# Patient Record
Sex: Male | Born: 1935 | Race: White | Hispanic: No | Marital: Married | Smoking: Former smoker
Health system: Southern US, Community
[De-identification: ages and names within clinical notes are randomized; demographics above are authoritative.]

## PROBLEM LIST (undated history)

## (undated) DIAGNOSIS — I472 Ventricular tachycardia: Secondary | ICD-10-CM

## (undated) DIAGNOSIS — F419 Anxiety disorder, unspecified: Secondary | ICD-10-CM

## (undated) DIAGNOSIS — I739 Peripheral vascular disease, unspecified: Secondary | ICD-10-CM

## (undated) DIAGNOSIS — R0602 Shortness of breath: Secondary | ICD-10-CM

## (undated) DIAGNOSIS — I4729 Other ventricular tachycardia: Secondary | ICD-10-CM

## (undated) DIAGNOSIS — Z9981 Dependence on supplemental oxygen: Secondary | ICD-10-CM

## (undated) DIAGNOSIS — F32A Depression, unspecified: Secondary | ICD-10-CM

## (undated) DIAGNOSIS — N183 Chronic kidney disease, stage 3 unspecified: Secondary | ICD-10-CM

## (undated) DIAGNOSIS — I10 Essential (primary) hypertension: Secondary | ICD-10-CM

## (undated) DIAGNOSIS — I2510 Atherosclerotic heart disease of native coronary artery without angina pectoris: Secondary | ICD-10-CM

## (undated) DIAGNOSIS — K219 Gastro-esophageal reflux disease without esophagitis: Secondary | ICD-10-CM

## (undated) DIAGNOSIS — E785 Hyperlipidemia, unspecified: Secondary | ICD-10-CM

## (undated) DIAGNOSIS — I639 Cerebral infarction, unspecified: Secondary | ICD-10-CM

## (undated) DIAGNOSIS — J449 Chronic obstructive pulmonary disease, unspecified: Secondary | ICD-10-CM

## (undated) DIAGNOSIS — Z9289 Personal history of other medical treatment: Secondary | ICD-10-CM

## (undated) DIAGNOSIS — J189 Pneumonia, unspecified organism: Secondary | ICD-10-CM

## (undated) DIAGNOSIS — K279 Peptic ulcer, site unspecified, unspecified as acute or chronic, without hemorrhage or perforation: Secondary | ICD-10-CM

## (undated) DIAGNOSIS — R55 Syncope and collapse: Secondary | ICD-10-CM

## (undated) DIAGNOSIS — K259 Gastric ulcer, unspecified as acute or chronic, without hemorrhage or perforation: Secondary | ICD-10-CM

## (undated) DIAGNOSIS — J9610 Chronic respiratory failure, unspecified whether with hypoxia or hypercapnia: Secondary | ICD-10-CM

## (undated) DIAGNOSIS — F329 Major depressive disorder, single episode, unspecified: Secondary | ICD-10-CM

## (undated) DIAGNOSIS — M1990 Unspecified osteoarthritis, unspecified site: Secondary | ICD-10-CM

## (undated) HISTORY — PX: EXCISIONAL HEMORRHOIDECTOMY: SHX1541

## (undated) HISTORY — DX: Cerebral infarction, unspecified: I63.9

## (undated) HISTORY — PX: CORONARY ANGIOPLASTY WITH STENT PLACEMENT: SHX49

## (undated) HISTORY — PX: TRANSURETHRAL RESECTION OF PROSTATE: SHX73

## (undated) HISTORY — PX: TONSILLECTOMY: SUR1361

## (undated) HISTORY — PX: CATARACT EXTRACTION W/ INTRAOCULAR LENS  IMPLANT, BILATERAL: SHX1307

## (undated) HISTORY — PX: APPENDECTOMY: SHX54

## (undated) HISTORY — PX: GASTRECTOMY: SHX58

---

## 1998-03-15 ENCOUNTER — Ambulatory Visit (HOSPITAL_COMMUNITY): Admission: RE | Admit: 1998-03-15 | Discharge: 1998-03-15 | Payer: Self-pay | Admitting: Internal Medicine

## 2001-02-04 ENCOUNTER — Ambulatory Visit (HOSPITAL_COMMUNITY): Admission: RE | Admit: 2001-02-04 | Discharge: 2001-02-04 | Payer: Self-pay | Admitting: *Deleted

## 2001-04-27 ENCOUNTER — Ambulatory Visit: Admission: RE | Admit: 2001-04-27 | Discharge: 2001-04-27 | Payer: Self-pay | Admitting: Pulmonary Disease

## 2001-04-27 LAB — PULMONARY FUNCTION TEST

## 2001-05-14 ENCOUNTER — Encounter: Payer: Self-pay | Admitting: Pulmonary Disease

## 2001-05-14 ENCOUNTER — Ambulatory Visit (HOSPITAL_COMMUNITY): Admission: RE | Admit: 2001-05-14 | Discharge: 2001-05-14 | Payer: Self-pay | Admitting: Pulmonary Disease

## 2002-05-13 ENCOUNTER — Emergency Department (HOSPITAL_COMMUNITY): Admission: EM | Admit: 2002-05-13 | Discharge: 2002-05-13 | Payer: Self-pay | Admitting: Emergency Medicine

## 2003-02-23 ENCOUNTER — Encounter: Payer: Self-pay | Admitting: Emergency Medicine

## 2003-02-23 ENCOUNTER — Emergency Department (HOSPITAL_COMMUNITY): Admission: EM | Admit: 2003-02-23 | Discharge: 2003-02-23 | Payer: Self-pay | Admitting: Emergency Medicine

## 2003-03-21 ENCOUNTER — Encounter: Admission: RE | Admit: 2003-03-21 | Discharge: 2003-03-21 | Payer: Self-pay | Admitting: Internal Medicine

## 2003-03-21 ENCOUNTER — Encounter: Payer: Self-pay | Admitting: Internal Medicine

## 2003-04-14 ENCOUNTER — Encounter: Payer: Self-pay | Admitting: Internal Medicine

## 2003-04-14 ENCOUNTER — Encounter: Admission: RE | Admit: 2003-04-14 | Discharge: 2003-04-14 | Payer: Self-pay | Admitting: Internal Medicine

## 2003-11-13 ENCOUNTER — Ambulatory Visit (HOSPITAL_COMMUNITY): Admission: RE | Admit: 2003-11-13 | Discharge: 2003-11-13 | Payer: Self-pay | Admitting: Gastroenterology

## 2004-02-23 ENCOUNTER — Encounter: Admission: RE | Admit: 2004-02-23 | Discharge: 2004-02-23 | Payer: Self-pay | Admitting: Neurosurgery

## 2004-03-08 ENCOUNTER — Emergency Department (HOSPITAL_COMMUNITY): Admission: EM | Admit: 2004-03-08 | Discharge: 2004-03-08 | Payer: Self-pay | Admitting: Emergency Medicine

## 2004-04-04 ENCOUNTER — Ambulatory Visit (HOSPITAL_COMMUNITY): Admission: RE | Admit: 2004-04-04 | Discharge: 2004-04-04 | Payer: Self-pay | Admitting: General Surgery

## 2004-04-06 ENCOUNTER — Emergency Department (HOSPITAL_COMMUNITY): Admission: EM | Admit: 2004-04-06 | Discharge: 2004-04-06 | Payer: Self-pay | Admitting: *Deleted

## 2004-04-19 ENCOUNTER — Encounter: Admission: RE | Admit: 2004-04-19 | Discharge: 2004-04-19 | Payer: Self-pay | Admitting: Neurosurgery

## 2004-04-24 ENCOUNTER — Inpatient Hospital Stay (HOSPITAL_COMMUNITY): Admission: RE | Admit: 2004-04-24 | Discharge: 2004-04-28 | Payer: Self-pay | Admitting: Urology

## 2005-12-12 ENCOUNTER — Encounter: Admission: RE | Admit: 2005-12-12 | Discharge: 2005-12-12 | Payer: Self-pay | Admitting: Internal Medicine

## 2005-12-27 ENCOUNTER — Encounter: Admission: RE | Admit: 2005-12-27 | Discharge: 2005-12-27 | Payer: Self-pay | Admitting: Internal Medicine

## 2006-05-12 ENCOUNTER — Emergency Department (HOSPITAL_COMMUNITY): Admission: EM | Admit: 2006-05-12 | Discharge: 2006-05-12 | Payer: Self-pay | Admitting: Emergency Medicine

## 2006-09-21 ENCOUNTER — Encounter: Admission: RE | Admit: 2006-09-21 | Discharge: 2006-09-21 | Payer: Self-pay | Admitting: Internal Medicine

## 2010-09-08 ENCOUNTER — Encounter: Payer: Self-pay | Admitting: Neurosurgery

## 2011-07-25 ENCOUNTER — Other Ambulatory Visit: Payer: Self-pay

## 2011-07-25 ENCOUNTER — Emergency Department (HOSPITAL_COMMUNITY): Payer: Medicare Other

## 2011-07-25 ENCOUNTER — Encounter: Payer: Self-pay | Admitting: Emergency Medicine

## 2011-07-25 ENCOUNTER — Inpatient Hospital Stay (HOSPITAL_COMMUNITY)
Admission: EM | Admit: 2011-07-25 | Discharge: 2011-08-06 | DRG: 871 | Disposition: A | Discharge status: Skilled Nursing Facility | Payer: Medicare Other | Attending: Internal Medicine | Admitting: Internal Medicine

## 2011-07-25 DIAGNOSIS — R6889 Other general symptoms and signs: Secondary | ICD-10-CM | POA: Diagnosis present

## 2011-07-25 DIAGNOSIS — I2510 Atherosclerotic heart disease of native coronary artery without angina pectoris: Secondary | ICD-10-CM | POA: Diagnosis present

## 2011-07-25 DIAGNOSIS — R5381 Other malaise: Secondary | ICD-10-CM | POA: Diagnosis present

## 2011-07-25 DIAGNOSIS — R55 Syncope and collapse: Secondary | ICD-10-CM | POA: Diagnosis present

## 2011-07-25 DIAGNOSIS — A3211 Listerial meningitis: Secondary | ICD-10-CM | POA: Diagnosis not present

## 2011-07-25 DIAGNOSIS — G9340 Encephalopathy, unspecified: Secondary | ICD-10-CM | POA: Diagnosis present

## 2011-07-25 DIAGNOSIS — I10 Essential (primary) hypertension: Secondary | ICD-10-CM | POA: Diagnosis present

## 2011-07-25 DIAGNOSIS — Z781 Physical restraint status: Secondary | ICD-10-CM | POA: Diagnosis present

## 2011-07-25 DIAGNOSIS — A419 Sepsis, unspecified organism: Principal | ICD-10-CM | POA: Diagnosis present

## 2011-07-25 DIAGNOSIS — R748 Abnormal levels of other serum enzymes: Secondary | ICD-10-CM

## 2011-07-25 DIAGNOSIS — R509 Fever, unspecified: Secondary | ICD-10-CM

## 2011-07-25 DIAGNOSIS — J449 Chronic obstructive pulmonary disease, unspecified: Secondary | ICD-10-CM | POA: Diagnosis present

## 2011-07-25 DIAGNOSIS — R4182 Altered mental status, unspecified: Secondary | ICD-10-CM

## 2011-07-25 DIAGNOSIS — Z79899 Other long term (current) drug therapy: Secondary | ICD-10-CM

## 2011-07-25 DIAGNOSIS — Z681 Body mass index (BMI) 19 or less, adult: Secondary | ICD-10-CM

## 2011-07-25 DIAGNOSIS — Z8711 Personal history of peptic ulcer disease: Secondary | ICD-10-CM

## 2011-07-25 DIAGNOSIS — Z66 Do not resuscitate: Secondary | ICD-10-CM | POA: Diagnosis present

## 2011-07-25 DIAGNOSIS — E876 Hypokalemia: Secondary | ICD-10-CM

## 2011-07-25 DIAGNOSIS — I214 Non-ST elevation (NSTEMI) myocardial infarction: Secondary | ICD-10-CM | POA: Diagnosis present

## 2011-07-25 DIAGNOSIS — G009 Bacterial meningitis, unspecified: Secondary | ICD-10-CM | POA: Diagnosis present

## 2011-07-25 DIAGNOSIS — R64 Cachexia: Secondary | ICD-10-CM | POA: Diagnosis present

## 2011-07-25 DIAGNOSIS — R6510 Systemic inflammatory response syndrome (SIRS) of non-infectious origin without acute organ dysfunction: Secondary | ICD-10-CM | POA: Diagnosis present

## 2011-07-25 DIAGNOSIS — IMO0002 Reserved for concepts with insufficient information to code with codable children: Secondary | ICD-10-CM | POA: Diagnosis present

## 2011-07-25 DIAGNOSIS — Z7982 Long term (current) use of aspirin: Secondary | ICD-10-CM

## 2011-07-25 DIAGNOSIS — J4489 Other specified chronic obstructive pulmonary disease: Secondary | ICD-10-CM | POA: Diagnosis present

## 2011-07-25 HISTORY — DX: Peptic ulcer, site unspecified, unspecified as acute or chronic, without hemorrhage or perforation: K27.9

## 2011-07-25 HISTORY — DX: Syncope and collapse: R55

## 2011-07-25 HISTORY — DX: Essential (primary) hypertension: I10

## 2011-07-25 HISTORY — DX: Atherosclerotic heart disease of native coronary artery without angina pectoris: I25.10

## 2011-07-25 HISTORY — DX: Chronic obstructive pulmonary disease, unspecified: J44.9

## 2011-07-25 LAB — URINALYSIS, ROUTINE W REFLEX MICROSCOPIC
Bilirubin Urine: NEGATIVE
Glucose, UA: NEGATIVE mg/dL
Specific Gravity, Urine: 1.022 (ref 1.005–1.030)
Urobilinogen, UA: 0.2 mg/dL (ref 0.0–1.0)

## 2011-07-25 LAB — LIPASE, BLOOD: Lipase: 15 U/L (ref 11–59)

## 2011-07-25 LAB — CBC
Hemoglobin: 11.5 g/dL — ABNORMAL LOW (ref 13.0–17.0)
MCH: 31.1 pg (ref 26.0–34.0)
MCV: 93.8 fL (ref 78.0–100.0)
WBC: 16.9 10*3/uL — ABNORMAL HIGH (ref 4.0–10.5)

## 2011-07-25 LAB — COMPREHENSIVE METABOLIC PANEL
Alkaline Phosphatase: 54 U/L (ref 39–117)
BUN: 22 mg/dL (ref 6–23)
Chloride: 97 mEq/L (ref 96–112)
Creatinine, Ser: 1.23 mg/dL (ref 0.50–1.35)
GFR calc Af Amer: 62 mL/min — ABNORMAL LOW (ref >90)
GFR calc non Af Amer: 53 mL/min — ABNORMAL LOW (ref >90)
Sodium: 132 mEq/L — ABNORMAL LOW (ref 135–145)
Total Protein: 7.1 g/dL (ref 6.0–8.3)

## 2011-07-25 LAB — URINE MICROSCOPIC-ADD ON

## 2011-07-25 MED ORDER — PIPERACILLIN-TAZOBACTAM 3.375 G IVPB 30 MIN
3.3750 g | Freq: Once | INTRAVENOUS | Status: AC
  Administered 2011-07-25: 3.375 g via INTRAVENOUS
  Filled 2011-07-25: qty 50

## 2011-07-25 MED ORDER — VANCOMYCIN HCL IN DEXTROSE 1-5 GM/200ML-% IV SOLN
1000.0000 mg | Freq: Once | INTRAVENOUS | Status: AC
  Administered 2011-07-25: 1000 mg via INTRAVENOUS
  Filled 2011-07-25: qty 200

## 2011-07-25 MED ORDER — LIDOCAINE HCL 1 % IJ SOLN
INTRAMUSCULAR | Status: AC
  Filled 2011-07-25: qty 20

## 2011-07-25 MED ORDER — POTASSIUM CHLORIDE 10 MEQ/100ML IV SOLN
10.0000 meq | INTRAVENOUS | Status: AC
  Administered 2011-07-25 (×3): 10 meq via INTRAVENOUS
  Filled 2011-07-25 (×3): qty 100

## 2011-07-25 MED ORDER — LIDOCAINE HCL (PF) 1 % IJ SOLN
30.0000 mL | Freq: Once | INTRAMUSCULAR | Status: AC
  Administered 2011-07-26: 30 mL via INTRADERMAL
  Filled 2011-07-25: qty 30

## 2011-07-25 MED ORDER — PIPERACILLIN-TAZOBACTAM 3.375 G IVPB: 3.3750 g | Freq: Once | INTRAVENOUS | Status: DC

## 2011-07-25 MED ORDER — ACETAMINOPHEN 650 MG RE SUPP
650.0000 mg | Freq: Once | RECTAL | Status: AC
  Administered 2011-07-25: 650 mg via RECTAL
  Filled 2011-07-25: qty 1

## 2011-07-25 MED ORDER — SODIUM CHLORIDE 0.9 % IV SOLN: Freq: Once | INTRAVENOUS | Status: DC

## 2011-07-25 MED ORDER — ASPIRIN 300 MG RE SUPP
300.0000 mg | Freq: Once | RECTAL | Status: AC
  Administered 2011-07-25: 300 mg via RECTAL
  Filled 2011-07-25: qty 1

## 2011-07-25 MED ORDER — SODIUM CHLORIDE 0.9 % IV BOLUS (SEPSIS)
1000.0000 mL | Freq: Once | INTRAVENOUS | Status: AC
  Administered 2011-07-25: 1000 mL via INTRAVENOUS

## 2011-07-25 NOTE — ED Provider Notes (Addendum)
History     CSN: [id number] Arrival date & time: 07/25/2011  5:01 PM   First MD Initiated Contact with Patient 07/25/11 1733      Chief Complaint  Patient presents with  . Altered Mental Status    (Consider location/radiation/quality/duration/timing/severity/associated sxs/prior treatment) The history is provided by a relative. The history is limited by the condition of the patient.   patient was brought by his daughter for increasing confusion over the last 3-4 days.  Normally the patient is alert and oriented and able to hold a normal conversation and he lives at home with his wife.  As reported several falls today and increasing confusion as noted by the daughter.  They did report a fever and chills for several days as well.  He's not been out of bed for approximately 3-4 days.  The patient does not abuse alcohol per family.  The patient has a cardiac history with stents in his heart per family.  The patient is unable to provide any history given his degree of confusion and combativeness  Past Medical History  Diagnosis Date  . COPD (chronic obstructive pulmonary disease)   . History of heart surgery     Past Surgical History  Procedure Date  . Cataract extraction   . Abdominal surgery     No family history on file.  History  Substance Use Topics  . Smoking status: Former Games developer  . Smokeless tobacco: Not on file  . Alcohol Use: No      Review of Systems  Unable to perform ROS Psychiatric/Behavioral: Positive for altered mental status.    Allergies  Review of patient's allergies indicates no known allergies.  Home Medications  No current outpatient prescriptions on file.  BP 165/80  Pulse 92  Temp(Src) 102.5 F (39.2 C) (Rectal)  Resp 20  SpO2 91%  Physical Exam  Nursing note and vitals reviewed. Constitutional: He appears well-developed.       Febrile to 104.2 rectally  HENT:  Head: Normocephalic and atraumatic.  Eyes: EOM are normal.  Neck: Normal  range of motion.  Cardiovascular: Normal rate, regular rhythm, normal heart sounds and intact distal pulses.   Pulmonary/Chest: Effort normal and breath sounds normal. No respiratory distress.  Abdominal: Soft. He exhibits no distension.  Genitourinary: Penis normal.  Musculoskeletal: Normal range of motion.  Neurological:       Alert.  Combative.   Does follow simple commands. Normal strength bilaterally in both upper and lower extremities grossly. Unable to perform additional neuro testing given AMS  Skin: Skin is warm and dry.  Psychiatric: He has a normal mood and affect. Judgment normal.    ED Course  LUMBAR PUNCTURE Date/Time: 07/25/2011 11:58 PM Performed by: Lyanne Co Authorized by: Lyanne Co Consent: Verbal consent obtained. Risks and benefits: risks, benefits and alternatives were discussed Consent given by: power of attorney Required items: required blood products, implants, devices, and special equipment available Patient identity confirmed: arm band and hospital-assigned identification number Time out: Immediately prior to procedure a "time out" was called to verify the correct patient, procedure, equipment, support staff and site/side marked as required. Indications: evaluation for infection and evaluation for altered mental status Anesthesia: local infiltration Local anesthetic: lidocaine 1% without epinephrine Anesthetic total: 5 ml Patient sedated: no Lumbar space: L3-L4 interspace Patient's position: left lateral decubitus Needle gauge: 20 Number of attempts: 2 Fluid appearance: clear Tubes of fluid: 4 Total volume: 4 ml Post-procedure: site cleaned Patient tolerance: Patient tolerated the procedure  well with no immediate complications.   (including critical care time)  CRITICAL CARE Performed by: Lyanne Co Total critical care time: 30 Critical care time was exclusive of separately billable procedures and treating other patients. Critical  care was necessary to treat or prevent imminent or life-threatening deterioration. Critical care was time spent personally by me on the following activities: development of treatment plan with patient and/or surrogate as well as nursing, discussions with consultants, evaluation of patient's response to treatment, examination of patient, obtaining history from patient or surrogate, ordering and performing treatments and interventions, ordering and review of laboratory studies, ordering and review of radiographic studies, pulse oximetry and re-evaluation of patient's condition.    Date: 07/25/2011  Rate: 86  Rhythm: normal sinus rhythm  QRS Axis: normal  Intervals: normal  ST/T Wave abnormalities: normal  Conduction Disutrbances:poor baseline, PVC noted  Narrative Interpretation: artifact present with poor baseline  Old EKG Reviewed: nonspecific changes from prior     Labs Reviewed  CBC - Abnormal; Notable for the following:    WBC 16.9 (*)    RBC 3.70 (*)    Hemoglobin 11.5 (*)    HCT 34.7 (*)    Platelets 143 (*)    All other components within normal limits  COMPREHENSIVE METABOLIC PANEL - Abnormal; Notable for the following:    Sodium 132 (*)    Potassium 2.5 (*)    Glucose, Bld 122 (*)    Albumin 3.3 (*)    AST 80 (*)    GFR calc non Af Amer 53 (*)    GFR calc Af Amer 62 (*)    All other components within normal limits  LACTIC ACID, PLASMA - Abnormal; Notable for the following:    Lactic Acid, Venous 2.3 (*)    All other components within normal limits  URINALYSIS, ROUTINE W REFLEX MICROSCOPIC - Abnormal; Notable for the following:    APPearance CLOUDY (*)    Hgb urine dipstick LARGE (*)    Ketones, ur TRACE (*)    Protein, ur 100 (*)    All other components within normal limits  TROPONIN I - Abnormal; Notable for the following:    Troponin I 1.05 (*)    All other components within normal limits  URINE MICROSCOPIC-ADD ON - Abnormal; Notable for the following:    Casts  GRANULAR CAST (*)    All other components within normal limits  LIPASE, BLOOD  PROCALCITONIN  CULTURE, BLOOD (ROUTINE X 2)  CULTURE, BLOOD (ROUTINE X 2)  URINE CULTURE  INFLUENZA PANEL BY PCR   Ct Head Wo Contrast  07/25/2011  *RADIOLOGY REPORT*  Clinical Data: Altered mental status, multiple falls  CT HEAD WITHOUT CONTRAST  Technique:  Contiguous axial images were obtained from the base of the skull through the vertex without contrast.  Comparison: [facility] MRI brain dated 12/27/2005  Findings: Motion degraded images.  No evidence of parenchymal hemorrhage or extra-axial fluid collection. No mass lesion, mass effect, or midline shift.  No CT evidence of acute infarction.  Subcortical white matter and periventricular small vessel ischemic changes.  Age related atrophy. No ventriculomegaly.  The visualized paranasal sinuses are essentially clear. The mastoid air cells are unopacified.  No evidence of calvarial fracture.  IMPRESSION: Motion degraded images.  No evidence of acute intracranial abnormality.  Atrophy with small vessel ischemic changes.  Original Report Authenticated By: Charline Bills, M.D.   Dg Chest Portable 1 View  07/25/2011  *RADIOLOGY REPORT*  Clinical Data: Fever.  Altered  mental status.  PORTABLE CHEST - 1 VIEW  Comparison: 09/21/2006  Findings: Changes of COPD are again noted.  No evidence of acute infiltrate or pleural effusion.  Heart size is normal.  IMPRESSION: COPD.  No acute findings.  Original Report Authenticated By: Danae Orleans, M.D.   I personally reviewed the x-rays  1. Fever   2. Altered mental status   3. Hypokalemia   4. Cardiac enzymes elevated       MDM  Patient presents with a fever off and 4.2.  At this time but not able to find a source.  His vital signs are normal.  His chest x-ray and urine are clear.  The suspicion for this being meningitis is very low as his mental status is improving with antipyretics.  His range of motion of his  neck and no obvious meningeal signs at this time.  CT head is grossly normal.  We'll admit for further evaluation.  Thank and Zosyn given early in the patient's course.  Blood and urine cultures obtained.  The patient does have severe hypokalemia being repleted via IV at this time.  He also has a troponin 1 which is likely a type II myocardial infarction secondary to his infection.  Rectal aspirin has been given.  His EKG is without acute ischemic findings   8:56 PM Spoke with Dr Haroldine Laws who will evaluate the patient in the ER  LP requested by hospitalist after evaluation in the ER.   Lyanne Co, MD 07/25/11 2028  Lyanne Co, MD 07/25/11 1610  Lyanne Co, MD 07/25/11 [phone]

## 2011-07-25 NOTE — H&P (Addendum)
PCP:   Dr. Arna Snipe  Chief Complaint:  Loss of conscious  HPI: This 77 year old gentleman, who has been in bed for the past 3 days with nonspecific illness, today he got up went to the bathroom and passed out. His wife was present, no report of seizures. He laid down for for a while, finally got up to go to the bedroom and they to pass out again. Both times he had glasses in his hands, they broke he sustained bodily abrasions. The patient at that time did not want 911 known to be called. By the time his daughter came home at 4PM, she found her father  confused and talking gibberish. He complained of freezing chills and was very agitated and confused. En route to the hospital the patient tried climbing out of the car window. In the ER the patient's temperature was as high as 104.2 with no clear source. His temperature has improved, he is more oriented but remains weak and spent.   The daughter who provides most of the history reports that patient has had a 40 pound weight loss over the past year  and anorexia. His last colonoscopy was in 2005. He is also been getting progressively weaker. Last week he did have a cough, which seemed to resolve.   The patient does states he had chest pains and states it is not reproducible. Most history obtained from daughter Rosey Bath, patient able to fit in some gaps.   Review of Systems:  positives bolded  The patient denies anorexia, fever, weight loss,, vision loss, decreased hearing, hoarseness, chest pain, syncope, dyspnea on exertion, peripheral edema, balance deficits, hemoptysis, abdominal pain, melena, hematochezia, severe indigestion/heartburn, hematuria, incontinence, genital sores, muscle weakness, suspicious skin lesions, transient blindness, difficulty walking, depression, unusual weight change, abnormal bleeding, enlarged lymph nodes, angioedema, and breast masses.  Past Medical History: Past Medical History  Diagnosis Date  . COPD (chronic  obstructive pulmonary disease)   . CAD (coronary artery disease)   . HTN (hypertension)   . PUD (peptic ulcer disease)    Past Surgical History  Procedure Date  . Cataract extraction   . Gastrectomy   . Coronary angioplasty with stent placement   . Transurethral resection of prostate     Medications: Prior to Admission medications   Medication Sig Start Date End Date Taking? Authorizing Provider  albuterol-ipratropium (COMBIVENT) 18-103 MCG/ACT inhaler Inhale 2 puffs into the lungs every 6 (six) hours as needed.     Yes Historical Provider, MD  aspirin 81 MG tablet Take 81 mg by mouth daily.     Yes Historical Provider, MD  telmisartan (MICARDIS) 80 MG tablet Take 80 mg by mouth daily.     Yes Historical Provider, MD  temazepam (RESTORIL) 30 MG capsule Take 30 mg by mouth at bedtime as needed.     Yes Historical Provider, MD  tiotropium (SPIRIVA) 18 MCG inhalation capsule Place 18 mcg into inhaler and inhale daily.     Yes Historical Provider, MD    Allergies:  No Known Allergies  Social History:  reports that he has quit smoking. He does not have any smokeless tobacco history on file. He reports that he does not drink alcohol or use illicit drugs. lives at home with wife  Family History: Family History  Problem Relation Age of Onset  . Hypertension    . Coronary artery disease      Physical Exam: Filed Vitals:   07/25/11 1711 07/25/11 1752 07/25/11 1955  BP: 144/101  165/80  Pulse: 86 98 92  Temp: 99.7 F (37.6 C) 104.2 F (40.1 C) 102.5 F (39.2 C)  TempSrc: Oral Rectal Rectal  Resp: 20 34 20  SpO2: 94%  91%    General:  Alert and oriented times three, well developed and nourished, no acute distress weak Eyes: PERRLA, pink conjunctiva, no scleral icterus ENT:dry oral mucosa, neck supple, no thyromegaly Lungs: clear to ascultation, no wheeze, no crackles, no use of accessory muscles Cardiovascular: regular rate and rhythm, no regurgitation, no gallops, no  murmurs. No carotid bruits, no JVD Abdomen: soft, positive BS, non-tender, non-distended, no organomegaly, not an acute abdomen GU: not examined Neuro: CN II - XII grossly intact, sensation intact Musculoskeletal: strength 5/5 all extremities, no clubbing, cyanosis or edema Skin: no rash, no subcutaneous crepitation, no decubitus, abrasions and chest wall elbow and torso Psych: appropriate patient   Labs on Admission:   [facility] 07/25/11 1833  NA 132*  K 2.5*  CL 97  CO2 21  GLUCOSE 122*  BUN 22  CREATININE 1.23  CALCIUM 9.2  MG --  PHOS --    Basename 07/25/11 1833  AST 80*  ALT 22  ALKPHOS 54  BILITOT 0.4  PROT 7.1  ALBUMIN 3.3*    Basename 07/25/11 1833  LIPASE 15  AMYLASE --    Basename 07/25/11 1833  WBC 16.9*  NEUTROABS --  HGB 11.5*  HCT 34.7*  MCV 93.8  PLT 143*    Basename 07/25/11 1832  CKTOTAL --  CKMB --  CKMBINDEX --  TROPONINI 1.05*   No results found for this basename: TSH,T4TOTAL,FREET3,T3FREE,THYROIDAB in the last 72 hours No results found for this basename: VITAMINB12:2,FOLATE:2,FERRITIN:2,TIBC:2,IRON:2,RETICCTPCT:2 in the last 72 hours  Radiological Exams on Admission: Ct Head Wo Contrast  07/25/2011  *RADIOLOGY REPORT*  Clinical Data: Altered mental status, multiple falls  CT HEAD WITHOUT CONTRAST  Technique:  Contiguous axial images were obtained from the base of the skull through the vertex without contrast.  Comparison: [facility] MRI brain dated 12/27/2005  Findings: Motion degraded images.  No evidence of parenchymal hemorrhage or extra-axial fluid collection. No mass lesion, mass effect, or midline shift.  No CT evidence of acute infarction.  Subcortical white matter and periventricular small vessel ischemic changes.  Age related atrophy. No ventriculomegaly.  The visualized paranasal sinuses are essentially clear. The mastoid air cells are unopacified.  No evidence of calvarial fracture.  IMPRESSION: Motion degraded images.   No evidence of acute intracranial abnormality.  Atrophy with small vessel ischemic changes.  Original Report Authenticated By: Charline Bills, M.D.   Dg Chest Portable 1 View  07/25/2011  *RADIOLOGY REPORT*  Clinical Data: Fever.  Altered mental status.  PORTABLE CHEST - 1 VIEW  Comparison: 09/21/2006  Findings: Changes of COPD are again noted.  No evidence of acute infiltrate or pleural effusion.  Heart size is normal.  IMPRESSION: COPD.  No acute findings.  Original Report Authenticated By: Danae Orleans, M.D.   EKG; almost sinus rhythm, nonspecific ST segment depression  Assessment/Plan Present on Admission:  .SIRS (systemic inflammatory response syndrome) Encephalopathy  Patient will be admitted, no clear cause for infection identified. Rapid flu ordered, an LP will be done  Blood cultures urine cultures and chest x-ray ordered Antibiotics ordered IV fluid hydration  Patient encephalopathy appears to be improving, may have been due to the elevated temperature  .Elevated troponin Admit to telemetry  Patient with nonspecific ST segment depression, he does complain of some left-sided chest pains and  history of cardiac disease  We'll cycle cardiac enzymes  Aspirin, Lovenox DVT prophylaxis and lipid panel in a.m. ordered  Prophylactic dose DVT as patient will receive an LP in the ER. Marland KitchenSyncope and collapse Likely due to patient's illness, monitor Hypokalemia Check magnesium level Replete with IV fluids Weight loss Tumor markers ordered. Patient has a history of elevated PSA and TURP Check prealbumin   DO NOT RESUSCITATE DVT/GI prophylaxis Team 5/Dr. Brien Few    time in; 9:25 p.m.  Time out ; 10:02 PM   Kynlie Jane 07/25/2011, 9:46 PM

## 2011-07-25 NOTE — ED Notes (Signed)
Pt presenting to ed with c/o altered mental status per daughter at bedside. Per family at bedside pt is normally alert and oriented. Per family pt with multiple falls today and lacerations. Pt is disoriented.

## 2011-07-26 ENCOUNTER — Encounter (HOSPITAL_COMMUNITY): Payer: Self-pay | Admitting: Internal Medicine

## 2011-07-26 DIAGNOSIS — A3211 Listerial meningitis: Secondary | ICD-10-CM | POA: Diagnosis not present

## 2011-07-26 DIAGNOSIS — G009 Bacterial meningitis, unspecified: Secondary | ICD-10-CM

## 2011-07-26 DIAGNOSIS — I214 Non-ST elevation (NSTEMI) myocardial infarction: Secondary | ICD-10-CM

## 2011-07-26 LAB — CSF CELL COUNT WITH DIFFERENTIAL
Eosinophils, CSF: 0 % (ref 0–1)
Segmented Neutrophils-CSF: 94 % — ABNORMAL HIGH (ref 0–6)
WBC, CSF: 641 /mm3 (ref 0–5)

## 2011-07-26 LAB — INFLUENZA PANEL BY PCR (TYPE A & B): H1N1 flu by pcr: NOT DETECTED

## 2011-07-26 LAB — BASIC METABOLIC PANEL
BUN: 21 mg/dL (ref 6–23)
CO2: 18 mEq/L — ABNORMAL LOW (ref 19–32)
Chloride: 101 mEq/L (ref 96–112)
Glucose, Bld: 108 mg/dL — ABNORMAL HIGH (ref 70–99)
Potassium: 2.8 mEq/L — ABNORMAL LOW (ref 3.5–5.1)

## 2011-07-26 MED ORDER — ONDANSETRON HCL 4 MG/2ML IJ SOLN: 4.0000 mg | Freq: Four times a day (QID) | INTRAMUSCULAR | Status: DC | PRN

## 2011-07-26 MED ORDER — POTASSIUM CHLORIDE CRYS ER 20 MEQ PO TBCR: 40.0000 meq | EXTENDED_RELEASE_TABLET | Freq: Once | ORAL | Status: DC

## 2011-07-26 MED ORDER — LORAZEPAM 2 MG/ML IJ SOLN
INTRAMUSCULAR | Status: AC
  Administered 2011-07-26: 1 mg via INTRAVENOUS
  Filled 2011-07-26: qty 1

## 2011-07-26 MED ORDER — ONDANSETRON HCL 4 MG PO TABS: 4.0000 mg | ORAL_TABLET | Freq: Four times a day (QID) | ORAL | Status: DC | PRN

## 2011-07-26 MED ORDER — LORAZEPAM 2 MG/ML IJ SOLN
1.0000 mg | INTRAMUSCULAR | Status: DC | PRN
  Administered 2011-07-26 – 2011-08-03 (×6): 1 mg via INTRAVENOUS
  Filled 2011-07-26 (×5): qty 1

## 2011-07-26 MED ORDER — POTASSIUM CHLORIDE IN NACL 20-0.9 MEQ/L-% IV SOLN
INTRAVENOUS | Status: DC
  Administered 2011-07-26 – 2011-08-06 (×16): via INTRAVENOUS
  Filled 2011-07-26 (×23): qty 1000

## 2011-07-26 MED ORDER — DEXTROSE 5 % IV SOLN
2.0000 g | Freq: Two times a day (BID) | INTRAVENOUS | Status: DC
  Administered 2011-07-26 – 2011-07-28 (×5): 2 g via INTRAVENOUS
  Filled 2011-07-26 (×7): qty 2

## 2011-07-26 MED ORDER — DEXAMETHASONE SODIUM PHOSPHATE 10 MG/ML IJ SOLN
10.0000 mg | Freq: Four times a day (QID) | INTRAMUSCULAR | Status: DC
  Administered 2011-07-26 – 2011-07-28 (×8): 10 mg via INTRAVENOUS
  Filled 2011-07-26 (×15): qty 1

## 2011-07-26 MED ORDER — MORPHINE SULFATE 2 MG/ML IJ SOLN: 1.0000 mg | INTRAMUSCULAR | Status: DC | PRN

## 2011-07-26 MED ORDER — ASPIRIN EC 81 MG PO TBEC
81.0000 mg | DELAYED_RELEASE_TABLET | Freq: Every day | ORAL | Status: DC
  Filled 2011-07-26: qty 1

## 2011-07-26 MED ORDER — ACETAMINOPHEN 650 MG RE SUPP
650.0000 mg | Freq: Four times a day (QID) | RECTAL | Status: DC | PRN
  Administered 2011-07-26 – 2011-07-27 (×3): 650 mg via RECTAL
  Filled 2011-07-26 (×3): qty 1

## 2011-07-26 MED ORDER — ALUM & MAG HYDROXIDE-SIMETH 200-200-20 MG/5ML PO SUSP: 30.0000 mL | Freq: Four times a day (QID) | ORAL | Status: DC | PRN

## 2011-07-26 MED ORDER — POTASSIUM CHLORIDE 10 MEQ/100ML IV SOLN
10.0000 meq | INTRAVENOUS | Status: AC
  Administered 2011-07-26 (×3): 10 meq via INTRAVENOUS
  Filled 2011-07-26 (×2): qty 100

## 2011-07-26 MED ORDER — SODIUM CHLORIDE 0.9 % IV SOLN
2.0000 g | INTRAVENOUS | Status: DC
  Administered 2011-07-26 – 2011-08-06 (×65): 2 g via INTRAVENOUS
  Filled 2011-07-26 (×76): qty 2000

## 2011-07-26 MED ORDER — ACETAMINOPHEN 325 MG PO TABS: 650.0000 mg | ORAL_TABLET | Freq: Four times a day (QID) | ORAL | Status: DC | PRN

## 2011-07-26 MED ORDER — VANCOMYCIN HCL IN DEXTROSE 1-5 GM/200ML-% IV SOLN
1000.0000 mg | INTRAVENOUS | Status: DC
  Administered 2011-07-27 (×2): 1000 mg via INTRAVENOUS
  Filled 2011-07-26 (×3): qty 200

## 2011-07-26 MED ORDER — HYDROCODONE-ACETAMINOPHEN 5-325 MG PO TABS: 1.0000 | ORAL_TABLET | ORAL | Status: DC | PRN

## 2011-07-26 NOTE — ED Notes (Addendum)
Family wants to update contact information:  Gadge Hermiz Wife, Cell: [phone]  Towanda Octave, Daughter Cell: [phone] Work: [phone] ext [phone]  If patient is discharge, daughter Tawny Hopping needs to be contacted first because wife does not have transportation.

## 2011-07-26 NOTE — Consult Note (Signed)
Infectious Diseases Initial Consultation                Day 1 vancomycin        Day 1 piperacillin-tazobactam         Date of Admission:  07/25/2011  Date of Consult:  07/26/2011  Reason for Consult: Meningitis Referring Physician: Dr. Mauro Kaufmann   Problem List:  Principal Problem:  *Syncope and collapse Active Problems:  Meningitis  SIRS (systemic inflammatory response syndrome)  COPD (chronic obstructive pulmonary disease)  CAD (coronary artery disease)  Elevated troponin   Recommendations: 1. Continue vancomycin and change piperacillin-tazobactam to ampicillin and ceftriaxone 2. Add dexamethasone pending CSF and blood cultures   Assessment: Mr. Ybarbo has meningitis and the CSF analyses suggest that it is bacterial meningitis. I've reviewed the Gram stain shows white blood cells but no organisms. His head CT scan does not show any sinusitis or other focal abnormalities. 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 I will continue vancomycin and change piperacillin-tazobactam to ampicillin and ceftriaxone. I will also add adjunctive dexamethasone pending the results of blood and CSF cultures.   HPI: Alec Walker is a 77 y.o. male who is being admitted today because of recurrent falls, fever, chills and confusion. He is in [facility] emergency department. No family or present at the current time. He is confused. The emergency department notes indicate that his wife had brought to the hospital late last night after he fell at home and became confused. Is also history of 40 pound weight loss over the past year but I do not have other details.   Review of Systems: Review of systems not obtained due to patient factors.     Marland Kitchen acetaminophen  650 mg Rectal Once  . aspirin  300 mg Rectal Once  . lidocaine  30 mL Intradermal Once  . piperacillin-tazobactam  3.375 g Intravenous  Once  . potassium chloride  10 mEq Intravenous Q1 Hr x 3  . sodium chloride  1,000 mL Intravenous Once  . vancomycin  1,000 mg Intravenous Once  . DISCONTD: sodium chloride   Intravenous Once  . DISCONTD: piperacillin-tazobactam (ZOSYN)  IV  3.375 g Intravenous Once    Past Medical History  Diagnosis Date  . COPD (chronic obstructive pulmonary disease)   . CAD (coronary artery disease)   . HTN (hypertension)   . PUD (peptic ulcer disease)   . Syncope 07/25/11    x 2 today    History  Substance Use Topics  . Smoking status: Former Games developer  . Smokeless tobacco: Not on file  . Alcohol Use: No     hx etoh abuse in past, daughter states quit over 20 years ago    Family History  Problem Relation Age of Onset  . Hypertension    . Coronary artery disease    . Stroke Mother    No Known Allergies  OBJECTIVE: Blood pressure 124/66, pulse 62, temperature 101.5 F (38.6 C), temperature source Rectal, resp. rate 26, SpO2 90.00%. General: He is cachectic. He is calm upon my entering the room but when spoken to he starts to mumble unintelligibly. Skin: He has multiple abrasions on his forehead, over the bridge of his nose and on his legs. He has multiple large bruises on his arms Lungs: He has a loose cough but his lungs are clear Cor: Has distant heart sounds with a regular S1 and S2.  I do not here any murmurs Abdomen: He has an old midline surgical scar. I cannot feel a liver spleen or any other masses He appears confused but will follow some commands. He will grip weakly with both hands. He is moving all extremities.   Results for orders placed during the hospital encounter of 07/25/11 (from the past 48 hour(s))  LACTIC ACID, PLASMA     Status: Abnormal   Collection Time   07/25/11  6:28 PM      Component Value Range Comment   Lactic Acid, Venous 2.3 (*) 0.5 - 2.2 (mmol/L)   URINALYSIS, ROUTINE W REFLEX MICROSCOPIC     Status: Abnormal   Collection Time   07/25/11  6:31 PM       Component Value Range Comment   Color, Urine YELLOW  YELLOW     APPearance CLOUDY (*) CLEAR     Specific Gravity, Urine 1.022  1.005 - 1.030     pH 5.0  5.0 - 8.0     Glucose, UA NEGATIVE  NEGATIVE (mg/dL)    Hgb urine dipstick LARGE (*) NEGATIVE     Bilirubin Urine NEGATIVE  NEGATIVE     Ketones, ur TRACE (*) NEGATIVE (mg/dL)    Protein, ur 161 (*) NEGATIVE (mg/dL)    Urobilinogen, UA 0.2  0.0 - 1.0 (mg/dL)    Nitrite NEGATIVE  NEGATIVE     Leukocytes, UA NEGATIVE  NEGATIVE    URINE MICROSCOPIC-ADD ON     Status: Abnormal   Collection Time   07/25/11  6:31 PM      Component Value Range Comment   Squamous Epithelial / LPF RARE  RARE     WBC, UA 0-2  <3 (WBC/hpf)    RBC / HPF 3-6  <3 (RBC/hpf)    Bacteria, UA RARE  RARE     Casts GRANULAR CAST (*) NEGATIVE     Urine-Other AMORPHOUS URATES/PHOSPHATES     TROPONIN I     Status: Abnormal   Collection Time   07/25/11  6:32 PM      Component Value Range Comment   Troponin I 1.05 (*) <0.30 (ng/mL)   CBC     Status: Abnormal   Collection Time   07/25/11  6:33 PM      Component Value Range Comment   WBC 16.9 (*) 4.0 - 10.5 (K/uL)    RBC 3.70 (*) 4.22 - 5.81 (MIL/uL)    Hemoglobin 11.5 (*) 13.0 - 17.0 (g/dL)    HCT 09.6 (*) 04.5 - 52.0 (%)    MCV 93.8  78.0 - 100.0 (fL)    MCH 31.1  26.0 - 34.0 (pg)    MCHC 33.1  30.0 - 36.0 (g/dL)    RDW 40.9  81.1 - 91.4 (%)    Platelets 143 (*) 150 - 400 (K/uL)   COMPREHENSIVE METABOLIC PANEL     Status: Abnormal   Collection Time   07/25/11  6:33 PM      Component Value Range Comment   Sodium 132 (*) 135 - 145 (mEq/L)    Potassium 2.5 (*) 3.5 - 5.1 (mEq/L)    Chloride 97  96 - 112 (mEq/L)    CO2 21  19 - 32 (mEq/L)    Glucose, Bld 122 (*) 70 - 99 (mg/dL)    BUN 22  6 - 23 (mg/dL)    Creatinine, Ser 7.82  0.50 - 1.35 (mg/dL)    Calcium 9.2  8.4 - 10.5 (mg/dL)    Total  Protein 7.1  6.0 - 8.3 (g/dL)    Albumin 3.3 (*) 3.5 - 5.2 (g/dL)    AST 80 (*) 0 - 37 (U/L)    ALT 22  0 - 53 (U/L)     Alkaline Phosphatase 54  39 - 117 (U/L)    Total Bilirubin 0.4  0.3 - 1.2 (mg/dL)    GFR calc non Af Amer 53 (*) >90 (mL/min)    GFR calc Af Amer 62 (*) >90 (mL/min)   LIPASE, BLOOD     Status: Normal   Collection Time   07/25/11  6:33 PM      Component Value Range Comment   Lipase 15  11 - 59 (U/L)   PROCALCITONIN     Status: Normal   Collection Time   07/25/11  6:33 PM      Component Value Range Comment   Procalcitonin 4.91     CSF CELL COUNT WITH DIFFERENTIAL     Status: Abnormal   Collection Time   07/25/11 11:59 PM      Component Value Range Comment   Tube # 1      Color, CSF COLORLESS  COLORLESS     Appearance, CSF HAZY (*) CLEAR     Supernatant NOT INDICATED      RBC Count, CSF 27 (*) 0 (/cu mm)    WBC, CSF 641 (*) 0 - 5 (/cu mm)    Segmented Neutrophils-CSF 81 (*) 0 - 6 (%)    Lymphs, CSF 13 (*) 40 - 80 (%)    Monocyte-Macrophage-Spinal Fluid 6 (*) 15 - 45 (%)    Eosinophils, CSF 0  0 - 1 (%)   CSF CELL COUNT WITH DIFFERENTIAL     Status: Abnormal   Collection Time   07/25/11 11:59 PM      Component Value Range Comment   Tube # 4      Color, CSF COLORLESS  COLORLESS     Appearance, CSF HAZY (*) CLEAR     Supernatant NOT INDICATED      RBC Count, CSF 43 (*) 0 (/cu mm)    WBC, CSF 836 (*) 0 - 5 (/cu mm)    Segmented Neutrophils-CSF 94 (*) 0 - 6 (%)    Lymphs, CSF 4 (*) 40 - 80 (%)    Monocyte-Macrophage-Spinal Fluid 2 (*) 15 - 45 (%)    Eosinophils, CSF 0  0 - 1 (%)   GLUCOSE, CSF     Status: Abnormal   Collection Time   07/25/11 11:59 PM      Component Value Range Comment   Glucose, CSF 17 (*) 43 - 76 (mg/dL)   PROTEIN, CSF     Status: Abnormal   Collection Time   07/25/11 11:59 PM      Component Value Range Comment   Total  Protein, CSF 264 (*) 15 - 45 (mg/dL) REPEATED TO VERIFY   No results found for this basename: sdes, specrequest, cult, reptstatus   Ct Head Wo Contrast  07/25/2011  *RADIOLOGY REPORT*  Clinical Data: Altered mental status, multiple falls   CT HEAD WITHOUT CONTRAST  Technique:  Contiguous axial images were obtained from the base of the skull through the vertex without contrast.  Comparison: [facility] MRI brain dated 12/27/2005  Findings: Motion degraded images.  No evidence of parenchymal hemorrhage or extra-axial fluid collection. No mass lesion, mass effect, or midline shift.  No CT evidence of acute infarction.  Subcortical white matter and periventricular small vessel  ischemic changes.  Age related atrophy. No ventriculomegaly.  The visualized paranasal sinuses are essentially clear. The mastoid air cells are unopacified.  No evidence of calvarial fracture.  IMPRESSION: Motion degraded images.  No evidence of acute intracranial abnormality.  Atrophy with small vessel ischemic changes.  Original Report Authenticated By: Charline Bills, M.D.   Dg Chest Portable 1 View  07/25/2011  *RADIOLOGY REPORT*  Clinical Data: Fever.  Altered mental status.  PORTABLE CHEST - 1 VIEW  Comparison: 09/21/2006  Findings: Changes of COPD are again noted.  No evidence of acute infiltrate or pleural effusion.  Heart size is normal.  IMPRESSION: COPD.  No acute findings.  Original Report Authenticated By: Danae Orleans, M.D.    Svetlana Bagby [facility] for Infectious Diseases [phone] 07/26/2011, 10:17 AM

## 2011-07-26 NOTE — Progress Notes (Signed)
Called with result of elevated troponin 3.5. Case discussed with cardiologist on call, in the setting of elevated CK and recent falls with no new EKG changes, its not thought to be due cardiac ischemia. Cardiology will f/u in AM.

## 2011-07-26 NOTE — Progress Notes (Signed)
ANTIBIOTIC CONSULT NOTE - INITIAL  Pharmacy Consult for Vancomycin Indication: Meningitis  No Known Allergies  Patient Measurements:   Per RN estimate: Height 69 inches, 130 pounds  Vital Signs: Temp: 101.5 F (38.6 C) (12/08 0852) Temp src: Rectal (12/08 0852) BP: 124/66 mmHg (12/08 0738) Pulse Rate: 62  (12/08 0738) Intake/Output from previous day: 12/07 0701 - 12/08 0700 In: -  Out: 1 [Stool:1] Intake/Output from this shift:    Labs:  Basename 07/26/11 0945 07/25/11 1833  WBC -- 16.9*  HGB -- 11.5*  PLT -- 143*  LABCREA -- --  CREATININE 1.20 1.23   CrCl is unknown because there is no height on file for the current visit. CrCl estimate, based on ht/wt estimate is about 39 ml/min  Microbiology: Recent Results (from the past 720 hour(s))  CULTURE, BLOOD (ROUTINE X 2)     Status: Normal (Preliminary result)   Collection Time   07/25/11  6:27 PM      Component Value Range Status Comment   Specimen Description BLOOD LEFT ARM   Final    Special Requests BOTTLES DRAWN AEROBIC AND ANAEROBIC  UNKNOWN   Final    Setup Time [id number]   Final    Culture     Final    Value:        BLOOD CULTURE RECEIVED NO GROWTH TO DATE CULTURE WILL BE HELD FOR 5 DAYS BEFORE ISSUING A FINAL NEGATIVE REPORT   Report Status PENDING   Incomplete   CULTURE, BLOOD (ROUTINE X 2)     Status: Normal (Preliminary result)   Collection Time   07/25/11  6:30 PM      Component Value Range Status Comment   Specimen Description BLOOD RIGHT HAND   Final    Special Requests BOTTLES DRAWN AEROBIC ONLY    Final    Setup Time [id number]   Final    Culture     Final    Value:        BLOOD CULTURE RECEIVED NO GROWTH TO DATE CULTURE WILL BE HELD FOR 5 DAYS BEFORE ISSUING A FINAL NEGATIVE REPORT   Report Status PENDING   Incomplete   CSF CULTURE     Status: Normal (Preliminary result)   Collection Time   07/25/11 11:59 PM      Component Value Range Status Comment   Specimen Description CSF   Final     Special Requests NONE   Final    Gram Stain     Final    Value: CYTOSPIN WBC PRESENT,BOTH PMN AND MONONUCLEAR     NO ORGANISMS SEEN     Gram Stain Report Called to,Read Back By and Verified With: Gram Stain Report Called to,Read Back By and Verified With: DR CAMPBELL @1016  07/26/11 BY KRAWS   Culture PENDING   Incomplete    Report Status PENDING   Incomplete     Medical History: Past Medical History  Diagnosis Date  . COPD (chronic obstructive pulmonary disease)   . CAD (coronary artery disease)   . HTN (hypertension)   . PUD (peptic ulcer disease)   . Syncope 07/25/11    x 2 today    Medications:  Scheduled:    . acetaminophen  650 mg Rectal Once  . ampicillin (OMNIPEN) IV  2 g Intravenous Q4H  . aspirin  300 mg Rectal Once  . cefTRIAXone (ROCEPHIN)  IV  2 g Intravenous Q12H  . dexamethasone  10 mg Intravenous Q6H  . lidocaine  30  mL Intradermal Once  . piperacillin-tazobactam  3.375 g Intravenous Once  . potassium chloride  10 mEq Intravenous Q1 Hr x 3  . potassium chloride  10 mEq Intravenous Q1 Hr x 3  . sodium chloride  1,000 mL Intravenous Once  . vancomycin  1,000 mg Intravenous Once  . vancomycin  1,000 mg Intravenous Q24H  . DISCONTD: sodium chloride   Intravenous Once  . DISCONTD: piperacillin-tazobactam (ZOSYN)  IV  3.375 g Intravenous Once   Anti-infectives     Start     Dose/Rate Route Frequency Ordered Stop   07/26/11 2000   vancomycin (VANCOCIN) IVPB 1000 mg/200 mL premix        1,000 mg 200 mL/hr over 60 Minutes Intravenous Every 24 hours 07/26/11 1103     07/26/11 1200   ampicillin (OMNIPEN) 2 g in sodium chloride 0.9 % 50 mL IVPB        2 g 150 mL/hr over 20 Minutes Intravenous 6 times per day 07/26/11 1038     07/26/11 1100   cefTRIAXone (ROCEPHIN) 2 g in dextrose 5 % 50 mL IVPB        2 g 100 mL/hr over 30 Minutes Intravenous Every 12 hours 07/26/11 1038     07/25/11 1800   vancomycin (VANCOCIN) IVPB 1000 mg/200 mL premix        1,000 mg 200  mL/hr over 60 Minutes Intravenous  Once 07/25/11 1744 07/25/11 2044   07/25/11 1800  piperacillin-tazobactam (ZOSYN) IVPB 3.375 g       3.375 g 100 mL/hr over 30 Minutes Intravenous  Once 07/25/11 1745 07/25/11 1925   07/25/11 1745   piperacillin-tazobactam (ZOSYN) IVPB 3.375 g  Status:  Discontinued        3.375 g 12.5 mL/hr over 240 Minutes Intravenous  Once 07/25/11 1744 07/25/11 1744         Assessment: 77 yo M admit with probable bacterial meningitis.  CSF with high WBC, no organisms. First doses of vanc and zosyn given in ED on 12/7, since changed to ampicillin and ceftriaxone per the physician.  Goal of Therapy:  Vancomycin trough level 15-20 mcg/ml  Plan:  Vancomycin 1g IV Q24 hours Adjust dose based on renal function and height and weight information when updated. Measure antibiotic drug levels at steady state Follow up culture results  Lynann Beaver PharmD  Pager [phone] 07/26/2011 11:07 AM

## 2011-07-26 NOTE — ED Notes (Signed)
Patient is resting comfortably. Restrains were removed and EDP was notified

## 2011-07-26 NOTE — Consult Note (Signed)
Referring Physician: [facility] Primary Cardiologist: None Reason for Consultation: NSTEMI  HPI: Alec Walker is a 77 y.o. male with h/o COPD,HTN, PUD, CAD with reported h/o previous stent placement (no record of this in E-chart or EPIC) and recent 40 pound weigh loss. Presented to [facility] ER yesterday with recurrent falls, fever, chills and confusion. Currently no family at bedside and patient confused so no reliable h/o available.    The emergency department notes indicate that his wife had brought to the hospital late last night after he fell at home and became confused. He was subsequently found to have bacterial meningitis with fevers up to 104.5 and delirium.   As part of the work-up cardiac markers were drawn with CK 7231 MB 13.5 and Trop 3.65. ECG with SR and no signiifcant ST-T changes.   Review of Systems: Review of systems not obtained due to patient factors.     Marland Kitchen ampicillin (OMNIPEN) IV  2 g Intravenous Q4H  . cefTRIAXone (ROCEPHIN)  IV  2 g Intravenous Q12H  . dexamethasone  10 mg Intravenous Q6H  . lidocaine  30 mL Intradermal Once  . potassium chloride  10 mEq Intravenous Q1 Hr x 3  . potassium chloride  10 mEq Intravenous Q1 Hr x 3  . potassium chloride  40 mEq Oral Once  . vancomycin  1,000 mg Intravenous Q24H  . DISCONTD: sodium chloride   Intravenous Once    Past Medical History  Diagnosis Date  . COPD (chronic obstructive pulmonary disease)   . CAD (coronary artery disease)   . HTN (hypertension)   . PUD (peptic ulcer disease)   . Syncope 07/25/11    x 2 today    History  Substance Use Topics  . Smoking status: Former Games developer  . Smokeless tobacco: Not on file  . Alcohol Use: No     hx etoh abuse in past, daughter states quit over 20 years ago    Family History  Problem Relation Age of Onset  . Hypertension    . Coronary artery disease    . Stroke Mother    No Known Allergies  OBJECTIVE: Blood pressure 149/84, pulse 90, temperature 100.5  F (38.1 C), temperature source Rectal, resp. rate 28, SpO2 98.00%. General: He is cachectic. He is calm upon my entering the room but when spoken to he starts to mumble. Hard to understand. Can follow some command but is confused HEENT: normal except for poor dentition, dry MMs, and abrasions Skin: He has multiple abrasions on his forehead, over the bridge of his nose and on his legs. He has multiple large bruises on his arms Cor: Distant. Regular. No obvious murmurs or gallops Lungs: rhonchorous Abdomen: He has an old midline surgical scar. I cannot feel a liver spleen or any other masses. No bruits He appears confused but will follow some commands. He will grip weakly with both hands. He is moving all extremities.   Results for orders placed during the hospital encounter of 07/25/11 (from the past 48 hour(s))  CULTURE, BLOOD (ROUTINE X 2)     Status: Normal (Preliminary result)   Collection Time   07/25/11  6:27 PM      Component Value Range Comment   Specimen Description BLOOD LEFT ARM      Special Requests BOTTLES DRAWN AEROBIC AND ANAEROBIC  UNKNOWN      Setup Time [id number]      Culture        Value:  BLOOD CULTURE RECEIVED NO GROWTH TO DATE CULTURE WILL BE HELD FOR 5 DAYS BEFORE ISSUING A FINAL NEGATIVE REPORT   Report Status PENDING     LACTIC ACID, PLASMA     Status: Abnormal   Collection Time   07/25/11  6:28 PM      Component Value Range Comment   Lactic Acid, Venous 2.3 (*) 0.5 - 2.2 (mmol/L)   CULTURE, BLOOD (ROUTINE X 2)     Status: Normal (Preliminary result)   Collection Time   07/25/11  6:30 PM      Component Value Range Comment   Specimen Description BLOOD RIGHT HAND      Special Requests BOTTLES DRAWN AEROBIC ONLY       Setup Time [id number]      Culture        Value:        BLOOD CULTURE RECEIVED NO GROWTH TO DATE CULTURE WILL BE HELD FOR 5 DAYS BEFORE ISSUING A FINAL NEGATIVE REPORT   Report Status PENDING     URINALYSIS, ROUTINE W REFLEX  MICROSCOPIC     Status: Abnormal   Collection Time   07/25/11  6:31 PM      Component Value Range Comment   Color, Urine YELLOW  YELLOW     APPearance CLOUDY (*) CLEAR     Specific Gravity, Urine 1.022  1.005 - 1.030     pH 5.0  5.0 - 8.0     Glucose, UA NEGATIVE  NEGATIVE (mg/dL)    Hgb urine dipstick LARGE (*) NEGATIVE     Bilirubin Urine NEGATIVE  NEGATIVE     Ketones, ur TRACE (*) NEGATIVE (mg/dL)    Protein, ur 811 (*) NEGATIVE (mg/dL)    Urobilinogen, UA 0.2  0.0 - 1.0 (mg/dL)    Nitrite NEGATIVE  NEGATIVE     Leukocytes, UA NEGATIVE  NEGATIVE    URINE MICROSCOPIC-ADD ON     Status: Abnormal   Collection Time   07/25/11  6:31 PM      Component Value Range Comment   Squamous Epithelial / LPF RARE  RARE     WBC, UA 0-2  <3 (WBC/hpf)    RBC / HPF 3-6  <3 (RBC/hpf)    Bacteria, UA RARE  RARE     Casts GRANULAR CAST (*) NEGATIVE     Urine-Other AMORPHOUS URATES/PHOSPHATES     TROPONIN I     Status: Abnormal   Collection Time   07/25/11  6:32 PM      Component Value Range Comment   Troponin I 1.05 (*) <0.30 (ng/mL)   CBC     Status: Abnormal   Collection Time   07/25/11  6:33 PM      Component Value Range Comment   WBC 16.9 (*) 4.0 - 10.5 (K/uL)    RBC 3.70 (*) 4.22 - 5.81 (MIL/uL)    Hemoglobin 11.5 (*) 13.0 - 17.0 (g/dL)    HCT 91.4 (*) 78.2 - 52.0 (%)    MCV 93.8  78.0 - 100.0 (fL)    MCH 31.1  26.0 - 34.0 (pg)    MCHC 33.1  30.0 - 36.0 (g/dL)    RDW 95.6  21.3 - 08.6 (%)    Platelets 143 (*) 150 - 400 (K/uL)   COMPREHENSIVE METABOLIC PANEL     Status: Abnormal   Collection Time   07/25/11  6:33 PM      Component Value Range Comment   Sodium 132 (*) 135 - 145 (  mEq/L)    Potassium 2.5 (*) 3.5 - 5.1 (mEq/L)    Chloride 97  96 - 112 (mEq/L)    CO2 21  19 - 32 (mEq/L)    Glucose, Bld 122 (*) 70 - 99 (mg/dL)    BUN 22  6 - 23 (mg/dL)    Creatinine, Ser 1.61  0.50 - 1.35 (mg/dL)    Calcium 9.2  8.4 - 10.5 (mg/dL)    Total Protein 7.1  6.0 - 8.3 (g/dL)    Albumin 3.3  (*) 3.5 - 5.2 (g/dL)    AST 80 (*) 0 - 37 (U/L)    ALT 22  0 - 53 (U/L)    Alkaline Phosphatase 54  39 - 117 (U/L)    Total Bilirubin 0.4  0.3 - 1.2 (mg/dL)    GFR calc non Af Amer 53 (*) >90 (mL/min)    GFR calc Af Amer 62 (*) >90 (mL/min)   LIPASE, BLOOD     Status: Normal   Collection Time   07/25/11  6:33 PM      Component Value Range Comment   Lipase 15  11 - 59 (U/L)   PROCALCITONIN     Status: Normal   Collection Time   07/25/11  6:33 PM      Component Value Range Comment   Procalcitonin 4.91     INFLUENZA PANEL BY PCR     Status: Normal   Collection Time   07/25/11  9:22 PM      Component Value Range Comment   Influenza A By PCR NEGATIVE  NEGATIVE     Influenza B By PCR NEGATIVE  NEGATIVE     H1N1 flu by pcr NOT DETECTED  NOT DETECTED    CSF CELL COUNT WITH DIFFERENTIAL     Status: Abnormal   Collection Time   07/25/11 11:59 PM      Component Value Range Comment   Tube # 1      Color, CSF COLORLESS  COLORLESS     Appearance, CSF HAZY (*) CLEAR     Supernatant NOT INDICATED      RBC Count, CSF 27 (*) 0 (/cu mm)    WBC, CSF 641 (*) 0 - 5 (/cu mm)    Segmented Neutrophils-CSF 81 (*) 0 - 6 (%)    Lymphs, CSF 13 (*) 40 - 80 (%)    Monocyte-Macrophage-Spinal Fluid 6 (*) 15 - 45 (%)    Eosinophils, CSF 0  0 - 1 (%)   CSF CELL COUNT WITH DIFFERENTIAL     Status: Abnormal   Collection Time   07/25/11 11:59 PM      Component Value Range Comment   Tube # 4      Color, CSF COLORLESS  COLORLESS     Appearance, CSF HAZY (*) CLEAR     Supernatant NOT INDICATED      RBC Count, CSF 43 (*) 0 (/cu mm)    WBC, CSF 836 (*) 0 - 5 (/cu mm)    Segmented Neutrophils-CSF 94 (*) 0 - 6 (%)    Lymphs, CSF 4 (*) 40 - 80 (%)    Monocyte-Macrophage-Spinal Fluid 2 (*) 15 - 45 (%)    Eosinophils, CSF 0  0 - 1 (%)   CSF CULTURE     Status: Normal (Preliminary result)   Collection Time   07/25/11 11:59 PM      Component Value Range Comment   Specimen Description CSF      Special  Requests NONE       Gram Stain        Value: CYTOSPIN WBC PRESENT,BOTH PMN AND MONONUCLEAR     NO ORGANISMS SEEN     Gram Stain Report Called to,Read Back By and Verified With: Gram Stain Report Called to,Read Back By and Verified With: DR CAMPBELL @1016  07/26/11 BY KRAWS   Culture NO GROWTH      Report Status PENDING     GLUCOSE, CSF     Status: Abnormal   Collection Time   07/25/11 11:59 PM      Component Value Range Comment   Glucose, CSF 17 (*) 43 - 76 (mg/dL)   PROTEIN, CSF     Status: Abnormal   Collection Time   07/25/11 11:59 PM      Component Value Range Comment   Total  Protein, CSF 264 (*) 15 - 45 (mg/dL) REPEATED TO VERIFY  BASIC METABOLIC PANEL     Status: Abnormal   Collection Time   07/26/11  9:45 AM      Component Value Range Comment   Sodium 134 (*) 135 - 145 (mEq/L)    Potassium 2.8 (*) 3.5 - 5.1 (mEq/L)    Chloride 101  96 - 112 (mEq/L)    CO2 18 (*) 19 - 32 (mEq/L)    Glucose, Bld 108 (*) 70 - 99 (mg/dL)    BUN 21  6 - 23 (mg/dL)    Creatinine, Ser 1.61  0.50 - 1.35 (mg/dL)    Calcium 8.8  8.4 - 10.5 (mg/dL)    GFR calc non Af Amer 55 (*) >90 (mL/min)    GFR calc Af Amer 64 (*) >90 (mL/min)   CARDIAC PANEL(CRET KIN+CKTOT+MB+TROPI)     Status: Abnormal   Collection Time   07/26/11  5:30 PM      Component Value Range Comment   Total CK 7231 (*) 7 - 232 (U/L)    CK, MB 13.5 (*) 0.3 - 4.0 (ng/mL)    Troponin I 3.65 (*) <0.30 (ng/mL)    Relative Index 0.2  0.0 - 2.5    MAGNESIUM     Status: Normal   Collection Time   07/26/11  7:40 PM      Component Value Range Comment   Magnesium 1.9  1.5 - 2.5 (mg/dL)       Component Value Date/Time   SDES CSF 07/25/2011 2359   Ct Head Wo Contrast  07/25/2011  *RADIOLOGY REPORT*  Clinical Data: Altered mental status, multiple falls  CT HEAD WITHOUT CONTRAST  Technique:  Contiguous axial images were obtained from the base of the skull through the vertex without contrast.  Comparison: [facility] MRI brain dated 12/27/2005  Findings:  Motion degraded images.  No evidence of parenchymal hemorrhage or extra-axial fluid collection. No mass lesion, mass effect, or midline shift.  No CT evidence of acute infarction.  Subcortical white matter and periventricular small vessel ischemic changes.  Age related atrophy. No ventriculomegaly.  The visualized paranasal sinuses are essentially clear. The mastoid air cells are unopacified.  No evidence of calvarial fracture.  IMPRESSION: Motion degraded images.  No evidence of acute intracranial abnormality.  Atrophy with small vessel ischemic changes.  Original Report Authenticated By: Charline Bills, M.D.   Dg Chest Portable 1 View  07/25/2011  *RADIOLOGY REPORT*  Clinical Data: Fever.  Altered mental status.  PORTABLE CHEST - 1 VIEW  Comparison: 09/21/2006  Findings: Changes of COPD are again noted.  No evidence of acute  infiltrate or pleural effusion.  Heart size is normal.  IMPRESSION: COPD.  No acute findings.  Original Report Authenticated By: Danae Orleans, M.D.   ECG: SR 90 with RAE. Minimal nonspecific ST abnormalities. No significant change since 2005   ASSESSMENT:  1. Elevated troponin suggestive of NSTEMI, Type II (demand) 2. CK elevation c/w rhabdomyolysis 3. Acute bacterial meningitis 4. Reported h/o CAD with stent placement - no records available 5. COPD 6. Cachexia 7. Acute delirium 8. Hypokalemia  PLAN/DISCUSSION:  Suspect he has had a small NSTEMI due to profound metabolic stress. Currently appears very stable from cardiac perspective and ECG is fine.  Given comorbidities sand acute infection, no role for any invasive work-up. At this point would just provide conservative management for NSTEMI with ASA 81 and treatment of underlying disease. Given h/o COPD and severe systemic illness would hold off on b-blocker for now - can consider once he is more stable. Consider 2-D echo. Will sign-off. Please call with questions.

## 2011-07-26 NOTE — Progress Notes (Signed)
Subjective: Patient seen , admitted with fever, syncope. Patient had LP done which shows elevated WBC, Patient is confused at this time, wife says the confusion started after he developed fever.  Objective: Vital signs in last 24 hours: Temp:  [99.7 F (37.6 C)-104.2 F (40.1 C)] 102.3 F (39.1 C) (12/08 0359) Pulse Rate:  [62-98] 62  (12/08 0738) Resp:  [20-34] 26  (12/08 0738) BP: (124-172)/(48-101) 124/66 mmHg (12/08 0738) SpO2:  [90 %-99 %] 90 % (12/08 0738) Weight change:     Intake/Output from previous day: 12/07 0701 - 12/08 0700 In: -  Out: 1 [Stool:1]     Physical Exam: General: Alert, not oriented x 3 Heart: Regular rate and rhythm, without murmurs, rubs, gallops. Lungs: Clear to auscultation bilaterally. Abdomen: Soft, nontender, nondistended, positive bowel sounds. Extremities: No clubbing cyanosis or edema with positive pedal pulses. Neuro: Confused.    Lab Results: Basic Metabolic Panel:  [facility] 07/25/11 1833  NA 132*  K 2.5*  CL 97  CO2 21  GLUCOSE 122*  BUN 22  CREATININE 1.23  CALCIUM 9.2  MG --  PHOS --   Liver Function Tests:  [facility] 07/25/11 1833  AST 80*  ALT 22  ALKPHOS 54  BILITOT 0.4  PROT 7.1  ALBUMIN 3.3*    Basename 07/25/11 1833  LIPASE 15  AMYLASE --   No results found for this basename: AMMONIA:2 in the last 72 hours CBC:  Basename 07/25/11 1833  WBC 16.9*  NEUTROABS --  HGB 11.5*  HCT 34.7*  MCV 93.8  PLT 143*   Cardiac Enzymes:  Basename 07/25/11 1832  CKTOTAL --  CKMB --  CKMBINDEX --  TROPONINI 1.05*    No results found for this or any previous visit (from the past 240 hour(s)).  Studies/Results: Ct Head Wo Contrast  07/25/2011  *RADIOLOGY REPORT*  Clinical Data: Altered mental status, multiple falls  CT HEAD WITHOUT CONTRAST  Technique:  Contiguous axial images were obtained from the base of the skull through the vertex without contrast.  Comparison: [facility] MRI brain dated  12/27/2005  Findings: Motion degraded images.  No evidence of parenchymal hemorrhage or extra-axial fluid collection. No mass lesion, mass effect, or midline shift.  No CT evidence of acute infarction.  Subcortical white matter and periventricular small vessel ischemic changes.  Age related atrophy. No ventriculomegaly.  The visualized paranasal sinuses are essentially clear. The mastoid air cells are unopacified.  No evidence of calvarial fracture.  IMPRESSION: Motion degraded images.  No evidence of acute intracranial abnormality.  Atrophy with small vessel ischemic changes.  Original Report Authenticated By: Charline Bills, M.D.   Dg Chest Portable 1 View  07/25/2011  *RADIOLOGY REPORT*  Clinical Data: Fever.  Altered mental status.  PORTABLE CHEST - 1 VIEW  Comparison: 09/21/2006  Findings: Changes of COPD are again noted.  No evidence of acute infiltrate or pleural effusion.  Heart size is normal.  IMPRESSION: COPD.  No acute findings.  Original Report Authenticated By: Danae Orleans, M.D.    Medications: Scheduled Meds:   . acetaminophen  650 mg Rectal Once  . aspirin  300 mg Rectal Once  . lidocaine  30 mL Intradermal Once  . piperacillin-tazobactam  3.375 g Intravenous Once  . potassium chloride  10 mEq Intravenous Q1 Hr x 3  . sodium chloride  1,000 mL Intravenous Once  . vancomycin  1,000 mg Intravenous Once  . DISCONTD: sodium chloride   Intravenous Once  . DISCONTD: piperacillin-tazobactam (ZOSYN)  IV  3.375 g Intravenous Once   Continuous Infusions:   . 0.9 % NaCl with KCl 20 mEq / L 75 mL/hr at 07/26/11 0601   PRN Meds:.acetaminophen, acetaminophen, alum & mag hydroxide-simeth, HYDROcodone-acetaminophen, LORazepam, morphine, ondansetron (ZOFRAN) IV, ondansetron  Assessment/Plan:   Meningitis CSF wbc in 836, Glucose 17, findings d/w Dr Orvan Falconer,  He will see the patient and make adjustment in antibiotics.  Encephalopathy Infectious, sec to meningitis Culture csf is  pending Cont Iv antibiotics.   Active Problems:  SIRS (systemic inflammatory response syndrome) Sec to meningitis Continue IV Abx. Prolactin is 4.91   COPD (chronic obstructive pulmonary disease) Stable  CAD (coronary artery disease) Patient has elevated troponin Most likely sec to SIRS. Will follow the cardiac enzymes. EKG shows sinus tachycardia.  Hypokalemia Will follow BMP Potassium was replaced yesterday.      LOS: 1 day   [facility] Pager: [phone] 07/26/2011, 8:48 AM

## 2011-07-26 NOTE — ED Notes (Addendum)
Patient was ordered tray. Tray not given due to LOC and potential aspiration precaution. Sitter at bedside.

## 2011-07-26 NOTE — ED Notes (Signed)
Waiting for tele bed

## 2011-07-26 NOTE — ED Notes (Signed)
Restless slightly agitated mumbles not oriented x4 Infectious MD at bedside

## 2011-07-26 NOTE — Progress Notes (Signed)
LP results suggestive of bacterial meningitis. Patient on antibiotics, await culture results

## 2011-07-27 DIAGNOSIS — I214 Non-ST elevation (NSTEMI) myocardial infarction: Secondary | ICD-10-CM

## 2011-07-27 LAB — BASIC METABOLIC PANEL
BUN: 30 mg/dL — ABNORMAL HIGH (ref 6–23)
Calcium: 9.3 mg/dL (ref 8.4–10.5)
GFR calc Af Amer: 59 mL/min — ABNORMAL LOW (ref >90)
GFR calc non Af Amer: 51 mL/min — ABNORMAL LOW (ref >90)
Glucose, Bld: 135 mg/dL — ABNORMAL HIGH (ref 70–99)
Sodium: 141 mEq/L (ref 135–145)

## 2011-07-27 LAB — LIPID PANEL
HDL: 46 mg/dL (ref >39)
LDL Cholesterol: 96 mg/dL (ref 0–99)
Total CHOL/HDL Ratio: 3.6 RATIO

## 2011-07-27 LAB — AFP TUMOR MARKER: AFP-Tumor Marker: <1.3 ng/mL (ref 0.0–8.0)

## 2011-07-27 LAB — URINE CULTURE: Colony Count: NO GROWTH

## 2011-07-27 LAB — TROPONIN I: Troponin I: 2.37 ng/mL (ref <0.30)

## 2011-07-27 LAB — CBC
Hemoglobin: 12.3 g/dL — ABNORMAL LOW (ref 13.0–17.0)
MCH: 31.9 pg (ref 26.0–34.0)
MCHC: 33.8 g/dL (ref 30.0–36.0)
RDW: 14 % (ref 11.5–15.5)

## 2011-07-27 MED ORDER — ENSURE CLINICAL ST REVIGOR PO LIQD
237.0000 mL | Freq: Two times a day (BID) | ORAL | Status: DC
  Administered 2011-07-27: 237 mL via ORAL
  Administered 2011-07-28 (×2): via ORAL
  Administered 2011-07-29 – 2011-08-05 (×14): 237 mL via ORAL
  Administered 2011-08-06: 1 via ORAL

## 2011-07-27 MED ORDER — PANTOPRAZOLE SODIUM 40 MG PO TBEC
40.0000 mg | DELAYED_RELEASE_TABLET | Freq: Every day | ORAL | Status: DC
  Administered 2011-07-27 – 2011-08-06 (×11): 40 mg via ORAL
  Filled 2011-07-27 (×12): qty 1

## 2011-07-27 MED ORDER — ASPIRIN EC 81 MG PO TBEC
81.0000 mg | DELAYED_RELEASE_TABLET | Freq: Every day | ORAL | Status: DC
  Administered 2011-07-27 – 2011-08-06 (×11): 81 mg via ORAL
  Filled 2011-07-27 (×12): qty 1

## 2011-07-27 MED ORDER — ASPIRIN 325 MG PO TABS
325.0000 mg | ORAL_TABLET | Freq: Every day | ORAL | Status: DC
  Filled 2011-07-27: qty 1

## 2011-07-27 MED ORDER — ENOXAPARIN SODIUM 40 MG/0.4ML ~~LOC~~ SOLN
40.0000 mg | SUBCUTANEOUS | Status: DC
  Administered 2011-07-27 – 2011-08-06 (×11): 40 mg via SUBCUTANEOUS
  Filled 2011-07-27 (×12): qty 0.4

## 2011-07-27 MED ORDER — OLMESARTAN MEDOXOMIL 40 MG PO TABS
40.0000 mg | ORAL_TABLET | Freq: Every day | ORAL | Status: DC
  Administered 2011-07-27 – 2011-08-06 (×11): 40 mg via ORAL
  Filled 2011-07-27 (×11): qty 1

## 2011-07-27 MED ORDER — POTASSIUM CHLORIDE CRYS ER 20 MEQ PO TBCR
20.0000 meq | EXTENDED_RELEASE_TABLET | Freq: Two times a day (BID) | ORAL | Status: AC
  Administered 2011-07-27 – 2011-07-28 (×4): 20 meq via ORAL
  Filled 2011-07-27 (×4): qty 1

## 2011-07-27 NOTE — Progress Notes (Signed)
Subjective: Patient seen , admitted with fever, syncope. Diagnosed with bacterial meningitis, seen by ID. Also had elevated troponin, and cardiology consulted. Objective: Vital signs in last 24 hours: Temp:  [98.1 F (36.7 C)-102.3 F (39.1 C)] 98.1 F (36.7 C) (12/09 0607) Pulse Rate:  [76-98] 76  (12/09 0607) Resp:  [22-32] 22  (12/09 0607) BP: (118-159)/(66-93) 154/79 mmHg (12/09 0607) SpO2:  [96 %-100 %] 97 % (12/09 0607) Weight:  [54 kg (119 lb 0.8 oz)] 119 lb 0.8 oz (54 kg) (12/09 2130) Weight change:  Last BM Date: 07/25/11  Intake/Output from previous day: 12/08 0701 - 12/09 0700 In: 4000 [I.V.:4000] Out: 1300 [Urine:1300]     Physical Exam: General: Alert, not oriented x 3 Heart: Regular rate and rhythm, without murmurs, rubs, gallops. Lungs: Clear to auscultation bilaterally. Abdomen: Soft, nontender, nondistended, positive bowel sounds. Extremities: No clubbing cyanosis or edema with positive pedal pulses. Neuro: Confused.    Lab Results: Basic Metabolic Panel:  Basename 07/27/11 0835 07/26/11 1940 07/26/11 0945  NA 141 -- 134*  K 3.3* -- 2.8*  CL 109 -- 101  CO2 17* -- 18*  GLUCOSE 135* -- 108*  BUN 30* -- 21  CREATININE 1.28 -- 1.20  CALCIUM 9.3 -- 8.8  MG 2.0 1.9 --  PHOS -- -- --   Liver Function Tests:  [facility] 07/25/11 1833  AST 80*  ALT 22  ALKPHOS 54  BILITOT 0.4  PROT 7.1  ALBUMIN 3.3*    Basename 07/25/11 1833  LIPASE 15  AMYLASE --   No results found for this basename: AMMONIA:2 in the last 72 hours CBC:  Basename 07/27/11 0835 07/25/11 1833  WBC 11.9* 16.9*  NEUTROABS -- --  HGB 12.3* 11.5*  HCT 36.4* 34.7*  MCV 94.3 93.8  PLT 176 143*   Cardiac Enzymes:  Basename 07/27/11 0835 07/26/11 1730 07/25/11 1832  CKTOTAL -- 7231* --  CKMB -- 13.5* --  CKMBINDEX -- -- --  TROPONINI 2.37* 3.65* 1.05*    Recent Results (from the past 240 hour(s))  CULTURE, BLOOD (ROUTINE X 2)     Status: Normal (Preliminary result)   Collection Time   07/25/11  6:27 PM      Component Value Range Status Comment   Specimen Description BLOOD LEFT ARM   Final    Special Requests BOTTLES DRAWN AEROBIC AND ANAEROBIC  UNKNOWN   Final    Setup Time [id number]   Final    Culture     Final    Value:        BLOOD CULTURE RECEIVED NO GROWTH TO DATE CULTURE WILL BE HELD FOR 5 DAYS BEFORE ISSUING A FINAL NEGATIVE REPORT   Report Status PENDING   Incomplete   CULTURE, BLOOD (ROUTINE X 2)     Status: Normal (Preliminary result)   Collection Time   07/25/11  6:30 PM      Component Value Range Status Comment   Specimen Description BLOOD RIGHT HAND   Final    Special Requests BOTTLES DRAWN AEROBIC ONLY    Final    Setup Time [id number]   Final    Culture     Final    Value:        BLOOD CULTURE RECEIVED NO GROWTH TO DATE CULTURE WILL BE HELD FOR 5 DAYS BEFORE ISSUING A FINAL NEGATIVE REPORT   Report Status PENDING   Incomplete   URINE CULTURE     Status: Normal   Collection Time   07/25/11  6:31 PM      Component Value Range Status Comment   Specimen Description URINE, CATHETERIZED   Final    Special Requests NONE   Final    Setup Time [id number]   Final    Colony Count NO GROWTH   Final    Culture NO GROWTH   Final    Report Status 07/27/2011 FINAL   Final   CSF CULTURE     Status: Normal (Preliminary result)   Collection Time   07/25/11 11:59 PM      Component Value Range Status Comment   Specimen Description CSF   Final    Special Requests NONE   Final    Gram Stain     Final    Value: CYTOSPIN WBC PRESENT,BOTH PMN AND MONONUCLEAR     NO ORGANISMS SEEN     Gram Stain Report Called to,Read Back By and Verified With: Gram Stain Report Called to,Read Back By and Verified With: DR CAMPBELL @1016  07/26/11 BY KRAWS   Culture NO GROWTH   Final    Report Status PENDING   Incomplete     Studies/Results: Ct Head Wo Contrast  07/25/2011  *RADIOLOGY REPORT*  Clinical Data: Altered mental status, multiple falls  CT HEAD  WITHOUT CONTRAST  Technique:  Contiguous axial images were obtained from the base of the skull through the vertex without contrast.  Comparison: [facility] MRI brain dated 12/27/2005  Findings: Motion degraded images.  No evidence of parenchymal hemorrhage or extra-axial fluid collection. No mass lesion, mass effect, or midline shift.  No CT evidence of acute infarction.  Subcortical white matter and periventricular small vessel ischemic changes.  Age related atrophy. No ventriculomegaly.  The visualized paranasal sinuses are essentially clear. The mastoid air cells are unopacified.  No evidence of calvarial fracture.  IMPRESSION: Motion degraded images.  No evidence of acute intracranial abnormality.  Atrophy with small vessel ischemic changes.  Original Report Authenticated By: Charline Bills, M.D.   Dg Chest Portable 1 View  07/25/2011  *RADIOLOGY REPORT*  Clinical Data: Fever.  Altered mental status.  PORTABLE CHEST - 1 VIEW  Comparison: 09/21/2006  Findings: Changes of COPD are again noted.  No evidence of acute infiltrate or pleural effusion.  Heart size is normal.  IMPRESSION: COPD.  No acute findings.  Original Report Authenticated By: Danae Orleans, M.D.    Medications: Scheduled Meds:    . ampicillin (OMNIPEN) IV  2 g Intravenous Q4H  . aspirin EC  81 mg Oral Daily  . aspirin  325 mg Oral Daily  . cefTRIAXone (ROCEPHIN)  IV  2 g Intravenous Q12H  . dexamethasone  10 mg Intravenous Q6H  . enoxaparin  40 mg Subcutaneous Q24H  . olmesartan  40 mg Oral Daily  . pantoprazole  40 mg Oral Q breakfast  . potassium chloride  10 mEq Intravenous Q1 Hr x 3  . potassium chloride  40 mEq Oral Once  . vancomycin  1,000 mg Intravenous Q24H   Continuous Infusions:    . 0.9 % NaCl with KCl 20 mEq / L 75 mL/hr at 07/27/11 0341   PRN Meds:.acetaminophen, acetaminophen, alum & mag hydroxide-simeth, HYDROcodone-acetaminophen, LORazepam, morphine, ondansetron (ZOFRAN) IV,  ondansetron  Assessment/Plan:   Bacterial Meningitis CSF wbc in 836, Glucose 17,  Cont vancomycin, amicillin, rocephin, decadron. Still confused WBC  Is improving.   NSTEMI Troponin elevated,  Cardiology only recommends medical management No intervention at this time. Cont aspirin 81 mg daily.   Encephalopathy  Infectious, sec to meningitis Cont Iv antibiotics.   SIRS (systemic inflammatory response syndrome) Sec to meningitis Continue IV Abx. Prolactin is 4.91   COPD (chronic obstructive pulmonary disease) Stable    Hypokalemia Will follow BMP Potassium will be replaced today.  Code status DNR   LOS: 2 days   [facility] Pager: [phone] 07/27/2011, 9:35 AM

## 2011-07-27 NOTE — Progress Notes (Signed)
Patient ID: Alec Walker, male   DOB: [birth year], 77 y.o.   MRN: [id number] INFECTIOUS DISEASE PROGRESS NOTE   Date of Admission:  07/25/2011   Day 2 total antibiotics  Day 2 vancomycin Day 2 ceftriaxone Day 2 ampicillin      . ampicillin (OMNIPEN) IV  2 g Intravenous Q4H  . aspirin EC  81 mg Oral Daily  . cefTRIAXone (ROCEPHIN)  IV  2 g Intravenous Q12H  . dexamethasone  10 mg Intravenous Q6H  . enoxaparin  40 mg Subcutaneous Q24H  . olmesartan  40 mg Oral Daily  . pantoprazole  40 mg Oral Q breakfast  . potassium chloride  10 mEq Intravenous Q1 Hr x 3  . potassium chloride  20 mEq Oral BID  . potassium chloride  40 mEq Oral Once  . vancomycin  1,000 mg Intravenous Q24H  . DISCONTD: aspirin EC  81 mg Oral Daily  . DISCONTD: aspirin  325 mg Oral Daily    Subjective: He remains confused  Objective: Temp (24hrs), Avg:100.8 F (38.2 C), Min:98.1 F (36.7 C), Max:102.3 F (39.1 C)    General:  disheveled and confused.  Skin: He has multiple abrasions and bruises. Lungs:  clear Cor:  hard to hear heart sounds is he is mumbling. Abdomen:  soft and not obviously tender   Lab Results Lab Results  Component Value Date   WBC 11.9* 07/27/2011   HGB 12.3* 07/27/2011   HCT 36.4* 07/27/2011   MCV 94.3 07/27/2011   PLT 176 07/27/2011    Lab Results  Component Value Date   CREATININE 1.28 07/27/2011   BUN 30* 07/27/2011   NA 141 07/27/2011   K 3.3* 07/27/2011   CL 109 07/27/2011   CO2 17* 07/27/2011    Lab Results  Component Value Date   ALT 22 07/25/2011   AST 80* 07/25/2011   ALKPHOS 54 07/25/2011   BILITOT 0.4 07/25/2011     Microbiology: Recent Results (from the past 240 hour(s))  CULTURE, BLOOD (ROUTINE X 2)     Status: Normal (Preliminary result)   Collection Time   07/25/11  6:27 PM      Component Value Range Status Comment   Specimen Description BLOOD LEFT ARM   Final    Special Requests BOTTLES DRAWN AEROBIC AND ANAEROBIC  UNKNOWN   Final    Setup Time  [id number]   Final    Culture     Final    Value:        BLOOD CULTURE RECEIVED NO GROWTH TO DATE CULTURE WILL BE HELD FOR 5 DAYS BEFORE ISSUING A FINAL NEGATIVE REPORT   Report Status PENDING   Incomplete   CULTURE, BLOOD (ROUTINE X 2)     Status: Normal (Preliminary result)   Collection Time   07/25/11  6:30 PM      Component Value Range Status Comment   Specimen Description BLOOD RIGHT HAND   Final    Special Requests BOTTLES DRAWN AEROBIC ONLY    Final    Setup Time [id number]   Final    Culture     Final    Value:        BLOOD CULTURE RECEIVED NO GROWTH TO DATE CULTURE WILL BE HELD FOR 5 DAYS BEFORE ISSUING A FINAL NEGATIVE REPORT   Report Status PENDING   Incomplete   URINE CULTURE     Status: Normal   Collection Time   07/25/11  6:31 PM  Component Value Range Status Comment   Specimen Description URINE, CATHETERIZED   Final    Special Requests NONE   Final    Setup Time [id number]   Final    Colony Count NO GROWTH   Final    Culture NO GROWTH   Final    Report Status 07/27/2011 FINAL   Final   CSF CULTURE     Status: Normal (Preliminary result)   Collection Time   07/25/11 11:59 PM      Component Value Range Status Comment   Specimen Description CSF   Final    Special Requests NONE   Final    Gram Stain     Final    Value: CYTOSPIN WBC PRESENT,BOTH PMN AND MONONUCLEAR     NO ORGANISMS SEEN     Gram Stain Report Called to,Read Back By and Verified With: Gram Stain Report Called to,Read Back By and Verified With: DR Oda Lansdowne @1016  07/26/11 BY KRAWS   Culture NO GROWTH   Final    Report Status PENDING   Incomplete     Studies/Results: Ct Head Wo Contrast  07/25/2011  *RADIOLOGY REPORT*  Clinical Data: Altered mental status, multiple falls  CT HEAD WITHOUT CONTRAST  Technique:  Contiguous axial images were obtained from the base of the skull through the vertex without contrast.  Comparison: [facility] MRI brain dated 12/27/2005  Findings: Motion degraded  images.  No evidence of parenchymal hemorrhage or extra-axial fluid collection. No mass lesion, mass effect, or midline shift.  No CT evidence of acute infarction.  Subcortical white matter and periventricular small vessel ischemic changes.  Age related atrophy. No ventriculomegaly.  The visualized paranasal sinuses are essentially clear. The mastoid air cells are unopacified.  No evidence of calvarial fracture.  IMPRESSION: Motion degraded images.  No evidence of acute intracranial abnormality.  Atrophy with small vessel ischemic changes.  Original Report Authenticated By: Charline Bills, M.D.   Dg Chest Portable 1 View  07/25/2011  *RADIOLOGY REPORT*  Clinical Data: Fever.  Altered mental status.  PORTABLE CHEST - 1 VIEW  Comparison: 09/21/2006  Findings: Changes of COPD are again noted.  No evidence of acute infiltrate or pleural effusion.  Heart size is normal.  IMPRESSION: COPD.  No acute findings.  Original Report Authenticated By: Danae Orleans, M.D.     Assessment:  he has bacterial meningitis and may be a little more calm today than he was yesterday in the emergency department. He is still confused and disoriented. Unfortunately the all cultures were obtained after he was given a dose of vancomycin and piperacillin-tazobactam so they may remain falsely negative forcing [place] to continue broad empiric antibiotic therapy.  Plan: 1. Continue current antibiotics pending final culture results. 2. We can stop a droplet precautions now.   [name], MD [facility] [phone] pager   [phone] cell 07/27/2011, 11:06 AM

## 2011-07-27 NOTE — Progress Notes (Signed)
Patient ID: Alec Walker, male   DOB: [birth year], 77 y.o.   MRN: [id number] @ Subjective:  Not coherant mumbling   Objective:  Filed Vitals:   07/27/11 0242 07/27/11 0244 07/27/11 0445 07/27/11 0607  BP: 156/90 156/90 143/87 154/79  Pulse:   90 76  Temp:  102.3 F (39.1 C) 100.1 F (37.8 C) 98.1 F (36.7 C)  TempSrc:  Rectal Rectal Oral  Resp: 25   22  Height:    6' (1.829 m)  Weight:    54 kg (119 lb 0.8 oz)  SpO2:  97% 97% 97%    Intake/Output from previous day:  Intake/Output Summary (Last 24 hours) at 07/27/11 0907 Last data filed at 07/26/11 1724  Gross per 24 hour  Intake   4000 ml  Output   1300 ml  Net   2700 ml    Physical Exam: General appearance: cachectic and no distress Neck: no adenopathy, no carotid bruit, no JVD, supple, symmetrical, trachea midline and thyroid not enlarged, symmetric, no tenderness/mass/nodules Lungs: Rhonchi and poor inspitory effort Heart: regular rate and rhythm, S1, S2 normal, no murmur, click, rub or gallop Abdomen: soft, non-tender; bowel sounds normal; no masses,  no organomegaly Extremities: extremities normal, atraumatic, no cyanosis or edema Pulses: 2+ and symmetric Skin: Skin color, texture, turgor normal. No rashes or lesions or Blisters on arms  Neurologic: Grossly normal  Lab Results: Basic Metabolic Panel:  Basename 07/26/11 1940 07/26/11 0945 07/25/11 1833  NA -- 134* 132*  K -- 2.8* 2.5*  CL -- 101 97  CO2 -- 18* 21  GLUCOSE -- 108* 122*  BUN -- 21 22  CREATININE -- 1.20 1.23  CALCIUM -- 8.8 9.2  MG 1.9 -- --  PHOS -- -- --   Liver Function Tests:  [facility] 07/25/11 1833  AST 80*  ALT 22  ALKPHOS 54  BILITOT 0.4  PROT 7.1  ALBUMIN 3.3*    Basename 07/25/11 1833  LIPASE 15  AMYLASE --   CBC:  Basename 07/27/11 0835 07/25/11 1833  WBC 11.9* 16.9*  NEUTROABS -- --  HGB 12.3* 11.5*  HCT 36.4* 34.7*  MCV 94.3 93.8  PLT 176 143*   Cardiac Enzymes:  Basename 07/26/11 1730 07/25/11 1832    CKTOTAL 7231* --  CKMB 13.5* --  CKMBINDEX -- --  TROPONINI 3.65* 1.05*   BNP: No results found for this basename: POCBNP:3 in the last 72 hours D-Dimer: No results found for this basename: DDIMER:2 in the last 72 hours Hemoglobin A1C: No results found for this basename: HGBA1C in the last 72 hours Fasting Lipid Panel: No results found for this basename: CHOL,HDL,LDLCALC,TRIG,CHOLHDL,LDLDIRECT in the last 72 hours Thyroid Function Tests: No results found for this basename: TSH,T4TOTAL,FREET3,T3FREE,THYROIDAB in the last 72 hours Anemia Panel: No results found for this basename: VITAMINB12,FOLATE,FERRITIN,TIBC,IRON,RETICCTPCT in the last 72 hours  Imaging: Ct Head Wo Contrast  07/25/2011  *RADIOLOGY REPORT*  Clinical Data: Altered mental status, multiple falls  CT HEAD WITHOUT CONTRAST  Technique:  Contiguous axial images were obtained from the base of the skull through the vertex without contrast.  Comparison: [facility] MRI brain dated 12/27/2005  Findings: Motion degraded images.  No evidence of parenchymal hemorrhage or extra-axial fluid collection. No mass lesion, mass effect, or midline shift.  No CT evidence of acute infarction.  Subcortical white matter and periventricular small vessel ischemic changes.  Age related atrophy. No ventriculomegaly.  The visualized paranasal sinuses are essentially clear. The mastoid air cells are unopacified.  No evidence of calvarial fracture.  IMPRESSION: Motion degraded images.  No evidence of acute intracranial abnormality.  Atrophy with small vessel ischemic changes.  Original Report Authenticated By: Charline Bills, M.D.   Dg Chest Portable 1 View  07/25/2011  *RADIOLOGY REPORT*  Clinical Data: Fever.  Altered mental status.  PORTABLE CHEST - 1 VIEW  Comparison: 09/21/2006  Findings: Changes of COPD are again noted.  No evidence of acute infiltrate or pleural effusion.  Heart size is normal.  IMPRESSION: COPD.  No acute findings.   Original Report Authenticated By: Danae Orleans, M.D.    Medications:     . ampicillin (OMNIPEN) IV  2 g Intravenous Q4H  . aspirin EC  81 mg Oral Daily  . aspirin  325 mg Oral Daily  . cefTRIAXone (ROCEPHIN)  IV  2 g Intravenous Q12H  . dexamethasone  10 mg Intravenous Q6H  . enoxaparin  40 mg Subcutaneous Q24H  . olmesartan  40 mg Oral Daily  . pantoprazole  40 mg Oral Q breakfast  . potassium chloride  10 mEq Intravenous Q1 Hr x 3  . potassium chloride  40 mEq Oral Once  . vancomycin  1,000 mg Intravenous Q24H       . 0.9 % NaCl with KCl 20 mEq / L 75 mL/hr at 07/27/11 0341    Assessment/Plan:  SEMI:  Possible.  See note From Dr Teressa Lower  Cardiac status stable.  Primary clinical issues are CNS/Pulm.  Continue antibiotic coverage Agree with DNR.  No further cardiac w/u   Charlton Haws 07/27/2011, 9:07 AM

## 2011-07-27 NOTE — ED Notes (Signed)
Remained confused with some restlessness, report given to [employer], endorsed

## 2011-07-27 NOTE — Progress Notes (Signed)
INITIAL ADULT NUTRITION ASSESSMENT Date: 07/27/2011   Time: 12:31 PM Reason for Assessment: Consult  ASSESSMENT: Male 77 y.o.  Dx: Syncope and collapse, SIRS, bacterial meningitis, hypokalemia  Past Medical History  Diagnosis Date  . COPD (chronic obstructive pulmonary disease)   . CAD (coronary artery disease)   . HTN (hypertension)   . PUD (peptic ulcer disease)   . Syncope 07/25/11    x 2 today    Scheduled Meds:    . ampicillin (OMNIPEN) IV  2 g Intravenous Q4H  . aspirin EC  81 mg Oral Daily  . cefTRIAXone (ROCEPHIN)  IV  2 g Intravenous Q12H  . dexamethasone  10 mg Intravenous Q6H  . enoxaparin  40 mg Subcutaneous Q24H  . olmesartan  40 mg Oral Daily  . pantoprazole  40 mg Oral Q breakfast  . potassium chloride  10 mEq Intravenous Q1 Hr x 3  . potassium chloride  20 mEq Oral BID  . potassium chloride  40 mEq Oral Once  . vancomycin  1,000 mg Intravenous Q24H  . DISCONTD: aspirin EC  81 mg Oral Daily  . DISCONTD: aspirin  325 mg Oral Daily   Continuous Infusions:    . 0.9 % NaCl with KCl 20 mEq / L 75 mL/hr at 07/27/11 0341   PRN Meds:.acetaminophen, acetaminophen, alum & mag hydroxide-simeth, HYDROcodone-acetaminophen, LORazepam, morphine, ondansetron (ZOFRAN) IV, ondansetron   Ht: 6' (182.9 cm)  Wt: 119 lb 0.8 oz (54 kg)  Ideal Wt: 80.9 kg % Ideal Wt: 67%   Body mass index is 16.15 kg/(m^2).  Food/Nutrition Related Hx: Daughter reports 40 lb weight loss over past year (25% wt loss in past year, classified as severe) and anorexia. Pt remains confused per progress note.  Labs:  CMP     Component Value Date/Time   NA 141 07/27/2011 0835   K 3.3* 07/27/2011 0835   CL 109 07/27/2011 0835   CO2 17* 07/27/2011 0835   GLUCOSE 135* 07/27/2011 0835   BUN 30* 07/27/2011 0835   CREATININE 1.28 07/27/2011 0835   CALCIUM 9.3 07/27/2011 0835   PROT 7.1 07/25/2011 1833   ALBUMIN 3.3* 07/25/2011 1833   AST 80* 07/25/2011 1833   ALT 22 07/25/2011 1833   ALKPHOS 54  07/25/2011 1833   BILITOT 0.4 07/25/2011 1833   GFRNONAA 51* 07/27/2011 0835   GFRAA 59* 07/27/2011 0835   Magnesium: 2.0  I/O last 3 completed shifts: In: 4000 [I.V.:4000] Out: 1301 [Urine:1300; Stool:1] Total I/O In: 60 [P.O.:60] Out: -   Diet Order: Cardiac (5% PO intake documented)  Supplements/Tube Feeding: none  IVF:     0.9 % NaCl with KCl 20 mEq / L Last Rate: 75 mL/hr at 07/27/11 0341    Estimated Nutritional Needs:   Kcal: 1600-1800 Protein: 85-95 gm Fluid: 1.6-1.8 L  NUTRITION DIAGNOSIS: -Inadequate oral intake (NI-2.1).  Status: Ongoing  RELATED TO: anorexia  AS EVIDENCE BY: 5% PO intake and 25% wt loss in past year  MONITORING/EVALUATION(Goals): Goal: Pt to consume >50% meals and supplements in order to prevent wt loss. Monitor: Refeeding syndrome, labs, weight changes, PO intake   EDUCATION NEEDS: -Education not appropriate at this time  INTERVENTION: Encourage PO intake at meals. Will order Ensure Clinical Strength BID.    DOCUMENTATION CODES Per approved criteria  -Severe malnutrition in the context of chronic illness -Underweight    Leonette Most 07/27/2011, 12:31 PM

## 2011-07-28 LAB — CBC
HCT: 36.4 % — ABNORMAL LOW (ref 39.0–52.0)
Hemoglobin: 12.4 g/dL — ABNORMAL LOW (ref 13.0–17.0)
MCH: 31.8 pg (ref 26.0–34.0)
MCHC: 34.1 g/dL (ref 30.0–36.0)

## 2011-07-28 LAB — BASIC METABOLIC PANEL
BUN: 34 mg/dL — ABNORMAL HIGH (ref 6–23)
Calcium: 9.3 mg/dL (ref 8.4–10.5)
Creatinine, Ser: 1.34 mg/dL (ref 0.50–1.35)
GFR calc non Af Amer: 48 mL/min — ABNORMAL LOW (ref >90)
Glucose, Bld: 163 mg/dL — ABNORMAL HIGH (ref 70–99)

## 2011-07-28 LAB — PATHOLOGIST SMEAR REVIEW

## 2011-07-28 LAB — HERPES SIMPLEX VIRUS(HSV) DNA BY PCR

## 2011-07-28 NOTE — Progress Notes (Signed)
PT. 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 LAMA STATES PLEASE CALL ID, AND A CALL HAS BEEN PUT IN AWAITING CALL BACK.

## 2011-07-28 NOTE — Progress Notes (Signed)
Subjective: Patient seen , admitted with fever, syncope. Diagnosed with bacterial meningitis, seen by ID. Today patient is more alert and oriented x 2, communicating. Objective: Vital signs in last 24 hours: Temp:  [97.2 F (36.2 C)-97.7 F (36.5 C)] 97.2 F (36.2 C) (12/10 1249) Pulse Rate:  [79-90] 88  (12/10 1249) Resp:  [27-30] 30  (12/10 1249) BP: (148-161)/(75-81) 148/75 mmHg (12/10 1249) SpO2:  [97 %] 97 % (12/10 1249) Weight change:  Last BM Date: 07/27/11  Intake/Output from previous day: 12/09 0701 - 12/10 0700 In: 1205 [P.O.:380; I.V.:825] Out: 1250 [Urine:1250] Total I/O In: 748.8 [P.O.:500; I.V.:198.8; IV Piggyback:50] Out: 350 [Urine:350]   Physical Exam: General: Alert, not oriented x 2 Heart: Regular rate and rhythm, without murmurs, rubs, gallops. Lungs: Clear to auscultation bilaterally. Abdomen: Soft, nontender, nondistended, positive bowel sounds. Extremities: No clubbing cyanosis or edema with positive pedal pulses. Neuro: Confused.    Lab Results: Basic Metabolic Panel:  Basename 07/28/11 0458 07/27/11 0835 07/26/11 1940  NA 146* 141 --  K 4.1 3.3* --  CL 117* 109 --  CO2 20 17* --  GLUCOSE 163* 135* --  BUN 34* 30* --  CREATININE 1.34 1.28 --  CALCIUM 9.3 9.3 --  MG -- 2.0 1.9  PHOS -- -- --   Liver Function Tests:  [facility] 07/25/11 1833  AST 80*  ALT 22  ALKPHOS 54  BILITOT 0.4  PROT 7.1  ALBUMIN 3.3*    Basename 07/25/11 1833  LIPASE 15  AMYLASE --   No results found for this basename: AMMONIA:2 in the last 72 hours CBC:  Basename 07/28/11 0458 07/27/11 0835  WBC 10.6* 11.9*  NEUTROABS -- --  HGB 12.4* 12.3*  HCT 36.4* 36.4*  MCV 93.3 94.3  PLT 172 176   Cardiac Enzymes:  Basename 07/27/11 1625 07/27/11 0835 07/26/11 1730  CKTOTAL -- -- 7231*  CKMB -- -- 13.5*  CKMBINDEX -- -- --  TROPONINI 2.28* 2.37* 3.65*    Recent Results (from the past 240 hour(s))  CULTURE, BLOOD (ROUTINE X 2)     Status: Normal  (Preliminary result)   Collection Time   07/25/11  6:27 PM      Component Value Range Status Comment   Specimen Description BLOOD LEFT ARM   Final    Special Requests BOTTLES DRAWN AEROBIC AND ANAEROBIC  UNKNOWN   Final    Setup Time [id number]   Final    Culture     Final    Value:        BLOOD CULTURE RECEIVED NO GROWTH TO DATE CULTURE WILL BE HELD FOR 5 DAYS BEFORE ISSUING A FINAL NEGATIVE REPORT   Report Status PENDING   Incomplete   CULTURE, BLOOD (ROUTINE X 2)     Status: Normal (Preliminary result)   Collection Time   07/25/11  6:30 PM      Component Value Range Status Comment   Specimen Description BLOOD RIGHT HAND   Final    Special Requests BOTTLES DRAWN AEROBIC ONLY    Final    Setup Time [id number]   Final    Culture     Final    Value:        BLOOD CULTURE RECEIVED NO GROWTH TO DATE CULTURE WILL BE HELD FOR 5 DAYS BEFORE ISSUING A FINAL NEGATIVE REPORT   Report Status PENDING   Incomplete   URINE CULTURE     Status: Normal   Collection Time   07/25/11  6:31 PM  Component Value Range Status Comment   Specimen Description URINE, CATHETERIZED   Final    Special Requests NONE   Final    Setup Time [id number]   Final    Colony Count NO GROWTH   Final    Culture NO GROWTH   Final    Report Status 07/27/2011 FINAL   Final   CSF CULTURE     Status: Normal (Preliminary result)   Collection Time   07/25/11 11:59 PM      Component Value Range Status Comment   Specimen Description CSF   Final    Special Requests NONE   Final    Gram Stain     Final    Value: CYTOSPIN WBC PRESENT,BOTH PMN AND MONONUCLEAR     NO ORGANISMS SEEN     Gram Stain Report Called to,Read Back By and Verified With: Gram Stain Report Called to,Read Back By and Verified With: DR CAMPBELL @1016  07/26/11 BY KRAWS   Culture Culture reincubated for better growth   Final    Report Status PENDING   Incomplete   PATHOLOGIST SMEAR REVIEW     Status: Normal   Collection Time   07/25/11 11:59 PM       Component Value Range Status Comment   Tech Review Reviewed by H. Hollice Espy, M.D.   Final     Studies/Results: No results found.  Medications: Scheduled Meds:    . ampicillin (OMNIPEN) IV  2 g Intravenous Q4H  . aspirin EC  81 mg Oral Daily  . enoxaparin  40 mg Subcutaneous Q24H  . feeding supplement  237 mL Oral BID BM  . olmesartan  40 mg Oral Daily  . pantoprazole  40 mg Oral Q breakfast  . potassium chloride  20 mEq Oral BID  . potassium chloride  40 mEq Oral Once  . DISCONTD: cefTRIAXone (ROCEPHIN)  IV  2 g Intravenous Q12H  . DISCONTD: dexamethasone  10 mg Intravenous Q6H  . DISCONTD: vancomycin  1,000 mg Intravenous Q24H   Continuous Infusions:    . 0.9 % NaCl with KCl 20 mEq / L 75 mL/hr at 07/28/11 1323   PRN Meds:.acetaminophen, acetaminophen, alum & mag hydroxide-simeth, HYDROcodone-acetaminophen, LORazepam, morphine, ondansetron (ZOFRAN) IV, ondansetron  Assessment/Plan:   Bacterial Meningitis Cont vancomycin, amicillin, rocephin, decadron. CSF growing Listeria.  NSTEMI Troponin elevated,  Cardiology only recommends medical management No intervention at this time. Cont aspirin 81 mg daily.   Encephalopathy Infectious, sec to meningitis Cont Iv antibiotics.   SIRS (systemic inflammatory response syndrome) Sec to meningitis Continue IV Abx. Prolactin is 4.91   COPD (chronic obstructive pulmonary disease) Stable   Hypokalemia Resolved.  Code status DNR   LOS: 3 days   Latiesha Harada S Pager: [phone] 07/28/2011, 5:54 PM

## 2011-07-28 NOTE — Progress Notes (Signed)
UR CHART REVIEWED; B Anginette Espejo RN, BSN, MHA 

## 2011-07-28 NOTE — Progress Notes (Signed)
Patient ID: RAJU COPPOLINO, male   DOB: Jun 10, 1927, 77 y.o.   MRN: [id number] INFECTIOUS DISEASE PROGRESS NOTE   Date of Admission:  07/25/2011   Day 3 total antibiotics     . ampicillin (OMNIPEN) IV  2 g Intravenous Q4H  . aspirin EC  81 mg Oral Daily  . cefTRIAXone (ROCEPHIN)  IV  2 g Intravenous Q12H  . dexamethasone  10 mg Intravenous Q6H  . enoxaparin  40 mg Subcutaneous Q24H  . feeding supplement  237 mL Oral BID BM  . olmesartan  40 mg Oral Daily  . pantoprazole  40 mg Oral Q breakfast  . potassium chloride  20 mEq Oral BID  . potassium chloride  40 mEq Oral Once  . vancomycin  1,000 mg Intravenous Q24H    Subjective: Mr. Melott is feeling much better today.   Objective: Temp (24hrs), Avg:97.5 F (36.4 C), Min:97.2 F (36.2 C), Max:97.7 F (36.5 C)    General: He is alert and comfortable sitting up eating dinner with the help of the nurse's aide. Skin: His bruises and abrasions are starting to heal. Lungs: Clear Cor: Regular S1 and S2 no murmur Abdomen: Soft and nontender His speech is normal. He believes he is in a rehabilitation center. He thinks the year is 60. He tells me he lives at home and [place] with his wife, Kathie Rhodes.  Lab Results Lab Results  Component Value Date   WBC 10.6* 07/28/2011   HGB 12.4* 07/28/2011   HCT 36.4* 07/28/2011   MCV 93.3 07/28/2011   PLT 172 07/28/2011    Lab Results  Component Value Date   CREATININE 1.34 07/28/2011   BUN 34* 07/28/2011   NA 146* 07/28/2011   K 4.1 07/28/2011   CL 117* 07/28/2011   CO2 20 07/28/2011    Lab Results  Component Value Date   ALT 22 07/25/2011   AST 80* 07/25/2011   ALKPHOS 54 07/25/2011   BILITOT 0.4 07/25/2011     Microbiology: Recent Results (from the past 240 hour(s))  CULTURE, BLOOD (ROUTINE X 2)     Status: Normal (Preliminary result)   Collection Time   07/25/11  6:27 PM      Component Value Range Status Comment   Specimen Description BLOOD LEFT ARM   Final    Special  Requests BOTTLES DRAWN AEROBIC AND ANAEROBIC  UNKNOWN   Final    Setup Time [id number]   Final    Culture     Final    Value:        BLOOD CULTURE RECEIVED NO GROWTH TO DATE CULTURE WILL BE HELD FOR 5 DAYS BEFORE ISSUING A FINAL NEGATIVE REPORT   Report Status PENDING   Incomplete   CULTURE, BLOOD (ROUTINE X 2)     Status: Normal (Preliminary result)   Collection Time   07/25/11  6:30 PM      Component Value Range Status Comment   Specimen Description BLOOD RIGHT HAND   Final    Special Requests BOTTLES DRAWN AEROBIC ONLY    Final    Setup Time [id number]   Final    Culture     Final    Value:        BLOOD CULTURE RECEIVED NO GROWTH TO DATE CULTURE WILL BE HELD FOR 5 DAYS BEFORE ISSUING A FINAL NEGATIVE REPORT   Report Status PENDING   Incomplete   URINE CULTURE     Status: Normal   Collection Time   07/25/11  6:31 PM      Component Value Range Status Comment   Specimen Description URINE, CATHETERIZED   Final    Special Requests NONE   Final    Setup Time [id number]   Final    Colony Count NO GROWTH   Final    Culture NO GROWTH   Final    Report Status 07/27/2011 FINAL   Final   CSF CULTURE     Status: Normal (Preliminary result)   Collection Time   07/25/11 11:59 PM      Component Value Range Status Comment   Specimen Description CSF   Final    Special Requests NONE   Final    Gram Stain     Final    Value: CYTOSPIN WBC PRESENT,BOTH PMN AND MONONUCLEAR     NO ORGANISMS SEEN     Gram Stain Report Called to,Read Back By and Verified With: Gram Stain Report Called to,Read Back By and Verified With: DR Eann Cleland @1016  07/26/11 BY KRAWS   Culture Culture reincubated for better growth   Final    Report Status PENDING   Incomplete   PATHOLOGIST SMEAR REVIEW     Status: Normal   Collection Time   07/25/11 11:59 PM      Component Value Range Status Comment   Tech Review Reviewed by H. Hollice Espy, M.D.   Final     Studies/Results: No results found.   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 Fortunately he has shown significant improvement in the last 48 hours. I will continue ampicillin as a single agent. If he shows any sign of worsening I will consider adding gentamicin. He does not need any isolation precautions.  Plan: 1. Continue ampicillin. 2. Discontinue vancomycin and ceftriaxone and dexamethasone.   [name], MD [facility] [phone] pager   [phone] cell 07/28/2011, 5:05 PM

## 2011-07-28 NOTE — Progress Notes (Signed)
HAVE NOTIFIED INFECTION PREVENTION ABOUT PT. RARE GRAM POSITIVE RODS POSSIBLY LISTERIA, JEFF BEAMAN WITH INFECTION PREVENTION STATES SINCE PT. HAS BEEN ON ANTIBIOTICS FOR 24 HOURS HE DOES NOT NEED TO BE ON DROPLET AT THIS TIME. WILL CONTINUE TO MONITOR AND OBSERVE PT.

## 2011-07-29 LAB — CBC
MCH: 31.3 pg (ref 26.0–34.0)
MCHC: 33.1 g/dL (ref 30.0–36.0)
Platelets: 163 10*3/uL (ref 150–400)

## 2011-07-29 LAB — CLOSTRIDIUM DIFFICILE BY PCR: Toxigenic C. Difficile by PCR: NEGATIVE

## 2011-07-29 MED ORDER — SODIUM CHLORIDE 0.9 % IJ SOLN
10.0000 mL | INTRAMUSCULAR | Status: DC | PRN
  Administered 2011-07-30 – 2011-08-06 (×4): 10 mL

## 2011-07-29 NOTE — Progress Notes (Signed)
Patient ID: Alec Walker, male   DOB: [birth year], 77 y.o.   MRN: [id number] INFECTIOUS DISEASE PROGRESS NOTE   Date of Admission:  07/25/2011   Day 5 total antibiotics     . ampicillin (OMNIPEN) IV  2 g Intravenous Q4H  . aspirin EC  81 mg Oral Daily  . enoxaparin  40 mg Subcutaneous Q24H  . feeding supplement  237 mL Oral BID BM  . olmesartan  40 mg Oral Daily  . pantoprazole  40 mg Oral Q breakfast  . potassium chloride  20 mEq Oral BID  . potassium chloride  40 mEq Oral Once  . DISCONTD: cefTRIAXone (ROCEPHIN)  IV  2 g Intravenous Q12H  . DISCONTD: dexamethasone  10 mg Intravenous Q6H  . DISCONTD: vancomycin  1,000 mg Intravenous Q24H    Subjective: He says his appetite is poor. No HA.  Objective: Temp (24hrs), Avg:97.9 F (36.6 C), Min:97.2 F (36.2 C), Max:98.9 F (37.2 C)    General: alert, talkative, sitting uo in thwe chair Lungs: clear Cor: reg S1 and S2 Less confused but still disoriented (it's 2002)  Lab Results Lab Results  Component Value Date   WBC 8.8 07/29/2011   HGB 11.7* 07/29/2011   HCT 35.3* 07/29/2011   MCV 94.4 07/29/2011   PLT 163 07/29/2011    Lab Results  Component Value Date   CREATININE 1.34 07/28/2011   BUN 34* 07/28/2011   NA 146* 07/28/2011   K 4.1 07/28/2011   CL 117* 07/28/2011   CO2 20 07/28/2011    Lab Results  Component Value Date   ALT 22 07/25/2011   AST 80* 07/25/2011   ALKPHOS 54 07/25/2011   BILITOT 0.4 07/25/2011     Microbiology: Recent Results (from the past 240 hour(s))  CULTURE, BLOOD (ROUTINE X 2)     Status: Normal (Preliminary result)   Collection Time   07/25/11  6:27 PM      Component Value Range Status Comment   Specimen Description BLOOD LEFT ARM   Final    Special Requests BOTTLES DRAWN AEROBIC AND ANAEROBIC  UNKNOWN   Final    Setup Time [id number]   Final    Culture     Final    Value:        BLOOD CULTURE RECEIVED NO GROWTH TO DATE CULTURE WILL BE HELD FOR 5 DAYS BEFORE ISSUING A FINAL  NEGATIVE REPORT   Report Status PENDING   Incomplete   CULTURE, BLOOD (ROUTINE X 2)     Status: Normal (Preliminary result)   Collection Time   07/25/11  6:30 PM      Component Value Range Status Comment   Specimen Description BLOOD RIGHT HAND   Final    Special Requests BOTTLES DRAWN AEROBIC ONLY    Final    Setup Time [id number]   Final    Culture     Final    Value:        BLOOD CULTURE RECEIVED NO GROWTH TO DATE CULTURE WILL BE HELD FOR 5 DAYS BEFORE ISSUING A FINAL NEGATIVE REPORT   Report Status PENDING   Incomplete   URINE CULTURE     Status: Normal   Collection Time   07/25/11  6:31 PM      Component Value Range Status Comment   Specimen Description URINE, CATHETERIZED   Final    Special Requests NONE   Final    Setup Time [id number]   Final    Colony Count  NO GROWTH   Final    Culture NO GROWTH   Final    Report Status 07/27/2011 FINAL   Final   CSF CULTURE     Status: Normal (Preliminary result)   Collection Time   07/25/11 11:59 PM      Component Value Range Status Comment   Specimen Description CSF   Final    Special Requests NONE   Final    Gram Stain     Final    Value: CYTOSPIN WBC PRESENT,BOTH PMN AND MONONUCLEAR     NO ORGANISMS SEEN     Gram Stain Report Called to,Read Back By and Verified With: Gram Stain Report Called to,Read Back By and Verified With: DR Cesilia Shinn @1016  07/26/11 BY KRAWS   Culture     Final    Value: RARE LISTERIA MONOCYTOGENES     Note: CRITICAL RESULT CALLED TO, READ BACK BY AND VERIFIED WITH: HILLARY HOLDERNESS 07/28/11 1450 BY SMITHERSJ CRITICAL RESULT CALLED TO, READ BACK BY AND VERIFIED WITH: DR Laquetta Racey 07/28/11 1500 BY SMITHERSJ Standardized susceptibility testing for this      organism is not available.   Report Status PENDING   Incomplete   PATHOLOGIST SMEAR REVIEW     Status: Normal   Collection Time   07/25/11 11:59 PM      Component Value Range Status Comment   Tech Review Reviewed by H. Hollice Espy, M.D.   Final     CLOSTRIDIUM DIFFICILE BY PCR     Status: Normal   Collection Time   07/28/11  7:47 PM      Component Value Range Status Comment   C difficile by pcr NEGATIVE  NEGATIVE  Final     Studies/Results: No results found.   Assessment: Improving Listeria meningitis  Plan: Continue IV ampicillin up to 4 weeks Place PICC    [name], MD [facility] for Infectious Diseases [phone] pager   [phone] cell 07/29/2011, 3:45 PM

## 2011-07-29 NOTE — Progress Notes (Signed)
Subjective: Patient seen , admitted with fever, syncope. CSF culture growing Listeria Monocytogenes. Patient is on Ampicillin. Objective: Vital signs in last 24 hours: Temp:  [97.2 F (36.2 C)-98.9 F (37.2 C)] 98.9 F (37.2 C) (12/11 1320) Pulse Rate:  [73-92] 92  (12/11 1320) Resp:  [20-26] 22  (12/11 1320) BP: (150-159)/(76-87) 158/76 mmHg (12/11 1320) SpO2:  [95 %-100 %] 95 % (12/11 1320) Weight change:  Last BM Date: 07/28/11  Intake/Output from previous day: 12/10 0701 - 12/11 0700 In: 2163.8 [P.O.:940; I.V.:1023.8; IV Piggyback:200] Out: 850 [Urine:850] Total I/O In: 1222.5 [P.O.:360; I.V.:712.5; IV Piggyback:150] Out: 1100 [Urine:1100]   Physical Exam: General: Alert, not oriented x 2 Heart: Regular rate and rhythm, without murmurs, rubs, gallops. Lungs: Clear to auscultation bilaterally. Abdomen: Soft, nontender, nondistended, positive bowel sounds. Extremities: No clubbing cyanosis or edema with positive pedal pulses. Neuro: Confused.    Lab Results: Basic Metabolic Panel:  Basename 07/28/11 0458 07/27/11 0835 07/26/11 1940  NA 146* 141 --  K 4.1 3.3* --  CL 117* 109 --  CO2 20 17* --  GLUCOSE 163* 135* --  BUN 34* 30* --  CREATININE 1.34 1.28 --  CALCIUM 9.3 9.3 --  MG -- 2.0 1.9  PHOS -- -- --   CBC:  Basename 07/29/11 0440 07/28/11 0458  WBC 8.8 10.6*  NEUTROABS -- --  HGB 11.7* 12.4*  HCT 35.3* 36.4*  MCV 94.4 93.3  PLT 163 172   Cardiac Enzymes:  Basename 07/27/11 1625 07/27/11 0835 07/26/11 1730  CKTOTAL -- -- 7231*  CKMB -- -- 13.5*  CKMBINDEX -- -- --  TROPONINI 2.28* 2.37* 3.65*    Recent Results (from the past 240 hour(s))  CULTURE, BLOOD (ROUTINE X 2)     Status: Normal (Preliminary result)   Collection Time   07/25/11  6:27 PM      Component Value Range Status Comment   Specimen Description BLOOD LEFT ARM   Final    Special Requests BOTTLES DRAWN AEROBIC AND ANAEROBIC  UNKNOWN   Final    Setup Time [id number]   Final      Culture     Final    Value:        BLOOD CULTURE RECEIVED NO GROWTH TO DATE CULTURE WILL BE HELD FOR 5 DAYS BEFORE ISSUING A FINAL NEGATIVE REPORT   Report Status PENDING   Incomplete   CULTURE, BLOOD (ROUTINE X 2)     Status: Normal (Preliminary result)   Collection Time   07/25/11  6:30 PM      Component Value Range Status Comment   Specimen Description BLOOD RIGHT HAND   Final    Special Requests BOTTLES DRAWN AEROBIC ONLY    Final    Setup Time [id number]   Final    Culture     Final    Value:        BLOOD CULTURE RECEIVED NO GROWTH TO DATE CULTURE WILL BE HELD FOR 5 DAYS BEFORE ISSUING A FINAL NEGATIVE REPORT   Report Status PENDING   Incomplete   URINE CULTURE     Status: Normal   Collection Time   07/25/11  6:31 PM      Component Value Range Status Comment   Specimen Description URINE, CATHETERIZED   Final    Special Requests NONE   Final    Setup Time [id number]   Final    Colony Count NO GROWTH   Final    Culture NO GROWTH   Final  Report Status 07/27/2011 FINAL   Final   CSF CULTURE     Status: Normal (Preliminary result)   Collection Time   07/25/11 11:59 PM      Component Value Range Status Comment   Specimen Description CSF   Final    Special Requests NONE   Final    Gram Stain     Final    Value: CYTOSPIN WBC PRESENT,BOTH PMN AND MONONUCLEAR     NO ORGANISMS SEEN     Gram Stain Report Called to,Read Back By and Verified With: Gram Stain Report Called to,Read Back By and Verified With: DR CAMPBELL @1016  07/26/11 BY KRAWS   Culture     Final    Value: RARE LISTERIA MONOCYTOGENES     Note: CRITICAL RESULT CALLED TO, READ BACK BY AND VERIFIED WITH: HILLARY HOLDERNESS 07/28/11 1450 BY SMITHERSJ CRITICAL RESULT CALLED TO, READ BACK BY AND VERIFIED WITH: DR CAMPBELL 07/28/11 1500 BY SMITHERSJ Standardized susceptibility testing for this      organism is not available.   Report Status PENDING   Incomplete   PATHOLOGIST SMEAR REVIEW     Status: Normal   Collection  Time   07/25/11 11:59 PM      Component Value Range Status Comment   Tech Review Reviewed by H. Hollice Espy, M.D.   Final   CLOSTRIDIUM DIFFICILE BY PCR     Status: Normal   Collection Time   07/28/11  7:47 PM      Component Value Range Status Comment   C difficile by pcr NEGATIVE  NEGATIVE  Final     Studies/Results: No results found.  Medications: Scheduled Meds:    . ampicillin (OMNIPEN) IV  2 g Intravenous Q4H  . aspirin EC  81 mg Oral Daily  . enoxaparin  40 mg Subcutaneous Q24H  . feeding supplement  237 mL Oral BID BM  . olmesartan  40 mg Oral Daily  . pantoprazole  40 mg Oral Q breakfast  . potassium chloride  20 mEq Oral BID  . potassium chloride  40 mEq Oral Once  . DISCONTD: cefTRIAXone (ROCEPHIN)  IV  2 g Intravenous Q12H  . DISCONTD: dexamethasone  10 mg Intravenous Q6H  . DISCONTD: vancomycin  1,000 mg Intravenous Q24H   Continuous Infusions:    . 0.9 % NaCl with KCl 20 mEq / L 75 mL/hr at 07/29/11 0301   PRN Meds:.acetaminophen, acetaminophen, alum & mag hydroxide-simeth, HYDROcodone-acetaminophen, LORazepam, morphine, ondansetron (ZOFRAN) IV, ondansetron  Assessment/Plan:   Bacterial Meningitis Cont vancomycin, amicillin, rocephin, decadron. CSF growing Listeria. ID recommend to five ampicillin for four weeks. NSTEMI Troponin elevated,  Cardiology only recommends medical management No intervention at this time. Cont aspirin 81 mg daily. Encephalopathy Infectious, sec to meningitis Cont Iv antibiotics. SIRS (systemic inflammatory response syndrome) Sec to meningitis Resolved. Continue IV Abx. COPD (chronic obstructive pulmonary disease) Stable Hypokalemia Resolved. Code status DNR DVT Prophylaxis Lovenox.  Dispo SNF vs home with home health. Patient is still mildly  confused, so will need to discuss with him when mentally clear.   LOS: 4 days   [facility], [place] Pager: [phone] 07/29/2011, 4:34 PM

## 2011-07-30 NOTE — Progress Notes (Signed)
Patient ID: Alec Walker, male   DOB: 07-18-1927, 77 y.o.   MRN: [id number]  Subjective: No events overnight. Patient denies chest pain, shortness of breath, abdominal pain. Had bowel movement and reports ambulating.  Objective:  Vital signs in last 24 hours:  Filed Vitals:   07/29/11 2214 07/30/11 0626 07/30/11 1413 07/30/11 2205  BP: 149/79 166/78 159/74 150/71  Pulse: 79 78 83 75  Temp: 98.9 F (37.2 C) 98.2 F (36.8 C) 99.3 F (37.4 C) 98.6 F (37 C)  TempSrc: Oral Oral Oral Oral  Resp: 20 24 22 24   Height:      Weight:      SpO2: 96% 98% 96% 97%    Intake/Output from previous day:   Intake/Output Summary (Last 24 hours) at 07/30/11 2243 Last data filed at 07/30/11 2207  Gross per 24 hour  Intake 2566.25 ml  Output   2407 ml  Net 159.25 ml    Physical Exam: General: Alert, awake, oriented to name only, in no acute distress. Neuro: Still confused but per nurse improving since yesterday  Lab Results:  Basic Metabolic Panel:    Component Value Date/Time   NA 146* 07/28/2011 0458   K 4.1 07/28/2011 0458   CL 117* 07/28/2011 0458   CO2 20 07/28/2011 0458   BUN 34* 07/28/2011 0458   CREATININE 1.34 07/28/2011 0458   GLUCOSE 163* 07/28/2011 0458   CALCIUM 9.3 07/28/2011 0458   CBC:    Component Value Date/Time   WBC 8.8 07/29/2011 0440   HGB 11.7* 07/29/2011 0440   HCT 35.3* 07/29/2011 0440   PLT 163 07/29/2011 0440   MCV 94.4 07/29/2011 0440      Lab 07/29/11 0440 07/28/11 0458 07/27/11 0835 07/25/11 1833  WBC 8.8 10.6* 11.9* 16.9*  HGB 11.7* 12.4* 12.3* 11.5*  HCT 35.3* 36.4* 36.4* 34.7*  PLT 163 172 176 143*  MCV 94.4 93.3 94.3 93.8  MCH 31.3 31.8 31.9 31.1  MCHC 33.1 34.1 33.8 33.1  RDW 14.2 14.0 14.0 13.4  LYMPHSABS -- -- -- --  MONOABS -- -- -- --  EOSABS -- -- -- --  BASOSABS -- -- -- --  BANDABS -- -- -- --    Lab 07/28/11 0458 07/27/11 0835 07/26/11 1940 07/26/11 0945 07/25/11 1833  NA 146* 141 -- 134* 132*  K 4.1 3.3* --  2.8* 2.5*  CL 117* 109 -- 101 97  CO2 20 17* -- 18* 21  GLUCOSE 163* 135* -- 108* 122*  BUN 34* 30* -- 21 22  CREATININE 1.34 1.28 -- 1.20 1.23  CALCIUM 9.3 9.3 -- 8.8 9.2  MG -- 2.0 1.9 -- --   No results found for this basename: INR:5,PROTIME:5 in the last 168 hours Cardiac markers:  Lab 07/27/11 1625 07/27/11 0835 07/26/11 1730  CKMB -- -- 13.5*  TROPONINI 2.28* 2.37* 3.65*  MYOGLOBIN -- -- --   No components found with this basename: POCBNP:3 Recent Results (from the past 240 hour(s))  CULTURE, BLOOD (ROUTINE X 2)     Status: Normal (Preliminary result)   Collection Time   07/25/11  6:27 PM      Component Value Range Status Comment   Specimen Description BLOOD LEFT ARM   Final    Special Requests BOTTLES DRAWN AEROBIC AND ANAEROBIC  UNKNOWN   Final    Setup Time [id number]   Final    Culture     Final    Value:        BLOOD CULTURE  RECEIVED NO GROWTH TO DATE CULTURE WILL BE HELD FOR 5 DAYS BEFORE ISSUING A FINAL NEGATIVE REPORT   Report Status PENDING   Incomplete   CULTURE, BLOOD (ROUTINE X 2)     Status: Normal (Preliminary result)   Collection Time   07/25/11  6:30 PM      Component Value Range Status Comment   Specimen Description BLOOD RIGHT HAND   Final    Special Requests BOTTLES DRAWN AEROBIC ONLY    Final    Setup Time [id number]   Final    Culture     Final    Value:        BLOOD CULTURE RECEIVED NO GROWTH TO DATE CULTURE WILL BE HELD FOR 5 DAYS BEFORE ISSUING A FINAL NEGATIVE REPORT   Report Status PENDING   Incomplete   URINE CULTURE     Status: Normal   Collection Time   07/25/11  6:31 PM      Component Value Range Status Comment   Specimen Description URINE, CATHETERIZED   Final    Special Requests NONE   Final    Setup Time [id number]   Final    Colony Count NO GROWTH   Final    Culture NO GROWTH   Final    Report Status 07/27/2011 FINAL   Final   CSF CULTURE     Status: Normal (Preliminary result)   Collection Time   07/25/11 11:59 PM       Component Value Range Status Comment   Specimen Description CSF   Final    Special Requests NONE   Final    Gram Stain     Final    Value: CYTOSPIN WBC PRESENT,BOTH PMN AND MONONUCLEAR     NO ORGANISMS SEEN     Gram Stain Report Called to,Read Back By and Verified With: Gram Stain Report Called to,Read Back By and Verified With: DR CAMPBELL @1016  07/26/11 BY KRAWS   Culture     Final    Value: RARE LISTERIA MONOCYTOGENES     Note: CRITICAL RESULT CALLED TO, READ BACK BY AND VERIFIED WITH: HILLARY HOLDERNESS 07/28/11 1450 BY SMITHERSJ CRITICAL RESULT CALLED TO, READ BACK BY AND VERIFIED WITH: DR CAMPBELL 07/28/11 1500 BY SMITHERSJ Standardized susceptibility testing for this      organism is not available.   Report Status PENDING   Incomplete   PATHOLOGIST SMEAR REVIEW     Status: Normal   Collection Time   07/25/11 11:59 PM      Component Value Range Status Comment   Tech Review Reviewed by H. Hollice Espy, M.D.   Final   CLOSTRIDIUM DIFFICILE BY PCR     Status: Normal   Collection Time   07/28/11  7:47 PM      Component Value Range Status Comment   C difficile by pcr NEGATIVE  NEGATIVE  Final     Studies/Results: No results found.  Medications: Scheduled Meds:   . ampicillin (OMNIPEN) IV  2 g Intravenous Q4H  . aspirin EC  81 mg Oral Daily  . enoxaparin  40 mg Subcutaneous Q24H  . feeding supplement  237 mL Oral BID BM  . olmesartan  40 mg Oral Daily  . pantoprazole  40 mg Oral Q breakfast  . potassium chloride  40 mEq Oral Once   Continuous Infusions:   . 0.9 % NaCl with KCl 20 mEq / L 75 mL/hr at 07/30/11 2116   PRN Meds:.acetaminophen, acetaminophen, alum & mag hydroxide-simeth,  HYDROcodone-acetaminophen, LORazepam, morphine, ondansetron (ZOFRAN) IV, ondansetron, sodium chloride  Assessment/Plan:  Bacterial Meningitis  Cont ampicillin CSF growing Listeria.  ID recommend to five ampicillin for four weeks.   NSTEMI  Troponin elevated,  Cardiology only recommends  medical management  No intervention at this time.  Cont aspirin 81 mg daily.   Encephalopathy  Infectious, sec to meningitis  Cont Iv antibiotics.   SIRS (systemic inflammatory response syndrome)  Sec to meningitis  Resolved.  Continue IV Abx.   COPD (chronic obstructive pulmonary disease)  Stable   Hypokalemia  Resolved.   Code status  DNR   DVT Prophylaxis  Lovenox.   Dispo  SNF vs home with home health.  Patient is still mildly confused, so will need to discuss with him when mentally clear.       LOS: 5 days   MAGICK-Fortune Brannigan 07/30/2011, 10:43 PM

## 2011-07-30 NOTE — Progress Notes (Signed)
[id number]/WNUUVO Davis,RN,BSN,CCM  Spoke with patient wants to use [facility] for home iv abx and picc line care.  tct-Susan Amada Jupiter alerted of patient and need.

## 2011-07-30 NOTE — Progress Notes (Signed)
CARE MANAGEMENT NOTE 07/30/2011  Patient:  Alec Walker, Alec Walker   Account Number:  [id number]  Date Initiated:  07/28/2011  Documentation initiated by:  Jiles Crocker  Subjective/Objective Assessment:   ADMITTED WITH ALTERED LOC     Action/Plan:   LIVES WITH SPOUSE; PCP IS DR Ponshewaing Sink   Anticipated DC Date:  08/04/2011   Anticipated DC Plan:  HOME W HOME HEALTH SERVICES      DC Planning Services  CM consult      [facility] Choice  HOME HEALTH   Choice offered to / List presented to:  C-1 Patient        HH arranged  HH-1 RN      [facility] agency  [facility]   Status of service:  In process, will continue to follow Medicare Important Message given?   (If response is "NO", the following Medicare IM given date fields will be blank) Date Medicare IM given:   Date Additional Medicare IM given:    Discharge Disposition:    Per UR Regulation:  Reviewed for med. necessity/level of care/duration of stay  Comments:  [id number]/Bailea Beed,RN,BSN,CCM-Spoke with patient concerning possible need for hhc and iv abx, would like to use [facility] needed/representive made aware of patient and possible need.  12/10/2012Sofie Rower RN, BSN, MHA

## 2011-07-30 NOTE — Progress Notes (Signed)
Patient ID: Alec Walker, male   DOB: 1926-09-15, 77 y.o.   MRN: [id number] INFECTIOUS DISEASE PROGRESS NOTE   Date of Admission:  07/25/2011   Day 6 total antibiotics     . ampicillin (OMNIPEN) IV  2 g Intravenous Q4H  . aspirin EC  81 mg Oral Daily  . enoxaparin  40 mg Subcutaneous Q24H  . feeding supplement  237 mL Oral BID BM  . olmesartan  40 mg Oral Daily  . pantoprazole  40 mg Oral Q breakfast  . potassium chloride  40 mEq Oral Once    Subjective: "I'm just here."  Objective: Temp (24hrs), Avg:98.8 F (37.1 C), Min:98.2 F (36.8 C), Max:99.3 F (37.4 C)    General: alert, comfortable, watching TV Skin: abrasions healing He is oriented to month and day, but not year or place.  Lab Results Lab Results  Component Value Date   WBC 8.8 07/29/2011   HGB 11.7* 07/29/2011   HCT 35.3* 07/29/2011   MCV 94.4 07/29/2011   PLT 163 07/29/2011    Lab Results  Component Value Date   CREATININE 1.34 07/28/2011   BUN 34* 07/28/2011   NA 146* 07/28/2011   K 4.1 07/28/2011   CL 117* 07/28/2011   CO2 20 07/28/2011    Lab Results  Component Value Date   ALT 22 07/25/2011   AST 80* 07/25/2011   ALKPHOS 54 07/25/2011   BILITOT 0.4 07/25/2011     Microbiology: Recent Results (from the past 240 hour(s))  CULTURE, BLOOD (ROUTINE X 2)     Status: Normal (Preliminary result)   Collection Time   07/25/11  6:27 PM      Component Value Range Status Comment   Specimen Description BLOOD LEFT ARM   Final    Special Requests BOTTLES DRAWN AEROBIC AND ANAEROBIC  UNKNOWN   Final    Setup Time [id number]   Final    Culture     Final    Value:        BLOOD CULTURE RECEIVED NO GROWTH TO DATE CULTURE WILL BE HELD FOR 5 DAYS BEFORE ISSUING A FINAL NEGATIVE REPORT   Report Status PENDING   Incomplete   CULTURE, BLOOD (ROUTINE X 2)     Status: Normal (Preliminary result)   Collection Time   07/25/11  6:30 PM      Component Value Range Status Comment   Specimen Description BLOOD RIGHT  HAND   Final    Special Requests BOTTLES DRAWN AEROBIC ONLY    Final    Setup Time [id number]   Final    Culture     Final    Value:        BLOOD CULTURE RECEIVED NO GROWTH TO DATE CULTURE WILL BE HELD FOR 5 DAYS BEFORE ISSUING A FINAL NEGATIVE REPORT   Report Status PENDING   Incomplete   URINE CULTURE     Status: Normal   Collection Time   07/25/11  6:31 PM      Component Value Range Status Comment   Specimen Description URINE, CATHETERIZED   Final    Special Requests NONE   Final    Setup Time [id number]   Final    Colony Count NO GROWTH   Final    Culture NO GROWTH   Final    Report Status 07/27/2011 FINAL   Final   CSF CULTURE     Status: Normal (Preliminary result)   Collection Time   07/25/11 11:59 PM  Component Value Range Status Comment   Specimen Description CSF   Final    Special Requests NONE   Final    Gram Stain     Final    Value: CYTOSPIN WBC PRESENT,BOTH PMN AND MONONUCLEAR     NO ORGANISMS SEEN     Gram Stain Report Called to,Read Back By and Verified With: Gram Stain Report Called to,Read Back By and Verified With: DR Garrit Marrow @1016  07/26/11 BY KRAWS   Culture     Final    Value: RARE LISTERIA MONOCYTOGENES     Note: CRITICAL RESULT CALLED TO, READ BACK BY AND VERIFIED WITH: HILLARY HOLDERNESS 07/28/11 1450 BY SMITHERSJ CRITICAL RESULT CALLED TO, READ BACK BY AND VERIFIED WITH: DR Lavette Yankovich 07/28/11 1500 BY SMITHERSJ Standardized susceptibility testing for this      organism is not available.   Report Status PENDING   Incomplete   PATHOLOGIST SMEAR REVIEW     Status: Normal   Collection Time   07/25/11 11:59 PM      Component Value Range Status Comment   Tech Review Reviewed by H. Hollice Espy, M.D.   Final   CLOSTRIDIUM DIFFICILE BY PCR     Status: Normal   Collection Time   07/28/11  7:47 PM      Component Value Range Status Comment   C difficile by pcr NEGATIVE  NEGATIVE  Final     Studies/Results: No results found.   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  Plan: 1. Continue ampicillin   [name], MD [facility] for Infectious Diseases [phone] pager   [phone] cell 07/30/2011, 4:22 PM

## 2011-07-30 NOTE — Progress Notes (Signed)
Pt just had 10 beat run of V-tach.  Pt is asymptomatic.  Pt is currently NSR with a hear rate of 72.  MD notified.  Awaiting orders.  Will continue to monitor.

## 2011-07-31 LAB — CULTURE, BLOOD (ROUTINE X 2)
Culture  Setup Time: 201212072315
Culture: NO GROWTH

## 2011-07-31 LAB — BASIC METABOLIC PANEL
BUN: 20 mg/dL (ref 6–23)
CO2: 23 mEq/L (ref 19–32)
Calcium: 7.7 mg/dL — ABNORMAL LOW (ref 8.4–10.5)
Creatinine, Ser: 1.1 mg/dL (ref 0.50–1.35)
GFR calc non Af Amer: 61 mL/min — ABNORMAL LOW (ref >90)
Glucose, Bld: 98 mg/dL (ref 70–99)

## 2011-07-31 LAB — CBC
HCT: 30.8 % — ABNORMAL LOW (ref 39.0–52.0)
Hemoglobin: 10.3 g/dL — ABNORMAL LOW (ref 13.0–17.0)
MCH: 31.5 pg (ref 26.0–34.0)
MCHC: 33.4 g/dL (ref 30.0–36.0)
MCV: 94.2 fL (ref 78.0–100.0)

## 2011-07-31 NOTE — Progress Notes (Signed)
Physical Therapy Evaluation Patient Details Name: Alec Walker MRN: [id number] DOB: May 18, 1927 Today's Date: 07/31/2011 Time: 9147-8295  Eval II Problem List:  Patient Active Problem List  Diagnoses  . SIRS (systemic inflammatory response syndrome)  . COPD (chronic obstructive pulmonary disease)  . CAD (coronary artery disease)  . Elevated troponin  . Syncope and collapse  . Meningitis    Past Medical History:  Past Medical History  Diagnosis Date  . COPD (chronic obstructive pulmonary disease)   . CAD (coronary artery disease)   . HTN (hypertension)   . PUD (peptic ulcer disease)   . Syncope 07/25/11    x 2 today   Past Surgical History:  Past Surgical History  Procedure Date  . Cataract extraction   . Gastrectomy   . Transurethral resection of prostate   . Coronary angioplasty with stent placement     stent in 2003    PT Assessment/Plan/Recommendation PT Assessment Clinical Impression Statement: Pt presents with diagnosis of syncope and collapse. Pt will benefit from skilled PT in the acute care setting to improve general strength, activity tolerance, and overall functional mobility in preperation for D/C.  PT Recommendation/Assessment: Patient will need skilled PT in the acute care venue PT Problem List: Decreased strength;Decreased activity tolerance;Decreased balance;Decreased mobility;Decreased knowledge of use of DME;Pain Barriers to Discharge: Decreased caregiver support PT Therapy Diagnosis : Difficulty walking;Generalized weakness PT Plan PT Frequency: Min 3X/week PT Treatment/Interventions: DME instruction;Gait training;Functional mobility training;Therapeutic activities;Therapeutic exercise;Patient/family education PT Recommendation Recommendations for Other Services: OT consult Follow Up Recommendations: Skilled nursing facility (unless pt able to progress well with mobility/safety) Equipment Recommended:  (to be determined) PT Goals  Acute Rehab PT  Goals PT Goal Formulation: With patient Pt will go Supine/Side to Sit: with supervision Pt will go Sit to Supine/Side: with supervision Pt will go Sit to Stand: with supervision Pt will go Stand to Sit: with supervision Pt will Ambulate: 16 - 50 feet;with supervision;with least restrictive assistive device Pt will Go Up / Down Stairs: 6-9 stairs;with rail(s);with supervision (7 steps)  PT Evaluation Precautions/Restrictions    Prior Functioning  Home Living Lives With: Spouse Type of Home: House Home Layout: One level Home Access: Stairs to enter Entrance Stairs-Rails: Right Entrance Stairs-Number of Steps: 7 Home Adaptive Equipment: None Prior Function Level of Independence: Independent with gait;Independent with basic ADLs Cognition Cognition Arousal/Alertness: Awake/alert Overall Cognitive Status: No family/caregiver present to determine baseline cognitive functioning Orientation Level: Oriented to person Sensation/Coordination Coordination Gross Motor Movements are Fluid and Coordinated: Yes Extremity Assessment RLE Assessment RLE Assessment: Exceptions to [facility] RLE Strength RLE Overall Strength Comments: Strength > or = 3+/5 LLE Assessment LLE Assessment: Exceptions to [facility] LLE Strength LLE Overall Strength Comments: strength > or = 3+/5 Mobility (including Balance) Bed Mobility Bed Mobility: Yes Left Sidelying to Sit: 4: Min assist;With rails;HOB elevated (comment degrees) Left Sidelying to Sit Details (indicate cue type and reason): VCs safety, technique, instruction. Increased time. Assist for trunk to upright.  Sit to Supine - Left: 4: Min assist;HOB elevated (comment degrees) Sit to Supine - Left Details (indicate cue type and reason): Assist for LEs onto bed Transfers Transfers: Yes Sit to Stand: 4: Min assist;From bed;From elevated surface;With upper extremity assist Sit to Stand Details (indicate cue type and reason): VCs safety, technique, hand placement.  Assist to rise, stabilize.  Stand to Sit: 4: Min assist;With upper extremity assist;With armrests;To chair/3-in-1 Stand to Sit Details: VCs safety, technqiue, hand placement. Assist to control descent.  Stand Pivot  Transfers: 4: Min Actuary Details (indicate cue type and reason): with RW. VCs safety, technique. Assist to stabilize.  Ambulation/Gait Ambulation/Gait: No  Posture/Postural Control Posture/Postural Control: No significant limitations Exercise    End of Session PT - End of Session Activity Tolerance: Patient limited by fatigue Patient left: in bed;with call bell in reach;with bed alarm set General Behavior During Session: [facility] for tasks performed Cognition: [facility] for tasks performed  Rebeca Alert [facility] 07/31/2011, 2:27 PM [phone]

## 2011-07-31 NOTE — Progress Notes (Signed)
Subjective Pt is alert and oriented x 3 today. He does not remember the events of the last few days. Objective: Filed Vitals:   07/30/11 1413 07/30/11 2205 07/31/11 0507 07/31/11 1408  BP: 159/74 150/71 158/77 157/86  Pulse: 83 75 74 79  Temp: 99.3 F (37.4 C) 98.6 F (37 C) 98.1 F (36.7 C) 98.8 F (37.1 C)  TempSrc: Oral Oral Oral Oral  Resp: 22 24 20 22   Height:      Weight:      SpO2: 96% 97% 96% 98%   Weight change:   Intake/Output Summary (Last 24 hours) at 07/31/11 1605 Last data filed at 07/31/11 1527  Gross per 24 hour  Intake 2771.25 ml  Output   2508 ml  Net 263.25 ml    General: Alert, awake, oriented x3, in no acute distress.  HEENT: [place]/AT PEERL, EOMI Neck: Trachea midline,  no masses, no thyromegal,y no JVD, no carotid bruit OROPHARYNX:  Moist, No exudate/ erythema/lesions.  Heart: Regular rate and rhythm, without murmurs, rubs, gallops, PMI non-displaced, no heaves or thrills on palpation.  Lungs:Transmitted upper airway sounds. Rhonchi noted in BLL.  No increased vocal fremitus resonant to percussion  Abdomen: Soft, nontender, nondistended, positive bowel sounds, no masses no hepatosplenomegaly noted..  Neuro: No focal neurological deficits noted cranial nerves II through XII grossly intact. DTRs 2+ bilaterally upper and lower extremities. Strength 5 out of 5 in bilateral upper and lower extremities. Musculoskeletal: No warm swelling or erythema around joints, no spinal tenderness noted. Psychiatric: Patient alert and oriented x3, good insight and cognition, good recent to remote recall. Lymph node survey: No cervical axillary or inguinal lymphadenopathy noted.     Lab Results:  [facility] 07/31/11 0550  NA 136  K 3.6  CL 106  CO2 23  GLUCOSE 98  BUN 20  CREATININE 1.10  CALCIUM 7.7*  MG --  PHOS --   No results found for this basename: AST:2,ALT:2,ALKPHOS:2,BILITOT:2,PROT:2,ALBUMIN:2 in the last 72 hours No results found for this basename:  LIPASE:2,AMYLASE:2 in the last 72 hours  Basename 07/31/11 0550 07/29/11 0440  WBC 5.7 8.8  NEUTROABS -- --  HGB 10.3* 11.7*  HCT 30.8* 35.3*  MCV 94.2 94.4  PLT 128* 163   No results found for this basename: CKTOTAL:3,CKMB:3,CKMBINDEX:3,TROPONINI:3 in the last 72 hours No components found with this basename: POCBNP:3 No results found for this basename: DDIMER:2 in the last 72 hours No results found for this basename: HGBA1C:2 in the last 72 hours No results found for this basename: CHOL:2,HDL:2,LDLCALC:2,TRIG:2,CHOLHDL:2,LDLDIRECT:2 in the last 72 hours No results found for this basename: TSH,T4TOTAL,FREET3,T3FREE,THYROIDAB in the last 72 hours No results found for this basename: VITAMINB12:2,FOLATE:2,FERRITIN:2,TIBC:2,IRON:2,RETICCTPCT:2 in the last 72 hours  Micro Results: Recent Results (from the past 240 hour(s))  CULTURE, BLOOD (ROUTINE X 2)     Status: Normal   Collection Time   07/25/11  6:27 PM      Component Value Range Status Comment   Specimen Description BLOOD LEFT ARM   Final    Special Requests BOTTLES DRAWN AEROBIC AND ANAEROBIC  UNKNOWN   Final    Setup Time [id number]   Final    Culture NO GROWTH 5 DAYS   Final    Report Status 07/31/2011 FINAL   Final   CULTURE, BLOOD (ROUTINE X 2)     Status: Normal   Collection Time   07/25/11  6:30 PM      Component Value Range Status Comment   Specimen Description BLOOD RIGHT HAND  Final    Special Requests BOTTLES DRAWN AEROBIC ONLY    Final    Setup Time [id number]   Final    Culture NO GROWTH 5 DAYS   Final    Report Status 07/31/2011 FINAL   Final   URINE CULTURE     Status: Normal   Collection Time   07/25/11  6:31 PM      Component Value Range Status Comment   Specimen Description URINE, CATHETERIZED   Final    Special Requests NONE   Final    Setup Time [id number]   Final    Colony Count NO GROWTH   Final    Culture NO GROWTH   Final    Report Status 07/27/2011 FINAL   Final   CSF CULTURE      Status: Normal (Preliminary result)   Collection Time   07/25/11 11:59 PM      Component Value Range Status Comment   Specimen Description CSF   Final    Special Requests NONE   Final    Gram Stain     Final    Value: CYTOSPIN WBC PRESENT,BOTH PMN AND MONONUCLEAR     NO ORGANISMS SEEN     Gram Stain Report Called to,Read Back By and Verified With: Gram Stain Report Called to,Read Back By and Verified With: DR CAMPBELL @1016  07/26/11 BY KRAWS   Culture     Final    Value: RARE LISTERIA MONOCYTOGENES     Note: CRITICAL RESULT CALLED TO, READ BACK BY AND VERIFIED WITH: HILLARY HOLDERNESS 07/28/11 1450 BY SMITHERSJ CRITICAL RESULT CALLED TO, READ BACK BY AND VERIFIED WITH: DR CAMPBELL 07/28/11 1500 BY SMITHERSJ Standardized susceptibility testing for this      organism is not available.   Report Status PENDING   Incomplete   PATHOLOGIST SMEAR REVIEW     Status: Normal   Collection Time   07/25/11 11:59 PM      Component Value Range Status Comment   Tech Review Reviewed by H. Hollice Espy, M.D.   Final   CLOSTRIDIUM DIFFICILE BY PCR     Status: Normal   Collection Time   07/28/11  7:47 PM      Component Value Range Status Comment   C difficile by pcr NEGATIVE  NEGATIVE  Final     Studies/Results: Ct Head Wo Contrast  07/25/2011  *RADIOLOGY REPORT*  Clinical Data: Altered mental status, multiple falls  CT HEAD WITHOUT CONTRAST  Technique:  Contiguous axial images were obtained from the base of the skull through the vertex without contrast.  Comparison: [facility] MRI brain dated 12/27/2005  Findings: Motion degraded images.  No evidence of parenchymal hemorrhage or extra-axial fluid collection. No mass lesion, mass effect, or midline shift.  No CT evidence of acute infarction.  Subcortical white matter and periventricular small vessel ischemic changes.  Age related atrophy. No ventriculomegaly.  The visualized paranasal sinuses are essentially clear. The mastoid air cells are unopacified.   No evidence of calvarial fracture.  IMPRESSION: Motion degraded images.  No evidence of acute intracranial abnormality.  Atrophy with small vessel ischemic changes.  Original Report Authenticated By: Charline Bills, M.D.   Dg Chest Portable 1 View  07/25/2011  *RADIOLOGY REPORT*  Clinical Data: Fever.  Altered mental status.  PORTABLE CHEST - 1 VIEW  Comparison: 09/21/2006  Findings: Changes of COPD are again noted.  No evidence of acute infiltrate or pleural effusion.  Heart size is normal.  IMPRESSION: COPD.  No acute findings.  Original Report Authenticated By: Danae Orleans, M.D.    Medications: I have reviewed the patient's current medications. Scheduled Meds:   . ampicillin (OMNIPEN) IV  2 g Intravenous Q4H  . aspirin EC  81 mg Oral Daily  . enoxaparin  40 mg Subcutaneous Q24H  . feeding supplement  237 mL Oral BID BM  . olmesartan  40 mg Oral Daily  . pantoprazole  40 mg Oral Q breakfast  . potassium chloride  40 mEq Oral Once   Continuous Infusions:   . 0.9 % NaCl with KCl 20 mEq / L 75 mL/hr at 07/31/11 0854   PRN Meds:.acetaminophen, acetaminophen, alum & mag hydroxide-simeth, HYDROcodone-acetaminophen, LORazepam, morphine, ondansetron (ZOFRAN) IV, ondansetron, sodium chloride Assessment/Plan: Patient Active Hospital Problem List: Syncope and collapse (07/25/2011)   D?T SIRS which is now improved . SIRS (systemic inflammatory response syndrome) (07/25/2011)   Assessment: resolved    COPD (chronic obstructive pulmonary disease) (07/25/2011)   Assessment: stable   CAD (coronary artery disease) (07/25/2011)   Assessment: Pt S/P NSTEMI.    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   LOS: 6 days

## 2011-07-31 NOTE — Progress Notes (Signed)
Patient ID: Alec Walker, male   DOB: Apr 14, 1927, 77 y.o.   MRN: [id number]  INFECTIOUS DISEASE PROGRESS NOTE                 Day ampicillin        Date of Admission:  07/25/2011  Date of Initial Consult:  07/31/2011  Principal Problem:  *Syncope and collapse Active Problems:  Meningitis  SIRS (systemic inflammatory response syndrome)  COPD (chronic obstructive pulmonary disease)  CAD (coronary artery disease)  Elevated troponin      . ampicillin (OMNIPEN) IV  2 g Intravenous Q4H  . aspirin EC  81 mg Oral Daily  . enoxaparin  40 mg Subcutaneous Q24H  . feeding supplement  237 mL Oral BID BM  . olmesartan  40 mg Oral Daily  . pantoprazole  40 mg Oral Q breakfast  . potassium chloride  40 mEq Oral Once    Subjective: Feels better  Objective: Temp:  [98.1 F (36.7 C)-98.8 F (37.1 C)] 98.8 F (37.1 C) (12/13 1408) Pulse Rate:  [74-79] 79  (12/13 1408) Resp:  [20-24] 22  (12/13 1408) BP: (150-158)/(71-86) 157/86 mmHg (12/13 1408) SpO2:  [96 %-98 %] 98 % (12/13 1408)  General: alert and oriented to date and place Skin: new L arm PICC   Lab Results Lab Results  Component Value Date   WBC 5.7 07/31/2011   HGB 10.3* 07/31/2011   HCT 30.8* 07/31/2011   MCV 94.2 07/31/2011   PLT 128* 07/31/2011    Lab Results  Component Value Date   CREATININE 1.10 07/31/2011   BUN 20 07/31/2011   NA 136 07/31/2011   K 3.6 07/31/2011   CL 106 07/31/2011   CO2 23 07/31/2011    Lab Results  Component Value Date   ALT 22 07/25/2011   AST 80* 07/25/2011   ALKPHOS 54 07/25/2011   BILITOT 0.4 07/25/2011       Microbiology: Recent Results (from the past 240 hour(s))  CULTURE, BLOOD (ROUTINE X 2)     Status: Normal   Collection Time   07/25/11  6:27 PM      Component Value Range Status Comment   Specimen Description BLOOD LEFT ARM   Final    Special Requests BOTTLES DRAWN AEROBIC AND ANAEROBIC  UNKNOWN   Final    Setup Time [id number]   Final    Culture NO GROWTH 5  DAYS   Final    Report Status 07/31/2011 FINAL   Final   CULTURE, BLOOD (ROUTINE X 2)     Status: Normal   Collection Time   07/25/11  6:30 PM      Component Value Range Status Comment   Specimen Description BLOOD RIGHT HAND   Final    Special Requests BOTTLES DRAWN AEROBIC ONLY    Final    Setup Time [id number]   Final    Culture NO GROWTH 5 DAYS   Final    Report Status 07/31/2011 FINAL   Final   URINE CULTURE     Status: Normal   Collection Time   07/25/11  6:31 PM      Component Value Range Status Comment   Specimen Description URINE, CATHETERIZED   Final    Special Requests NONE   Final    Setup Time [id number]   Final    Colony Count NO GROWTH   Final    Culture NO GROWTH   Final    Report Status 07/27/2011 FINAL  Final   CSF CULTURE     Status: Normal (Preliminary result)   Collection Time   07/25/11 11:59 PM      Component Value Range Status Comment   Specimen Description CSF   Final    Special Requests NONE   Final    Gram Stain     Final    Value: CYTOSPIN WBC PRESENT,BOTH PMN AND MONONUCLEAR     NO ORGANISMS SEEN     Gram Stain Report Called to,Read Back By and Verified With: Gram Stain Report Called to,Read Back By and Verified With: DR Kashaun Bebo @1016  07/26/11 BY KRAWS   Culture     Final    Value: RARE LISTERIA MONOCYTOGENES     Note: CRITICAL RESULT CALLED TO, READ BACK BY AND VERIFIED WITH: HILLARY HOLDERNESS 07/28/11 1450 BY SMITHERSJ CRITICAL RESULT CALLED TO, READ BACK BY AND VERIFIED WITH: DR Leanord Thibeau 07/28/11 1500 BY SMITHERSJ Standardized susceptibility testing for this      organism is not available.   Report Status PENDING   Incomplete   PATHOLOGIST SMEAR REVIEW     Status: Normal   Collection Time   07/25/11 11:59 PM      Component Value Range Status Comment   Tech Review Reviewed by H. Hollice Espy, M.D.   Final   CLOSTRIDIUM DIFFICILE BY PCR     Status: Normal   Collection Time   07/28/11  7:47 PM      Component Value Range Status Comment   C  difficile by pcr NEGATIVE  NEGATIVE  Final     Studies/Results: No results found.   Assessment: His meningitis continues to improve.  Plan: 1. Continue ampicillin.   [name], MD [facility] [phone] pager   [phone] cell 07/31/2011, 4:36 PM

## 2011-08-01 DIAGNOSIS — I219 Acute myocardial infarction, unspecified: Secondary | ICD-10-CM

## 2011-08-01 DIAGNOSIS — G009 Bacterial meningitis, unspecified: Secondary | ICD-10-CM

## 2011-08-01 LAB — BASIC METABOLIC PANEL
BUN: 17 mg/dL (ref 6–23)
Creatinine, Ser: 1.18 mg/dL (ref 0.50–1.35)
GFR calc Af Amer: 65 mL/min — ABNORMAL LOW (ref >90)
GFR calc non Af Amer: 56 mL/min — ABNORMAL LOW (ref >90)
Glucose, Bld: 101 mg/dL — ABNORMAL HIGH (ref 70–99)

## 2011-08-01 MED ORDER — PRO-STAT SUGAR FREE PO LIQD
30.0000 mL | Freq: Two times a day (BID) | ORAL | Status: DC
  Administered 2011-08-01 – 2011-08-06 (×8): 30 mL via ORAL
  Filled 2011-08-01 (×13): qty 30

## 2011-08-01 MED ORDER — ALBUTEROL SULFATE (5 MG/ML) 0.5% IN NEBU
2.5000 mg | INHALATION_SOLUTION | RESPIRATORY_TRACT | Status: DC | PRN
  Administered 2011-08-01: 2.5 mg via RESPIRATORY_TRACT
  Filled 2011-08-01: qty 0.5

## 2011-08-01 NOTE — Progress Notes (Signed)
  Echocardiogram 2D Echocardiogram has been performed.  Jorje Guild [facility] 08/01/2011, 10:07 AM

## 2011-08-01 NOTE — Progress Notes (Signed)
Patient ID: Alec Walker, male   DOB: Sep 06, 1926, 77 y.o.   MRN: [id number]  INFECTIOUS DISEASE PROGRESS NOTE    Date of Admission:  07/25/2011   Day 8 ampicillin         Principal Problem:  *Syncope and collapse Active Problems:  Listeria meningitis  SIRS (systemic inflammatory response syndrome)  COPD (chronic obstructive pulmonary disease)  CAD (coronary artery disease)  Elevated troponin      . ampicillin (OMNIPEN) IV  2 g Intravenous Q4H  . aspirin EC  81 mg Oral Daily  . enoxaparin  40 mg Subcutaneous Q24H  . feeding supplement  237 mL Oral BID BM  . olmesartan  40 mg Oral Daily  . pantoprazole  40 mg Oral Q breakfast  . potassium chloride  40 mEq Oral Once    Subjective: He still does not have much appetite and is not eating very well. He denies any headache  Objective: Temp:  [98.2 F (36.8 C)-98.8 F (37.1 C)] 98.3 F (36.8 C) (12/14 1346) Pulse Rate:  [82-87] 82  (12/14 1346) Resp:  [18-26] 20  (12/14 1346) BP: (150-162)/(73-81) 157/81 mmHg (12/14 1346) SpO2:  [92 %-96 %] 95 % (12/14 1346)  General: He is alert and conversant Skin: His abrasions are healing Lungs: Clear Cor: Regular S1 and S2 no murmurs He now says that he is in the hospital because he passed out. He is oriented to place and time.  Lab Results Lab Results  Component Value Date   WBC 5.7 07/31/2011   HGB 10.3* 07/31/2011   HCT 30.8* 07/31/2011   MCV 94.2 07/31/2011   PLT 128* 07/31/2011    Lab Results  Component Value Date   CREATININE 1.18 08/01/2011   BUN 17 08/01/2011   NA 135 08/01/2011   K 3.6 08/01/2011   CL 105 08/01/2011   CO2 23 08/01/2011    Lab Results  Component Value Date   ALT 22 07/25/2011   AST 80* 07/25/2011   ALKPHOS 54 07/25/2011   BILITOT 0.4 07/25/2011       Microbiology: Recent Results (from the past 240 hour(s))  CULTURE, BLOOD (ROUTINE X 2)     Status: Normal   Collection Time   07/25/11  6:27 PM      Component Value Range Status Comment   Specimen Description BLOOD LEFT ARM   Final    Special Requests BOTTLES DRAWN AEROBIC AND ANAEROBIC  UNKNOWN   Final    Setup Time [id number]   Final    Culture NO GROWTH 5 DAYS   Final    Report Status 07/31/2011 FINAL   Final   CULTURE, BLOOD (ROUTINE X 2)     Status: Normal   Collection Time   07/25/11  6:30 PM      Component Value Range Status Comment   Specimen Description BLOOD RIGHT HAND   Final    Special Requests BOTTLES DRAWN AEROBIC ONLY    Final    Setup Time [id number]   Final    Culture NO GROWTH 5 DAYS   Final    Report Status 07/31/2011 FINAL   Final   URINE CULTURE     Status: Normal   Collection Time   07/25/11  6:31 PM      Component Value Range Status Comment   Specimen Description URINE, CATHETERIZED   Final    Special Requests NONE   Final    Setup Time [id number]   Final    Colony  Count NO GROWTH   Final    Culture NO GROWTH   Final    Report Status 07/27/2011 FINAL   Final   CSF CULTURE     Status: Normal (Preliminary result)   Collection Time   07/25/11 11:59 PM      Component Value Range Status Comment   Specimen Description CSF   Final    Special Requests NONE   Final    Gram Stain     Final    Value: CYTOSPIN WBC PRESENT,BOTH PMN AND MONONUCLEAR     NO ORGANISMS SEEN     Gram Stain Report Called to,Read Back By and Verified With: Gram Stain Report Called to,Read Back By and Verified With: DR Antasia Haider @1016  07/26/11 BY KRAWS   Culture     Final    Value: RARE LISTERIA MONOCYTOGENES     Note: CRITICAL RESULT CALLED TO, READ BACK BY AND VERIFIED WITH: HILLARY HOLDERNESS 07/28/11 1450 BY SMITHERSJ CRITICAL RESULT CALLED TO, READ BACK BY AND VERIFIED WITH: DR Mashanda Ishibashi 07/28/11 1500 BY SMITHERSJ Standardized susceptibility testing for this      organism is not available.   Report Status PENDING   Incomplete   PATHOLOGIST SMEAR REVIEW     Status: Normal   Collection Time   07/25/11 11:59 PM      Component Value Range Status Comment   Tech Review  Reviewed by H. Hollice Espy, M.D.   Final   CLOSTRIDIUM DIFFICILE BY PCR     Status: Normal   Collection Time   07/28/11  7:47 PM      Component Value Range Status Comment   C difficile by pcr NEGATIVE  NEGATIVE  Final     Studies/Results: No results found.   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 He is becoming more oriented each day. I will plan on a total of 4 weeks of IV ampicillin given his very debilitated state and the difficulty of carrying a Listeria meningitis.  Plan: 1. Continue IV ampicillin 2. Please call my partner, Dr. Jerolyn Center [phone], for any questions this weekend.   [name], MD [facility] [phone] pager   [phone] cell 08/01/2011, 4:12 PM

## 2011-08-01 NOTE — Progress Notes (Signed)
Nutrition Follow-up  Pt sleeping at time of visit.  Discussed with RN who states pt is not eating well, eating a few bites at meal times.  Pt did drink an Ensure today with a great deal of encouragement and prompting from RN.  Diet Order:  Heart Healthy  Meds: Scheduled Meds:   . ampicillin (OMNIPEN) IV  2 g Intravenous Q4H  . aspirin EC  81 mg Oral Daily  . enoxaparin  40 mg Subcutaneous Q24H  . feeding supplement  237 mL Oral BID BM  . olmesartan  40 mg Oral Daily  . pantoprazole  40 mg Oral Q breakfast  . potassium chloride  40 mEq Oral Once   Continuous Infusions:   . 0.9 % NaCl with KCl 20 mEq / L 75 mL/hr at 08/01/11 1508   PRN Meds:.acetaminophen, acetaminophen, albuterol, alum & mag hydroxide-simeth, HYDROcodone-acetaminophen, LORazepam, morphine, ondansetron (ZOFRAN) IV, ondansetron, sodium chloride  Labs:  CMP     Component Value Date/Time   NA 135 08/01/2011 0620   K 3.6 08/01/2011 0620   CL 105 08/01/2011 0620   CO2 23 08/01/2011 0620   GLUCOSE 101* 08/01/2011 0620   BUN 17 08/01/2011 0620   CREATININE 1.18 08/01/2011 0620   CALCIUM 7.7* 08/01/2011 0620   PROT 7.1 07/25/2011 1833   ALBUMIN 3.3* 07/25/2011 1833   AST 80* 07/25/2011 1833   ALT 22 07/25/2011 1833   ALKPHOS 54 07/25/2011 1833   BILITOT 0.4 07/25/2011 1833   GFRNONAA 56* 08/01/2011 0620   GFRAA 65* 08/01/2011 0620     Intake/Output Summary (Last 24 hours) at 08/01/11 1608 Last data filed at 08/01/11 1505  Gross per 24 hour  Intake 3448.75 ml  Output   2552 ml  Net 896.75 ml    Weight Status:  No new wt. Last known wt (07/27/11):  119 lbs  Nutrition Dx:  Inadequate oral intake, ongoing  Intervention:   Supplements; Prostat BID.  May mix with juice if pt prefers.  Monitor:  Pt not meeting goal of >50% of meals consumed. Monitor for improvement in intake.  Pt to consume supplements if unable to eat adequately at meals.   Hoyt Koch Pager #:  [phone].

## 2011-08-01 NOTE — Progress Notes (Signed)
Subjective Pt is alert and oriented x 3 today. He does not remember the events of yesterday. Objective: Filed Vitals:   08/01/11 0434 08/01/11 0505 08/01/11 0518 08/01/11 1346  BP: 150/80 162/80  157/81  Pulse: 84 87  82  Temp: 98.3 F (36.8 C) 98.2 F (36.8 C)  98.3 F (36.8 C)  TempSrc: Oral Oral  Oral  Resp: 20 18  20   Height:      Weight:      SpO2: 95% 92% 95% 95%   Weight change:   Intake/Output Summary (Last 24 hours) at 08/01/11 1848 Last data filed at 08/01/11 1800  Gross per 24 hour  Intake 3628.75 ml  Output   3052 ml  Net 576.75 ml    General: Alert, awake, oriented x3, in no acute distress.  HEENT: [place]/AT PEERL, EOMI Neck: Trachea midline,  no masses, no thyromegal,y no JVD, no carotid bruit OROPHARYNX:  Moist, No exudate/ erythema/lesions.  Heart: Regular rate and rhythm, without murmurs, rubs, gallops, PMI non-displaced, no heaves or thrills on palpation.  Lungs:Transmitted upper airway sounds. Rhonchi noted in BLL.  No increased vocal fremitus resonant to percussion  Abdomen: Soft, nontender, nondistended, positive bowel sounds, no masses no hepatosplenomegaly noted..  Neuro: No focal neurological deficits noted cranial nerves II through XII grossly intact. DTRs 2+ bilaterally upper and lower extremities. Strength 5 out of 5 in bilateral upper and lower extremities. Musculoskeletal: No warm swelling or erythema around joints, no spinal tenderness noted. Psychiatric: Patient alert and oriented x3, good insight and cognition, good recent to remote recall. Lymph node survey: No cervical axillary or inguinal lymphadenopathy noted.     Lab Results:  [facility] 08/01/11 0620 07/31/11 0550  NA 135 136  K 3.6 3.6  CL 105 106  CO2 23 23  GLUCOSE 101* 98  BUN 17 20  CREATININE 1.18 1.10  CALCIUM 7.7* 7.7*  MG -- --  PHOS -- --   No results found for this basename: AST:2,ALT:2,ALKPHOS:2,BILITOT:2,PROT:2,ALBUMIN:2 in the last 72 hours No results found for  this basename: LIPASE:2,AMYLASE:2 in the last 72 hours  Basename 07/31/11 0550  WBC 5.7  NEUTROABS --  HGB 10.3*  HCT 30.8*  MCV 94.2  PLT 128*   No results found for this basename: CKTOTAL:3,CKMB:3,CKMBINDEX:3,TROPONINI:3 in the last 72 hours No components found with this basename: POCBNP:3 No results found for this basename: DDIMER:2 in the last 72 hours No results found for this basename: HGBA1C:2 in the last 72 hours No results found for this basename: CHOL:2,HDL:2,LDLCALC:2,TRIG:2,CHOLHDL:2,LDLDIRECT:2 in the last 72 hours No results found for this basename: TSH,T4TOTAL,FREET3,T3FREE,THYROIDAB in the last 72 hours No results found for this basename: VITAMINB12:2,FOLATE:2,FERRITIN:2,TIBC:2,IRON:2,RETICCTPCT:2 in the last 72 hours  Micro Results: Recent Results (from the past 240 hour(s))  CULTURE, BLOOD (ROUTINE X 2)     Status: Normal   Collection Time   07/25/11  6:27 PM      Component Value Range Status Comment   Specimen Description BLOOD LEFT ARM   Final    Special Requests BOTTLES DRAWN AEROBIC AND ANAEROBIC  UNKNOWN   Final    Setup Time [id number]   Final    Culture NO GROWTH 5 DAYS   Final    Report Status 07/31/2011 FINAL   Final   CULTURE, BLOOD (ROUTINE X 2)     Status: Normal   Collection Time   07/25/11  6:30 PM      Component Value Range Status Comment   Specimen Description BLOOD RIGHT HAND  Final    Special Requests BOTTLES DRAWN AEROBIC ONLY    Final    Setup Time [id number]   Final    Culture NO GROWTH 5 DAYS   Final    Report Status 07/31/2011 FINAL   Final   URINE CULTURE     Status: Normal   Collection Time   07/25/11  6:31 PM      Component Value Range Status Comment   Specimen Description URINE, CATHETERIZED   Final    Special Requests NONE   Final    Setup Time [id number]   Final    Colony Count NO GROWTH   Final    Culture NO GROWTH   Final    Report Status 07/27/2011 FINAL   Final   CSF CULTURE     Status: Normal (Preliminary  result)   Collection Time   07/25/11 11:59 PM      Component Value Range Status Comment   Specimen Description CSF   Final    Special Requests NONE   Final    Gram Stain     Final    Value: CYTOSPIN WBC PRESENT,BOTH PMN AND MONONUCLEAR     NO ORGANISMS SEEN     Gram Stain Report Called to,Read Back By and Verified With: Gram Stain Report Called to,Read Back By and Verified With: DR CAMPBELL @1016  07/26/11 BY KRAWS   Culture     Final    Value: RARE LISTERIA MONOCYTOGENES     Note: CRITICAL RESULT CALLED TO, READ BACK BY AND VERIFIED WITH: HILLARY HOLDERNESS 07/28/11 1450 BY SMITHERSJ CRITICAL RESULT CALLED TO, READ BACK BY AND VERIFIED WITH: DR CAMPBELL 07/28/11 1500 BY SMITHERSJ Standardized susceptibility testing for this      organism is not available.   Report Status PENDING   Incomplete   PATHOLOGIST SMEAR REVIEW     Status: Normal   Collection Time   07/25/11 11:59 PM      Component Value Range Status Comment   Tech Review Reviewed by H. Hollice Espy, M.D.   Final   CLOSTRIDIUM DIFFICILE BY PCR     Status: Normal   Collection Time   07/28/11  7:47 PM      Component Value Range Status Comment   C difficile by pcr NEGATIVE  NEGATIVE  Final     Studies/Results: Ct Head Wo Contrast  07/25/2011  *RADIOLOGY REPORT*  Clinical Data: Altered mental status, multiple falls  CT HEAD WITHOUT CONTRAST  Technique:  Contiguous axial images were obtained from the base of the skull through the vertex without contrast.  Comparison: [facility] MRI brain dated 12/27/2005  Findings: Motion degraded images.  No evidence of parenchymal hemorrhage or extra-axial fluid collection. No mass lesion, mass effect, or midline shift.  No CT evidence of acute infarction.  Subcortical white matter and periventricular small vessel ischemic changes.  Age related atrophy. No ventriculomegaly.  The visualized paranasal sinuses are essentially clear. The mastoid air cells are unopacified.  No evidence of calvarial  fracture.  IMPRESSION: Motion degraded images.  No evidence of acute intracranial abnormality.  Atrophy with small vessel ischemic changes.  Original Report Authenticated By: Charline Bills, M.D.   Dg Chest Portable 1 View  07/25/2011  *RADIOLOGY REPORT*  Clinical Data: Fever.  Altered mental status.  PORTABLE CHEST - 1 VIEW  Comparison: 09/21/2006  Findings: Changes of COPD are again noted.  No evidence of acute infiltrate or pleural effusion.  Heart size is normal.  IMPRESSION: COPD.  No acute findings.  Original Report Authenticated By: Danae Orleans, M.D.    Medications: I have reviewed the patient's current medications. Scheduled Meds:    . ampicillin (OMNIPEN) IV  2 g Intravenous Q4H  . aspirin EC  81 mg Oral Daily  . enoxaparin  40 mg Subcutaneous Q24H  . feeding supplement  237 mL Oral BID BM  . feeding supplement  30 mL Oral BID  . olmesartan  40 mg Oral Daily  . pantoprazole  40 mg Oral Q breakfast  . potassium chloride  40 mEq Oral Once   Continuous Infusions:    . 0.9 % NaCl with KCl 20 mEq / L 75 mL/hr at 08/01/11 1508   PRN Meds:.acetaminophen, acetaminophen, albuterol, alum & mag hydroxide-simeth, HYDROcodone-acetaminophen, LORazepam, morphine, ondansetron (ZOFRAN) IV, ondansetron, sodium chloride Assessment/Plan: Patient Active Hospital Problem List: Syncope and collapse (07/25/2011)   D/T SIRS which is now improved . SIRS (systemic inflammatory response syndrome) (07/25/2011)   Assessment: resolved    COPD (chronic obstructive pulmonary disease) (07/25/2011)   Assessment: stable   CAD (coronary artery disease) (07/25/2011)   Assessment: Pt S/P NSTEMI.    Plan: Will get 2D-Echo to evaluate LVF  Elevated troponin (07/25/2011)   Assessment: NSTEMI   Plan: See above  Meningitis (07/26/2011)   Assessment: Listeria    Plan: Con't Ampicillin per Dr. Blair Dolphin recommendations  LOS: 7 days

## 2011-08-01 NOTE — Progress Notes (Signed)
PT Cancellation Note  ___Treatment cancelled today due to medical issues with patient which prohibited therapy  ___ Treatment cancelled today due to patient receiving procedure or test   __X_ Treatment cancelled today due to patient's refusal to participate -- pt requesting sleep and declined therapy today  ___ Treatment cancelled today due to   Signature: Tamala Ser, PT, DPT Pager: [phone] 08/01/2011

## 2011-08-01 NOTE — Progress Notes (Signed)
CSW spoke w/pts daughter, Aggie Cosier, per her request. She would like CSW to fax patient out to facilities and get bed offers so that her and pts spouse can choose a facility. CSW did same. FL-2 completed and faxed. CSW awaiting bed offers. Chaniqua Brisby C. Ardel Jagger MSW, LCSW [phone]

## 2011-08-02 NOTE — Progress Notes (Signed)
Subjective Pt is much more alert and oriented today. He remembered me from yesterday. States that he feels tired an tus is reluctant to sit up in the chair. Objective: Filed Vitals:   08/01/11 1346 08/01/11 2140 08/02/11 0509 08/02/11 1514  BP: 157/81 147/74 171/89 153/81  Pulse: 82 71 77 78  Temp: 98.3 F (36.8 C) 98.7 F (37.1 C) 98 F (36.7 C) 97.4 F (36.3 C)  TempSrc: Oral Oral Oral Oral  Resp: 20 18 18 20   Height:      Weight:      SpO2: 95% 98% 96% 95%   Weight change:   Intake/Output Summary (Last 24 hours) at 08/02/11 1600 Last data filed at 08/02/11 1330  Gross per 24 hour  Intake 2646.25 ml  Output   1275 ml  Net 1371.25 ml    General: Alert, awake, oriented x3, in no acute distress.  HEENT: [place]/AT PEERL, EOMI Neck: Trachea midline,  no masses, no thyromegal,y no JVD, no carotid bruit OROPHARYNX:  Moist, No exudate/ erythema/lesions.  Heart: Regular rate and rhythm, without murmurs, rubs, gallops, PMI non-displaced, no heaves or thrills on palpation.  Lungs:Transmitted upper airway sounds. Rhonchi noted in BLL.  No increased vocal fremitus resonant to percussion  Abdomen: Soft, nontender, nondistended, positive bowel sounds, no masses no hepatosplenomegaly noted..  Neuro: No focal neurological deficits noted cranial nerves II through XII grossly intact. DTRs 2+ bilaterally upper and lower extremities. Strength 3+/5 in bilateral upper and lower extremities. Musculoskeletal: No warm swelling or erythema around joints, no spinal tenderness noted. Psychiatric: Patient alert and oriented x3, good insight and cognition, good recent to remote recall. Lymph node survey: No cervical axillary or inguinal lymphadenopathy noted.     Lab Results:  [facility] 08/01/11 0620 07/31/11 0550  NA 135 136  K 3.6 3.6  CL 105 106  CO2 23 23  GLUCOSE 101* 98  BUN 17 20  CREATININE 1.18 1.10  CALCIUM 7.7* 7.7*  MG -- --  PHOS -- --   No results found for this basename:  AST:2,ALT:2,ALKPHOS:2,BILITOT:2,PROT:2,ALBUMIN:2 in the last 72 hours No results found for this basename: LIPASE:2,AMYLASE:2 in the last 72 hours  Basename 07/31/11 0550  WBC 5.7  NEUTROABS --  HGB 10.3*  HCT 30.8*  MCV 94.2  PLT 128*   No results found for this basename: CKTOTAL:3,CKMB:3,CKMBINDEX:3,TROPONINI:3 in the last 72 hours No components found with this basename: POCBNP:3 No results found for this basename: DDIMER:2 in the last 72 hours No results found for this basename: HGBA1C:2 in the last 72 hours No results found for this basename: CHOL:2,HDL:2,LDLCALC:2,TRIG:2,CHOLHDL:2,LDLDIRECT:2 in the last 72 hours No results found for this basename: TSH,T4TOTAL,FREET3,T3FREE,THYROIDAB in the last 72 hours No results found for this basename: VITAMINB12:2,FOLATE:2,FERRITIN:2,TIBC:2,IRON:2,RETICCTPCT:2 in the last 72 hours  Micro Results: Recent Results (from the past 240 hour(s))  CULTURE, BLOOD (ROUTINE X 2)     Status: Normal   Collection Time   07/25/11  6:27 PM      Component Value Range Status Comment   Specimen Description BLOOD LEFT ARM   Final    Special Requests BOTTLES DRAWN AEROBIC AND ANAEROBIC  UNKNOWN   Final    Setup Time [id number]   Final    Culture NO GROWTH 5 DAYS   Final    Report Status 07/31/2011 FINAL   Final   CULTURE, BLOOD (ROUTINE X 2)     Status: Normal   Collection Time   07/25/11  6:30 PM      Component Value  Range Status Comment   Specimen Description BLOOD RIGHT HAND   Final    Special Requests BOTTLES DRAWN AEROBIC ONLY    Final    Setup Time [id number]   Final    Culture NO GROWTH 5 DAYS   Final    Report Status 07/31/2011 FINAL   Final   URINE CULTURE     Status: Normal   Collection Time   07/25/11  6:31 PM      Component Value Range Status Comment   Specimen Description URINE, CATHETERIZED   Final    Special Requests NONE   Final    Setup Time [id number]   Final    Colony Count NO GROWTH   Final    Culture NO GROWTH   Final      Report Status 07/27/2011 FINAL   Final   CSF CULTURE     Status: Normal (Preliminary result)   Collection Time   07/25/11 11:59 PM      Component Value Range Status Comment   Specimen Description CSF   Final    Special Requests NONE   Final    Gram Stain     Final    Value: CYTOSPIN WBC PRESENT,BOTH PMN AND MONONUCLEAR     NO ORGANISMS SEEN     Gram Stain Report Called to,Read Back By and Verified With: Gram Stain Report Called to,Read Back By and Verified With: DR CAMPBELL @1016  07/26/11 BY KRAWS   Culture     Final    Value: RARE LISTERIA MONOCYTOGENES     Note: CRITICAL RESULT CALLED TO, READ BACK BY AND VERIFIED WITH: HILLARY HOLDERNESS 07/28/11 1450 BY SMITHERSJ CRITICAL RESULT CALLED TO, READ BACK BY AND VERIFIED WITH: DR CAMPBELL 07/28/11 1500 BY SMITHERSJ Standardized susceptibility testing for this      organism is not available.   Report Status PENDING   Incomplete   PATHOLOGIST SMEAR REVIEW     Status: Normal   Collection Time   07/25/11 11:59 PM      Component Value Range Status Comment   Tech Review Reviewed by H. Hollice Espy, M.D.   Final   CLOSTRIDIUM DIFFICILE BY PCR     Status: Normal   Collection Time   07/28/11  7:47 PM      Component Value Range Status Comment   C difficile by pcr NEGATIVE  NEGATIVE  Final     Studies/Results: Ct Head Wo Contrast  07/25/2011  *RADIOLOGY REPORT*  Clinical Data: Altered mental status, multiple falls  CT HEAD WITHOUT CONTRAST  Technique:  Contiguous axial images were obtained from the base of the skull through the vertex without contrast.  Comparison: [facility] MRI brain dated 12/27/2005  Findings: Motion degraded images.  No evidence of parenchymal hemorrhage or extra-axial fluid collection. No mass lesion, mass effect, or midline shift.  No CT evidence of acute infarction.  Subcortical white matter and periventricular small vessel ischemic changes.  Age related atrophy. No ventriculomegaly.  The visualized paranasal sinuses  are essentially clear. The mastoid air cells are unopacified.  No evidence of calvarial fracture.  IMPRESSION: Motion degraded images.  No evidence of acute intracranial abnormality.  Atrophy with small vessel ischemic changes.  Original Report Authenticated By: Charline Bills, M.D.   Dg Chest Portable 1 View  07/25/2011  *RADIOLOGY REPORT*  Clinical Data: Fever.  Altered mental status.  PORTABLE CHEST - 1 VIEW  Comparison: 09/21/2006  Findings: Changes of COPD are again noted.  No evidence of acute  infiltrate or pleural effusion.  Heart size is normal.  IMPRESSION: COPD.  No acute findings.  Original Report Authenticated By: Danae Orleans, M.D.    Medications: I have reviewed the patient's current medications. Scheduled Meds:    . ampicillin (OMNIPEN) IV  2 g Intravenous Q4H  . aspirin EC  81 mg Oral Daily  . enoxaparin  40 mg Subcutaneous Q24H  . feeding supplement  237 mL Oral BID BM  . feeding supplement  30 mL Oral BID  . olmesartan  40 mg Oral Daily  . pantoprazole  40 mg Oral Q breakfast  . potassium chloride  40 mEq Oral Once   Continuous Infusions:    . 0.9 % NaCl with KCl 20 mEq / L 75 mL/hr at 08/02/11 1005   PRN Meds:.acetaminophen, acetaminophen, albuterol, alum & mag hydroxide-simeth, HYDROcodone-acetaminophen, LORazepam, morphine, ondansetron (ZOFRAN) IV, ondansetron, sodium chloride Assessment/Plan: Patient Active Hospital Problem List: Syncope and collapse (07/25/2011)   D/T SIRS which is now improved . SIRS (systemic inflammatory response syndrome) (07/25/2011)   Assessment: resolved    COPD (chronic obstructive pulmonary disease) (07/25/2011)   Assessment: stable   CAD (coronary artery disease) (07/25/2011)   Assessment: Pt S/P NSTEMI.    Plan: 2D-Echo results reviewed. Continue Benicar. Elevated troponin (07/25/2011)   Assessment: NSTEMI   Plan: medical management.  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 Blair Dolphin  recommendations  Post discharge disposition is SNF for rehab.  LOS: 8 days

## 2011-08-03 LAB — BASIC METABOLIC PANEL
BUN: 17 mg/dL (ref 6–23)
Chloride: 103 mEq/L (ref 96–112)
Creatinine, Ser: 1 mg/dL (ref 0.50–1.35)
GFR calc Af Amer: 79 mL/min — ABNORMAL LOW (ref >90)
GFR calc non Af Amer: 68 mL/min — ABNORMAL LOW (ref >90)

## 2011-08-03 LAB — URINALYSIS, ROUTINE W REFLEX MICROSCOPIC
Bilirubin Urine: NEGATIVE
Ketones, ur: NEGATIVE mg/dL
Leukocytes, UA: NEGATIVE
Nitrite: NEGATIVE
Protein, ur: NEGATIVE mg/dL
Urobilinogen, UA: 0.2 mg/dL (ref 0.0–1.0)
pH: 6.5 (ref 5.0–8.0)

## 2011-08-03 LAB — DIFFERENTIAL
Basophils Absolute: 0 10*3/uL (ref 0.0–0.1)
Basophils Relative: 0 % (ref 0–1)
Eosinophils Absolute: 0.3 10*3/uL (ref 0.0–0.7)
Monocytes Absolute: 1 10*3/uL (ref 0.1–1.0)
Monocytes Relative: 8 % (ref 3–12)
Neutro Abs: 10.1 10*3/uL — ABNORMAL HIGH (ref 1.7–7.7)
Neutrophils Relative %: 83 % — ABNORMAL HIGH (ref 43–77)

## 2011-08-03 LAB — CBC
MCH: 32 pg (ref 26.0–34.0)
MCHC: 33.7 g/dL (ref 30.0–36.0)
RDW: 13.5 % (ref 11.5–15.5)

## 2011-08-03 NOTE — Progress Notes (Signed)
Subjective Alec Walker sat up in the chair for several hours today. Spoke with Alec Walker's daughter and updated her on Alec Walker's condition. Alec Walker to do some dizziness with standing. Otherwise no complaints. Objective: Filed Vitals:   08/02/11 1514 08/02/11 2210 08/03/11 0516 08/03/11 1415  BP: 153/81 144/65 157/75 143/79  Pulse: 78 73 73 92  Temp: 97.4 F (36.3 C) 98.1 F (36.7 C) 98 F (36.7 C) 98.8 F (37.1 C)  TempSrc: Oral Oral Oral Oral  Resp: 20 18 18 19   Height:      Weight:      SpO2: 95% 95% 96% 97%   Weight change:   Intake/Output Summary (Last 24 hours) at 08/03/11 1744 Last data filed at 08/03/11 1715  Gross per 24 hour  Intake    900 ml  Output   2602 ml  Net  -1702 ml    General: Alert, awake, oriented x3, in no acute distress.  HEENT: [place]/AT [place]ER[place], EOMI Neck: Trachea midline,  no masses, no thyromegal,y no JVD, no carotid bruit OROPHARYNX:  Moist, No exudate/ erythema/lesions.  Heart: Regular rate and rhythm, without murmurs, rubs, gallops, PMI non-displaced, no heaves or thrills on palpation.  Lungs:Transmitted upper airway sounds. Rhonchi noted in BLL.  No increased vocal fremitus resonant to percussion  Abdomen: Soft, nontender, nondistended, positive bowel sounds, no masses no hepatosplenomegaly noted..  Neuro: No focal neurological deficits noted cranial nerves II through XII grossly intact. DTRs 2+ bilaterally upper and lower extremities. Strength 3+/5 in bilateral upper and lower extremities. Musculoskeletal: No warm swelling or erythema around joints, no spinal tenderness noted. Psychiatric: Patient alert and oriented x3, good insight and cognition, good recent to remote recall. Lymph node survey: No cervical axillary or inguinal lymphadenopathy noted.     Lab Results:  Basename 08/03/11 1000 08/01/11 0620  NA 133* 135  K 3.8 3.6  CL 103 105  CO2 23 23  GLUCOSE 115* 101*  BUN 17 17  CREATININE 1.00 1.18  CALCIUM 8.1* 7.7*  MG -- --  PHOS -- --   No results found  for this basename: AST:2,ALT:2,ALKPHOS:2,BILITOT:2,PROT:2,ALBUMIN:2 in the last 72 hours No results found for this basename: LIPASE:2,AMYLASE:2 in the last 72 hours  Basename 08/03/11 1000  WBC 12.2*  NEUTROABS 10.1*  HGB 11.4*  HCT 33.8*  MCV 94.9  PLT 234   No results found for this basename: CKTOTAL:3,CKMB:3,CKMBINDEX:3,TROPONINI:3 in the last 72 hours No components found with this basename: POCBNP:3 No results found for this basename: DDIMER:2 in the last 72 hours No results found for this basename: HGBA1C:2 in the last 72 hours No results found for this basename: CHOL:2,HDL:2,LDLCALC:2,TRIG:2,CHOLHDL:2,LDLDIRECT:2 in the last 72 hours No results found for this basename: TSH,T4TOTAL,FREET3,T3FREE,THYROIDAB in the last 72 hours No results found for this basename: VITAMINB12:2,FOLATE:2,FERRITIN:2,TIBC:2,IRON:2,RETICCTPCT:2 in the last 72 hours  Micro Results: Recent Results (from the past 240 hour(s))  CULTURE, BLOOD (ROUTINE X 2)     Status: Normal   Collection Time   07/25/11  6:27 PM      Component Value Range Status Comment   Specimen Description BLOOD LEFT ARM   Final    Special Requests BOTTLES DRAWN AEROBIC AND ANAEROBIC  UNKNOWN   Final    Setup Time [id number]   Final    Culture NO GROWTH 5 DAYS   Final    Report Status 07/31/2011 FINAL   Final   CULTURE, BLOOD (ROUTINE X 2)     Status: Normal   Collection Time   07/25/11  6:30 PM  Component Value Range Status Comment   Specimen Description BLOOD RIGHT HAND   Final    Special Requests BOTTLES DRAWN AEROBIC ONLY    Final    Setup Time [id number]   Final    Culture NO GROWTH 5 DAYS   Final    Report Status 07/31/2011 FINAL   Final   URINE CULTURE     Status: Normal   Collection Time   07/25/11  6:31 PM      Component Value Range Status Comment   Specimen Description URINE, CATHETERIZED   Final    Special Requests NONE   Final    Setup Time [id number]   Final    Colony Count NO GROWTH   Final     Culture NO GROWTH   Final    Report Status 07/27/2011 FINAL   Final   CSF CULTURE     Status: Normal (Preliminary result)   Collection Time   07/25/11 11:59 PM      Component Value Range Status Comment   Specimen Description CSF   Final    Special Requests NONE   Final    Gram Stain     Final    Value: CYTOSPIN WBC PRESENT,BOTH PMN AND MONONUCLEAR     NO ORGANISMS SEEN     Gram Stain Report Called to,Read Back By and Verified With: Gram Stain Report Called to,Read Back By and Verified With: DR CAMPBELL @1016  07/26/11 BY KRAWS   Culture     Final    Value: RARE LISTERIA MONOCYTOGENES     Note: CRITICAL RESULT CALLED TO, READ BACK BY AND VERIFIED WITH: HILLARY HOLDERNESS 07/28/11 1450 BY SMITHERSJ CRITICAL RESULT CALLED TO, READ BACK BY AND VERIFIED WITH: DR CAMPBELL 07/28/11 1500 BY SMITHERSJ Standardized susceptibility testing for this      organism is not available.   Report Status PENDING   Incomplete   PATHOLOGIST SMEAR REVIEW     Status: Normal   Collection Time   07/25/11 11:59 PM      Component Value Range Status Comment   Tech Review Reviewed by H. Hollice Espy, M.D.   Final   CLOSTRIDIUM DIFFICILE BY PCR     Status: Normal   Collection Time   07/28/11  7:47 PM      Component Value Range Status Comment   C difficile by pcr NEGATIVE  NEGATIVE  Final     Studies/Results: Ct Head Wo Contrast  07/25/2011  *RADIOLOGY REPORT*  Clinical Data: Altered mental status, multiple falls  CT HEAD WITHOUT CONTRAST  Technique:  Contiguous axial images were obtained from the base of the skull through the vertex without contrast.  Comparison: [facility] MRI brain dated 12/27/2005  Findings: Motion degraded images.  No evidence of parenchymal hemorrhage or extra-axial fluid collection. No mass lesion, mass effect, or midline shift.  No CT evidence of acute infarction.  Subcortical white matter and periventricular small vessel ischemic changes.  Age related atrophy. No ventriculomegaly.  The  visualized paranasal sinuses are essentially clear. The mastoid air cells are unopacified.  No evidence of calvarial fracture.  IMPRESSION: Motion degraded images.  No evidence of acute intracranial abnormality.  Atrophy with small vessel ischemic changes.  Original Report Authenticated By: Charline Bills, M.D.   Dg Chest Portable 1 View  07/25/2011  *RADIOLOGY REPORT*  Clinical Data: Fever.  Altered mental status.  PORTABLE CHEST - 1 VIEW  Comparison: 09/21/2006  Findings: Changes of COPD are again noted.  No evidence of  acute infiltrate or pleural effusion.  Heart size is normal.  IMPRESSION: COPD.  No acute findings.  Original Report Authenticated By: Danae Orleans, M.D.    Medications: I have reviewed the patient's current medications. Scheduled Meds:    . ampicillin (OMNIPEN) IV  2 g Intravenous Q4H  . aspirin EC  81 mg Oral Daily  . enoxaparin  40 mg Subcutaneous Q24H  . feeding supplement  237 mL Oral BID BM  . feeding supplement  30 mL Oral BID  . olmesartan  40 mg Oral Daily  . pantoprazole  40 mg Oral Q breakfast  . potassium chloride  40 mEq Oral Once   Continuous Infusions:    . 0.9 % NaCl with KCl 20 mEq / L 75 mL/hr at 08/02/11 1005   PRN Meds:.acetaminophen, acetaminophen, albuterol, alum & mag hydroxide-simeth, HYDROcodone-acetaminophen, LORazepam, morphine, ondansetron (ZOFRAN) IV, ondansetron, sodium chloride Assessment/Plan: Patient Active Hospital Problem List: Syncope and collapse (07/25/2011)   the patient had a complaint of syncope prior to admission. It was felt that this is likely secondary to his meningitis however with new complaint of dizziness, I will check orthostatics on this patient.    COPD (chronic obstructive pulmonary disease) (07/25/2011)   Assessment: stable   CAD (coronary artery disease) (07/25/2011)   Assessment: Alec Walker S/P NSTEMI.    Plan: 2D-Echo results reviewed. Continue Benicar.  Elevated troponin (07/25/2011)   Assessment: NSTEMI   Plan:  medical management.  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 Blair Dolphin recommendations  Post discharge disposition is SNF for rehab.  LOS: 9 days

## 2011-08-04 LAB — DIFFERENTIAL
Basophils Absolute: 0 10*3/uL (ref 0.0–0.1)
Basophils Relative: 0 % (ref 0–1)
Eosinophils Relative: 4 % (ref 0–5)
Lymphocytes Relative: 10 % — ABNORMAL LOW (ref 12–46)
Lymphs Abs: 0.9 10*3/uL (ref 0.7–4.0)
Monocytes Relative: 7 % (ref 3–12)
Neutro Abs: 6.9 10*3/uL (ref 1.7–7.7)

## 2011-08-04 LAB — CSF CULTURE W GRAM STAIN

## 2011-08-04 LAB — CBC
HCT: 29.9 % — ABNORMAL LOW (ref 39.0–52.0)
Hemoglobin: 10 g/dL — ABNORMAL LOW (ref 13.0–17.0)
MCV: 94 fL (ref 78.0–100.0)
RBC: 3.18 MIL/uL — ABNORMAL LOW (ref 4.22–5.81)
RDW: 13.5 % (ref 11.5–15.5)
WBC: 8.7 10*3/uL (ref 4.0–10.5)

## 2011-08-04 MED ORDER — HYDROCHLOROTHIAZIDE 12.5 MG PO CAPS
12.5000 mg | ORAL_CAPSULE | Freq: Every day | ORAL | Status: DC
  Administered 2011-08-04 – 2011-08-06 (×3): 12.5 mg via ORAL
  Filled 2011-08-04 (×4): qty 1

## 2011-08-04 NOTE — Progress Notes (Signed)
Subjective Pt had no complaints of dizziness today. He was more conversant during her visit today. Objective: Filed Vitals:   08/03/11 2200 08/03/11 2216 08/04/11 0538 08/04/11 1345  BP: 180/72 152/76 165/76 170/81  Pulse: 75  73 88  Temp: 98.6 F (37 C)  97.9 F (36.6 C) 99 F (37.2 C)  TempSrc: Oral  Oral Oral  Resp: 18  20 19   Height:      Weight:      SpO2: 97%  95% 97%   Weight change:   Intake/Output Summary (Last 24 hours) at 08/04/11 1820 Last data filed at 08/04/11 1300  Gross per 24 hour  Intake 3632.5 ml  Output   2120 ml  Net 1512.5 ml    General: Alert, awake, oriented x3, in no acute distress.  HEENT: [place]/AT [place], EOMI Neck: Trachea midline,  no masses, no thyromegal,y no JVD, no carotid bruit OROPHARYNX:  Moist, No exudate/ erythema/lesions.  Heart: Regular rate and rhythm, without murmurs, rubs, gallops, PMI non-displaced, no heaves or thrills on palpation.  Lungs:Transmitted upper airway sounds. Rhonchi noted in BLL.  No increased vocal fremitus resonant to percussion  Abdomen: Soft, nontender, nondistended, positive bowel sounds, no masses no hepatosplenomegaly noted..  Neuro: No focal neurological deficits noted cranial nerves II through XII grossly intact. DTRs 2+ bilaterally upper and lower extremities. Strength 3+/5 in bilateral upper and lower extremities. Musculoskeletal: No warm swelling or erythema around joints, no spinal tenderness noted. Psychiatric: Patient alert and oriented x3, good insight and cognition, good recent to remote recall. Lymph node survey: No cervical axillary or inguinal lymphadenopathy noted.     Lab Results:  Basename 08/03/11 1000  NA 133*  K 3.8  CL 103  CO2 23  GLUCOSE 115*  BUN 17  CREATININE 1.00  CALCIUM 8.1*  MG --  PHOS --   No results found for this basename: AST:2,ALT:2,ALKPHOS:2,BILITOT:2,PROT:2,ALBUMIN:2 in the last 72 hours No results found for this basename: LIPASE:2,AMYLASE:2 in the last 72  hours  Basename 08/04/11 0419 08/03/11 1000  WBC 8.7 12.2*  NEUTROABS 6.9 10.1*  HGB 10.0* 11.4*  HCT 29.9* 33.8*  MCV 94.0 94.9  PLT 213 234   No results found for this basename: CKTOTAL:3,CKMB:3,CKMBINDEX:3,TROPONINI:3 in the last 72 hours No components found with this basename: POCBNP:3 No results found for this basename: DDIMER:2 in the last 72 hours No results found for this basename: HGBA1C:2 in the last 72 hours No results found for this basename: CHOL:2,HDL:2,LDLCALC:2,TRIG:2,CHOLHDL:2,LDLDIRECT:2 in the last 72 hours No results found for this basename: TSH,T4TOTAL,FREET3,T3FREE,THYROIDAB in the last 72 hours No results found for this basename: VITAMINB12:2,FOLATE:2,FERRITIN:2,TIBC:2,IRON:2,RETICCTPCT:2 in the last 72 hours  Micro Results: Recent Results (from the past 240 hour(s))  CULTURE, BLOOD (ROUTINE X 2)     Status: Normal   Collection Time   07/25/11  6:27 PM      Component Value Range Status Comment   Specimen Description BLOOD LEFT ARM   Final    Special Requests BOTTLES DRAWN AEROBIC AND ANAEROBIC  UNKNOWN   Final    Setup Time [id number]   Final    Culture NO GROWTH 5 DAYS   Final    Report Status 07/31/2011 FINAL   Final   CULTURE, BLOOD (ROUTINE X 2)     Status: Normal   Collection Time   07/25/11  6:30 PM      Component Value Range Status Comment   Specimen Description BLOOD RIGHT HAND   Final    Special Requests BOTTLES DRAWN  AEROBIC ONLY    Final    Setup Time [id number]   Final    Culture NO GROWTH 5 DAYS   Final    Report Status 07/31/2011 FINAL   Final   URINE CULTURE     Status: Normal   Collection Time   07/25/11  6:31 PM      Component Value Range Status Comment   Specimen Description URINE, CATHETERIZED   Final    Special Requests NONE   Final    Setup Time [id number]   Final    Colony Count NO GROWTH   Final    Culture NO GROWTH   Final    Report Status 07/27/2011 FINAL   Final   CSF CULTURE     Status: Normal   Collection Time    07/25/11 11:59 PM      Component Value Range Status Comment   Specimen Description CSF   Final    Special Requests NONE   Final    Gram Stain     Final    Value: CYTOSPIN WBC PRESENT,BOTH PMN AND MONONUCLEAR     NO ORGANISMS SEEN     Gram Stain Report Called to,Read Back By and Verified With: Gram Stain Report Called to,Read Back By and Verified With: DR CAMPBELL @1016  07/26/11 BY KRAWS   Culture     Final    Value: RARE LISTERIA MONOCYTOGENES     Note: Standardized susceptibility testing for this organism is not available. CRITICAL RESULT CALLED TO, READ BACK BY AND VERIFIED WITH: DR CAMPBELL 07/28/11 1500 BY SMITHERSJ RESULTS FAXED TO BETTY ROGERS @ [facility] 07/29/11 LYONK   Report Status 08/04/2011 FINAL   Final   PATHOLOGIST SMEAR REVIEW     Status: Normal   Collection Time   07/25/11 11:59 PM      Component Value Range Status Comment   Tech Review Reviewed by H. Hollice Espy, M.D.   Final   CLOSTRIDIUM DIFFICILE BY PCR     Status: Normal   Collection Time   07/28/11  7:47 PM      Component Value Range Status Comment   C difficile by pcr NEGATIVE  NEGATIVE  Final     Studies/Results: Ct Head Wo Contrast  07/25/2011  *RADIOLOGY REPORT*  Clinical Data: Altered mental status, multiple falls  CT HEAD WITHOUT CONTRAST  Technique:  Contiguous axial images were obtained from the base of the skull through the vertex without contrast.  Comparison: [facility] MRI brain dated 12/27/2005  Findings: Motion degraded images.  No evidence of parenchymal hemorrhage or extra-axial fluid collection. No mass lesion, mass effect, or midline shift.  No CT evidence of acute infarction.  Subcortical white matter and periventricular small vessel ischemic changes.  Age related atrophy. No ventriculomegaly.  The visualized paranasal sinuses are essentially clear. The mastoid air cells are unopacified.  No evidence of calvarial fracture.  IMPRESSION: Motion degraded images.  No evidence of acute intracranial  abnormality.  Atrophy with small vessel ischemic changes.  Original Report Authenticated By: Charline Bills, M.D.   Dg Chest Portable 1 View  07/25/2011  *RADIOLOGY REPORT*  Clinical Data: Fever.  Altered mental status.  PORTABLE CHEST - 1 VIEW  Comparison: 09/21/2006  Findings: Changes of COPD are again noted.  No evidence of acute infiltrate or pleural effusion.  Heart size is normal.  IMPRESSION: COPD.  No acute findings.  Original Report Authenticated By: Danae Orleans, M.D.    Medications: I have reviewed the patient's  current medications. Scheduled Meds:    . ampicillin (OMNIPEN) IV  2 g Intravenous Q4H  . aspirin EC  81 mg Oral Daily  . enoxaparin  40 mg Subcutaneous Q24H  . feeding supplement  237 mL Oral BID BM  . feeding supplement  30 mL Oral BID  . hydrochlorothiazide  12.5 mg Oral Daily  . olmesartan  40 mg Oral Daily  . pantoprazole  40 mg Oral Q breakfast  . potassium chloride  40 mEq Oral Once   Continuous Infusions:    . 0.9 % NaCl with KCl 20 mEq / L 75 mL/hr at 08/04/11 0952   PRN Meds:.acetaminophen, acetaminophen, albuterol, alum & mag hydroxide-simeth, HYDROcodone-acetaminophen, LORazepam, morphine, ondansetron (ZOFRAN) IV, ondansetron, sodium chloride Assessment/Plan: Patient Active Hospital Problem List: Syncope and collapse (07/25/2011)   the patient had a complaint of syncope prior to admission. Orthostatics reported as negative by nursing however we'll ask to repeat to document the orthostatics.    COPD (chronic obstructive pulmonary disease) (07/25/2011)   Assessment: stable   CAD (coronary artery disease) (07/25/2011)   Assessment: Pt S/P NSTEMI.    Plan: 2D-Echo results reviewed. Continue Benicar.  Elevated troponin (07/25/2011)   Assessment: NSTEMI   Plan: medical management.  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 Blair Dolphin recommendations  Hypertension   Assessment: The patient's blood pressure is  now starting to become elevated. I added hydrochlorothiazide to his regimen of medications and we'll continue to monitor them.  Post discharge disposition is SNF for rehab.  LOS: 10 days

## 2011-08-04 NOTE — Progress Notes (Addendum)
Physical Therapy Treatment Patient Details Name: Alec Walker MRN: [id number] DOB: 05-Apr-1927 Today's Date: 08/04/2011 Time: 1355-1415  1G PT Assessment/Plan  PT - Assessment/Plan Comments on Treatment Session: Pt agreed to ambulation with PT if he could get back in bed afterwards. Gait is moderately unsteady with pt demonstrating risk for falls. Continue to recommend SNF for continued rehab.  PT Plan: Discharge plan remains appropriate Follow Up Recommendations: Skilled nursing facility Equipment Recommended: Defer to next venue PT Goals  Acute Rehab PT Goals PT Goal: Supine/Side to Sit - Progress: Progressing toward goal PT Goal: Sit to Supine/Side - Progress: Progressing toward goal PT Goal: Sit to Stand - Progress: Progressing toward goal PT Goal: Stand to Sit - Progress: Progressing toward goal PT Goal: Ambulate - Progress: Progressing toward goal  PT Treatment Precautions/Restrictions  Precautions Precautions: Fall Restrictions Weight Bearing Restrictions: No Mobility (including Balance) Bed Mobility Bed Mobility: Yes Left Sidelying to Sit: 4: Min assist;HOB elevated (comment degrees);With rails Left Sidelying to Sit Details (indicate cue type and reason): VCs safety, technique, hand placmenet. Increased time.  Sit to Supine - Left: 4: Min assist Sit to Supine - Left Details (indicate cue type and reason): Assist for final positioning/scooting to [facility]. Increased time. VCs technique.  Transfers Transfers: Yes Sit to Stand: 4: Min assist;From bed;From elevated surface;With upper extremity assist Sit to Stand Details (indicate cue type and reason): VCs safety, technique, hand placement. Assist to rise, stabilize.  Stand to Sit: 4: Min assist;With upper extremity assist;To bed Stand to Sit Details: VCs safety, technique, hand placement. Assist to control descent.  Ambulation/Gait Ambulation/Gait: Yes Ambulation/Gait Assistance: 4: Min assist Ambulation/Gait Assistance  Details (indicate cue type and reason): VCs safety, technique. Gait unsteady. Assist to negotiate with RW and for stability. Retropulsion noted with turning/change in direction-assist to prevent LOB.  Ambulation Distance (Feet): 130 Feet Assistive device: Rolling walker Gait Pattern: Step-through pattern  Posture/Postural Control Posture/Postural Control: No significant limitations Exercise    End of Session PT - End of Session Equipment Utilized During Treatment: Gait belt Activity Tolerance: Patient tolerated treatment well Patient left: in bed;with call bell in reach General Behavior During Session: [facility] for tasks performed Cognition: [facility] for tasks performed  Rebeca Alert [facility] 08/04/2011, 2:31 PM [phone]

## 2011-08-04 NOTE — Progress Notes (Signed)
Patient ID: Alec Walker, male   DOB: 08-06-1927, 77 y.o.   MRN: [id number]  INFECTIOUS DISEASE PROGRESS NOTE    Date of Admission:  07/25/2011           Day 11 ampicillin         Principal Problem:  *Syncope and collapse Active Problems:  Listeria meningitis  SIRS (systemic inflammatory response syndrome)  COPD (chronic obstructive pulmonary disease)  CAD (coronary artery disease)  Elevated troponin      . ampicillin (OMNIPEN) IV  2 g Intravenous Q4H  . aspirin EC  81 mg Oral Daily  . enoxaparin  40 mg Subcutaneous Q24H  . feeding supplement  237 mL Oral BID BM  . feeding supplement  30 mL Oral BID  . olmesartan  40 mg Oral Daily  . pantoprazole  40 mg Oral Q breakfast  . potassium chloride  40 mEq Oral Once    Subjective: "They say I have spinal meningitis". He states his appetite is improving and he is feeling better.  Objective: Temp:  [97.9 F (36.6 C)-99 F (37.2 C)] 99 F (37.2 C) (12/17 1345) Pulse Rate:  [73-88] 88  (12/17 1345) Resp:  [18-20] 19  (12/17 1345) BP: (152-180)/(72-81) 170/81 mmHg (12/17 1345) SpO2:  [95 %-97 %] 97 % (12/17 1345)  General: He was asleep upon my entering the room but arouses easily and is appropriate with conversation Skin: No rash Lungs: Clear Cor: Regular S1 and S2 no murmurs Abdomen: Soft and nontender He is oriented to place and time  Lab Results Lab Results  Component Value Date   WBC 8.7 08/04/2011   HGB 10.0* 08/04/2011   HCT 29.9* 08/04/2011   MCV 94.0 08/04/2011   PLT 213 08/04/2011    Lab Results  Component Value Date   CREATININE 1.00 08/03/2011   BUN 17 08/03/2011   NA 133* 08/03/2011   K 3.8 08/03/2011   CL 103 08/03/2011   CO2 23 08/03/2011    Lab Results  Component Value Date   ALT 22 07/25/2011   AST 80* 07/25/2011   ALKPHOS 54 07/25/2011   BILITOT 0.4 07/25/2011       Microbiology: Recent Results (from the past 240 hour(s))  CULTURE, BLOOD (ROUTINE X 2)     Status: Normal   Collection  Time   07/25/11  6:27 PM      Component Value Range Status Comment   Specimen Description BLOOD LEFT ARM   Final    Special Requests BOTTLES DRAWN AEROBIC AND ANAEROBIC  UNKNOWN   Final    Setup Time [id number]   Final    Culture NO GROWTH 5 DAYS   Final    Report Status 07/31/2011 FINAL   Final   CULTURE, BLOOD (ROUTINE X 2)     Status: Normal   Collection Time   07/25/11  6:30 PM      Component Value Range Status Comment   Specimen Description BLOOD RIGHT HAND   Final    Special Requests BOTTLES DRAWN AEROBIC ONLY    Final    Setup Time [id number]   Final    Culture NO GROWTH 5 DAYS   Final    Report Status 07/31/2011 FINAL   Final   URINE CULTURE     Status: Normal   Collection Time   07/25/11  6:31 PM      Component Value Range Status Comment   Specimen Description URINE, CATHETERIZED   Final    Special  Requests NONE   Final    Setup Time [id number]   Final    Colony Count NO GROWTH   Final    Culture NO GROWTH   Final    Report Status 07/27/2011 FINAL   Final   CSF CULTURE     Status: Normal   Collection Time   07/25/11 11:59 PM      Component Value Range Status Comment   Specimen Description CSF   Final    Special Requests NONE   Final    Gram Stain     Final    Value: CYTOSPIN WBC PRESENT,BOTH PMN AND MONONUCLEAR     NO ORGANISMS SEEN     Gram Stain Report Called to,Read Back By and Verified With: Gram Stain Report Called to,Read Back By and Verified With: DR Ronni Osterberg @1016  07/26/11 BY KRAWS   Culture     Final    Value: RARE LISTERIA MONOCYTOGENES     Note: Standardized susceptibility testing for this organism is not available. CRITICAL RESULT CALLED TO, READ BACK BY AND VERIFIED WITH: DR Johannah Rozas 07/28/11 1500 BY SMITHERSJ RESULTS FAXED TO BETTY ROGERS @ [facility] 07/29/11 LYONK   Report Status 08/04/2011 FINAL   Final   PATHOLOGIST SMEAR REVIEW     Status: Normal   Collection Time   07/25/11 11:59 PM      Component Value Range Status Comment   Tech Review  Reviewed by H. Hollice Espy, M.D.   Final   CLOSTRIDIUM DIFFICILE BY PCR     Status: Normal   Collection Time   07/28/11  7:47 PM      Component Value Range Status Comment   C difficile by pcr NEGATIVE  NEGATIVE  Final     Studies/Results: No results found.   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  Plan: 1. Continue ampicillin.   [name], MD [facility] [phone] pager   [phone] cell 08/04/2011, 2:48 PM

## 2011-08-05 LAB — BASIC METABOLIC PANEL
BUN: 11 mg/dL (ref 6–23)
CO2: 22 mEq/L (ref 19–32)
Chloride: 102 mEq/L (ref 96–112)
Creatinine, Ser: 0.96 mg/dL (ref 0.50–1.35)
GFR calc Af Amer: 88 mL/min — ABNORMAL LOW (ref >90)
Potassium: 3.7 mEq/L (ref 3.5–5.1)

## 2011-08-05 LAB — URINE CULTURE

## 2011-08-05 MED ORDER — PANTOPRAZOLE SODIUM 40 MG PO TBEC: 40.0000 mg | DELAYED_RELEASE_TABLET | Freq: Every day | ORAL | Status: DC

## 2011-08-05 MED ORDER — ENSURE CLINICAL ST REVIGOR PO LIQD: 237.0000 mL | Freq: Two times a day (BID) | ORAL | Status: DC

## 2011-08-05 MED ORDER — PRO-STAT SUGAR FREE PO LIQD: 30.0000 mL | Freq: Two times a day (BID) | ORAL | Status: DC

## 2011-08-05 MED ORDER — HYDROCHLOROTHIAZIDE 12.5 MG PO CAPS: 12.5000 mg | ORAL_CAPSULE | Freq: Every day | ORAL | Status: DC

## 2011-08-05 MED ORDER — SODIUM CHLORIDE 0.9 % IV SOLN: 2.0000 g | Freq: Four times a day (QID) | INTRAVENOUS | Status: DC

## 2011-08-05 NOTE — Progress Notes (Signed)
Nutrition Follow-up  Diet Order:  Heart Healthy Pt is taking Prostat BID per RN.  Meds: Scheduled Meds:   . ampicillin (OMNIPEN) IV  2 g Intravenous Q4H  . aspirin EC  81 mg Oral Daily  . enoxaparin  40 mg Subcutaneous Q24H  . feeding supplement  237 mL Oral BID BM  . feeding supplement  30 mL Oral BID  . hydrochlorothiazide  12.5 mg Oral Daily  . olmesartan  40 mg Oral Daily  . pantoprazole  40 mg Oral Q breakfast  . potassium chloride  40 mEq Oral Once   Continuous Infusions:   . 0.9 % NaCl with KCl 20 mEq / L 75 mL/hr at 08/04/11 0952   PRN Meds:.acetaminophen, acetaminophen, albuterol, alum & mag hydroxide-simeth, HYDROcodone-acetaminophen, LORazepam, morphine, ondansetron (ZOFRAN) IV, ondansetron, sodium chloride  Labs:  CMP     Component Value Date/Time   NA 134* 08/05/2011 0445   K 3.7 08/05/2011 0445   CL 102 08/05/2011 0445   CO2 22 08/05/2011 0445   GLUCOSE 77 08/05/2011 0445   BUN 11 08/05/2011 0445   CREATININE 0.96 08/05/2011 0445   CALCIUM 8.1* 08/05/2011 0445   PROT 7.1 07/25/2011 1833   ALBUMIN 3.3* 07/25/2011 1833   AST 80* 07/25/2011 1833   ALT 22 07/25/2011 1833   ALKPHOS 54 07/25/2011 1833   BILITOT 0.4 07/25/2011 1833   GFRNONAA 76* 08/05/2011 0445   GFRAA 88* 08/05/2011 0445     Intake/Output Summary (Last 24 hours) at 08/05/11 1346 Last data filed at 08/05/11 1300  Gross per 24 hour  Intake    220 ml  Output   2850 ml  Net  -2630 ml    Weight Status:  Last known wt 119 lbs, no new wt obtained.   Nutrition Dx:  Inadequate oral intake, ongoing  Intervention:   1. Modify diet; pt not eating well.  Refused lunch today- states he had a late breakfast, however, only consumed 10% of breakfast tray this am. Pt agrees to order a snack with RD, order placed, to be sent from kitchen. Pt may benefit from 'Order with Assist' and part of the problem may be that he is not choosing his preferred meals. RD to change diet order.  Pt also benefit from  liberalizing diet to Regular given that intake remains 0-10% of meals.  Monitor:   1.  Food/Beverage; improvement to >25% of meals.  Possible liberalization of diet.   Hoyt Koch Pager #:  [phone]

## 2011-08-05 NOTE — Progress Notes (Signed)
Patient ID: Alec Walker, male   DOB: 12/29/1926, 77 y.o.   MRN: [id number]  INFECTIOUS DISEASE PROGRESS NOTE    Date of Admission:  07/25/2011           Day 12 ampicillin         Principal Problem:  *Syncope and collapse Active Problems:  Listeria meningitis  SIRS (systemic inflammatory response syndrome)  COPD (chronic obstructive pulmonary disease)  CAD (coronary artery disease)  Elevated troponin      . ampicillin (OMNIPEN) IV  2 g Intravenous Q4H  . aspirin EC  81 mg Oral Daily  . enoxaparin  40 mg Subcutaneous Q24H  . feeding supplement  237 mL Oral BID BM  . feeding supplement  30 mL Oral BID  . hydrochlorothiazide  12.5 mg Oral Daily  . olmesartan  40 mg Oral Daily  . pantoprazole  40 mg Oral Q breakfast  . potassium chloride  40 mEq Oral Once    Subjective: He denies headache. His appetite is poor but improving. He says he has not been out of bed today.  Objective: Temp:  [98.1 F (36.7 C)-99.2 F (37.3 C)] 98.5 F (36.9 C) (12/18 1312) Pulse Rate:  [73-81] 75  (12/18 1312) Resp:  [18-22] 20  (12/18 1312) BP: (147-173)/(68-81) 154/72 mmHg (12/18 1312) SpO2:  [92 %-98 %] 98 % (12/18 1312)  General: Alert in no distress Skin: All abrasions and bruises are healing nicely. Lungs: Clear Cor: Regular S1 and S2 no murmurs Abdomen: Soft and nontender. He says that he is in [facility]. He says the date is 07/29/2001.  Lab Results Lab Results  Component Value Date   WBC 8.7 08/04/2011   HGB 10.0* 08/04/2011   HCT 29.9* 08/04/2011   MCV 94.0 08/04/2011   PLT 213 08/04/2011    Lab Results  Component Value Date   CREATININE 0.96 08/05/2011   BUN 11 08/05/2011   NA 134* 08/05/2011   K 3.7 08/05/2011   CL 102 08/05/2011   CO2 22 08/05/2011    Lab Results  Component Value Date   ALT 22 07/25/2011   AST 80* 07/25/2011   ALKPHOS 54 07/25/2011   BILITOT 0.4 07/25/2011       Microbiology: Recent Results (from the past 240 hour(s))    CLOSTRIDIUM DIFFICILE BY PCR     Status: Normal   Collection Time   07/28/11  7:47 PM      Component Value Range Status Comment   C difficile by pcr NEGATIVE  NEGATIVE  Final   URINE CULTURE     Status: Normal   Collection Time   08/03/11  4:20 PM      Component Value Range Status Comment   Specimen Description URINE, CATHETERIZED   Final    Special Requests NONE   Final    Setup Time [id number]   Final    Colony Count NO GROWTH   Final    Culture NO GROWTH   Final    Report Status 08/05/2011 FINAL   Final     Studies/Results: No results found.   Assessment: He remained slightly disoriented but overall he is improving slowly. He has lost a great deal of weight over the last year and is debilitated. Because he is relatively immunosuppressed I would like to continue his IV ampicillin for his Listeria meningitis for a total of 3-4 weeks.  Plan: 1. Continue ampicillin. 2. I will be out of town tomorrow. I will followup December 20.  [name], MD [facility] Infectious Diseases [facility] [phone] pager   [phone] cell 08/05/2011, 4:06 PM

## 2011-08-05 NOTE — Progress Notes (Addendum)
Late entry for last week, 08/01/11: CSW spoke with patient's daughter and she requested CSW fax patient and get bed offers. FL-2 completed and faxed to facilities. 08/05/11: CSW called patients daughter today and gave bed offers. She will pick facility and call CSW back.  Shalin Vonbargen C. Sayaka Hoeppner MSW, LCSW [phone] Family requesting [facility]. [facility] bed at [facility] today. Patient can be discharged there tomorrow. Kennady Zimmerle C. Clive Parcel MSW, LCSW [phone]

## 2011-08-05 NOTE — Discharge Summary (Signed)
Alec Walker MRN: [id number] DOB/AGE: 10-25-1926 77 y.o.  Admit date: 07/25/2011 Discharge date: 08/05/2011  Primary Care Physician:  Alec Haste, MD   Discharge Diagnoses:   Patient Active Problem List  Diagnoses  . SIRS (systemic inflammatory response syndrome)  . COPD (chronic obstructive pulmonary disease)  . CAD (coronary artery disease)  . Elevated troponin  . Syncope and collapse  . Listeria meningitis    DISCHARGE MEDICATION: Current Discharge Medication List    CONTINUE these medications which have NOT CHANGED   Details  albuterol-ipratropium (COMBIVENT) 18-103 MCG/ACT inhaler Inhale 2 puffs into the lungs every 6 (six) hours as needed.      aspirin 81 MG tablet Take 81 mg by mouth daily.      telmisartan (MICARDIS) 80 MG tablet Take 80 mg by mouth daily.      temazepam (RESTORIL) 30 MG capsule Take 30 mg by mouth at bedtime as needed.      tiotropium (SPIRIVA) 18 MCG inhalation capsule Place 18 mcg into inhaler and inhale daily.             Consults:   Infectious Disease Cardiology   SIGNIFICANT DIAGNOSTIC STUDIES:  Ct Head Wo Contrast  07/25/2011  *RADIOLOGY REPORT*  Clinical Data: Altered mental status, multiple falls  CT HEAD WITHOUT CONTRAST  Technique:  Contiguous axial images were obtained from the base of the skull through the vertex without contrast.  Comparison: [facility] MRI brain dated 12/27/2005  Findings: Motion degraded images.  No evidence of parenchymal hemorrhage or extra-axial fluid collection. No mass lesion, mass effect, or midline shift.  No CT evidence of acute infarction.  Subcortical white matter and periventricular small vessel ischemic changes.  Age related atrophy. No ventriculomegaly.  The visualized paranasal sinuses are essentially clear. The mastoid air cells are unopacified.  No evidence of calvarial fracture.  IMPRESSION: Motion degraded images.  No evidence of acute intracranial abnormality.  Atrophy with small  vessel ischemic changes.  Original Report Authenticated By: Charline Bills, M.D.   Dg Chest Portable 1 View  07/25/2011  *RADIOLOGY REPORT*  Clinical Data: Fever.  Altered mental status.  PORTABLE CHEST - 1 VIEW  Comparison: 09/21/2006  Findings: Changes of COPD are again noted.  No evidence of acute infiltrate or pleural effusion.  Heart size is normal.  IMPRESSION: COPD.  No acute findings.  Original Report Authenticated By: Danae Orleans, M.D.     ECHO:   Poor quality images. EF likely normal to low normal The cavity size was normal. Wall thickness was increased in a pattern of mild LVH.    CARDIAC CATH & OTHER PROCEDURES: PICC line placement  Recent Results (from the past 240 hour(s))  CLOSTRIDIUM DIFFICILE BY PCR     Status: Normal   Collection Time   07/28/11  7:47 PM      Component Value Range Status Comment   C difficile by pcr NEGATIVE  NEGATIVE  Final   URINE CULTURE     Status: Normal   Collection Time   08/03/11  4:20 PM      Component Value Range Status Comment   Specimen Description URINE, CATHETERIZED   Final    Special Requests NONE   Final    Setup Time [id number]   Final    Colony Count NO GROWTH   Final    Culture NO GROWTH   Final    Report Status 08/05/2011 FINAL   Final     BRIEF ADMITTING H & P: This  77 year old gentleman, who has been in bed for the past 3 days with nonspecific illness, today he got up went to the bathroom and passed out. His wife was present, no report of seizures. He laid down for for a while, finally got up to go to the bedroom and they to pass out again. Both times he had glasses in his hands, they broke he sustained bodily abrasions. The patient at that time did not want 911 known to be called. By the time his daughter came home at 4PM, she found her father confused and talking gibberish. He complained of freezing chills and was very agitated and confused. En route to the hospital the patient tried climbing out of the car window. In  the ER the patient's temperature was as high as 104.2 with no clear source. His temperature has improved, he is more oriented but remains weak and spent.  The daughter who provides most of the history reports that patient has had a 40 pound weight loss over the past year and anorexia. His last colonoscopy was in 2005. He is also been getting progressively weaker. Last week he did have a cough, which seemed to resolve.  The patient does states he had chest pains and states it is not reproducible. Most history obtained from daughter Alec Walker, patient able to fit in some gaps.    Hospital Course:  Present on Admission:  .SIRS (systemic inflammatory response syndrome): Patient presented in a state of systemic inflammatory response syndrome. He was admitted to the hospital a starter Y. spectrum antibiotics. A workup led to a diagnosis of listeria meningitis. Infectious diseases was consult and they recommended ampicillin for 4 weeks. The patient is presently on day #12 of ampicillin and should continue for a total of 28 days.  . Listeria meningitis: See above the patient was diagnosed with posterior meningitis has been treated with ampicillin for a total of 12/28 days.  . Encephalopathy: Secondary to Listeria meningitis see above   . Deconditioning: The patient was seen by physical therapy and is decussation state secondary to his acute illness. I recommend that a skilled nursing facility for short-term rehabilitation.   .Elevated troponin/NSTEMI: As part of his initial workup the patient had cardiac markers drawn. Very few elevated CK and elevated up of 3.65 with no significant ST-T wave changes. Cardiology was consult and a suspicion was that the patient had a small non-ST elevated MI due to profound metabolic stress. He recommended only a medical management. 2-D echocardiogram which done which showed normal LV function.    Disposition and Follow up: The patient is being discharged to a skilled nursing  facility for short-term rehabilitation. He is awaiting approval from his insurance company for placement to short-term rehabilitation. Dietary restrictions none. Physical restrictions per guidance of physical therapy    DISCHARGE EXAMINATION: General: Alert, awake, oriented x3, in no acute distress.  HEENT: [place]/AT PEERL, EOMI  Blood pressure 154/72, pulse 75, temperature 98.5 F (36.9 C), temperature source Oral, resp. rate 20, height 6' (1.829 m), weight 54 kg (119 lb 0.8 oz), SpO2 98.00%. Neck: Trachea midline, no masses, no thyromegal,y no JVD, no carotid bruit  OROPHARYNX: Moist, No exudate/ erythema/lesions.  Heart: Regular rate and rhythm, without murmurs, rubs, gallops, PMI non-displaced, no heaves or thrills on palpation.  Lungs:Transmitted upper airway sounds. Rhonchi noted in BLL. No increased vocal fremitus resonant to percussion  Abdomen: Soft, nontender, nondistended, positive bowel sounds, no masses no hepatosplenomegaly noted..  Neuro: No focal neurological deficits noted cranial  nerves II through XII grossly intact. DTRs 2+ bilaterally upper and lower extremities. Strength 3+/5 in bilateral upper and lower extremities.  Musculoskeletal: No warm swelling or erythema around joints, no spinal tenderness noted.  Psychiatric: Patient alert and oriented x3, good insight and cognition, good recent to remote recall.  Lymph node survey: No cervical axillary or inguinal lymphadenopathy noted.    Basename 08/05/11 0445 08/03/11 1000  NA 134* 133*  K 3.7 3.8  CL 102 103  CO2 22 23  GLUCOSE 77 115*  BUN 11 17  CREATININE 0.96 1.00  CALCIUM 8.1* 8.1*  MG -- --  PHOS -- --   No results found for this basename: AST:2,ALT:2,ALKPHOS:2,BILITOT:2,PROT:2,ALBUMIN:2 in the last 72 hours No results found for this basename: LIPASE:2,AMYLASE:2 in the last 72 hours  Basename 08/04/11 0419 08/03/11 1000  WBC 8.7 12.2*  NEUTROABS 6.9 10.1*  HGB 10.0* 11.4*  HCT 29.9* 33.8*  MCV 94.0 94.9   PLT 213 234   Total time for discharge process including face-to-face time approximately 45 minutes.  Signed: Raynelle Fujikawa A. 08/05/2011, 6:20 PM

## 2011-08-06 MED ORDER — ATORVASTATIN CALCIUM 10 MG PO TABS: 10.0000 mg | ORAL_TABLET | Freq: Every day | ORAL | Status: DC

## 2011-08-06 MED ORDER — METOPROLOL TARTRATE 25 MG PO TABS
25.0000 mg | ORAL_TABLET | Freq: Two times a day (BID) | ORAL | Status: DC
  Administered 2011-08-06: 25 mg via ORAL
  Filled 2011-08-06 (×4): qty 1

## 2011-08-06 MED ORDER — OLMESARTAN MEDOXOMIL 20 MG PO TABS: 20.0000 mg | ORAL_TABLET | Freq: Every day | ORAL | Status: DC

## 2011-08-06 MED ORDER — METOPROLOL TARTRATE 25 MG PO TABS: 25.0000 mg | ORAL_TABLET | Freq: Two times a day (BID) | ORAL | Status: DC

## 2011-08-06 MED ORDER — HEPARIN SOD (PORK) LOCK FLUSH 100 UNIT/ML IV SOLN
250.0000 [IU] | INTRAVENOUS | Status: AC | PRN
  Administered 2011-08-06: 500 [IU]

## 2011-08-06 MED ORDER — OLMESARTAN MEDOXOMIL 20 MG PO TABS
20.0000 mg | ORAL_TABLET | Freq: Every day | ORAL | Status: DC
  Filled 2011-08-06: qty 1

## 2011-08-06 NOTE — Discharge Summary (Signed)
This is a addendum addition to DC summary by Dr Jerolyn Center.  See her note for Details, Meds adjusted as below    Patient Needs to be on Omnipen 2gms IV every 4 hours for 2 more weeks and need to see ID Dr Garald Balding in 1 week.  Medication List  As of 08/06/2011  2:12 PM   START taking these medications         atorvastatin 10 MG tablet   Commonly known as: LIPITOR   Take 1 tablet (10 mg total) by mouth daily.      feeding supplement Liqd   Take 237 mLs by mouth 2 (two) times daily between meals.      feeding supplement Liqd   Take 30 mLs by mouth 2 (two) times daily.      metoprolol tartrate 25 MG tablet   Commonly known as: LOPRESSOR   Take 1 tablet (25 mg total) by mouth 2 (two) times daily.      olmesartan 20 MG tablet   Commonly known as: BENICAR   Take 1 tablet (20 mg total) by mouth daily.      pantoprazole 40 MG tablet   Commonly known as: PROTONIX   Take 1 tablet (40 mg total) by mouth daily with breakfast.      sodium chloride 0.9 % SOLN 50 mL with ampicillin 2 G SOLR 2 g   Inject 2 g into the vein every 6 (six) hours.         CONTINUE taking these medications         albuterol-ipratropium 18-103 MCG/ACT inhaler   Commonly known as: COMBIVENT      aspirin 81 MG tablet      temazepam 30 MG capsule   Commonly known as: RESTORIL      tiotropium 18 MCG inhalation capsule   Commonly known as: SPIRIVA         STOP taking these medications         telmisartan 80 MG tablet          Where to get your medications    These are the prescriptions that you need to pick up.   You may get these medications from any pharmacy.         atorvastatin 10 MG tablet   metoprolol tartrate 25 MG tablet   olmesartan 20 MG tablet         Information on where to get these meds is not yet available. Ask your nurse or doctor.         feeding supplement Liqd   pantoprazole 40 MG tablet   sodium chloride 0.9 % SOLN 50 mL with ampicillin 2 G SOLR 2 g           Pt is  still mildly orthostatic with have taught him on fall precautions as below, activity with assistance and walker, redcuce ARB, low dose B blcoker, TED stockings + instructions as below.  You must sit at a stationary position for 5 minutes and do leg extension exercises as taught for 5 minutes before you start walking from a resting position or after getting up from a bed, once you stand up , stand at that spot for 3-5 minutes while holding on to a wall-bed-heavy furniture and then walk only if you are not dizzy, using a  walker at all times, if you still get dizzy sit down, and call for help.    NELTON AMSDEN, 77 y.o., DOB 10-Apr-1927, MRN [id number]. Admission date:  07/25/2011 Discharge Date 08/06/2011 Primary MD Eartha Inch, MD Admitting Physician No admitting provider for patient encounter.  Admission Diagnosis  Hypokalemia [276.8] Altered mental status [780.97] Cardiac enzymes elevated [790.5] Fever [780.60] ALTERED MENTAL STATUS  Discharge Diagnosis   Principal Problem:  *Syncope and collapse Active Problems:  SIRS (systemic inflammatory response syndrome)  COPD (chronic obstructive pulmonary disease)  CAD (coronary artery disease)  Elevated troponin  Listeria meningitis      Past Medical History  Diagnosis Date  . COPD (chronic obstructive pulmonary disease)   . CAD (coronary artery disease)   . HTN (hypertension)   . PUD (peptic ulcer disease)   . Syncope 07/25/11    x 2 today    Past Surgical History  Procedure Date  . Cataract extraction   . Gastrectomy   . Transurethral resection of prostate   . Coronary angioplasty with stent placement     stent in 2003   Hospital Course See H&P, Labs, Consult and Test reports for all details in brief, patient was admitted for  Listeria Meningitis and Orthostatic Syncope -SIRs , NSTEMI - was seen by ID and Cardiology, Echo stable, CSF grew Listeria, now on IV Ampicillin for 2 more weeks, has L arm PICC. Meds adjusted for  Orthostasis, Pt is still mildly orthostatic with have taught him on fall precautions as below, activity with assistance and walker, redcuce ARB, low dose B blcoker, TED stockings + instructions as above. On Asa-Statin and B blcoker, please consider outpt ID follow in 1 weeks and Cardio followup in 2 weeks, repeat CBC and BMP in 3-4 days.  Remove foley by the end of this week with some bladder training.DC PICC once patient seen by ID & IV antibiotics stopped.    Consults  ID, cardiology  Significant Tests:  See full reports for all details     Ct Head Wo Contrast  07/25/2011  *RADIOLOGY REPORT*  Clinical Data: Altered mental status, multiple falls  CT HEAD WITHOUT CONTRAST  Technique:  Contiguous axial images were obtained from the base of the skull through the vertex without contrast.  Comparison: [facility] MRI brain dated 12/27/2005  Findings: Motion degraded images.  No evidence of parenchymal hemorrhage or extra-axial fluid collection. No mass lesion, mass effect, or midline shift.  No CT evidence of acute infarction.  Subcortical white matter and periventricular small vessel ischemic changes.  Age related atrophy. No ventriculomegaly.  The visualized paranasal sinuses are essentially clear. The mastoid air cells are unopacified.  No evidence of calvarial fracture.  IMPRESSION: Motion degraded images.  No evidence of acute intracranial abnormality.  Atrophy with small vessel ischemic changes.  Original Report Authenticated By: Charline Bills, M.D.   Dg Chest Portable 1 View  07/25/2011  *RADIOLOGY REPORT*  Clinical Data: Fever.  Altered mental status.  PORTABLE CHEST - 1 VIEW  Comparison: 09/21/2006  Findings: Changes of COPD are again noted.  No evidence of acute infiltrate or pleural effusion.  Heart size is normal.  IMPRESSION: COPD.  No acute findings.  Original Report Authenticated By: Danae Orleans, M.D.    Today   Subjective:   Drewey Whisonant today has no headache,no chest  abdominal pain,no new weakness tingling or numbness, feels much better .  Objective:   Blood pressure 161/64, pulse 73, temperature 98.7 F (37.1 C), temperature source Oral, resp. rate 18, height 6' (1.829 m), weight 54 kg (119 lb 0.8 oz), SpO2 98.00%.  Intake/Output Summary (Last 24 hours) at 08/06/11 1408 Last data filed  at 08/06/11 0900  Gross per 24 hour  Intake   2675 ml  Output   1650 ml  Net   1025 ml    Exam Awake Alert, Oriented *3, No new F.N deficits, Normal affect [place].AT,PERRAL Supple Neck,No JVD, No cervical lymphadenopathy appriciated.  Symmetrical Chest wall movement, Good air movement bilaterally, CTAB RRR,No Gallops,Rubs or new Murmurs, No Parasternal Heave +ve B.Sounds, Abd Soft, Non tender, No organomegaly appriciated, No rebound -guarding or rigidity. No Cyanosis, Clubbing or edema, No new Rash or bruise  Data Review    CBC w Diff: Lab Results  Component Value Date   WBC 8.7 08/04/2011   HGB 10.0* 08/04/2011   HCT 29.9* 08/04/2011   PLT 213 08/04/2011   LYMPHOPCT 10* 08/04/2011   MONOPCT 7 08/04/2011   EOSPCT 4 08/04/2011   BASOPCT 0 08/04/2011   CMP: Lab Results  Component Value Date   NA 134* 08/05/2011   K 3.7 08/05/2011   CL 102 08/05/2011   CO2 22 08/05/2011   BUN 11 08/05/2011   CREATININE 0.96 08/05/2011   PROT 7.1 07/25/2011   ALBUMIN 3.3* 07/25/2011   BILITOT 0.4 07/25/2011   ALKPHOS 54 07/25/2011   AST 80* 07/25/2011   ALT 22 07/25/2011  . Discharge Instructions      Follow with Primary MD Eartha Inch, MD in 7 days   Get CBC, CMP, checked 7 days by Primary MD and again as instructed by your Primary MD. Get a 2 view Chest X ray done next visit.  Get Medicines reviewed and adjusted.  Please request your Prim.MD to go over all Hospital Tests and Procedure/Radiological results at the follow up, please get all Hospital records sent to your Prim MD by signing hospital release before you go home.  Activity: Fall precautions use  walker at all times.  Diet: Cardiac, Aspiration precautions.  For Heart failure patients - Check your Weight same time everyday, if you gain over 2 pounds, or you develop in leg swelling, experience more shortness of breath or chest pain, call your Primary MD immediately. Follow Cardiac Low Salt Diet and 1.8 lit/day fluid restriction.  Disposition Home  If you experience worsening of your admission symptoms, develop shortness of breath, life threatening emergency, suicidal or homicidal thoughts you must seek medical attention immediately by calling 911 or calling your MD immediately  if symptoms less severe.  You Must read complete instructions/literature along with all the possible adverse reactions/side effects for all the Medicines you take and that have been prescribed to you. Take any new Medicines after you have completely understood and accpet all the possible adverse reactions/side effects.   Do not drive if your were admitted for syncope or siezures until you have seen by Primary MD or a Neurologist and advised to drive.  Do not drive when taking Pain medications.    Do not take more than prescribed Pain, Sleep and Anxiety Medications  Special Instructions: If you have smoked or chewed Tobacco  in the last 2 yrs please stop smoking, stop any regular Alcohol  and or any Recreational drug use.  Wear Seat belts while driving. Follow-up Information    Follow up with BADGER,MICHAEL C. Make an appointment in 1 week.   Contact information:   [address] [place] [address] [phone]       Follow up with [name], MD. Make an appointment in 1 week.   Contact information:   [address] [place]  [address] [phone]            Total Time in preparing paper work, data evaluation and todays exam - 35 minutes  Leroy Sea M.D 08/06/2011, 2:08 PM  [facility] Office  [phone]

## 2011-08-06 NOTE — Progress Notes (Signed)
Physical Therapy Treatment Patient Details Name: Alec Walker MRN: [id number] DOB: 02/11/1927 Today's Date: 08/06/2011 Time: 4098-1191  1TA, 1G PT Assessment/Plan  PT - Assessment/Plan Comments on Treatment Session: Pt continuing to progress well with therapy - more motivated to participate this session. Continue to recommend SNF for rehab.  PT Plan: Discharge plan remains appropriate Follow Up Recommendations: Skilled nursing facility Equipment Recommended: Defer to next venue PT Goals  Acute Rehab PT Goals PT Goal: Supine/Side to Sit - Progress: Progressing toward goal PT Goal: Sit to Stand - Progress: Progressing toward goal PT Goal: Stand to Sit - Progress: Progressing toward goal PT Goal: Ambulate - Progress: Progressing toward goal  PT Treatment Precautions/Restrictions  Precautions Precautions: Fall Restrictions Weight Bearing Restrictions: No Mobility (including Balance) Bed Mobility Bed Mobility: Yes Left Sidelying to Sit: HOB elevated (comment degrees);With rails Left Sidelying to Sit Details (indicate cue type and reason): Min-guard assist.  Transfers Transfers: Yes Sit to Stand: 4: Min assist;From bed;With upper extremity assist;From toilet Sit to Stand Details (indicate cue type and reason): VCs safety, technique, hand placement. Assist to rise, stabilize.  Stand to Sit: To toilet;To chair/3-in-1;With upper extremity assist;With armrests;4: Min assist Stand to Sit Details: Vcs safety, technique, hand placement. Assist to control descent.  Ambulation/Gait Ambulation/Gait: Yes Ambulation/Gait Assistance: 4: Min assist Ambulation/Gait Assistance Details (indicate cue type and reason): VC safety/safe use of RW. Some improvement in gait, but still unsteady intermittently. Further ambulation deferred due to pt needing to rush to bathroom to toilet.  Ambulation Distance (Feet): 100 Feet Assistive device: Rolling walker Gait Pattern: Step-through pattern    Posture/Postural Control Posture/Postural Control: No significant limitations Exercise    End of Session PT - End of Session Equipment Utilized During Treatment: Gait belt Activity Tolerance: Patient tolerated treatment well Patient left: in chair;with call bell in reach General Behavior During Session: [facility] for tasks performed Cognition: [facility] for tasks performed  Rebeca Alert [facility] 08/06/2011, 11:42 AM [phone]

## 2011-08-06 NOTE — Discharge Instructions (Signed)
Follow with Primary MD Eartha Inch, MD in 7 days   Get CBC, CMP, checked 7 days by Primary MD and again as instructed by your Primary MD. Get a 2 view Chest X ray done next visit.  Get Medicines reviewed and adjusted.  Please request your Prim.MD to go over all Hospital Tests and Procedure/Radiological results at the follow up, please get all Hospital records sent to your Prim MD by signing hospital release before you go home.  Activity: Fall precautions use walker at all times.  Diet: Cardiac, Aspiration precautions.  For Heart failure patients - Check your Weight same time everyday, if you gain over 2 pounds, or you develop in leg swelling, experience more shortness of breath or chest pain, call your Primary MD immediately. Follow Cardiac Low Salt Diet and 1.8 lit/day fluid restriction.  Disposition Home  If you experience worsening of your admission symptoms, develop shortness of breath, life threatening emergency, suicidal or homicidal thoughts you must seek medical attention immediately by calling 911 or calling your MD immediately  if symptoms less severe.  You Must read complete instructions/literature along with all the possible adverse reactions/side effects for all the Medicines you take and that have been prescribed to you. Take any new Medicines after you have completely understood and accpet all the possible adverse reactions/side effects.   Do not drive if your were admitted for syncope or siezures until you have seen by Primary MD or a Neurologist and advised to drive.  Do not drive when taking Pain medications.    Do not take more than prescribed Pain, Sleep and Anxiety Medications  Special Instructions: If you have smoked or chewed Tobacco  in the last 2 yrs please stop smoking, stop any regular Alcohol  and or any Recreational drug use.  Wear Seat belts while driving.

## 2011-08-26 ENCOUNTER — Ambulatory Visit (INDEPENDENT_AMBULATORY_CARE_PROVIDER_SITE_OTHER): Payer: Medicare Other | Admitting: Internal Medicine

## 2011-08-26 ENCOUNTER — Encounter: Payer: Self-pay | Admitting: Internal Medicine

## 2011-08-26 VITALS — BP 179/84 | HR 77 | Temp 98.2°F | Ht 70.0 in | Wt 119.0 lb

## 2011-08-26 DIAGNOSIS — A3211 Listerial meningitis: Secondary | ICD-10-CM

## 2011-08-26 DIAGNOSIS — A329 Listeriosis, unspecified: Secondary | ICD-10-CM

## 2011-08-26 DIAGNOSIS — G01 Meningitis in bacterial diseases classified elsewhere: Secondary | ICD-10-CM

## 2011-09-01 NOTE — Progress Notes (Signed)
INFECTIOUS DISEASES CLINIC NOTE  RFV: hospital follow up for listeria meningitis Subjective:    Patient ID: Alec Walker, male    DOB: 08-02-1927, 78 y.o.   MRN: [id number]  HPI Alec Walker is an 78yo WM with history of COPD, CAD who was admitted to [facility] in Dec 7th, 2012 with meningo-encephalopathy. He was noted to have recurrent falls, fevers, chills, confusion that quickly turned to becoming obtunded. He underwent lumbar puncture which had a neutrophilic predominance pleocytosis of 641 WBC with 81% segs. Low glucose of 17 and elevated protein of 264. He was initially stared on vancomycin and pip/tazo. ID consultation recommended the initiation of dexamethasone for bacterial meningitis and changing his regimen to ampicillin and ceftriaxone. 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 He was discharged on Dec 19th to finish an additional 2 wks of ampicillin for a total of 4 wks. The patient was discharged to a SNF for further management. He finished his ampicillin on approximately on January 2nd and has been rehabilitating, participating in PT/OT at the SNF. He is able to recall events that led to his hospitalization, but is somewhat amnestic to what occurred at the hospital. He is looking forward to going home to join up with his wife however, he has been somewhat wheelchaired bound and now using 2L supplement O2 via [place] while at the SNF.  He denies fever, chills, nightsweats, headache. Slight shortness of breath with exertion.  Prior to Admission medications   Medication Sig Start Date End Date Taking? Authorizing Provider  albuterol-ipratropium (COMBIVENT) 18-103 MCG/ACT inhaler Inhale 2 puffs into the lungs every 6 (six) hours as needed.     Yes Historical Provider, MD  aspirin 81 MG tablet Take 81 mg by mouth daily.     Yes Historical Provider, MD  atorvastatin (LIPITOR) 10 MG tablet Take 1 tablet (10 mg total) by mouth daily. 08/06/11 08/05/12 Yes Leroy Sea,  MD  feeding supplement (ENSURE CLINICAL STRENGTH) LIQD Take 237 mLs by mouth 2 (two) times daily between meals. 08/05/11  Yes Michelle A. Matthews  feeding supplement (PRO-STAT SUGAR FREE 64) LIQD Take 30 mLs by mouth 2 (two) times daily. 08/05/11  Yes Michelle A. Matthews  metoprolol tartrate (LOPRESSOR) 25 MG tablet Take 1 tablet (25 mg total) by mouth 2 (two) times daily. 08/06/11 08/05/12 Yes Leroy Sea, MD  olmesartan (BENICAR) 20 MG tablet Take 1 tablet (20 mg total) by mouth daily. 08/06/11 08/05/12 Yes Leroy Sea, MD  pantoprazole (PROTONIX) 40 MG tablet Take 1 tablet (40 mg total) by mouth daily with breakfast. 08/05/11 08/04/12 Yes Michelle A. Matthews  sodium chloride 0.9 % SOLN 50 mL with ampicillin 2 G SOLR 2 g Inject 2 g into the vein every 6 (six) hours. 08/05/11  Yes Michelle A. Matthews  temazepam (RESTORIL) 30 MG capsule Take 30 mg by mouth at bedtime as needed.     Yes Historical Provider, MD  tiotropium (SPIRIVA) 18 MCG inhalation capsule Place 18 mcg into inhaler and inhale daily.     Yes Historical Provider, MD   Active Ambulatory Problems    Diagnosis Date Noted  . SIRS (systemic inflammatory response syndrome) 07/25/2011  . COPD (chronic obstructive pulmonary disease) 07/25/2011  . CAD (coronary artery disease) 07/25/2011  . Elevated troponin 07/25/2011  . Syncope and collapse 07/25/2011  . 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 No Resolved Ambulatory Problems   Past Medical History  Diagnosis Date  . HTN (hypertension)   . PUD (peptic ulcer disease)   . Syncope 07/25/11   History  Substance Use Topics  . Smoking status: Former Games developer  . Smokeless tobacco: Not on file  . Alcohol Use: No     hx etoh abuse in past, daughter states quit over 20 years ago  he is married, his wife suffered a stroke within the last year; he is retired, previously worked doing Furniture conservator/restorer. Quit smoking 3yrs ago but has  45PYH.  Family hx: indicated that his mother is deceased.  died of heart attack   Review of Systems  Constitutional: Negative for fever, chills, diaphoresis, activity change, appetite change, fatigue and unexpected weight change.  HENT: Negative for congestion, sore throat, rhinorrhea, sneezing, trouble swallowing and sinus pressure.  Eyes: Negative for photophobia and visual disturbance.  Respiratory: Negative for cough, chest tightness,mild shortness of breath with physical exertion, but denies wheezing and stridor.  Cardiovascular: Negative for chest pain, palpitations and leg swelling.  Gastrointestinal: Negative for nausea, vomiting, abdominal pain, diarrhea, constipation, blood in stool, abdominal distention and anal bleeding.  Genitourinary: Negative for dysuria, hematuria, flank pain and difficulty urinating.  Musculoskeletal: Negative for myalgias, back pain, joint swelling, arthralgias and gait problem.  Skin: Negative for color change, pallor, rash and wound.  Neurological: Negative for dizziness, tremors, weakness and light-headedness.  Hematological: Negative for adenopathy. Does not bruise/bleed easily.  Psychiatric/Behavioral: Negative for behavioral problems, confusion, sleep disturbance, dysphoric mood, decreased concentration and agitation.       Objective:   Physical Exam BP 179/84  Pulse 77  Temp(Src) 98.2 F (36.8 C) (Oral)  Ht 5\' 10"  (1.778 m)  Wt 119 lb (53.978 kg)  BMI 17.07 kg/m2  General Appearance:    Alert, cooperative, no distress, frail appearing, older than stated age, wearing supplemental oxygen  Head:    Normocephalic, without obvious abnormality, atraumatic  Eyes:    PERRL, conjunctiva/corneas clear, EOM's intact,   Ears:    Normal TM's and external ear canals, both ears  Nose:   Nares normal, septum midline, mucosa normal, no drainage   or sinus tenderness  Throat:   Lips, mucosa, and tongue normal; teeth and gums normal  Neck:   Supple,  symmetrical, trachea midline, no adenopathy;         Back:     Symmetric, no curvature, ROM normal, no CVA tenderness  Lungs:     Clear to auscultation bilaterally, respirations unlabored     Heart:    Regular rate and rhythm, S1 and S2 normal, no murmur, rub   or gallop  Abdomen:     Soft, non-tender, bowel sounds active all four quadrants,    no masses, no organomegaly; has midline surgical scar        Extremities:   Extremities are thin, atraumatic, no cyanosis or edema  Pulses:   2+ and symmetric all extremities  Skin:   Frail skin, occ echymosis, signs of solar keratosis; picc line c/d/i  Lymph nodes:   Cervical, supraclavicular, and axillary nodes normal          Assessment & Plan:  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 He appears to have recovered from this significant, life threatening infection. We will discontinue his picc line at this visit since he no longer needs IV antibiotics.  Will not need follow up appt, unless further issues arises

## 2011-09-09 ENCOUNTER — Emergency Department (HOSPITAL_COMMUNITY): Payer: Medicare Other

## 2011-09-09 ENCOUNTER — Inpatient Hospital Stay (HOSPITAL_COMMUNITY)
Admission: EM | Admit: 2011-09-09 | Discharge: 2011-09-11 | DRG: 195 | Disposition: A | Discharge status: Home Health | Payer: Medicare Other | Attending: Family Medicine | Admitting: Family Medicine

## 2011-09-09 ENCOUNTER — Encounter (HOSPITAL_COMMUNITY): Payer: Self-pay | Admitting: Emergency Medicine

## 2011-09-09 DIAGNOSIS — I2510 Atherosclerotic heart disease of native coronary artery without angina pectoris: Secondary | ICD-10-CM | POA: Diagnosis present

## 2011-09-09 DIAGNOSIS — E876 Hypokalemia: Secondary | ICD-10-CM | POA: Diagnosis present

## 2011-09-09 DIAGNOSIS — J189 Pneumonia, unspecified organism: Secondary | ICD-10-CM | POA: Diagnosis present

## 2011-09-09 DIAGNOSIS — R0902 Hypoxemia: Secondary | ICD-10-CM | POA: Diagnosis present

## 2011-09-09 DIAGNOSIS — D649 Anemia, unspecified: Secondary | ICD-10-CM | POA: Diagnosis present

## 2011-09-09 DIAGNOSIS — J4489 Other specified chronic obstructive pulmonary disease: Secondary | ICD-10-CM | POA: Diagnosis present

## 2011-09-09 DIAGNOSIS — Z9981 Dependence on supplemental oxygen: Secondary | ICD-10-CM

## 2011-09-09 DIAGNOSIS — J449 Chronic obstructive pulmonary disease, unspecified: Secondary | ICD-10-CM | POA: Diagnosis present

## 2011-09-09 DIAGNOSIS — Z66 Do not resuscitate: Secondary | ICD-10-CM | POA: Diagnosis present

## 2011-09-09 LAB — URINALYSIS, ROUTINE W REFLEX MICROSCOPIC
Glucose, UA: NEGATIVE mg/dL
Ketones, ur: NEGATIVE mg/dL
Leukocytes, UA: NEGATIVE
Nitrite: NEGATIVE
Protein, ur: NEGATIVE mg/dL
pH: 5.5 (ref 5.0–8.0)

## 2011-09-09 LAB — COMPREHENSIVE METABOLIC PANEL
Albumin: 3 g/dL — ABNORMAL LOW (ref 3.5–5.2)
Alkaline Phosphatase: 87 U/L (ref 39–117)
BUN: 11 mg/dL (ref 6–23)
Creatinine, Ser: 1.05 mg/dL (ref 0.50–1.35)
GFR calc Af Amer: 74 mL/min — ABNORMAL LOW (ref >90)
Glucose, Bld: 94 mg/dL (ref 70–99)
Potassium: 3.1 mEq/L — ABNORMAL LOW (ref 3.5–5.1)
Total Bilirubin: 0.4 mg/dL (ref 0.3–1.2)
Total Protein: 6.3 g/dL (ref 6.0–8.3)

## 2011-09-09 LAB — CBC
HCT: 33.7 % — ABNORMAL LOW (ref 39.0–52.0)
HCT: 31.6 % — ABNORMAL LOW (ref 39.0–52.0)
Hemoglobin: 11.3 g/dL — ABNORMAL LOW (ref 13.0–17.0)
Hemoglobin: 10.2 g/dL — ABNORMAL LOW (ref 13.0–17.0)
MCH: 32 pg (ref 26.0–34.0)
MCH: 30.7 pg (ref 26.0–34.0)
MCHC: 33.5 g/dL (ref 30.0–36.0)
MCV: 95.5 fL (ref 78.0–100.0)
RBC: 3.53 MIL/uL — ABNORMAL LOW (ref 4.22–5.81)
RBC: 3.32 MIL/uL — ABNORMAL LOW (ref 4.22–5.81)

## 2011-09-09 LAB — DIFFERENTIAL
Basophils Relative: 0 % (ref 0–1)
Eosinophils Absolute: 0.2 10*3/uL (ref 0.0–0.7)
Lymphs Abs: 0.8 10*3/uL (ref 0.7–4.0)
Monocytes Absolute: 0.7 10*3/uL (ref 0.1–1.0)
Monocytes Relative: 9 % (ref 3–12)

## 2011-09-09 LAB — INFLUENZA PANEL BY PCR (TYPE A & B)
H1N1 flu by pcr: NOT DETECTED
Influenza B By PCR: NEGATIVE

## 2011-09-09 LAB — CREATININE, SERUM
Creatinine, Ser: 1.07 mg/dL (ref 0.50–1.35)
GFR calc non Af Amer: 63 mL/min — ABNORMAL LOW (ref >90)

## 2011-09-09 LAB — URINE MICROSCOPIC-ADD ON

## 2011-09-09 MED ORDER — POLYVINYL ALCOHOL 1.4 % OP SOLN
2.0000 [drp] | Freq: Every day | OPHTHALMIC | Status: DC
  Administered 2011-09-09 – 2011-09-11 (×3): 2 [drp] via OPHTHALMIC
  Filled 2011-09-09: qty 15

## 2011-09-09 MED ORDER — HYPROMELLOSE (GONIOSCOPIC) 2.5 % OP SOLN: 2.0000 [drp] | Freq: Every day | OPHTHALMIC | Status: DC

## 2011-09-09 MED ORDER — SIMVASTATIN 20 MG PO TABS
20.0000 mg | ORAL_TABLET | Freq: Every day | ORAL | Status: DC
  Administered 2011-09-09 – 2011-09-10 (×2): 20 mg via ORAL
  Filled 2011-09-09 (×4): qty 1

## 2011-09-09 MED ORDER — PIPERACILLIN-TAZOBACTAM 3.375 G IVPB
3.3750 g | Freq: Once | INTRAVENOUS | Status: AC
  Administered 2011-09-09: 3.375 g via INTRAVENOUS
  Filled 2011-09-09: qty 50

## 2011-09-09 MED ORDER — VITAMIN D3 25 MCG (1000 UNIT) PO TABS
1000.0000 [IU] | ORAL_TABLET | Freq: Every day | ORAL | Status: DC
  Administered 2011-09-09 – 2011-09-11 (×3): 1000 [IU] via ORAL
  Filled 2011-09-09 (×3): qty 1

## 2011-09-09 MED ORDER — VANCOMYCIN HCL 500 MG IV SOLR
500.0000 mg | Freq: Two times a day (BID) | INTRAVENOUS | Status: DC
  Administered 2011-09-10: 500 mg via INTRAVENOUS
  Filled 2011-09-09 (×3): qty 500

## 2011-09-09 MED ORDER — OFLOXACIN 0.3 % OP SOLN
1.0000 [drp] | Freq: Four times a day (QID) | OPHTHALMIC | Status: DC
  Administered 2011-09-09 – 2011-09-10 (×5): 1 [drp] via OPHTHALMIC
  Administered 2011-09-11: 2 [drp] via OPHTHALMIC
  Filled 2011-09-09: qty 5

## 2011-09-09 MED ORDER — ENSURE CLINICAL ST REVIGOR PO LIQD
237.0000 mL | Freq: Two times a day (BID) | ORAL | Status: DC
  Administered 2011-09-10 – 2011-09-11 (×3): 237 mL via ORAL

## 2011-09-09 MED ORDER — NAPROXEN SODIUM 275 MG PO TABS: 440.0000 mg | ORAL_TABLET | Freq: Two times a day (BID) | ORAL | Status: DC

## 2011-09-09 MED ORDER — OLMESARTAN 10 MG HALF TABLET
10.0000 mg | ORAL_TABLET | Freq: Every day | ORAL | Status: DC
  Administered 2011-09-09 – 2011-09-11 (×3): 10 mg via ORAL
  Filled 2011-09-09 (×3): qty 1

## 2011-09-09 MED ORDER — PANTOPRAZOLE SODIUM 40 MG PO TBEC
40.0000 mg | DELAYED_RELEASE_TABLET | Freq: Every day | ORAL | Status: DC
  Administered 2011-09-10 – 2011-09-11 (×2): 40 mg via ORAL
  Filled 2011-09-09 (×2): qty 1

## 2011-09-09 MED ORDER — METOPROLOL TARTRATE 25 MG PO TABS
25.0000 mg | ORAL_TABLET | Freq: Two times a day (BID) | ORAL | Status: DC
  Administered 2011-09-09 – 2011-09-11 (×4): 25 mg via ORAL
  Filled 2011-09-09 (×5): qty 1

## 2011-09-09 MED ORDER — SODIUM CHLORIDE 0.9 % IV SOLN
Freq: Once | INTRAVENOUS | Status: AC
  Administered 2011-09-09: 12:00:00 via INTRAVENOUS

## 2011-09-09 MED ORDER — ASPIRIN 81 MG PO CHEW
81.0000 mg | CHEWABLE_TABLET | Freq: Every day | ORAL | Status: DC
  Administered 2011-09-09 – 2011-09-11 (×3): 81 mg via ORAL
  Filled 2011-09-09 (×3): qty 1

## 2011-09-09 MED ORDER — ZOLPIDEM TARTRATE 5 MG PO TABS
5.0000 mg | ORAL_TABLET | Freq: Every evening | ORAL | Status: DC | PRN
  Administered 2011-09-10: 5 mg via ORAL
  Filled 2011-09-09: qty 1

## 2011-09-09 MED ORDER — TIOTROPIUM BROMIDE MONOHYDRATE 18 MCG IN CAPS
18.0000 ug | ORAL_CAPSULE | Freq: Every day | RESPIRATORY_TRACT | Status: DC
  Administered 2011-09-10 – 2011-09-11 (×2): 18 ug via RESPIRATORY_TRACT
  Filled 2011-09-09: qty 5

## 2011-09-09 MED ORDER — ENOXAPARIN SODIUM 40 MG/0.4ML ~~LOC~~ SOLN
40.0000 mg | SUBCUTANEOUS | Status: DC
  Administered 2011-09-09 – 2011-09-10 (×2): 40 mg via SUBCUTANEOUS
  Filled 2011-09-09 (×3): qty 0.4

## 2011-09-09 MED ORDER — SODIUM CHLORIDE 0.9 % IV SOLN
Freq: Once | INTRAVENOUS | Status: AC
  Administered 2011-09-09: 18:00:00 via INTRAVENOUS

## 2011-09-09 MED ORDER — POTASSIUM CHLORIDE CRYS ER 20 MEQ PO TBCR
40.0000 meq | EXTENDED_RELEASE_TABLET | Freq: Once | ORAL | Status: AC
  Administered 2011-09-09: 40 meq via ORAL
  Filled 2011-09-09: qty 2

## 2011-09-09 MED ORDER — GUAIFENESIN ER 600 MG PO TB12
600.0000 mg | ORAL_TABLET | Freq: Two times a day (BID) | ORAL | Status: DC | PRN
  Filled 2011-09-09: qty 1

## 2011-09-09 MED ORDER — TEMAZEPAM 15 MG PO CAPS: 30.0000 mg | ORAL_CAPSULE | Freq: Every evening | ORAL | Status: DC | PRN

## 2011-09-09 MED ORDER — LEVOFLOXACIN IN D5W 500 MG/100ML IV SOLN
500.0000 mg | INTRAVENOUS | Status: DC
  Administered 2011-09-09 – 2011-09-10 (×2): 500 mg via INTRAVENOUS
  Filled 2011-09-09 (×3): qty 100

## 2011-09-09 MED ORDER — PIPERACILLIN-TAZOBACTAM 3.375 G IVPB
3.3750 g | INTRAVENOUS | Status: AC
  Administered 2011-09-09: 3.375 g via INTRAVENOUS
  Filled 2011-09-09: qty 50

## 2011-09-09 MED ORDER — VANCOMYCIN HCL IN DEXTROSE 1-5 GM/200ML-% IV SOLN
1000.0000 mg | INTRAVENOUS | Status: AC
  Administered 2011-09-09: 1000 mg via INTRAVENOUS
  Filled 2011-09-09: qty 200

## 2011-09-09 MED ORDER — IPRATROPIUM-ALBUTEROL 18-103 MCG/ACT IN AERO
2.0000 | INHALATION_SPRAY | Freq: Four times a day (QID) | RESPIRATORY_TRACT | Status: DC | PRN
  Filled 2011-09-09: qty 14.7

## 2011-09-09 MED ORDER — PIPERACILLIN-TAZOBACTAM 3.375 G IVPB
3.3750 g | Freq: Three times a day (TID) | INTRAVENOUS | Status: DC
  Administered 2011-09-10: 3.375 g via INTRAVENOUS
  Filled 2011-09-09 (×4): qty 50

## 2011-09-09 MED ORDER — VANCOMYCIN HCL 500 MG IV SOLR
500.0000 mg | Freq: Once | INTRAVENOUS | Status: AC
  Administered 2011-09-09: 500 mg via INTRAVENOUS
  Filled 2011-09-09: qty 500

## 2011-09-09 MED ORDER — NAPROXEN 500 MG PO TABS
500.0000 mg | ORAL_TABLET | Freq: Two times a day (BID) | ORAL | Status: DC
  Administered 2011-09-09 – 2011-09-11 (×4): 500 mg via ORAL
  Filled 2011-09-09 (×5): qty 1

## 2011-09-09 NOTE — ED Provider Notes (Signed)
History     CSN: [id number]  Arrival date & time 09/09/11  1123   First MD Initiated Contact with Patient 09/09/11 1133      Chief Complaint  Patient presents with  . Fever    (Consider location/radiation/quality/duration/timing/severity/associated sxs/prior treatment) HPI Comments: Patient who was admitted to the hospital 07/25/11-08/06/11 with listeria meningo-encephalopathy, resolved at 08/26/11 appointment, presents today with 2 days of fever to 102, weakness, dizziness, feeling off balance, cough productive of clear sputum, neck stiffness.  Denies CP, abdominal pain, N/V, urinary symptoms.  Bowel movements are normal.  SOB is baseline.    Patient is a 78 y.o. male presenting with fever. The history is provided by the patient and the spouse.  Fever Primary symptoms of the febrile illness include fever.    Past Medical History  Diagnosis Date  . COPD (chronic obstructive pulmonary disease)   . CAD (coronary artery disease)   . HTN (hypertension)   . PUD (peptic ulcer disease)   . Syncope 07/25/11    x 2 today    Past Surgical History  Procedure Date  . Cataract extraction   . Gastrectomy   . Transurethral resection of prostate   . Coronary angioplasty with stent placement     stent in 2003    Family History  Problem Relation Age of Onset  . Hypertension    . Coronary artery disease    . Stroke Mother     History  Substance Use Topics  . Smoking status: Former Games developer  . Smokeless tobacco: Not on file  . Alcohol Use: No     hx etoh abuse in past, daughter states quit over 20 years ago      Review of Systems  Constitutional: Positive for fever.    Allergies  Review of patient's allergies indicates no known allergies.  Home Medications   Current Outpatient Rx  Name Route Sig Dispense Refill  . IPRATROPIUM-ALBUTEROL 18-103 MCG/ACT IN AERO Inhalation Inhale 2 puffs into the lungs every 6 (six) hours as needed.      . ASPIRIN 81 MG PO TABS Oral Take 81 mg  by mouth daily.      . ATORVASTATIN CALCIUM 10 MG PO TABS Oral Take 1 tablet (10 mg total) by mouth daily. 10 tablet 0  . ENSURE CLINICAL ST REVIGOR PO LIQD Oral Take 237 mLs by mouth 2 (two) times daily between meals.    Marland Kitchen PRO-STAT 64 PO LIQD Oral Take 30 mLs by mouth 2 (two) times daily. 900 mL   . METOPROLOL TARTRATE 25 MG PO TABS Oral Take 1 tablet (25 mg total) by mouth 2 (two) times daily. 20 tablet 0  . OLMESARTAN MEDOXOMIL 20 MG PO TABS Oral Take 1 tablet (20 mg total) by mouth daily. 5 tablet 0  . PANTOPRAZOLE SODIUM 40 MG PO TBEC Oral Take 1 tablet (40 mg total) by mouth daily with breakfast.    . AMPICILLIN IVPB 2 G/50 ML NS Intravenous Inject 2 g into the vein every 6 (six) hours.      Until 08/22/11  . TEMAZEPAM 30 MG PO CAPS Oral Take 30 mg by mouth at bedtime as needed.      Marland Kitchen TIOTROPIUM BROMIDE MONOHYDRATE 18 MCG IN CAPS Inhalation Place 18 mcg into inhaler and inhale daily.        BP 178/78  Pulse 83  Temp(Src) 98.1 F (36.7 C) (Oral)  Resp 16  SpO2 97%  Physical Exam  Nursing note and vitals reviewed. Constitutional:  He is oriented to person, place, and time. He appears well-developed and well-nourished.  HENT:  Head: Normocephalic and atraumatic.  Neck: Normal range of motion. Neck supple. No Brudzinski's sign noted.  Cardiovascular: Normal rate, regular rhythm and normal heart sounds.   Pulmonary/Chest: Effort normal. No stridor. No respiratory distress. He has no wheezes. He has rhonchi. He has no rales.  Abdominal: Soft. Bowel sounds are normal. He exhibits no distension. There is no tenderness. There is no rebound and no guarding.  Musculoskeletal: He exhibits no edema and no tenderness.  Neurological: He is alert and oriented to person, place, and time. He has normal strength. No cranial nerve deficit or sensory deficit. He exhibits normal muscle tone. Coordination normal.       CN II-XII intact, EOMs intact, no pronator drift, grip strengths equal, finger to nose  is normal.   Psychiatric: He has a normal mood and affect. His behavior is normal.    ED Course  Procedures (including critical care time)  Labs Reviewed  CBC - Abnormal; Notable for the following:    RBC 3.53 (*)    Hemoglobin 11.3 (*)    HCT 33.7 (*)    All other components within normal limits  DIFFERENTIAL - Abnormal; Notable for the following:    Lymphocytes Relative 11 (*)    All other components within normal limits  COMPREHENSIVE METABOLIC PANEL - Abnormal; Notable for the following:    Potassium 3.1 (*)    Albumin 3.0 (*)    AST 41 (*)    GFR calc non Af Amer 64 (*)    GFR calc Af Amer 74 (*)    All other components within normal limits  URINALYSIS, ROUTINE W REFLEX MICROSCOPIC   Dg Chest 2 View  09/09/2011  *RADIOLOGY REPORT*  Clinical Data: Fever, cough, congestion  CHEST - 2 VIEW  Comparison: July 25, 2011 and September 21, 2006  Findings: Chronic interstitial fibrotic disease is again noted. The cardiac silhouette, mediastinum, pulmonary vasculature are within normal limits. There is patchy opacity posteriorly on the lateral view and mild blunting of the right costophrenic angle, worrisome for infiltrate and small effusion.  The bones are osteopenic and there is anterior wedging of an upper thoracic vertebral body which has developed since the 2008 chest x-ray.  IMPRESSION: Question developing infiltrate and small effusion within the posterior right lung base.  Original Report Authenticated By: Brandon Melnick, M.D.   Ct Head Wo Contrast  09/09/2011  *RADIOLOGY REPORT*  Clinical Data: Dizziness.  CT HEAD WITHOUT CONTRAST  Technique:  Contiguous axial images were obtained from the base of the skull through the vertex without contrast.  Comparison: CT 07/25/2011  Findings: Generalized atrophy.  Chronic ischemic changes are present throughout the cerebral white matter bilaterally.  This is unchanged.  Negative for acute infarct.  Negative for hemorrhage or mass.  IMPRESSION:  Atrophy and chronic ischemic change.  No acute abnormality.  Original Report Authenticated By: Camelia Phenes, M.D.    12:27 PM Discussed patient with Dr Ethelda Chick who will also see the patient.    1. HCAP (healthcare-associated pneumonia)       MDM  Patient with hospitalization last month for meningitis returns today with fever (102 at home), weakness, cough, decreased O2 saturation at home (reported 88% room air) found to have pneumonia.  Antibiotics ordered for HCAP.  Patient's vital signs are stable, he is oxygenating well currently and has no increased work of breathing - did not order blood cultures  as patient will not require support of ICU on admission.  Though patient is concerned about stiff neck, patient's neck is supple and he has full AROM, negative brudzinski's sign.  Doubt meningitis.  Patient admitted to [facility] to regular bed.          Dillard Cannon [place], [place] 09/09/11 1839

## 2011-09-09 NOTE — ED Provider Notes (Signed)
Patient with cough for the past 4-5 days, accompanied by fever. In generalized weakness. On exam speaks in sentences no respiratory distress lungs scant rhonchi  Doug Sou, MD 09/09/11 1330

## 2011-09-09 NOTE — ED Notes (Signed)
[id number]<AV> Expected date:09/09/11<BR> Expected time:11:15 AM<BR> Means of arrival:Ambulance<BR> Comments:<BR> EMS 15 GC, 84 yom fever/ sepsis Hx. Of meningitis

## 2011-09-09 NOTE — ED Notes (Signed)
Pt was given a urinal.

## 2011-09-09 NOTE — ED Provider Notes (Signed)
Medical screening examination/treatment/procedure(s) were conducted as a shared visit with non-physician practitioner(s) and myself.  I personally evaluated the patient during the encounter  Tysheena Ginzburg, MD 09/09/11 2325 

## 2011-09-09 NOTE — Progress Notes (Signed)
ANTIBIOTIC CONSULT NOTE - INITIAL  Pharmacy Consult for Vancomycin/Zosyn Indication: pneumonia  No Known Allergies  Patient Measurements:   Body weight: 54kg as of 08/26/11  Vital Signs: Temp: 98.9 F (37.2 C) (01/22 1615) Temp src: Oral (01/22 1615) BP: 145/69 mmHg (01/22 1615) Pulse Rate: 76  (01/22 1615)  Labs:  Basename 09/09/11 1154  WBC 7.2  HGB 11.3*  PLT 162  LABCREA --  CREATININE 1.05   The CrCl is unknown because both a height and weight (above a minimum accepted value) are required for this calculation.  Microbiology: No results found for this or any previous visit (from the past 720 hour(s)).  Medical History: Past Medical History  Diagnosis Date  . COPD (chronic obstructive pulmonary disease)   . CAD (coronary artery disease)   . HTN (hypertension)   . PUD (peptic ulcer disease)   . Syncope 07/25/11    x 2 today    Medications:  Scheduled:    . sodium chloride   Intravenous Once  . sodium chloride   Intravenous Once  . aspirin  81 mg Oral Daily  . cholecalciferol  1,000 Units Oral Daily  . enoxaparin  40 mg Subcutaneous Q24H  . feeding supplement  237 mL Oral BID BM  . levofloxacin (LEVAQUIN) IV  500 mg Intravenous Q24H  . metoprolol tartrate  25 mg Oral BID  . naproxen  500 mg Oral BID WC  . ofloxacin  1-4 drop Both Eyes QID  . olmesartan  10 mg Oral Daily  . pantoprazole  40 mg Oral Q1200  . piperacillin-tazobactam (ZOSYN)  IV  3.375 g Intravenous To ER  . polyvinyl alcohol  2 drop Both Eyes Daily  . potassium chloride  40 mEq Oral Once  . simvastatin  20 mg Oral q1800  . tiotropium  18 mcg Inhalation Daily  . vancomycin  1,000 mg Intravenous To ER  . DISCONTD: hydroxypropyl methylcellulose  2 drop Both Eyes Daily  . DISCONTD: naproxen sodium  412.5 mg Oral BID WC   Infusions:   Assessment: 84YOM to begin empiric treatment for HCAP with vancomycin, Levaquin, and Zosyn.  Estimated CrCl (CG)~ 41 ml/min.  Goal of Therapy:  Vancomycin  trough level 15-20 mcg/ml  Plan:  Zosyn 3.375g IV q8h (4 hour infusion time). Vancomycin 500mg  IV q12h.  F/u SCr and obtain trough levels as needed.  Clance Boll 09/09/2011,6:08 PM

## 2011-09-09 NOTE — ED Notes (Signed)
Pt to ED with fever since yesterday. Pt with c/o general weakness and fatigue. Pt denies any other complaints. Pt with hx of bacterial and viral meningitis

## 2011-09-09 NOTE — H&P (Addendum)
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 Patient reports worsening cough, chest congestion for 5-6 days and fever and chills for the last 2days. His wife notes that his temperature was 102F yesterday and when the physical therapist came in for home health he noted that his oxygen saturation was 88% on room air and subsequently came to the ER to be evaluated, and he was found to have a right lower lobe pneumonia. [facility] were consulted for further evaluation and management.  Allergies:  No Known Allergies    Past Medical History  Diagnosis Date  . COPD (chronic obstructive pulmonary disease)   . CAD (coronary artery disease)   . HTN (hypertension)   . PUD (peptic ulcer disease)   . Syncope 07/25/11    x 2 today   listeria meningitis  Past Surgical History  Procedure Date  . Cataract extraction   . Gastrectomy   . Transurethral resection of prostate   . Coronary angioplasty with stent placement     stent in 2003    Prior to Admission medications   Medication Sig Start Date End Date Taking? Authorizing Provider  albuterol-ipratropium (COMBIVENT) 18-103 MCG/ACT inhaler Inhale 2 puffs into the lungs every 6 (six) hours as needed.     Yes Historical Provider, MD  aspirin 81 MG chewable tablet Chew 81 mg by mouth daily.   Yes Historical Provider, MD  atorvastatin (LIPITOR) 10 MG tablet Take 1 tablet (10 mg total) by mouth daily. 08/06/11 08/05/12 Yes Leroy Sea, MD  cholecalciferol (VITAMIN D) 1000 UNITS tablet Take 1,000 Units by mouth daily.   Yes Historical Provider, MD  feeding supplement (ENSURE CLINICAL STRENGTH) LIQD Take 237 mLs by mouth 2 (two) times daily between meals. 08/05/11   Yes Michelle A. Ashley Royalty, MD  hydroxypropyl methylcellulose (ISOPTO TEARS) 2.5 % ophthalmic solution Place 2 drops into both eyes daily.   Yes Historical Provider, MD  metoprolol tartrate (LOPRESSOR) 25 MG tablet Take 1 tablet (25 mg total) by mouth 2 (two) times daily. 08/06/11 08/05/12 Yes Leroy Sea, MD  naproxen sodium (ANAPROX) 220 MG tablet Take 440 mg by mouth 2 (two) times daily with a meal.   Yes Historical Provider, MD  ofloxacin (OCUFLOX) 0.3 % ophthalmic solution Place 1-4 drops into both eyes 4 (four) times daily. Use 4 drops four times daily into right eye, and 1 drop once daily into left eye   Yes Historical Provider, MD  pantoprazole (PROTONIX) 40 MG tablet Take 1 tablet (40 mg total) by mouth daily with breakfast. 08/05/11 08/04/12 Yes Michelle A. Ashley Royalty, MD  pravastatin (PRAVACHOL) 40 MG tablet Take 40 mg by mouth daily.   Yes Historical Provider, MD  telmisartan (MICARDIS) 80 MG tablet Take 40 mg by mouth daily.   Yes Historical Provider, MD  temazepam (RESTORIL) 30 MG capsule Take 30 mg by mouth at bedtime as needed. For sleep   Yes Historical Provider, MD  tiotropium (SPIRIVA) 18 MCG inhalation capsule Place 18 mcg into inhaler and inhale daily.     Yes Historical Provider, MD    Social History:  Married lives at home with his wife,  former smoker quit 14 years ago denies alcohol use, currently independent in ADLs and ambulates  with her walker  Family History  Problem Relation Age of Onset  . Hypertension    . Coronary artery disease    . Stroke Mother     Review of Systems: Positives bolded Constitutional: Denies fever, chills, diaphoresis, appetite change and fatigue.  HEENT: Denies photophobia, eye pain, redness, hearing loss, ear pain, congestion, sore throat, rhinorrhea, sneezing, mouth sores, trouble swallowing, neck pain, neck stiffness and tinnitus.   Respiratory: Denies SOB, DOE, cough, chest tightness,  and wheezing.   Cardiovascular: Denies chest  pain, palpitations and leg swelling.  Gastrointestinal: Denies nausea, vomiting, abdominal pain, diarrhea, constipation, blood in stool and abdominal distention.  Genitourinary: Denies dysuria, urgency, frequency, hematuria, flank pain and difficulty urinating.  Musculoskeletal: Denies myalgias, back pain, joint swelling, arthralgias and gait problem.  Skin: Denies pallor, rash and wound.  Neurological: Denies dizziness, seizures, syncope, weakness, light-headedness, numbness and headaches.  Hematological: Denies adenopathy. Easy bruising, personal or family bleeding history  Psychiatric/Behavioral: Denies suicidal ideation, mood changes, confusion, nervousness, sleep disturbance and agitation   Physical Exam: Blood pressure 178/78, pulse 83, temperature 100.2 F (37.9 C), temperature source Rectal, resp. rate 16, SpO2 97.00%. Thinly built white male lying on the stretcher in no acute distress he is alert awake oriented x3 HEENT pupils equal reactive to light oral mucosa is dry neck no JVD or lymphadenopathy CVS S1-S2 regular rate rhythm no murmurs rubs or gallops Lungs crackles at the right lung base, diffuse rhonchi in the right middle lobe Abdomen is soft nontender with normal bowel sounds no organomegaly Extremities no edema clubbing or cyanosis Neuro moves all extremities no localizing signs  Labs on Admission:  Results for orders placed during the hospital encounter of 09/09/11 (from the past 48 hour(s))  CBC     Status: Abnormal   Collection Time   09/09/11 11:54 AM      Component Value Range Comment   WBC 7.2  4.0 - 10.5 (K/uL)    RBC 3.53 (*) 4.22 - 5.81 (MIL/uL)    Hemoglobin 11.3 (*) 13.0 - 17.0 (g/dL)    HCT 47.8 (*) 29.5 - 52.0 (%)    MCV 95.5  78.0 - 100.0 (fL)    MCH 32.0  26.0 - 34.0 (pg)    MCHC 33.5  30.0 - 36.0 (g/dL)    RDW 62.1  30.8 - 65.7 (%)    Platelets 162  150 - 400 (K/uL)   DIFFERENTIAL     Status: Abnormal   Collection Time   09/09/11 11:54 AM       Component Value Range Comment   Neutrophils Relative 77  43 - 77 (%)    Neutro Abs 5.5  1.7 - 7.7 (K/uL)    Lymphocytes Relative 11 (*) 12 - 46 (%)    Lymphs Abs 0.8  0.7 - 4.0 (K/uL)    Monocytes Relative 9  3 - 12 (%)    Monocytes Absolute 0.7  0.1 - 1.0 (K/uL)    Eosinophils Relative 3  0 - 5 (%)    Eosinophils Absolute 0.2  0.0 - 0.7 (K/uL)    Basophils Relative 0  0 - 1 (%)    Basophils Absolute 0.0  0.0 - 0.1 (K/uL)   COMPREHENSIVE METABOLIC PANEL     Status: Abnormal   Collection Time   09/09/11 11:54 AM      Component Value Range Comment   Sodium 141  135 - 145 (mEq/L)    Potassium 3.1 (*) 3.5 - 5.1 (mEq/L)  Chloride 103  96 - 112 (mEq/L)    CO2 27  19 - 32 (mEq/L)    Glucose, Bld 94  70 - 99 (mg/dL)    BUN 11  6 - 23 (mg/dL)    Creatinine, Ser 9.60  0.50 - 1.35 (mg/dL)    Calcium 9.4  8.4 - 10.5 (mg/dL)    Total Protein 6.3  6.0 - 8.3 (g/dL)    Albumin 3.0 (*) 3.5 - 5.2 (g/dL)    AST 41 (*) 0 - 37 (U/L)    ALT 43  0 - 53 (U/L)    Alkaline Phosphatase 87  39 - 117 (U/L)    Total Bilirubin 0.4  0.3 - 1.2 (mg/dL)    GFR calc non Af Amer 64 (*) >90 (mL/min)    GFR calc Af Amer 74 (*) >90 (mL/min)     Radiological Exams on Admission: Dg Chest 2 View  09/09/2011  *RADIOLOGY REPORT*  Clinical Data: Fever, cough, congestion  CHEST - 2 VIEW  Comparison: July 25, 2011 and September 21, 2006  Findings: Chronic interstitial fibrotic disease is again noted. The cardiac silhouette, mediastinum, pulmonary vasculature are within normal limits. There is patchy opacity posteriorly on the lateral view and mild blunting of the right costophrenic angle, worrisome for infiltrate and small effusion.  The bones are osteopenic and there is anterior wedging of an upper thoracic vertebral body which has developed since the 2008 chest x-ray.  IMPRESSION: Question developing infiltrate and small effusion within the posterior right lung base.  Original Report Authenticated By: Brandon Melnick, M.D.    Ct Head Wo Contrast  09/09/2011  *RADIOLOGY REPORT*  Clinical Data: Dizziness.  CT HEAD WITHOUT CONTRAST  Technique:  Contiguous axial images were obtained from the base of the skull through the vertex without contrast.  Comparison: CT 07/25/2011  Findings: Generalized atrophy.  Chronic ischemic changes are present throughout the cerebral white matter bilaterally.  This is unchanged.  Negative for acute infarct.  Negative for hemorrhage or mass.  IMPRESSION: Atrophy and chronic ischemic change.  No acute abnormality.  Original Report Authenticated By: Camelia Phenes, M.D.    Assessment/Plan 1. Healthcare-associated pneumonia versus aspiration pneumonia 2. COPD (chronic obstructive pulmonary disease) 3. CAD (coronary artery disease) 4. Hypokalemia Plan Will admit him to the regular medical floor, Will cover her for healthcare associated pneumonia with IV vancomycin and Zosyn and Levaquin Check blood cultures and sputum cultures and de-escalate antibiotics accordingly, and influenza PCR Check swallow evaluation to rule out silent aspiration. Gentle IV fluids, Mucinex PRN Replace potassium Continue rest of his chronic medications Further management his condition evolves  CODE STATUS discussed with patient and wife he is a DO NOT RESUSCITATE   Time Spent on Admission:  Arriana Lohmann [facility] Pager: [phone] 09/09/2011, 2:34 PM

## 2011-09-10 LAB — CBC
Hemoglobin: 9.6 g/dL — ABNORMAL LOW (ref 13.0–17.0)
Platelets: 150 10*3/uL (ref 150–400)
RBC: 3.09 MIL/uL — ABNORMAL LOW (ref 4.22–5.81)
WBC: 6 10*3/uL (ref 4.0–10.5)

## 2011-09-10 LAB — BASIC METABOLIC PANEL
CO2: 25 mEq/L (ref 19–32)
Chloride: 106 mEq/L (ref 96–112)
Glucose, Bld: 92 mg/dL (ref 70–99)
Sodium: 139 mEq/L (ref 135–145)

## 2011-09-10 NOTE — Progress Notes (Signed)
PROGRESS NOTE  Alec Walker KGM:[id number] DOB: 12/11/1926 DOA: 09/09/2011 PCP: Eartha Inch, MD, MD  Brief narrative: 78 year old man recently admitted in December of 2012 posterior meningitis who presented with a several day history of productive cough as well as history of fever for 2 days. Also complained of neck stiffness and dizziness. Found to be hypoxic in the emergency department. Diagnosed with pneumonia and started on antibiotics.  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 Finished ampicillin January 2.  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 Able to feed self. No overt signs or symptoms of aspiration.  Procedures:  None  Antibiotics:  January 22: Levaquin  January 22-23: Zosyn  January 22-23: Vancomycin  Interim History: Blood cultures pending. Subjective: Feels better. Breathing well. No neck pain. No complaints.  Objective: Filed Vitals:   09/09/11 1700 09/09/11 2117 09/10/11 0528 09/10/11 0909  BP:  170/76 123/74   Pulse:  82 72   Temp:  99.1 F (37.3 C) 97.7 F (36.5 C)   TempSrc:  Oral Oral   Resp:  22 20   Height: 5\' 10"  (1.778 m)     Weight: 54.432 kg (120 lb)     SpO2:  93% 94% 94%    Intake/Output Summary (Last 24 hours) at 09/10/11 1018 Last data filed at 09/10/11 0450  Gross per 24 hour  Intake    500 ml  Output      0 ml  Net    500 ml    Exam:  General: Appears calm and comfortable. Cardiovascular: Regular rate and rhythm. No murmur, rub or gallop. No lower extremity edema. Respiratory: Decreased breath sounds posteriorly bilaterally. Otherwise clear to auscultation bilaterally. No wheezes, rales or rhonchi. Normal respiratory effort.  Data Reviewed: Basic Metabolic Panel:  Lab 09/10/11 6440 09/09/11 1913 09/09/11 1154  NA  139 -- 141  K 3.7 -- 3.1*  CL 106 -- 103  CO2 25 -- 27  GLUCOSE 92 -- 94  BUN 13 -- 11  CREATININE 1.18 1.07 1.05  CALCIUM 8.7 -- 9.4  MG -- -- --  PHOS -- -- --   Liver Function Tests:  Lab 09/09/11 1154  AST 41*  ALT 43  ALKPHOS 87  BILITOT 0.4  PROT 6.3  ALBUMIN 3.0*   CBC:  Lab 09/10/11 0325 09/09/11 1913 09/09/11 1154  WBC 6.0 6.3 7.2  NEUTROABS -- -- 5.5  HGB 9.6* 10.2* 11.3*  HCT 29.4* 31.6* 33.7*  MCV 95.1 95.2 95.5  PLT 150 162 162   Studies: Dg Chest 2 View  09/09/2011  *RADIOLOGY REPORT*  Clinical Data: Fever, cough, congestion  CHEST - 2 VIEW  Comparison: July 25, 2011 and September 21, 2006  Findings: Chronic interstitial fibrotic disease is again noted. The cardiac silhouette, mediastinum, pulmonary vasculature are within normal limits. There is patchy opacity posteriorly on the lateral view and mild blunting of the right costophrenic angle, worrisome for infiltrate and small effusion.  The bones are osteopenic and there is anterior wedging of an upper thoracic vertebral body which has developed since the 2008 chest x-ray.  IMPRESSION: Question developing infiltrate and small effusion within the posterior right lung base.  Original Report Authenticated By: Brandon Melnick, M.D.   Ct Head Wo Contrast  09/09/2011  *RADIOLOGY REPORT*  Clinical Data: Dizziness.  CT HEAD WITHOUT CONTRAST  Technique:  Contiguous axial images  were obtained from the base of the skull through the vertex without contrast.  Comparison: CT 07/25/2011  Findings: Generalized atrophy.  Chronic ischemic changes are present throughout the cerebral white matter bilaterally.  This is unchanged.  Negative for acute infarct.  Negative for hemorrhage or mass.  IMPRESSION: Atrophy and chronic ischemic change.  No acute abnormality.  Original Report Authenticated By: Camelia Phenes, M.D.   Scheduled Meds:   . sodium chloride   Intravenous Once  . sodium chloride   Intravenous Once  . aspirin  81 mg  Oral Daily  . cholecalciferol  1,000 Units Oral Daily  . enoxaparin  40 mg Subcutaneous Q24H  . feeding supplement  237 mL Oral BID BM  . levofloxacin (LEVAQUIN) IV  500 mg Intravenous Q24H  . metoprolol tartrate  25 mg Oral BID  . naproxen  500 mg Oral BID WC  . ofloxacin  1-4 drop Both Eyes QID  . olmesartan  10 mg Oral Daily  . pantoprazole  40 mg Oral Q1200  . piperacillin-tazobactam (ZOSYN)  IV  3.375 g Intravenous To ER  . piperacillin-tazobactam (ZOSYN)  IV  3.375 g Intravenous Q8H  . piperacillin-tazobactam (ZOSYN)  IV  3.375 g Intravenous Once  . polyvinyl alcohol  2 drop Both Eyes Daily  . potassium chloride  40 mEq Oral Once  . simvastatin  20 mg Oral q1800  . tiotropium  18 mcg Inhalation Daily  . vancomycin  500 mg Intravenous Q12H  . vancomycin  500 mg Intravenous Once  . vancomycin  1,000 mg Intravenous To ER  . DISCONTD: hydroxypropyl methylcellulose  2 drop Both Eyes Daily  . DISCONTD: naproxen sodium  412.5 mg Oral BID WC   Continuous Infusions:    Assessment/Plan: 1. Healthcare associated pneumonia: Improving. Doubt multi-drug-resistant organisms or MRSA. Narrow antibiotics to monotherapy with Levaquin. Influenza PCR negative. Speech therapy evaluation noted. Will not pursue any further evaluation at this time. 2. Hypokalemia: Repleted. 3. Normocytic anemia: Stable. 4. COPD: Appears to be stable. Continue Spiriva.  Code Status: DO NOT RESUSCITATE Family Communication:  Disposition Plan: Anticipate discharge January 24.   Brendia Sacks, MD  [facility] Pager [phone] 09/10/2011, 10:18 AM    LOS: 1 day

## 2011-09-10 NOTE — Progress Notes (Signed)
Daughter from [place] called and was concerned about his status - Confirmed patient room number and made her aware to call the patient rooms to rec additional information. Confirmed with patient that he has daughter that lives in [place].

## 2011-09-10 NOTE — Plan of Care (Signed)
Problem: Phase I Progression Outcomes Goal: OOB as tolerated unless otherwise ordered Outcome: Completed/Met Date Met:  09/10/11 OOB with one person assist

## 2011-09-10 NOTE — Progress Notes (Signed)
Speech Language/Pathology Clinical/Bedside Swallow Evaluation Patient Details  Name: Alec Walker MRN: [id number] DOB: 05/10/1927 Today's Date: 09/10/2011 [id number]  Past Medical History:  Past Medical History  Diagnosis Date  . COPD (chronic obstructive pulmonary disease)   . CAD (coronary artery disease)   . HTN (hypertension)   . PUD (peptic ulcer disease)   . Syncope 07/25/11    x 2 today   Past Surgical History:  Past Surgical History  Procedure Date  . Cataract extraction   . Gastrectomy   . Transurethral resection of prostate   . Coronary angioplasty with stent placement     stent in [address]   HPI:  78 year old male adm to [facility] 09/09/11 with cough, CXR with questionable developing infiltrate and small effusion within right posterior lung base.  Pt with COPD, hypokalemia, CAD, now with Asp pna vs HCAP.  CT Head 09/09/11 showed atrophy and chronic ischemia.  Jul 25, 2011 CXR negative.  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  Pt was receiving home health PT and went home with a walker from his last admission. Pt acknowledges history of surgery to remove portion of stomach 20 years ago, note surgical clips seen at GE juncture on CXR 2008.   Pt denies significant problems swallowing,  does experience sensation of food stasis in throat a few times a week, causing him to cough.  Pt takes liquids to clear.  Pt verbalized if he chews his food thoroughly, it eliminates symptoms.  Sensation of stasis occurs at phayrnx and esophagus at times per pt.     Assessment/Recommendations/Treatment Plan Suspected Esophageal Findings Suspected Esophageal Findings: Other (comment) (on Protonix )  SLP Assessment Clinical Impression Statement: Pt appears with functional swallow without clinical s/s of aspiration with 3 ounces of applesauce, 4 graham crackers with peanut butter.  Timely swallow with clear voice  throughout.  Pt repored that swallow ability is at baseline.  Given h/o partial stomach removal 20 years ago (d/t ulcers from ETOH use per pt) and pt is on Protonix, ?  of mild esophageal issues.  Pt does admit occasional sensation of food lodging in throat and/or lower esophagus causing him to cough, clears with liquids.  SLP questions if pharyngeal sensation is referrant d/t crossover of CN.   SLP can not rule out silent aspiration at bedside, but do not suspect this is likely.  Pt denies h/o pna, stating this is his first pna.  Educated pt to general aspiration precautions given his diagnosis of COPD and risk of episodic aspiration.  Pt verbalized understanding of information.  MD Please ORDER MBS if DESIRE TO R/O OVERT SILENT ASPIRATION.  Thanks.     Risk for Aspiration: Mild Other Related Risk Factors: History of esophageal-related issues;Decreased respiratory status  Swallow Evaluation Recommendations Solid Consistency: Regular (SOFT FOODS d/t dentition) Liquid Consistency: Thin Liquid Administration via: Cup;Straw Medication Administration: Whole meds with liquid Supervision: Patient able to self feed Compensations: Slow rate;Small sips/bites (pt takes small bites/sips) Postural Changes and/or Swallow Maneuvers: Seated upright 90 degrees;Upright 30-60 min after meal Recommendations for Other Services: OT consult;PT consult Follow up Recommendations: None  Treatment Plan Speech Therapy Frequency: min 1 x/week Treatment Duration: 1 week Interventions: Aspiration precaution training;Compensatory techniques  Prognosis Prognosis for Safe Diet Advancement: Good  Individuals Consulted Consulted and Agree with Results and Recommendations: Patient  Swallowing Goals  SLP Swallowing Goals Patient will utilize recommended strategies  during swallow to increase swallowing safety with: Minimal assistance  Swallow Study Prior Functional Status   Lives with wife, independent.   General  Date  of Onset: 09/10/11 HPI: 78 year old male adm to [facility] 09/09/11 with cough, CXR with questionable developing infiltrate and small effusion within right posterior lung base.  Pt with COPD, hypokalemia, CAD, now with Asp pna vs HCAP.  CT Head 09/09/11 showed atrophy and chronic ischemia.  Jul 25, 2011 CXR negative.  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  Pt was receiving home health PT and went home with a walker from his last admission. Pt acknowledges history of surgery to remove portion of stomach 20 years ago, note surgical clips seen at GE juncture on CXR 2008.   Pt denies significant problems swallowing,  does experience sensation of food stasis in throat a few times a week, causing him to cough.  Pt takes liquids to clear.  Pt verbalized if he chews his food thoroughly, it eliminates symptoms.  Sensation of stasis occurs at phayrnx and esophagus at times per pt.   Type of Study: Bedside swallow evaluation Diet Prior to this Study: Regular;Thin liquids Respiratory Status: Supplemental O2 delivered via (comment) (on home oxygen 2 liters x2 weeks) History of Intubation: No Behavior/Cognition: Alert;Pleasant mood Oral Cavity - Dentition: Missing dentition (8 lowers, endentulous upper-no dentures) Vision: Functional for self-feeding Patient Positioning: Upright in bed Baseline Vocal Quality: Clear Volitional Cough: Strong Volitional Swallow: Able to elicit  Oral Motor/Sensory Function  Overall Oral Motor/Sensory Function: Appears within functional limits for tasks assessed Facial Symmetry: Left droop (pt states this is baseline)  Consistency Results  Ice Chips Ice chips: Not tested  Thin Liquid Thin Liquid: Within functional limits Presentation: Cup;Self Fed  Nectar Thick Liquid Nectar Thick Liquid: Not tested  Honey Thick Liquid Honey Thick Liquid: Not tested  Puree Puree: Within functional  limits Presentation: Self Fed;Spoon  Solid Solid: Within functional limits Presentation: Self Fed   Chales Abrahams 09/10/2011,9:25 AM

## 2011-09-11 MED ORDER — LEVOFLOXACIN 750 MG PO TABS
750.0000 mg | ORAL_TABLET | ORAL | Status: DC
  Filled 2011-09-11: qty 1

## 2011-09-11 MED ORDER — LEVOFLOXACIN 750 MG PO TABS: 750.0000 mg | ORAL_TABLET | ORAL | Status: AC

## 2011-09-11 NOTE — Progress Notes (Signed)
PROGRESS NOTE  Alec Walker ZOX:[id number] DOB: [birth year] DOA: 09/09/2011 PCP: Eartha Inch, MD, MD  Brief narrative: 78 year old man recently admitted in December of 2012 posterior meningitis who presented with a several day history of productive cough as well as history of fever for 2 days. Also complained of neck stiffness and dizziness. Found to be hypoxic in the emergency department. Diagnosed with pneumonia and started on antibiotics.  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 Finished ampicillin January 2.  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 Able to feed self. No overt signs or symptoms of aspiration.  Procedures:  None  Antibiotics:  January 22: Levaquin  January 22-23: Zosyn  January 22-23: Vancomycin  Interim History: Blood cultures pending. Blood cultures show no growth today. Subjective: Feels well. Ready go home.  Objective: Filed Vitals:   09/10/11 1700 09/10/11 1900 09/11/11 0520 09/11/11 1004  BP:  114/65 153/73   Pulse:  67 62   Temp:  97.7 F (36.5 C) 97.4 F (36.3 C)   TempSrc:  Oral Oral   Resp:  20 18   Height:      Weight:      SpO2: 100% 98% 99% 94%    Intake/Output Summary (Last 24 hours) at 09/11/11 1241 Last data filed at 09/11/11 0500  Gross per 24 hour  Intake    460 ml  Output    550 ml  Net    -90 ml    Exam:  General: Appears calm and comfortable. Cardiovascular: Regular rate and rhythm. No murmur, rub or gallop. No lower extremity edema. Respiratory: Clear to auscultation bilaterally. No wheezes, rales or rhonchi. Normal respiratory effort.  Data Reviewed: Basic Metabolic Panel:  Lab 09/10/11 8119 09/09/11 1913 09/09/11 1154  NA 139 -- 141  K 3.7 -- 3.1*  CL 106 -- 103  CO2 25 -- 27  GLUCOSE 92 -- 94  BUN 13  -- 11  CREATININE 1.18 1.07 1.05  CALCIUM 8.7 -- 9.4  MG -- -- --  PHOS -- -- --   Liver Function Tests:  Lab 09/09/11 1154  AST 41*  ALT 43  ALKPHOS 87  BILITOT 0.4  PROT 6.3  ALBUMIN 3.0*   CBC:  Lab 09/10/11 0325 09/09/11 1913 09/09/11 1154  WBC 6.0 6.3 7.2  NEUTROABS -- -- 5.5  HGB 9.6* 10.2* 11.3*  HCT 29.4* 31.6* 33.7*  MCV 95.1 95.2 95.5  PLT 150 162 162   Studies: Dg Chest 2 View  09/09/2011  *RADIOLOGY REPORT*  Clinical Data: Fever, cough, congestion  CHEST - 2 VIEW  Comparison: July 25, 2011 and September 21, 2006  Findings: Chronic interstitial fibrotic disease is again noted. The cardiac silhouette, mediastinum, pulmonary vasculature are within normal limits. There is patchy opacity posteriorly on the lateral view and mild blunting of the right costophrenic angle, worrisome for infiltrate and small effusion.  The bones are osteopenic and there is anterior wedging of an upper thoracic vertebral body which has developed since the 2008 chest x-ray.  IMPRESSION: Question developing infiltrate and small effusion within the posterior right lung base.  Original Report Authenticated By: Brandon Melnick, M.D.   Ct Head Wo Contrast  09/09/2011  *RADIOLOGY REPORT*  Clinical Data: Dizziness.  CT HEAD WITHOUT CONTRAST  Technique:  Contiguous axial images were obtained from the base of the skull through the vertex without  contrast.  Comparison: CT 07/25/2011  Findings: Generalized atrophy.  Chronic ischemic changes are present throughout the cerebral white matter bilaterally.  This is unchanged.  Negative for acute infarct.  Negative for hemorrhage or mass.  IMPRESSION: Atrophy and chronic ischemic change.  No acute abnormality.  Original Report Authenticated By: Camelia Phenes, M.D.   Scheduled Meds:    . aspirin  81 mg Oral Daily  . cholecalciferol  1,000 Units Oral Daily  . enoxaparin  40 mg Subcutaneous Q24H  . feeding supplement  237 mL Oral BID BM  . levofloxacin  (LEVAQUIN) IV  500 mg Intravenous Q24H  . metoprolol tartrate  25 mg Oral BID  . naproxen  500 mg Oral BID WC  . ofloxacin  1-4 drop Both Eyes QID  . olmesartan  10 mg Oral Daily  . pantoprazole  40 mg Oral Q1200  . polyvinyl alcohol  2 drop Both Eyes Daily  . simvastatin  20 mg Oral q1800  . tiotropium  18 mcg Inhalation Daily   Continuous Infusions:    Assessment/Plan: 1. Healthcare associated pneumonia: Continues to improve.. Doubt multi-drug-resistant organisms or MRSA. Continue monotherapy with Levaquin. Influenza PCR negative.  2. Hypokalemia: Repleted. 3. Normocytic anemia: Stable. 4. COPD: Appears to be stable. Continue Spiriva.  Resume home health RN/PT/OT/nurse's aide on discharge. Continue home oxygen as instructed.  Code Status: DO NOT RESUSCITATE  Disposition Plan: Home today.   Brendia Sacks, MD  [facility] Pager [phone] 09/11/2011, 12:41 PM    LOS: 2 days

## 2011-09-11 NOTE — Progress Notes (Signed)
Received call from Maureen Ralphs with [facility] agency - patient is receiving HHRN/ PT/ OT/ NURSES AIDE. When orders are writted for [facility] services , they will be faxed to [phone]. Abelino Derrick RN, BSN, MHA

## 2011-09-11 NOTE — Progress Notes (Signed)
Speech Language/Pathology [phone]  Education session  Lung Sounds:  clear Temperature: afebrile Intake documented as 75-100%.  100% of breakfast consumed.    Patient required:  Pt required min verbal cues to recall water being pH neutral and subsequently safest liquid to aspirate, Pt verbalized that he drinks [employer].   Clinical Impression:  Appeared with functional swallow,  No follow up required at this time as all education complete.   Recommendations:  Continue diet with general aspiration precautions  Pain:   none Intervention Required:   No  Goals: All Goals Met  Patient will utilize recommended strategies during swallow to increase swallowing safety with min assist.  Pt given tips (verbally and in writing) to utilize in future if dyspnea and COPD becomes significant issue.  Pt verbalized understanding to information and expressed gratitude.     Alec Burnet, MS [facility] SLP [phone]

## 2011-09-11 NOTE — Progress Notes (Signed)
Talked to patient about DCP; Patient was recently discharged from SNF after a 10 day stay home with [facility] services; Permission given to talk to his wife Lucendia Herrlich [phone]- patient is active with [facility] for HHC; Azalee Course RN [phone])  with [facility] called to determine what services they were providing; Patient has home 02 from [facility], walker, wheelchair. Julieanne Cotton will call CM back with more information regarding services. Abelino Derrick RN, BSN, MHA

## 2011-09-11 NOTE — Discharge Summary (Signed)
Physician Discharge Summary  Alec Walker ZOX:[id number] DOB: 1926/12/02 DOA: 09/09/2011  PCP: Eartha Inch, MD, MD  Admit date: 09/09/2011 Discharge date: 09/11/2011  Discharge Diagnoses:  1. Healthcare associated pneumonia 2. Hypokalemia, resolved 3. Normocytic anemia, stable 4. COPD, oxygen dependent, stable  Discharge Condition: Improved  Disposition: Home. Resume home health RN/PT/OT/nurse's aide on discharge. Continue home oxygen as instructed.  History of present illness:  78 year old man recently admitted in December of 2012 posterior meningitis who presented with a several day history of productive cough as well as history of fever for 2 days. Also complained of neck stiffness and dizziness. Found to be hypoxic in the emergency department. Diagnosed with pneumonia and started on antibiotics.  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 Finished ampicillin January 2.  Hospital Course:  Mr. Heid was admitted to medical floor. He was initially started on broad-spectrum antibiotic therapy. This was quickly narrowed monotherapy. His respiratory status continues to improve and is stable for discharge home. He will complete a course of Levaquin in the outpatient setting. He will resume home health as he was previously receiving. These and other issues outlined below. 1. Healthcare associated pneumonia: Continues to improve. Doubt multi-drug-resistant organisms or MRSA. Continue monotherapy with Levaquin. Influenza PCR negative.    2. Hypokalemia: Repleted.  3. Normocytic anemia: Stable.  4. COPD: Appears to be stable. Continue Spiriva.  Consultants:  Speech therapy: Soft foods secondary to dentition, thin liquids, medications whole with liquid. Able to feed self. No overt signs or symptoms of aspiration.  January 22: Levaquin   January 22-23: Zosyn   January 22-23: Vancomycin  Discharge Instructions  Discharge Orders    Future Orders Please Complete By Expires   Diet - low sodium heart healthy      Increase activity slowly        Medication List  As of 09/11/2011  1:36 PM   TAKE these medications         albuterol-ipratropium 18-103 MCG/ACT inhaler   Commonly known as: COMBIVENT   Inhale 2 puffs into the lungs every 6 (six) hours as needed.      aspirin 81 MG chewable tablet   Chew 81 mg by mouth daily.      atorvastatin 10 MG tablet   Commonly known as: LIPITOR   Take 1 tablet (10 mg total) by mouth daily.      cholecalciferol 1000 UNITS tablet   Commonly known as: VITAMIN D   Take 1,000 Units by mouth daily.      feeding supplement Liqd   Take 237 mLs by mouth 2 (two) times daily between meals.      hydroxypropyl methylcellulose 2.5 % ophthalmic solution   Commonly known as: ISOPTO TEARS   Place 2 drops into both eyes daily.      levofloxacin 750 MG tablet   Commonly known as: LEVAQUIN   Take 1 tablet (750 mg total) by mouth every other day. First dose January 25 AM.      metoprolol tartrate 25 MG tablet   Commonly known as: LOPRESSOR   Take 1 tablet (25 mg total) by mouth 2 (two) times daily.      naproxen sodium 220 MG tablet   Commonly known as: ANAPROX   Take 440 mg by mouth 2 (two) times daily with a meal.      ofloxacin 0.3 % ophthalmic solution   Commonly known as: OCUFLOX   Place 1-4 drops into both eyes 4 (four) times daily. Use 4 drops four  times daily into right eye, and 1 drop once daily into left eye      pantoprazole 40 MG tablet   Commonly known as: PROTONIX   Take 1 tablet (40 mg total) by mouth daily with breakfast.      pravastatin 40 MG tablet   Commonly known as: PRAVACHOL   Take 40 mg by mouth daily.      telmisartan 80 MG tablet   Commonly known as: MICARDIS   Take 40 mg by mouth daily.      temazepam 30 MG capsule   Commonly known as: RESTORIL   Take 30 mg by mouth at bedtime as needed. For sleep      tiotropium 18 MCG inhalation capsule   Commonly known as: SPIRIVA   Place 18 mcg into  inhaler and inhale daily.           Follow-up Information    Follow up with BADGER,MICHAEL C, MD in 1 week.          The results of significant diagnostics from this hospitalization (including imaging, microbiology, ancillary and laboratory) are listed below for reference.    Significant Diagnostic Studies: Dg Chest 2 View  09/09/2011  *RADIOLOGY REPORT*  Clinical Data: Fever, cough, congestion  CHEST - 2 VIEW  Comparison: July 25, 2011 and September 21, 2006  Findings: Chronic interstitial fibrotic disease is again noted. The cardiac silhouette, mediastinum, pulmonary vasculature are within normal limits. There is patchy opacity posteriorly on the lateral view and mild blunting of the right costophrenic angle, worrisome for infiltrate and small effusion.  The bones are osteopenic and there is anterior wedging of an upper thoracic vertebral body which has developed since the 2008 chest x-ray.  IMPRESSION: Question developing infiltrate and small effusion within the posterior right lung base.  Original Report Authenticated By: Brandon Melnick, M.D.   Ct Head Wo Contrast  09/09/2011  *RADIOLOGY REPORT*  Clinical Data: Dizziness.  CT HEAD WITHOUT CONTRAST  Technique:  Contiguous axial images were obtained from the base of the skull through the vertex without contrast.  Comparison: CT 07/25/2011  Findings: Generalized atrophy.  Chronic ischemic changes are present throughout the cerebral white matter bilaterally.  This is unchanged.  Negative for acute infarct.  Negative for hemorrhage or mass.  IMPRESSION: Atrophy and chronic ischemic change.  No acute abnormality.  Original Report Authenticated By: Camelia Phenes, M.D.    Microbiology: Recent Results (from the past 240 hour(s))  CULTURE, BLOOD (ROUTINE X 2)     Status: Normal (Preliminary result)   Collection Time   09/09/11  7:05 PM      Component Value Range Status Comment   Specimen Description BLOOD RIGHT HAND   Final    Special  Requests BOTTLES DRAWN AEROBIC AND ANAEROBIC 10CC   Final    Setup Time [id number]   Final    Culture     Final    Value:        BLOOD CULTURE RECEIVED NO GROWTH TO DATE CULTURE WILL BE HELD FOR 5 DAYS BEFORE ISSUING A FINAL NEGATIVE REPORT   Report Status PENDING   Incomplete   CULTURE, BLOOD (ROUTINE X 2)     Status: Normal (Preliminary result)   Collection Time   09/09/11  7:10 PM      Component Value Range Status Comment   Specimen Description BLOOD RIGHT ARM   Final    Special Requests BOTTLES DRAWN AEROBIC AND ANAEROBIC 10CC   Final  Setup Time [id number]   Final    Culture     Final    Value:        BLOOD CULTURE RECEIVED NO GROWTH TO DATE CULTURE WILL BE HELD FOR 5 DAYS BEFORE ISSUING A FINAL NEGATIVE REPORT   Report Status PENDING   Incomplete      Labs: Basic Metabolic Panel:  Lab 09/10/11 8119 09/09/11 1913 09/09/11 1154  NA 139 -- 141  K 3.7 -- 3.1*  CL 106 -- 103  CO2 25 -- 27  GLUCOSE 92 -- 94  BUN 13 -- 11  CREATININE 1.18 1.07 1.05  CALCIUM 8.7 -- 9.4  MG -- -- --  PHOS -- -- --   Liver Function Tests:  Lab 09/09/11 1154  AST 41*  ALT 43  ALKPHOS 87  BILITOT 0.4  PROT 6.3  ALBUMIN 3.0*   CBC:  Lab 09/10/11 0325 09/09/11 1913 09/09/11 1154  WBC 6.0 6.3 7.2  NEUTROABS -- -- 5.5  HGB 9.6* 10.2* 11.3*  HCT 29.4* 31.6* 33.7*  MCV 95.1 95.2 95.5  PLT 150 162 162    Time coordinating discharge: 35 minutes.  Signed:  Brendia Sacks, MD  [facility] 09/11/2011, 1:36 PM

## 2011-09-11 NOTE — Progress Notes (Signed)
New orders to wean O2 to room air.  O2 will be titrated and monitored, but of note is history of patient using home O2.

## 2011-09-16 LAB — CULTURE, BLOOD (ROUTINE X 2)
Culture  Setup Time: 201301230042
Culture: NO GROWTH

## 2012-05-03 ENCOUNTER — Telehealth: Payer: Self-pay | Admitting: Pulmonary Disease

## 2012-05-03 NOTE — Telephone Encounter (Signed)
I spoke with daughter and  She stated pt is going to have myocardial Perfusion test done the same day as appt with [facility] 05/05/12. Daughter is wanting to know if they should wait and r/s this appt with [facility] or should they keep appt. Looks like they are seeing [facility] for evaluation of COPD,SOB by Dr. Alanda Amass. Please advise KC thanks

## 2012-05-03 NOTE — Telephone Encounter (Signed)
Pt's daughter Aggie Cosier returning call can be reached at [phone] ext [phone] until 12p.Raylene Everts

## 2012-05-03 NOTE — Telephone Encounter (Signed)
Should be rescheduled since he is having cardiac testing.

## 2012-05-03 NOTE — Telephone Encounter (Signed)
I spoke with daughter and r/s pt appt to 05/25/12 at 9 am. Nothing further was needed

## 2012-05-03 NOTE — Telephone Encounter (Signed)
[id number] x1 

## 2012-05-05 ENCOUNTER — Institutional Professional Consult (permissible substitution): Payer: Medicare Other | Admitting: Pulmonary Disease

## 2012-05-25 ENCOUNTER — Ambulatory Visit (INDEPENDENT_AMBULATORY_CARE_PROVIDER_SITE_OTHER): Payer: Medicare Other | Admitting: Pulmonary Disease

## 2012-05-25 ENCOUNTER — Encounter: Payer: Self-pay | Admitting: Pulmonary Disease

## 2012-05-25 VITALS — BP 132/84 | HR 60 | Temp 97.0°F | Ht 70.0 in | Wt 121.0 lb

## 2012-05-25 DIAGNOSIS — R0989 Other specified symptoms and signs involving the circulatory and respiratory systems: Secondary | ICD-10-CM

## 2012-05-25 NOTE — Progress Notes (Signed)
  Subjective:    Patient ID: Alec Walker, male    DOB: 1927-07-22, 78 y.o.   MRN: [id number]  HPI The patient is an 78 year old male who I had been asked to see for dyspnea on exertion.  He has a long history of tobacco abuse, and therefore has been labeled as having COPD.  The patient tells me he has not had pulmonary function studies.  He apparently has had a recent chest x-ray that is unavailable to me.  The patient states he has had breathing issues for 5 years, and feels they are progressive.  He describes a less than one block dyspnea on exertion at a moderate pace on flat ground, and will get winded bringing groceries in from the car or walking up one flight of stairs.  Cough is not a major issue for him, but he has an intermittent dry cough at times.  He is currently on symbicort and Spiriva, and doesn't feel the inhalers have helped his breathing much.  He also wears oxygen sleeping.  He describes intermittent lower extremity edema.  He has had a recent stress test, with no significant ischemia is being seen.  He had an echocardiogram last week, with the results unavailable to me currently.   Review of Systems  Constitutional: Negative for fever and unexpected weight change.  HENT: Positive for congestion, rhinorrhea, postnasal drip and sinus pressure. Negative for ear pain, nosebleeds, sore throat, sneezing, trouble swallowing and dental problem.   Eyes: Negative for redness and itching.  Respiratory: Positive for cough, chest tightness, shortness of breath and wheezing.   Cardiovascular: Positive for leg swelling. Negative for palpitations.  Gastrointestinal: Negative for nausea and vomiting.  Genitourinary: Negative for dysuria.  Musculoskeletal: Positive for joint swelling.  Skin: Negative for rash.  Neurological: Negative for headaches.  Hematological: Bruises/bleeds easily.  Psychiatric/Behavioral: Positive for dysphoric mood. The patient is nervous/anxious.          Objective:   Physical Exam Constitutional:  thin, no acute distress  HENT:  Nares patent without discharge  Oropharynx without exudate, palate and uvula are normal  Eyes:  Perrla, eomi, no scleral icterus  Neck:  No JVD, no TMG  Cardiovascular:  Normal rate, regular rhythm, no rubs or gallops.  No murmurs        Intact distal pulses  Pulmonary :  Normal breath sounds, no stridor or respiratory distress   No rhonchi, or wheezing, a few scattered crackles heard.   Abdominal:  Soft, nondistended, bowel sounds present.  No tenderness noted.   Musculoskeletal:  Mild ankle edema noted.  Lymph Nodes:  No cervical lymphadenopathy noted  Skin:  No cyanosis noted  Neurologic:  Alert, appropriate, moves all 4 extremities without obvious deficit.         Assessment & Plan:

## 2012-05-25 NOTE — Patient Instructions (Addendum)
Stay on spiriva once a day, as well as symbicort in am and pm Stop combivent, but continue albuterol for rescue. Work on conditioning to get stronger. Will schedule for breathing studies, and will arrange followup once these are done.

## 2012-05-25 NOTE — Assessment & Plan Note (Signed)
The pt has significant doe that is interfering with his ADL's.  He has a h/o tobacco abuse, and therefore may have obstructive lung disease.  He has not had PFT's by his history, and therefore these will need to be done.  He is already on a very good BD regimen, and there may be nothing to add except pulmonary rehab.  Will need to make sure no other underlying contributors to his dyspnea such as cardiac disease.

## 2012-06-01 ENCOUNTER — Encounter: Payer: Self-pay | Admitting: Pulmonary Disease

## 2012-09-04 ENCOUNTER — Observation Stay (HOSPITAL_COMMUNITY)
Admission: EM | Admit: 2012-09-04 | Discharge: 2012-09-05 | Disposition: A | Discharge status: Home / Self Care | Payer: Medicare Other | Attending: Internal Medicine | Admitting: Internal Medicine

## 2012-09-04 ENCOUNTER — Emergency Department (HOSPITAL_COMMUNITY): Payer: Medicare Other

## 2012-09-04 DIAGNOSIS — A3211 Listerial meningitis: Secondary | ICD-10-CM

## 2012-09-04 DIAGNOSIS — N039 Chronic nephritic syndrome with unspecified morphologic changes: Secondary | ICD-10-CM | POA: Insufficient documentation

## 2012-09-04 DIAGNOSIS — Z7982 Long term (current) use of aspirin: Secondary | ICD-10-CM | POA: Insufficient documentation

## 2012-09-04 DIAGNOSIS — Z7902 Long term (current) use of antithrombotics/antiplatelets: Secondary | ICD-10-CM | POA: Insufficient documentation

## 2012-09-04 DIAGNOSIS — D631 Anemia in chronic kidney disease: Secondary | ICD-10-CM | POA: Insufficient documentation

## 2012-09-04 DIAGNOSIS — I129 Hypertensive chronic kidney disease with stage 1 through stage 4 chronic kidney disease, or unspecified chronic kidney disease: Secondary | ICD-10-CM | POA: Insufficient documentation

## 2012-09-04 DIAGNOSIS — J189 Pneumonia, unspecified organism: Secondary | ICD-10-CM

## 2012-09-04 DIAGNOSIS — I2510 Atherosclerotic heart disease of native coronary artery without angina pectoris: Secondary | ICD-10-CM | POA: Insufficient documentation

## 2012-09-04 DIAGNOSIS — F329 Major depressive disorder, single episode, unspecified: Secondary | ICD-10-CM | POA: Insufficient documentation

## 2012-09-04 DIAGNOSIS — I10 Essential (primary) hypertension: Secondary | ICD-10-CM | POA: Diagnosis present

## 2012-09-04 DIAGNOSIS — N182 Chronic kidney disease, stage 2 (mild): Secondary | ICD-10-CM | POA: Insufficient documentation

## 2012-09-04 DIAGNOSIS — R531 Weakness: Secondary | ICD-10-CM

## 2012-09-04 DIAGNOSIS — R404 Transient alteration of awareness: Secondary | ICD-10-CM | POA: Insufficient documentation

## 2012-09-04 DIAGNOSIS — F3289 Other specified depressive episodes: Secondary | ICD-10-CM | POA: Insufficient documentation

## 2012-09-04 DIAGNOSIS — E162 Hypoglycemia, unspecified: Principal | ICD-10-CM | POA: Insufficient documentation

## 2012-09-04 DIAGNOSIS — Z9981 Dependence on supplemental oxygen: Secondary | ICD-10-CM | POA: Insufficient documentation

## 2012-09-04 DIAGNOSIS — J449 Chronic obstructive pulmonary disease, unspecified: Secondary | ICD-10-CM

## 2012-09-04 DIAGNOSIS — Z79899 Other long term (current) drug therapy: Secondary | ICD-10-CM | POA: Insufficient documentation

## 2012-09-04 DIAGNOSIS — M6281 Muscle weakness (generalized): Secondary | ICD-10-CM | POA: Insufficient documentation

## 2012-09-04 DIAGNOSIS — E46 Unspecified protein-calorie malnutrition: Secondary | ICD-10-CM

## 2012-09-04 DIAGNOSIS — E785 Hyperlipidemia, unspecified: Secondary | ICD-10-CM | POA: Insufficient documentation

## 2012-09-04 DIAGNOSIS — N179 Acute kidney failure, unspecified: Secondary | ICD-10-CM | POA: Insufficient documentation

## 2012-09-04 DIAGNOSIS — J4489 Other specified chronic obstructive pulmonary disease: Secondary | ICD-10-CM | POA: Insufficient documentation

## 2012-09-04 LAB — URINALYSIS, ROUTINE W REFLEX MICROSCOPIC
Glucose, UA: NEGATIVE mg/dL
Hgb urine dipstick: NEGATIVE
Ketones, ur: NEGATIVE mg/dL
Protein, ur: NEGATIVE mg/dL

## 2012-09-04 LAB — COMPREHENSIVE METABOLIC PANEL
AST: 12 U/L (ref 0–37)
Albumin: 3.3 g/dL — ABNORMAL LOW (ref 3.5–5.2)
Alkaline Phosphatase: 64 U/L (ref 39–117)
BUN: 22 mg/dL (ref 6–23)
Chloride: 114 mEq/L — ABNORMAL HIGH (ref 96–112)
Potassium: 3.9 mEq/L (ref 3.5–5.1)
Total Bilirubin: 0.1 mg/dL — ABNORMAL LOW (ref 0.3–1.2)

## 2012-09-04 LAB — CBC WITH DIFFERENTIAL/PLATELET
Basophils Relative: 1 % (ref 0–1)
Hemoglobin: 11.5 g/dL — ABNORMAL LOW (ref 13.0–17.0)
MCHC: 32.6 g/dL (ref 30.0–36.0)
Monocytes Relative: 10 % (ref 3–12)
Neutro Abs: 4.2 10*3/uL (ref 1.7–7.7)
Neutrophils Relative %: 54 % (ref 43–77)
RBC: 3.74 MIL/uL — ABNORMAL LOW (ref 4.22–5.81)

## 2012-09-04 LAB — POCT I-STAT TROPONIN I

## 2012-09-04 LAB — GLUCOSE, CAPILLARY: Glucose-Capillary: 114 mg/dL — ABNORMAL HIGH (ref 70–99)

## 2012-09-04 MED ORDER — SODIUM CHLORIDE 0.9 % IV SOLN
Freq: Once | INTRAVENOUS | Status: AC
  Administered 2012-09-04: 21:00:00 via INTRAVENOUS

## 2012-09-04 NOTE — ED Provider Notes (Signed)
History     CSN: [id number]  Arrival date & time 09/04/12  1842   First MD Initiated Contact with Patient 09/04/12 2021      Chief Complaint  Patient presents with  . Weakness    (Consider location/radiation/quality/duration/timing/severity/associated sxs/prior treatment) Patient is a 79 y.o. male presenting with weakness. The history is provided by the patient and medical records. No language interpreter was used.  Weakness The symptoms began yesterday (Patient says he has been weak since yesterday. He does not feel good. He is unable to be more specific about what is wrong. First responders thought that he had left-sided weakness.). The symptoms are unchanged. The neurological symptoms are diffuse. Context:  no speecciiffiic cauusse.  Additional symptoms include weakness. Associated medical issues comments: Prior Hx COPD.  Marland Kitchen    Past Medical History  Diagnosis Date  . COPD (chronic obstructive pulmonary disease)   . CAD (coronary artery disease)   . HTN (hypertension)   . PUD (peptic ulcer disease)   . Syncope 07/25/11    x 2 today    Past Surgical History  Procedure Date  . Cataract extraction   . Gastrectomy   . Transurethral resection of prostate   . Coronary angioplasty with stent placement     stent in 2003    Family History  Problem Relation Age of Onset  . Hypertension    . Coronary artery disease    . Stroke Mother     History  Substance Use Topics  . Smoking status: Former Smoker -- 1.0 packs/day for 60 years    Types: Cigarettes    Quit date: 01/06/1997  . Smokeless tobacco: Never Used  . Alcohol Use: No     Comment: hx etoh abuse in past, daughter states quit over 20 years ago      Review of Systems  Constitutional:       Generalized weakness.  HENT: Negative.   Eyes:       Has known glaucoma.  Respiratory: Negative.   Cardiovascular: Negative.   Gastrointestinal: Negative.   Genitourinary: Negative.   Musculoskeletal: Negative.   Skin:  Negative.   Neurological: Positive for weakness.  Psychiatric/Behavioral: Negative.     Allergies  Review of patient's allergies indicates no known allergies.  Home Medications   Current Outpatient Rx  Name  Route  Sig  Dispense  Refill  . ASPIRIN 81 MG PO CHEW   Oral   Chew 81 mg by mouth daily.         Marland Kitchen BRINZOLAMIDE-BRIMONIDINE 1-0.2 % OP SUSP   Both Eyes   Place 1 drop into both eyes 2 (two) times daily.          Marland Kitchen VITAMIN D 2000 UNITS PO TABS   Oral   Take 2,000 Units by mouth daily.         Marland Kitchen CLOPIDOGREL BISULFATE 75 MG PO TABS   Oral   Take 75 mg by mouth daily.          Marland Kitchen METOPROLOL TARTRATE 25 MG PO TABS   Oral   Take 1 tablet (25 mg total) by mouth 2 (two) times daily.   20 tablet   0   . MIRTAZAPINE 30 MG PO TABS   Oral   Take 30 mg by mouth daily.          Marland Kitchen OMEPRAZOLE 40 MG PO CPDR   Oral   Take 40 mg by mouth daily.          Marland Kitchen  PRAVASTATIN SODIUM 40 MG PO TABS   Oral   Take 40 mg by mouth every evening.          Marland Kitchen TEMAZEPAM 30 MG PO CAPS   Oral   Take 30 mg by mouth at bedtime as needed. For sleep         . TIOTROPIUM BROMIDE MONOHYDRATE 18 MCG IN CAPS   Inhalation   Place 18 mcg into inhaler and inhale 2 (two) times daily.          . TRAVOPROST (BAK FREE) 0.004 % OP SOLN   Both Eyes   Place 1 drop into both eyes at bedtime.            BP 169/70  Pulse 59  Temp 97.8 F (36.6 C) (Oral)  Resp 12  SpO2 99%  Physical Exam  Nursing note and vitals reviewed. Constitutional: He is oriented to person, place, and time.       Slender elderly man, answers most questions.   HENT:  Head: Normocephalic and atraumatic.  Right Ear: External ear normal.  Left Ear: External ear normal.  Mouth/Throat: Oropharynx is clear and moist.  Eyes: EOM are normal. Pupils are equal, round, and reactive to light.  Neck: Normal range of motion. Neck supple.  Cardiovascular: Normal rate, regular rhythm and normal heart sounds.     Pulmonary/Chest: Effort normal and breath sounds normal.  Abdominal: Soft. Bowel sounds are normal. He exhibits no distension. There is no tenderness. There is no rebound and no guarding.  Musculoskeletal: Normal range of motion. He exhibits no edema and no tenderness.  Neurological: He is alert and oriented to person, place, and time.       No apparent sensory or motor deficit.  Skin: Skin is warm and dry.  Psychiatric: He has a normal mood and affect. His behavior is normal.    ED Course  Procedures (including critical care time)  Labs Reviewed  CBC WITH DIFFERENTIAL - Abnormal; Notable for the following:    RBC 3.74 (*)     Hemoglobin 11.5 (*)     HCT 35.3 (*)     RDW 15.6 (*)     Eosinophils Relative 8 (*)     All other components within normal limits  COMPREHENSIVE METABOLIC PANEL - Abnormal; Notable for the following:    Chloride 114 (*)     CO2 18 (*)     Glucose, Bld 48 (*)     Creatinine, Ser 1.64 (*)     Albumin 3.3 (*)     Total Bilirubin 0.1 (*)     GFR calc non Af Amer 37 (*)     GFR calc Af Amer 43 (*)     All other components within normal limits   9:03 PM Glucose was 48.  Please feed pt. Pt seen --> physical exam performed.  Lab workup ordered.   Date: 09/05/2012  Rate:66  Rhythm: normal sinus rhythm  QRS Axis: normal  Intervals: PR prolonged QRS:  Poor R wave progression in precordial leads suggest possible old anterior myocardial infarction.  ST/T Wave abnormalities: ST depressions inferiorly and ST depressions laterally  Conduction Disutrbances:first-degree A-V block   Narrative Interpretation: Abnormal EKG  Old EKG Reviewed: changes noted--pt had frequent PVC's and Mobitz II AV block on tracing of 07/25/2011.  1:21 AM Results for orders placed during the hospital encounter of 09/04/12  CBC WITH DIFFERENTIAL      Component Value Range   WBC 7.8  4.0 -  10.5 K/uL   RBC 3.74 (*) 4.22 - 5.81 MIL/uL   Hemoglobin 11.5 (*) 13.0 - 17.0 g/dL   HCT 16.1  (*) 09.6 - 52.0 %   MCV 94.4  78.0 - 100.0 fL   MCH 30.7  26.0 - 34.0 pg   MCHC 32.6  30.0 - 36.0 g/dL   RDW 04.5 (*) 40.9 - 81.1 %   Platelets 283  150 - 400 K/uL   Neutrophils Relative 54  43 - 77 %   Neutro Abs 4.2  1.7 - 7.7 K/uL   Lymphocytes Relative 27  12 - 46 %   Lymphs Abs 2.1  0.7 - 4.0 K/uL   Monocytes Relative 10  3 - 12 %   Monocytes Absolute 0.8  0.1 - 1.0 K/uL   Eosinophils Relative 8 (*) 0 - 5 %   Eosinophils Absolute 0.6  0.0 - 0.7 K/uL   Basophils Relative 1  0 - 1 %   Basophils Absolute 0.0  0.0 - 0.1 K/uL  COMPREHENSIVE METABOLIC PANEL      Component Value Range   Sodium 143  135 - 145 mEq/L   Potassium 3.9  3.5 - 5.1 mEq/L   Chloride 114 (*) 96 - 112 mEq/L   CO2 18 (*) 19 - 32 mEq/L   Glucose, Bld 48 (*) 70 - 99 mg/dL   BUN 22  6 - 23 mg/dL   Creatinine, Ser 9.14 (*) 0.50 - 1.35 mg/dL   Calcium 9.3  8.4 - 78.2 mg/dL   Total Protein 6.5  6.0 - 8.3 g/dL   Albumin 3.3 (*) 3.5 - 5.2 g/dL   AST 12  0 - 37 U/L   ALT 7  0 - 53 U/L   Alkaline Phosphatase 64  39 - 117 U/L   Total Bilirubin 0.1 (*) 0.3 - 1.2 mg/dL   GFR calc non Af Amer 37 (*) >90 mL/min   GFR calc Af Amer 43 (*) >90 mL/min  URINALYSIS, ROUTINE W REFLEX MICROSCOPIC      Component Value Range   Color, Urine YELLOW  YELLOW   APPearance CLEAR  CLEAR   Specific Gravity, Urine 1.025  1.005 - 1.030   pH 5.0  5.0 - 8.0   Glucose, UA NEGATIVE  NEGATIVE mg/dL   Hgb urine dipstick NEGATIVE  NEGATIVE   Bilirubin Urine SMALL (*) NEGATIVE   Ketones, ur NEGATIVE  NEGATIVE mg/dL   Protein, ur NEGATIVE  NEGATIVE mg/dL   Urobilinogen, UA 0.2  0.0 - 1.0 mg/dL   Nitrite NEGATIVE  NEGATIVE   Leukocytes, UA NEGATIVE  NEGATIVE  PROTIME-INR      Component Value Range   Prothrombin Time 13.3  11.6 - 15.2 seconds   INR 1.02  0.00 - 1.49  APTT      Component Value Range   aPTT 37  24 - 37 seconds  POCT I-STAT TROPONIN I      Component Value Range   Troponin i, poc 0.00  0.00 - 0.08 ng/mL   Comment 3            GLUCOSE, CAPILLARY      Component Value Range   Glucose-Capillary 114 (*) 70 - 99 mg/dL  GLUCOSE, CAPILLARY      Component Value Range   Glucose-Capillary 140 (*) 70 - 99 mg/dL   Dg Chest 2 View  9/56/2130  *RADIOLOGY REPORT*  Clinical Data: 79 year old male with left-sided weakness.  CHEST - 2 VIEW  Comparison: 09/09/2011 and prior  chest radiographs  Findings: The cardiomediastinal silhouette is unremarkable. COPD/emphysema again noted. There is no evidence of focal airspace disease, pulmonary edema, suspicious pulmonary nodule/mass, pleural effusion, or pneumothorax. No acute bony abnormalities are identified.  IMPRESSION: COPD/emphysema without evidence of acute cardiopulmonary disease.   Original Report Authenticated By: Harmon Pier, M.D.    Ct Head Wo Contrast  09/04/2012  *RADIOLOGY REPORT*  Clinical Data: Weakness since yesterday.  Left-sided weakness.  CT HEAD WITHOUT CONTRAST  Technique:  Contiguous axial images were obtained from the base of the skull through the vertex without contrast.  Comparison: 09/09/2011  Findings: Diffuse cerebral atrophy.  Ventricular dilatation consistent with central atrophy.  Low attenuation changes in the deep white matter consistent with small vessel ischemia.  No mass effect or midline shift.  No abnormal extra-axial fluid collections.  The gray-white matter junctions are distinct.  The basal cisterns are not effaced.  No depressed skull fractures. Visualized paranasal sinuses and mastoid air cells are not opacified.  Vascular calcifications.  No significant changes since the previous study.  IMPRESSION: No acute intracranial abnormalities.  Chronic atrophy and small vessel ischemic changes.   Original Report Authenticated By: Burman Nieves, M.D.    1:22 AM Repeat blood glucose is 140.  Pt more alert.  Discussed his condition with him and with his daughter.  Advised observation, IV rehydration.  Differential diagnoses include malnutrition, COPD, post  syncope.  1. Weakness   2. Malnutrition   3. COPD (chronic obstructive pulmonary disease)      1:42 AM Case discussed with Dr. Onalee Hua --> admit to med surg bed to observation status to [facility].       Carleene Cooper III, MD 09/05/12 [phone]

## 2012-09-04 NOTE — ED Notes (Signed)
Pt developed weakness yesterday.  When first responders arrived they state pt had L side deficits and L facial droop.  Now resolved.  Pt states he has hx of TIA, but nothing in chart history confirms.  Pt now c/o generalized weakness.

## 2012-09-04 NOTE — ED Notes (Signed)
Daughter at bedside. Daughter reports coming to visit her Dad today and he was slow to respond and "not himself."  Daughter reports that his speech "doesn't seem clear to me, and that doesn't seem normal. It's been like that since yesterday."

## 2012-09-04 NOTE — ED Notes (Signed)
Daughter states his speech is much better and back to normal

## 2012-09-05 ENCOUNTER — Encounter (HOSPITAL_COMMUNITY): Payer: Self-pay | Admitting: *Deleted

## 2012-09-05 DIAGNOSIS — R5381 Other malaise: Secondary | ICD-10-CM

## 2012-09-05 DIAGNOSIS — E46 Unspecified protein-calorie malnutrition: Secondary | ICD-10-CM

## 2012-09-05 DIAGNOSIS — I10 Essential (primary) hypertension: Secondary | ICD-10-CM

## 2012-09-05 DIAGNOSIS — I2510 Atherosclerotic heart disease of native coronary artery without angina pectoris: Secondary | ICD-10-CM

## 2012-09-05 DIAGNOSIS — Z9981 Dependence on supplemental oxygen: Secondary | ICD-10-CM

## 2012-09-05 DIAGNOSIS — J4489 Other specified chronic obstructive pulmonary disease: Secondary | ICD-10-CM

## 2012-09-05 DIAGNOSIS — E162 Hypoglycemia, unspecified: Principal | ICD-10-CM

## 2012-09-05 DIAGNOSIS — R531 Weakness: Secondary | ICD-10-CM | POA: Diagnosis present

## 2012-09-05 DIAGNOSIS — J449 Chronic obstructive pulmonary disease, unspecified: Secondary | ICD-10-CM

## 2012-09-05 LAB — BASIC METABOLIC PANEL
BUN: 20 mg/dL (ref 6–23)
CO2: 19 mEq/L (ref 19–32)
Calcium: 8.8 mg/dL (ref 8.4–10.5)
Glucose, Bld: 88 mg/dL (ref 70–99)
Sodium: 144 mEq/L (ref 135–145)

## 2012-09-05 LAB — CBC
MCH: 30.1 pg (ref 26.0–34.0)
MCHC: 32.4 g/dL (ref 30.0–36.0)
Platelets: 218 10*3/uL (ref 150–400)
RBC: 3.29 MIL/uL — ABNORMAL LOW (ref 4.22–5.81)
RDW: 15.4 % (ref 11.5–15.5)

## 2012-09-05 LAB — GLUCOSE, CAPILLARY
Glucose-Capillary: 83 mg/dL (ref 70–99)
Glucose-Capillary: 107 mg/dL — ABNORMAL HIGH (ref 70–99)
Glucose-Capillary: 113 mg/dL — ABNORMAL HIGH (ref 70–99)

## 2012-09-05 MED ORDER — ASPIRIN 81 MG PO CHEW: 81.0000 mg | CHEWABLE_TABLET | Freq: Every day | ORAL | Status: DC

## 2012-09-05 MED ORDER — ENOXAPARIN SODIUM 30 MG/0.3ML ~~LOC~~ SOLN
30.0000 mg | SUBCUTANEOUS | Status: DC
  Administered 2012-09-05: 30 mg via SUBCUTANEOUS
  Filled 2012-09-05 (×2): qty 0.3

## 2012-09-05 MED ORDER — CLOPIDOGREL BISULFATE 75 MG PO TABS
75.0000 mg | ORAL_TABLET | Freq: Every day | ORAL | Status: DC
  Filled 2012-09-05: qty 1

## 2012-09-05 MED ORDER — TIOTROPIUM BROMIDE MONOHYDRATE 18 MCG IN CAPS
18.0000 ug | ORAL_CAPSULE | Freq: Two times a day (BID) | RESPIRATORY_TRACT | Status: DC
  Administered 2012-09-05: 18 ug via RESPIRATORY_TRACT
  Filled 2012-09-05: qty 5

## 2012-09-05 MED ORDER — SODIUM CHLORIDE 0.9 % IV SOLN: INTRAVENOUS | Status: DC

## 2012-09-05 MED ORDER — SODIUM CHLORIDE 0.9 % IV SOLN
INTRAVENOUS | Status: AC
  Administered 2012-09-05: 04:00:00 via INTRAVENOUS

## 2012-09-05 MED ORDER — UNABLE TO FIND: Status: DC

## 2012-09-05 NOTE — ED Notes (Signed)
Dr Ignacia Palma in to talk with patient and daughter regarding admission

## 2012-09-05 NOTE — Progress Notes (Signed)
NCM spoke to pt and gave permission to speak with his dtr, [name] [phone]. Dtr states she is POA, and their mother is using [facility]. Requested their services for her father. States he has DME at home and no needed at this time. Faxed referral to [facility]. Will follow up on 1/20 to see if referral was received. Isidoro Donning RN CCM Case Mgmt phone [phone]

## 2012-09-05 NOTE — H&P (Signed)
PCP:   Eartha Inch, MD   Chief Complaint:  weakness  HPI: 79 yo male with brief episode of generalized weakness earlier this am ems was called and was unsure if pt was having left sided weakness or general weakness or not.  By the time to ED his neuro exam was normal by edp.  However his glucose was initially 48.  Pt lives with his wife and his dtr is with him right now.  He has had no recent illnesses.  No fevers.  No n/v/d.  Eats ok but dtr says he eats poorly all the time.  He is not a diabetic and neither is his wife, they both are not on any diabetic meds.  He denies any sluured speech right now or any n/t/or weakness in any extremeties.  No pain.  No cp.  No sob.  No dysuria.  Has copd and is on oxygen only at night prn.  Asked to obs pt for his low glucose level.  This has improved with him eating food.  Review of Systems:  O/w neg  Past Medical History: Past Medical History  Diagnosis Date  . COPD (chronic obstructive pulmonary disease)   . CAD (coronary artery disease)   . HTN (hypertension)   . PUD (peptic ulcer disease)   . Syncope 07/25/11    x 2 today   Past Surgical History  Procedure Date  . Cataract extraction   . Gastrectomy   . Transurethral resection of prostate   . Coronary angioplasty with stent placement     stent in 2003    Medications: Prior to Admission medications   Medication Sig Start Date End Date Taking? Authorizing Provider  aspirin 81 MG chewable tablet Chew 81 mg by mouth daily.   Yes Historical Provider, MD  Brinzolamide-Brimonidine [facility]) 1-0.2 % SUSP Place 1 drop into both eyes 2 (two) times daily.    Yes Historical Provider, MD  Cholecalciferol (VITAMIN D) 2000 UNITS tablet Take 2,000 Units by mouth daily.   Yes Historical Provider, MD  clopidogrel (PLAVIX) 75 MG tablet Take 75 mg by mouth daily.  05/10/12  Yes Historical Provider, MD  metoprolol tartrate (LOPRESSOR) 25 MG tablet Take 1 tablet (25 mg total) by mouth 2 (two) times  daily. 08/06/11 10/04/12 Yes Leroy Sea, MD  mirtazapine (REMERON) 30 MG tablet Take 30 mg by mouth daily.  05/13/12  Yes Historical Provider, MD  omeprazole (PRILOSEC) 40 MG capsule Take 40 mg by mouth daily.  05/19/12  Yes Historical Provider, MD  pravastatin (PRAVACHOL) 40 MG tablet Take 40 mg by mouth every evening.    Yes Historical Provider, MD  temazepam (RESTORIL) 30 MG capsule Take 30 mg by mouth at bedtime as needed. For sleep   Yes Historical Provider, MD  tiotropium (SPIRIVA) 18 MCG inhalation capsule Place 18 mcg into inhaler and inhale 2 (two) times daily.    Yes Historical Provider, MD  Travoprost, BAK Free, (TRAVATAN) 0.004 % SOLN ophthalmic solution Place 1 drop into both eyes at bedtime.    Yes Historical Provider, MD    Allergies:  No Known Allergies  Social History:  reports that he quit smoking about 15 years ago. His smoking use included Cigarettes. He has a 60 pack-year smoking history. He has never used smokeless tobacco. He reports that he does not drink alcohol or use illicit drugs.  Family History: Family History  Problem Relation Age of Onset  . Hypertension    . Coronary artery disease    .  Stroke Mother     Physical Exam: Filed Vitals:   09/04/12 2345 09/05/12 0000 09/05/12 0015 09/05/12 0033  BP: 139/78 123/90 73/56 140/68  Pulse: 63 69 58 69  Temp:      TempSrc:      Resp: 20 12 29 17   SpO2: 100% 100% 100% 100%   General appearance: alert, cooperative and no distress Neck: no JVD and supple, symmetrical, trachea midline Lungs: clear to auscultation bilaterally Heart: regular rate and rhythm, S1, S2 normal, no murmur, click, rub or gallop Abdomen: soft, non-tender; bowel sounds normal; no masses,  no organomegaly Extremities: extremities normal, atraumatic, no cyanosis or edema Pulses: 2+ and symmetric Skin: Skin color, texture, turgor normal. No rashes or lesions Neurologic: Grossly normal    Labs on Admission:   [facility] 09/04/12 1911   NA 143  K 3.9  CL 114*  CO2 18*  GLUCOSE 48*  BUN 22  CREATININE 1.64*  CALCIUM 9.3  MG --  PHOS --    Basename 09/04/12 1911  AST 12  ALT 7  ALKPHOS 64  BILITOT 0.1*  PROT 6.5  ALBUMIN 3.3*    Basename 09/04/12 1911  WBC 7.8  NEUTROABS 4.2  HGB 11.5*  HCT 35.3*  MCV 94.4  PLT 283    Radiological Exams on Admission: Dg Chest 2 View  09/04/2012  *RADIOLOGY REPORT*  Clinical Data: 79 year old male with left-sided weakness.  CHEST - 2 VIEW  Comparison: 09/09/2011 and prior chest radiographs  Findings: The cardiomediastinal silhouette is unremarkable. COPD/emphysema again noted. There is no evidence of focal airspace disease, pulmonary edema, suspicious pulmonary nodule/mass, pleural effusion, or pneumothorax. No acute bony abnormalities are identified.  IMPRESSION: COPD/emphysema without evidence of acute cardiopulmonary disease.   Original Report Authenticated By: Harmon Pier, M.D.    Ct Head Wo Contrast  09/04/2012  *RADIOLOGY REPORT*  Clinical Data: Weakness since yesterday.  Left-sided weakness.  CT HEAD WITHOUT CONTRAST  Technique:  Contiguous axial images were obtained from the base of the skull through the vertex without contrast.  Comparison: 09/09/2011  Findings: Diffuse cerebral atrophy.  Ventricular dilatation consistent with central atrophy.  Low attenuation changes in the deep white matter consistent with small vessel ischemia.  No mass effect or midline shift.  No abnormal extra-axial fluid collections.  The gray-white matter junctions are distinct.  The basal cisterns are not effaced.  No depressed skull fractures. Visualized paranasal sinuses and mastoid air cells are not opacified.  Vascular calcifications.  No significant changes since the previous study.  IMPRESSION: No acute intracranial abnormalities.  Chronic atrophy and small vessel ischemic changes.   Original Report Authenticated By: Burman Nieves, M.D.     Assessment/Plan 79 yo male with generalized  weakness and hypoglycemia  Principal Problem:  *Hypoglycemia Active Problems:  COPD (chronic obstructive pulmonary disease)  CAD (coronary artery disease)  Supplemental oxygen dependent  HTN (hypertension)  Weakness generalized  cth neg.  ekg and labs unremarkable excpet some mild renal insuff and low glucose.  No source of infection.  Neuro cks overnight.  Cont his plavix and asa.  Ck glucose regularly and ck proinsulin level.  Ck orthostatics.  No focal neuro deficits will proceed with further neuro w/u if develops further symptoms.  Obs.  Consider d/c later on this afternoon, dtr lives in [place] and would like to be called to come pick him up when he is discharged either later today or tomorrow.    DAVID,RACHAL A 09/05/2012, 2:10 AM

## 2012-09-05 NOTE — Progress Notes (Signed)
[facility] PROGRESS NOTE  Alec Walker:[id number] DOB: July 30, 1927 DOA: 09/04/2012 PCP: Eartha Inch, MD  Assessment/Plan: Hypoglycemia Likely due to poor oral intake. Resolved with hydration and nutrition.  Acute delirium Likely due to hypoglycemia, resolved. Head CT negative, no focal neurologic deficiets. Infectious workup negative with negative chest x-ray and urine analysis.  Protein calorie malnutrition Discussed with patient and daughter about a general diet and to have patient drink supplemental nutrition like ensure/boost/breeze 2-3 times a day.  Acute renal failure on CKD Stage II Resolved with IV hydration. Likely due to poor oral intake.  Generalized weakness Request home health PT/OT.  Hypertension  Hold metoprolol. Stable and normotensive so far. Discuss with PCP about if he needs to restart this medication.  GERD Continue PPI after discharge.  CAD Continue ASA and plavix.  Anemia Drop in hemoglobin likely dilutional. Will need further evaluation after discharge after he follows up with his PCP.  Code Status: Full code Family Communication: Discussed with daughter on telephone [phone]. Disposition Plan: DC today.   Consultants:  None  Procedures:  None  Antibiotics:  None.  HPI/Subjective: No specific concerns except feeling weak.  Objective: Filed Vitals:   09/05/12 0700 09/05/12 0703 09/05/12 0705 09/05/12 0717  BP: 143/61 128/67 129/55   Pulse: 59 70 70   Temp: 98.1 F (36.7 C)     TempSrc: Axillary     Resp: 19 19 20    Height:      Weight:      SpO2: 99% 98% 96% 98%    Intake/Output Summary (Last 24 hours) at 09/05/12 1132 Last data filed at 09/04/12 2251  Gross per 24 hour  Intake   1000 ml  Output      0 ml  Net   1000 ml   Filed Weights   09/05/12 0327  Weight: 48.1 kg (106 lb 0.7 oz)    Exam: Physical Exam: General: Awake, Oriented, No acute distress. HEENT: EOMI. Neck: Supple CV: S1 and  S2 Lungs: Clear to ascultation bilaterally Abdomen: Soft, Nontender, Nondistended, +bowel sounds. Ext: Good pulses. Trace edema.  Data Reviewed: Basic Metabolic Panel:  Lab 09/05/12 8295 09/04/12 1911  NA 144 143  K 3.6 3.9  CL 117* 114*  CO2 19 18*  GLUCOSE 88 48*  BUN 20 22  CREATININE 1.26 1.64*  CALCIUM 8.8 9.3  MG -- --  PHOS -- --   Liver Function Tests:  Lab 09/04/12 1911  AST 12  ALT 7  ALKPHOS 64  BILITOT 0.1*  PROT 6.5  ALBUMIN 3.3*   No results found for this basename: LIPASE:5,AMYLASE:5 in the last 168 hours No results found for this basename: AMMONIA:5 in the last 168 hours CBC:  Lab 09/05/12 0545 09/04/12 1911  WBC 5.1 7.8  NEUTROABS -- 4.2  HGB 9.9* 11.5*  HCT 30.6* 35.3*  MCV 93.0 94.4  PLT 218 283   Cardiac Enzymes: No results found for this basename: CKTOTAL:5,CKMB:5,CKMBINDEX:5,TROPONINI:5 in the last 168 hours BNP (last 3 results) No results found for this basename: PROBNP:3 in the last 8760 hours CBG:  Lab 09/05/12 0413 09/05/12 0019 09/04/12 2249  GLUCAP 89 140* 114*    No results found for this or any previous visit (from the past 240 hour(s)).   Studies: Dg Chest 2 View  09/04/2012  *RADIOLOGY REPORT*  Clinical Data: 79 year old male with left-sided weakness.  CHEST - 2 VIEW  Comparison: 09/09/2011 and prior chest radiographs  Findings: The cardiomediastinal silhouette is unremarkable. COPD/emphysema again noted.  There is no evidence of focal airspace disease, pulmonary edema, suspicious pulmonary nodule/mass, pleural effusion, or pneumothorax. No acute bony abnormalities are identified.  IMPRESSION: COPD/emphysema without evidence of acute cardiopulmonary disease.   Original Report Authenticated By: Harmon Pier, M.D.    Ct Head Wo Contrast  09/04/2012  *RADIOLOGY REPORT*  Clinical Data: Weakness since yesterday.  Left-sided weakness.  CT HEAD WITHOUT CONTRAST  Technique:  Contiguous axial images were obtained from the base of the  skull through the vertex without contrast.  Comparison: 09/09/2011  Findings: Diffuse cerebral atrophy.  Ventricular dilatation consistent with central atrophy.  Low attenuation changes in the deep white matter consistent with small vessel ischemia.  No mass effect or midline shift.  No abnormal extra-axial fluid collections.  The gray-white matter junctions are distinct.  The basal cisterns are not effaced.  No depressed skull fractures. Visualized paranasal sinuses and mastoid air cells are not opacified.  Vascular calcifications.  No significant changes since the previous study.  IMPRESSION: No acute intracranial abnormalities.  Chronic atrophy and small vessel ischemic changes.   Original Report Authenticated By: Burman Nieves, M.D.     Scheduled Meds:   . sodium chloride   Intravenous STAT  . aspirin  81 mg Oral Daily  . clopidogrel  75 mg Oral Daily  . enoxaparin (LOVENOX) injection  30 mg Subcutaneous Q24H  . tiotropium  18 mcg Inhalation BID   Continuous Infusions:   . sodium chloride 75 mL/hr at 09/05/12 0404    Principal Problem:  *Hypoglycemia Active Problems:  COPD (chronic obstructive pulmonary disease)  CAD (coronary artery disease)  Supplemental oxygen dependent  HTN (hypertension)  Weakness generalized   Ahniya Mitchum A  [facility] Pager [phone]. If 7PM-7AM, please contact night-coverage at [email or web address], password [facility] 09/05/2012, 11:32 AM  LOS: 1 day

## 2012-09-05 NOTE — Discharge Summary (Signed)
Physician Discharge Summary  Alec Walker JXB:[id number] DOB: 1927/06/19 DOA: 09/04/2012  PCP: Eartha Inch, MD  Admit date: 09/04/2012 Discharge date: 09/05/2012  Time spent: 25 minutes  Recommendations for Outpatient Follow-up:  Please drink Boost/Ensure/Breeze or any other supplemental nutrition three times a day with enough fluids to hydrate yourself.  Please followup with Eartha Inch, MD (PCP) in 1 week. Please discuss with Dr. Cyndia Bent about nutrition and if metoprolol (blood pressure medication) needs to started which has been discontinued after discharge from the hospital. Also if anemic to work this up further as outpatient.  Home health PT/OT/RN/Social worker arranged.  Discharge Diagnoses:  Principal Problem:  *Hypoglycemia Active Problems:  COPD (chronic obstructive pulmonary disease)  CAD (coronary artery disease)  Supplemental oxygen dependent  HTN (hypertension)  Weakness generalized   Discharge Condition: Stable  Diet recommendation: General diet  Filed Weights   09/05/12 0327  Weight: 48.1 kg (106 lb 0.7 oz)    History of present illness:  On admission: "79 yo male with brief episode of generalized weakness earlier this am ems was called and was unsure if pt was having left sided weakness or general weakness or not. By the time to ED his neuro exam was normal by edp. However his glucose was initially 48. Pt lives with his wife and his dtr is with him right now. He has had no recent illnesses. No fevers. No n/v/d. Eats ok but dtr says he eats poorly all the time. He is not a diabetic and neither is his wife, they both are not on any diabetic meds. He denies any sluured speech right now or any n/t/or weakness in any extremeties. No pain. No cp. No sob. No dysuria. Has copd and is on oxygen only at night prn. Asked to obs pt for his low glucose level. This has improved with him eating food."  Hospital Course:  Hypoglycemia  Likely due to poor oral  intake. Resolved with hydration and nutrition.   Acute delirium  Likely due to hypoglycemia, resolved. Head CT negative, no focal neurologic deficiets. Infectious workup negative with negative chest x-ray and urine analysis.   Protein calorie malnutrition  Discussed with patient and daughter about a general diet and to have patient drink supplemental nutrition like ensure/boost/breeze 2-3 times a day. Discussed with daughter about starting the patient on Megace to help with appetite, daughter indicated that compliance with taking medications may be an issue with the patient as a result did not want to have this medication started at this time.  Acute renal failure on CKD Stage II  Resolved with IV hydration. Likely due to poor oral intake.   Generalized weakness  Request home health PT/OT.   Hypertension  Hold metoprolol. Stable and normotensive so far. Discuss with PCP about if he needs to restart this medication.   GERD  Continue PPI after discharge.   CAD  Continue ASA and plavix.   Anemia  Drop in hemoglobin likely dilutional. Will need further evaluation after discharge after he follows up with his PCP.  COPD Stable. Continue home inhalers and oxygen at night time use.  Depression Continue home medications.  Hyperlipidemia Continue statin after discharge.  Procedures:  None  Consultations:  None  Discharge Exam: Filed Vitals:   09/05/12 0700 09/05/12 0703 09/05/12 0705 09/05/12 0717  BP: 143/61 128/67 129/55   Pulse: 59 70 70   Temp: 98.1 F (36.7 C)     TempSrc: Axillary     Resp: 19 19 20  Height:      Weight:      SpO2: 99% 98% 96% 98%   Discharge Instructions  Discharge Orders    Future Orders Please Complete By Expires   Diet general      Increase activity slowly      Discharge instructions      Comments:   Please drink Boost/Ensure/Breeze or any other supplemental nutrition three times a day with enough fluids to hydrate yourself.  Please  followup with Eartha Inch, MD (PCP) in 1 week. Please discuss with Dr. Cyndia Bent about nutrition and if metoprolol (blood pressure medication) needs to started which has been discontinued after discharge from the hospital.  Home health PT/OT/RN/Social worker arranged.       Medication List     As of 09/05/2012 11:45 AM    STOP taking these medications         metoprolol tartrate 25 MG tablet   Commonly known as: LOPRESSOR      TAKE these medications         aspirin 81 MG chewable tablet   Chew 81 mg by mouth daily.      clopidogrel 75 MG tablet   Commonly known as: PLAVIX   Take 75 mg by mouth daily.      mirtazapine 30 MG tablet   Commonly known as: REMERON   Take 30 mg by mouth daily.      omeprazole 40 MG capsule   Commonly known as: PRILOSEC   Take 40 mg by mouth daily.      pravastatin 40 MG tablet   Commonly known as: PRAVACHOL   Take 40 mg by mouth every evening.      SIMBRINZA 1-0.2 % Susp   Generic drug: Brinzolamide-Brimonidine   Place 1 drop into both eyes 2 (two) times daily.      temazepam 30 MG capsule   Commonly known as: RESTORIL   Take 30 mg by mouth at bedtime as needed. For sleep      tiotropium 18 MCG inhalation capsule   Commonly known as: SPIRIVA   Place 18 mcg into inhaler and inhale 2 (two) times daily.      Travoprost (BAK Free) 0.004 % Soln ophthalmic solution   Commonly known as: TRAVATAN   Place 1 drop into both eyes at bedtime.      UNABLE TO FIND   Please drink Boost/Ensure/Breeze or any other supplemental nutrition three times a day with enough fluids to hydrate yourself.      Vitamin D 2000 UNITS tablet   Take 2,000 Units by mouth daily.          The results of significant diagnostics from this hospitalization (including imaging, microbiology, ancillary and laboratory) are listed below for reference.    Significant Diagnostic Studies: Dg Chest 2 View  09/04/2012  *RADIOLOGY REPORT*  Clinical Data: 79 year old male  with left-sided weakness.  CHEST - 2 VIEW  Comparison: 09/09/2011 and prior chest radiographs  Findings: The cardiomediastinal silhouette is unremarkable. COPD/emphysema again noted. There is no evidence of focal airspace disease, pulmonary edema, suspicious pulmonary nodule/mass, pleural effusion, or pneumothorax. No acute bony abnormalities are identified.  IMPRESSION: COPD/emphysema without evidence of acute cardiopulmonary disease.   Original Report Authenticated By: Harmon Pier, M.D.    Ct Head Wo Contrast  09/04/2012  *RADIOLOGY REPORT*  Clinical Data: Weakness since yesterday.  Left-sided weakness.  CT HEAD WITHOUT CONTRAST  Technique:  Contiguous axial images were obtained from the base of the skull through  the vertex without contrast.  Comparison: 09/09/2011  Findings: Diffuse cerebral atrophy.  Ventricular dilatation consistent with central atrophy.  Low attenuation changes in the deep white matter consistent with small vessel ischemia.  No mass effect or midline shift.  No abnormal extra-axial fluid collections.  The gray-white matter junctions are distinct.  The basal cisterns are not effaced.  No depressed skull fractures. Visualized paranasal sinuses and mastoid air cells are not opacified.  Vascular calcifications.  No significant changes since the previous study.  IMPRESSION: No acute intracranial abnormalities.  Chronic atrophy and small vessel ischemic changes.   Original Report Authenticated By: Burman Nieves, M.D.     Microbiology: No results found for this or any previous visit (from the past 240 hour(s)).   Labs: Basic Metabolic Panel:  Lab 09/05/12 5621 09/04/12 1911  NA 144 143  K 3.6 3.9  CL 117* 114*  CO2 19 18*  GLUCOSE 88 48*  BUN 20 22  CREATININE 1.26 1.64*  CALCIUM 8.8 9.3  MG -- --  PHOS -- --   Liver Function Tests:  Lab 09/04/12 1911  AST 12  ALT 7  ALKPHOS 64  BILITOT 0.1*  PROT 6.5  ALBUMIN 3.3*   No results found for this basename:  LIPASE:5,AMYLASE:5 in the last 168 hours No results found for this basename: AMMONIA:5 in the last 168 hours CBC:  Lab 09/05/12 0545 09/04/12 1911  WBC 5.1 7.8  NEUTROABS -- 4.2  HGB 9.9* 11.5*  HCT 30.6* 35.3*  MCV 93.0 94.4  PLT 218 283   Cardiac Enzymes: No results found for this basename: CKTOTAL:5,CKMB:5,CKMBINDEX:5,TROPONINI:5 in the last 168 hours BNP: BNP (last 3 results) No results found for this basename: PROBNP:3 in the last 8760 hours CBG:  Lab 09/05/12 0413 09/05/12 0019 09/04/12 2249  GLUCAP 89 140* 114*       Signed:  Elijah Michaelis A  [facility] 09/05/2012, 11:45 AM

## 2012-09-06 NOTE — Progress Notes (Signed)
NCM contacted [facility] and soc for HH is 1/21. Isidoro Donning RN CCM Case Mgmt phone [phone]

## 2012-09-09 LAB — PROINSULIN/INSULIN RATIO: Insulin: 8 u[IU]/mL (ref 3–19)

## 2012-09-22 ENCOUNTER — Encounter (HOSPITAL_COMMUNITY): Payer: Self-pay | Admitting: *Deleted

## 2012-09-22 ENCOUNTER — Emergency Department (HOSPITAL_COMMUNITY): Payer: Medicare Other

## 2012-09-22 ENCOUNTER — Emergency Department (HOSPITAL_COMMUNITY)
Admission: EM | Admit: 2012-09-22 | Discharge: 2012-09-22 | Disposition: A | Discharge status: Home / Self Care | Payer: Medicare Other | Attending: Emergency Medicine | Admitting: Emergency Medicine

## 2012-09-22 DIAGNOSIS — Y95 Nosocomial condition: Secondary | ICD-10-CM

## 2012-09-22 DIAGNOSIS — J189 Pneumonia, unspecified organism: Secondary | ICD-10-CM

## 2012-09-22 DIAGNOSIS — I10 Essential (primary) hypertension: Secondary | ICD-10-CM | POA: Insufficient documentation

## 2012-09-22 DIAGNOSIS — I2510 Atherosclerotic heart disease of native coronary artery without angina pectoris: Secondary | ICD-10-CM | POA: Insufficient documentation

## 2012-09-22 DIAGNOSIS — Z8711 Personal history of peptic ulcer disease: Secondary | ICD-10-CM | POA: Insufficient documentation

## 2012-09-22 DIAGNOSIS — Z79899 Other long term (current) drug therapy: Secondary | ICD-10-CM | POA: Insufficient documentation

## 2012-09-22 DIAGNOSIS — Z8669 Personal history of other diseases of the nervous system and sense organs: Secondary | ICD-10-CM | POA: Insufficient documentation

## 2012-09-22 DIAGNOSIS — Z87891 Personal history of nicotine dependence: Secondary | ICD-10-CM | POA: Insufficient documentation

## 2012-09-22 DIAGNOSIS — Z7982 Long term (current) use of aspirin: Secondary | ICD-10-CM | POA: Insufficient documentation

## 2012-09-22 DIAGNOSIS — J449 Chronic obstructive pulmonary disease, unspecified: Secondary | ICD-10-CM | POA: Insufficient documentation

## 2012-09-22 DIAGNOSIS — J4489 Other specified chronic obstructive pulmonary disease: Secondary | ICD-10-CM | POA: Insufficient documentation

## 2012-09-22 DIAGNOSIS — R42 Dizziness and giddiness: Secondary | ICD-10-CM | POA: Insufficient documentation

## 2012-09-22 LAB — URINALYSIS, ROUTINE W REFLEX MICROSCOPIC
Bilirubin Urine: NEGATIVE
Glucose, UA: NEGATIVE mg/dL
Hgb urine dipstick: NEGATIVE
Ketones, ur: NEGATIVE mg/dL
Leukocytes, UA: NEGATIVE
Nitrite: NEGATIVE
Protein, ur: NEGATIVE mg/dL
Specific Gravity, Urine: 1.02 (ref 1.005–1.030)
Urobilinogen, UA: 0.2 mg/dL (ref 0.0–1.0)
pH: 5 (ref 5.0–8.0)

## 2012-09-22 LAB — BASIC METABOLIC PANEL
BUN: 20 mg/dL (ref 6–23)
CO2: 24 mEq/L (ref 19–32)
Calcium: 9.1 mg/dL (ref 8.4–10.5)
Chloride: 107 mEq/L (ref 96–112)
Creatinine, Ser: 1.12 mg/dL (ref 0.50–1.35)
GFR calc Af Amer: 68 mL/min — ABNORMAL LOW (ref >90)
GFR calc non Af Amer: 59 mL/min — ABNORMAL LOW (ref >90)
Glucose, Bld: 103 mg/dL — ABNORMAL HIGH (ref 70–99)
Potassium: 4.2 mEq/L (ref 3.5–5.1)
Sodium: 140 mEq/L (ref 135–145)

## 2012-09-22 LAB — CBC
HCT: 32.5 % — ABNORMAL LOW (ref 39.0–52.0)
Hemoglobin: 10.6 g/dL — ABNORMAL LOW (ref 13.0–17.0)
MCH: 30.5 pg (ref 26.0–34.0)
MCHC: 32.6 g/dL (ref 30.0–36.0)
MCV: 93.7 fL (ref 78.0–100.0)
Platelets: 210 10*3/uL (ref 150–400)
RBC: 3.47 MIL/uL — ABNORMAL LOW (ref 4.22–5.81)
RDW: 15.5 % (ref 11.5–15.5)
WBC: 14.3 10*3/uL — ABNORMAL HIGH (ref 4.0–10.5)

## 2012-09-22 LAB — TROPONIN I: Troponin I: <0.3 ng/mL (ref <0.30)

## 2012-09-22 MED ORDER — SODIUM CHLORIDE 0.9 % IV BOLUS (SEPSIS)
500.0000 mL | Freq: Once | INTRAVENOUS | Status: AC
  Administered 2012-09-22: 500 mL via INTRAVENOUS

## 2012-09-22 MED ORDER — IOHEXOL 300 MG/ML  SOLN
80.0000 mL | Freq: Once | INTRAMUSCULAR | Status: AC | PRN
  Administered 2012-09-22: 80 mL via INTRAVENOUS

## 2012-09-22 MED ORDER — LEVOFLOXACIN 750 MG PO TABS: 750.0000 mg | ORAL_TABLET | Freq: Every day | ORAL | Status: DC

## 2012-09-22 MED ORDER — LEVOFLOXACIN 500 MG PO TABS
750.0000 mg | ORAL_TABLET | Freq: Every day | ORAL | Status: DC
  Administered 2012-09-22: 750 mg via ORAL
  Filled 2012-09-22: qty 2

## 2012-09-22 NOTE — ED Notes (Signed)
[id number]> Expected date:<BR> Expected time:<BR> Means of arrival:<BR> Comments:<BR> 79 y/o M/coughing up blood/dizziness

## 2012-09-22 NOTE — ED Notes (Signed)
Pt's daughter/ POA called, is concerned pt being discharged home, reports pt has had PNA several times over past year and worried pt has been having increased fatigue. Daughter's name: [name] # [phone]

## 2012-09-22 NOTE — ED Notes (Signed)
Per ems: pt from home, c/o coughing up "bright red blood" twice since this morning. C/o "dizziness". Hx of COPD. Uses 2L O2 at home. Lung sounds clear, reports taking "all medication" this morning. BP 126/68, pulse 70, saO2 95% RA, CBG 104

## 2012-09-22 NOTE — ED Notes (Signed)
[facility] here to transport pt home 

## 2012-09-22 NOTE — ED Provider Notes (Signed)
History    85yM with hemoptysis. Says sputum this morning was pink/light red in color. Noticed on two separate occasions. Pt with baseline dyspnea chronically on 2L home 02. Reports mild increase in SOB today. Denies CP. Is having pain in R lower back, "in my kidney area."  Denies trauma. No urinary complaints. No unusual leg pain or swelling. No hx of blood clot. On ASA and plavix. No fever or chills.   CSN: [id number]  Arrival date & time 09/22/12  1435   First MD Initiated Contact with Patient 09/22/12 1459      Chief Complaint  Patient presents with  . Hemoptysis  . Dizziness    (Consider location/radiation/quality/duration/timing/severity/associated sxs/prior treatment) HPI  Past Medical History  Diagnosis Date  . COPD (chronic obstructive pulmonary disease)   . CAD (coronary artery disease)   . HTN (hypertension)   . PUD (peptic ulcer disease)   . Syncope 07/25/11    x 2 today    Past Surgical History  Procedure Date  . Cataract extraction   . Gastrectomy   . Transurethral resection of prostate   . Coronary angioplasty with stent placement     stent in 2003    Family History  Problem Relation Age of Onset  . Hypertension    . Coronary artery disease    . Stroke Mother     History  Substance Use Topics  . Smoking status: Former Smoker -- 1.0 packs/day for 60 years    Types: Cigarettes    Quit date: 01/06/1997  . Smokeless tobacco: Never Used  . Alcohol Use: No     Comment: hx etoh abuse in past, daughter states quit over 20 years ago      Review of Systems  All systems reviewed and negative, other than as noted in HPI.   Allergies  Review of patient's allergies indicates no known allergies.  Home Medications   Current Outpatient Rx  Name  Route  Sig  Dispense  Refill  . ASPIRIN 81 MG PO CHEW   Oral   Chew 81 mg by mouth daily.         Marland Kitchen BRINZOLAMIDE-BRIMONIDINE 1-0.2 % OP SUSP   Both Eyes   Place 1 drop into both eyes 2 (two) times  daily.          Marland Kitchen VITAMIN D 2000 UNITS PO TABS   Oral   Take 2,000 Units by mouth daily.         Marland Kitchen CLOPIDOGREL BISULFATE 75 MG PO TABS   Oral   Take 75 mg by mouth daily.          Marland Kitchen MIRTAZAPINE 30 MG PO TABS   Oral   Take 30 mg by mouth daily.          Marland Kitchen OMEPRAZOLE 40 MG PO CPDR   Oral   Take 40 mg by mouth daily.          Marland Kitchen PRAVASTATIN SODIUM 40 MG PO TABS   Oral   Take 40 mg by mouth every evening.          Marland Kitchen TEMAZEPAM 30 MG PO CAPS   Oral   Take 30 mg by mouth at bedtime as needed. For sleep         . TIOTROPIUM BROMIDE MONOHYDRATE 18 MCG IN CAPS   Inhalation   Place 18 mcg into inhaler and inhale 2 (two) times daily.          . TRAVOPROST (BAK FREE) 0.004 %  OP SOLN   Both Eyes   Place 1 drop into both eyes at bedtime.          Marland Kitchen UNABLE TO FIND      Please drink Boost/Ensure/Breeze or any other supplemental nutrition three times a day with enough fluids to hydrate yourself.   1 Units   0     BP 122/46  Pulse 64  Temp 98.3 F (36.8 C) (Oral)  Resp 24  SpO2 99%  Physical Exam  Nursing note and vitals reviewed. Constitutional: He appears well-developed and well-nourished. No distress.       Laying in bed. Somewhat frail appearing, but NAD. Pleasant.   HENT:  Head: Normocephalic and atraumatic.  Eyes: Conjunctivae normal are normal. Right eye exhibits no discharge. Left eye exhibits no discharge.  Neck: Neck supple.  Cardiovascular: Normal rate, regular rhythm and normal heart sounds.  Exam reveals no gallop and no friction rub.   No murmur heard. Pulmonary/Chest: Effort normal and breath sounds normal. No respiratory distress. He has no wheezes.  Abdominal: Soft. He exhibits no distension. There is no tenderness.  Musculoskeletal: He exhibits no edema and no tenderness.       Lower extremities symmetric as compared to each other. No calf tenderness. Negative Homan's. No palpable cords. Back pain is not reproducible. No CVA tenderness.  No concerning skin lesions noted in area of reported pain.   Neurological: He is alert.  Skin: Skin is warm and dry.  Psychiatric: He has a normal mood and affect. His behavior is normal. Thought content normal.    ED Course  Procedures (including critical care time)  Labs Reviewed  CBC - Abnormal; Notable for the following:    WBC 14.3 (*)     RBC 3.47 (*)     Hemoglobin 10.6 (*)     HCT 32.5 (*)     All other components within normal limits  BASIC METABOLIC PANEL - Abnormal; Notable for the following:    Glucose, Bld 103 (*)     GFR calc non Af Amer 59 (*)     GFR calc Af Amer 68 (*)     All other components within normal limits  TROPONIN I  URINALYSIS, ROUTINE W REFLEX MICROSCOPIC   Dg Chest 2 View  09/22/2012  *RADIOLOGY REPORT*  Clinical Data: Hemoptysis.  Ex-smoker.  CHEST - 2 VIEW  Comparison: 09/04/2012 and 09/09/2011.  Findings: Trachea is midline.  Heart size normal.  Thoracic aorta is calcified.  There is nodular soft tissue prominence in the right infrahilar region, new from 09/09/2011. Lungs are emphysematous. Bibasilar scarring and/or atelectasis.  No pleural fluid. Surgical clips are seen in the region of the gastroesophageal junction.  IMPRESSION: Suspicious nodular density in the right infrahilar region.  Further evaluation with CT chest with contrast is recommended.  This could be performed non emergently, as clinically indicated. These results will be called to the ordering clinician or representative by the Radiologist Assistant, and communication documented in the PACS Dashboard.   Original Report Authenticated By: Leanna Battles, M.D.    Ct Chest W Contrast  09/22/2012  *RADIOLOGY REPORT*  Clinical Data: Hemoptysis.  CT CHEST WITH CONTRAST  Technique:  Multidetector CT imaging of the chest was performed following the standard protocol during bolus administration of intravenous contrast.  Contrast: 80mL OMNIPAQUE IOHEXOL 300 MG/ML  SOLN  Comparison: Chest radiograph  09/22/2012.  Findings: Mediastinal lymph nodes measure up to 10 mm in short axis in the AP window.  Minimal  bihilar lymphoid tissue.  No axillary adenopathy.  Atherosclerotic calcification of the arterial vasculature, including coronary arteries.  Heart size normal.  No pericardial effusion.  Postoperative changes are seen at the gastroesophageal junction.  Emphysema.  A lace-like area of opacity and mild architectural distortion is seen in the apical segment right upper lobe (image 10). Its rather amorphous appearance makes accurate measurement difficult.  Focal airspace consolidation and adjacent inflammatory changes are seen in the medial segment right middle lobe, accounting for the finding on today's chest radiograph.  Mild dependent air space disease in the lower lobes, right greater than left.  Irregular opacity with associated volume loss is seen in the left lower lobe, superior segment (image 28), measuring approximately 11 x 18 mm.  Peribronchovascular nodular consolidation in the lingula (image 34).  4 mm left lower lobe nodule (image 37).  No pleural fluid.  Airway is unremarkable.  Incidental imaging of the upper abdomen shows low attenuation lesions in the liver measure up to 8 mm in the left hepatic lobe. Postoperative changes are seen in the stomach.  No worrisome lytic or sclerotic lesions. Upper thoracic compression fracture is unchanged from 09/04/2012.  IMPRESSION:  1.  Inflammatory air space consolidation in the medial segment right middle lobe accounts for the questioned abnormality on today's chest radiograph. Probable mild involvement of the lingula. While pneumonia is strongly favored, follow-up to clearing is strongly recommended to exclude malignancy. 2. Lace-like and irregular retractile opacities in the right upper lobe and left lower lobe, respectively.  Attention on follow-up imaging is recommended as malignancy cannot be excluded. 3.  Tiny left lower lobe nodule. Given risk factors  for bronchogenic carcinoma, follow-up chest CT at 1 year is recommended.  This recommendation follows the consensus statement: Guidelines for Management of Small Pulmonary Nodules Detected on CT Scans:  A Statement from the Fleischner Society as published in Radiology 2005; 237:395-400.   Original Report Authenticated By: Leanna Battles, M.D.    EKG:  Rhythm: normal sinus Vent. rate 72 BPM PR interval 236 ms QRS duration 74 ms QT/QTc 396/433 ms ST segments: NS ST changes Comparison: Little interval change   1. HAP (hospital-acquired pneumonia)       MDM  85yM with what sounds like minimal hemoptysis. No respiratory distress on exam. O2 sats good on home O2 requirement. Consider infectious, PE, malignancy, etc. Will check CXR, EKG, basic labs including trop although symptoms atypical for ACS.  Low clinical suspicion for PE. Not tachycardic and doesn't appear to be on rate controlling meds. No new oxygen requirement. Aside from hemoptysis, nothing in hx to suggest.  CXR with suspicious density and subsequent CT shows what is likely pneumonia. wuold explain hemoptysis, mild dyspnea and potentially R back pain. I feel pt is appropriate for outpt tx at this time. No increased WOB. O2 sats 96-99% on 2L which is his baseline. Pt is alert and appropriate.         Raeford Razor, MD 09/22/12 1740

## 2012-09-22 NOTE — ED Notes (Signed)
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 Dx with right sided PNA x1 mo ago. Reports pain on "right lung" and SOB

## 2012-09-22 NOTE — ED Notes (Signed)
[facility] called for pt transportation home 

## 2013-03-14 ENCOUNTER — Other Ambulatory Visit (HOSPITAL_COMMUNITY): Payer: Self-pay | Admitting: Cardiovascular Disease

## 2013-03-14 DIAGNOSIS — I6523 Occlusion and stenosis of bilateral carotid arteries: Secondary | ICD-10-CM

## 2013-03-22 ENCOUNTER — Ambulatory Visit (HOSPITAL_COMMUNITY)
Admission: RE | Admit: 2013-03-22 | Discharge: 2013-03-22 | Disposition: A | Discharge status: Home / Self Care | Payer: Medicare Other | Source: Ambulatory Visit | Attending: Cardiovascular Disease | Admitting: Cardiovascular Disease

## 2013-03-22 DIAGNOSIS — I6523 Occlusion and stenosis of bilateral carotid arteries: Secondary | ICD-10-CM

## 2013-03-22 DIAGNOSIS — I779 Disorder of arteries and arterioles, unspecified: Secondary | ICD-10-CM

## 2013-03-22 DIAGNOSIS — I6529 Occlusion and stenosis of unspecified carotid artery: Secondary | ICD-10-CM | POA: Insufficient documentation

## 2013-03-22 HISTORY — DX: Disorder of arteries and arterioles, unspecified: I77.9

## 2013-03-22 NOTE — Progress Notes (Signed)
Carotid Duplex Completed. Abdirahman Chittum, BS, RDMS, RVT  

## 2013-06-15 ENCOUNTER — Encounter: Payer: Self-pay | Admitting: *Deleted

## 2013-06-17 ENCOUNTER — Ambulatory Visit (INDEPENDENT_AMBULATORY_CARE_PROVIDER_SITE_OTHER): Payer: Medicare Other | Admitting: Cardiovascular Disease

## 2013-06-17 ENCOUNTER — Encounter: Payer: Self-pay | Admitting: Cardiovascular Disease

## 2013-06-17 VITALS — BP 148/80 | HR 69 | Ht 67.0 in | Wt 121.1 lb

## 2013-06-17 DIAGNOSIS — E785 Hyperlipidemia, unspecified: Secondary | ICD-10-CM | POA: Insufficient documentation

## 2013-06-17 DIAGNOSIS — I2510 Atherosclerotic heart disease of native coronary artery without angina pectoris: Secondary | ICD-10-CM

## 2013-06-17 DIAGNOSIS — I739 Peripheral vascular disease, unspecified: Secondary | ICD-10-CM

## 2013-06-17 DIAGNOSIS — I10 Essential (primary) hypertension: Secondary | ICD-10-CM

## 2013-06-17 DIAGNOSIS — R5381 Other malaise: Secondary | ICD-10-CM

## 2013-06-17 DIAGNOSIS — R42 Dizziness and giddiness: Secondary | ICD-10-CM

## 2013-06-17 DIAGNOSIS — R531 Weakness: Secondary | ICD-10-CM

## 2013-06-17 DIAGNOSIS — Z79899 Other long term (current) drug therapy: Secondary | ICD-10-CM

## 2013-06-17 DIAGNOSIS — Z9981 Dependence on supplemental oxygen: Secondary | ICD-10-CM

## 2013-06-17 DIAGNOSIS — J449 Chronic obstructive pulmonary disease, unspecified: Secondary | ICD-10-CM

## 2013-06-17 DIAGNOSIS — R011 Cardiac murmur, unspecified: Secondary | ICD-10-CM

## 2013-06-17 LAB — CBC
HCT: 38.8 % — ABNORMAL LOW (ref 39.0–52.0)
Hemoglobin: 12.6 g/dL — ABNORMAL LOW (ref 13.0–17.0)
MCH: 30.2 pg (ref 26.0–34.0)
MCV: 93 fL (ref 78.0–100.0)
Platelets: 254 10*3/uL (ref 150–400)
RBC: 4.17 MIL/uL — ABNORMAL LOW (ref 4.22–5.81)

## 2013-06-17 LAB — COMPREHENSIVE METABOLIC PANEL
Alkaline Phosphatase: 60 U/L (ref 39–117)
Creat: 1.48 mg/dL — ABNORMAL HIGH (ref 0.50–1.35)
Glucose, Bld: 103 mg/dL — ABNORMAL HIGH (ref 70–99)
Sodium: 142 mEq/L (ref 135–145)
Total Bilirubin: 0.3 mg/dL (ref 0.3–1.2)
Total Protein: 6.9 g/dL (ref 6.0–8.3)

## 2013-06-17 LAB — LIPID PANEL
HDL: 65 mg/dL (ref >39)
Total CHOL/HDL Ratio: 2.5 Ratio

## 2013-06-17 NOTE — Patient Instructions (Signed)
Your physician recommends that you schedule a follow-up appointment in: 3 months  Your physician recommends that you return for lab work in: CBC, CMP, LIPIDS, TSH  Your physician has requested that you have a lower extremity arterial duplex. This test is an ultrasound of the arteries in the legs. It looks at arterial blood flow in the legs. Allow one hour for Lower Arterial scans. There are no restrictions or special instructions  Your physician has requested that you have an echocardiogram. Echocardiography is a painless test that uses sound waves to create images of your heart. It provides your doctor with information about the size and shape of your heart and how well your heart's chambers and valves are working. This procedure takes approximately one hour. There are no restrictions for this procedure.

## 2013-06-17 NOTE — Progress Notes (Signed)
Patient ID: Alec Walker, male   DOB: Jan 05, 1927, 79 y.o.   MRN: [id number]     PATIENT PROFILE:  Alec Walker is an 79 year old patient who is a former patient of Dr. Susa Griffins.He presents to establish cardiology care with me.   HPI: Alec Walker has been followed by Dr. Alanda Amass apparently for over 20 years. Normally, he was found to have mild coronary artery disease with 40% LAD stenosis, 40% diagonal stenosis and 40% RCA stenosis by cardiac catheterization which was done in 1999 at [facility]. He has documented peripheral vascular disease and has documented 90% vertebral artery narrowing as well as 40% subclavian artery narrowing. In 1999 he underwent intervention with stenting of his right common iliac artery. He has a history of documented carotid disease. He apparently underwent carotid Doppler study which had been ordered by Dr. Alanda Amass in August 2014 which showed 50-69% diameter reduction bilaterally with velocities in the upper range of the spectrum. He doesn't admit to claudication symptoms to his leg. He does note more cramping in the back of his legs. His last lower LE Doppler study was done in October 2013 which showed normal ABIs. He had distal taper of his aorta. His right common iliac stent had elevated velocities suggesting a 50-69% diameter reduction. The right common femoral artery had 50-69% stenosis. The right peroneal seem to be occlusive. The proximal posterior tibial artery on the right suggesting 50-69% reduction. The left common iliac artery suggested narrowing less than 50%. The left common femoral had 50-69% diameter reduction in the left anterior tibial also appear to be occlusive with reconstitution noted.  He states that at times he has noted some episodes of dizziness. He did have antegrade flow in both vertebrals on his most recent carotid study. He does have 50-69% diameter reduction on the study of the left subclavian.  Additional problems  include hyperlipidemia for which he's been taking Pravachol. Depression for which he takes mirtazapine. COPD for which he has been on Spiriva the he is on nocturnal oxygen at night at 2 L. He is status post peptic ulcer disease surgery by Dr. Orpah Greek in 1976.  Review of his chart indicates that he did have a nuclear perfusion study in September 2013 which suggested diaphragmatic attenuation inferiorly. Post-stress ejection fraction was 62%. His last echo Doppler study was 2 years ago which showed LVH.    Past Medical History  Diagnosis Date  . COPD (chronic obstructive pulmonary disease)   . CAD (coronary artery disease)   . HTN (hypertension)   . PUD (peptic ulcer disease)   . Syncope 07/25/11    x 2 today  . Cerebral atherosclerosis 03/22/13    carotid duplex- compred to prior study there is marked increase in the bilateral ICA velocities.   . Claudication 05/18/12    lower arterial doppler-there is mild significant changes noted when compared to the previous study dated in 2007  . Carotid artery occlusion     Past Surgical History  Procedure Laterality Date  . Cataract extraction    . Gastrectomy    . Transurethral resection of prostate    . Coronary angioplasty with stent placement      stent in 2003    No Known Allergies  Current Outpatient Prescriptions  Medication Sig Dispense Refill  . aspirin 81 MG chewable tablet Chew 81 mg by mouth daily.      . Brinzolamide-Brimonidine (SIMBRINZA) 1-0.2 % SUSP Place 1 drop into both eyes 2 (two) times daily.       Marland Kitchen  Cholecalciferol (VITAMIN D) 2000 UNITS tablet Take 2,000 Units by mouth daily.      . clopidogrel (PLAVIX) 75 MG tablet Take 75 mg by mouth daily.       Marland Kitchen lactose free nutrition (BOOST) LIQD Take 237 mLs by mouth daily.      . mirtazapine (REMERON) 30 MG tablet Take 30 mg by mouth daily.       Marland Kitchen omeprazole (PRILOSEC) 40 MG capsule Take 40 mg by mouth daily.       Docia Barrier IN Inhale into the lungs. 2 liters      .  pravastatin (PRAVACHOL) 40 MG tablet Take 40 mg by mouth every evening.       . temazepam (RESTORIL) 30 MG capsule Take 30 mg by mouth at bedtime as needed. For sleep      . tiotropium (SPIRIVA) 18 MCG inhalation capsule Place 18 mcg into inhaler and inhale 2 (two) times daily.       . Travoprost, BAK Free, (TRAVATAN) 0.004 % SOLN ophthalmic solution Place 1 drop into both eyes at bedtime.        No current facility-administered medications for this visit.    Social history is notable in that he is married lives with his wife. She is chronically ill. He has 2 children 5 grandchildren 2 great grandchildren. He quit smoking in 2000. Her hobby in the past he is building bird houses. There is remote alcohol history the  Family History  Problem Relation Age of Onset  . Hypertension    . Coronary artery disease    . Stroke Mother   . Heart attack Brother     ROS is negative for fever chills or night sweats. He denies any visual changes. He does note some easy bruisability. He does have COPD. He denies recent wheezing. He denies cough or sputum production. He does admit to episodes of dizziness. These are not classically this septated with position changes. He denies chest pressure. He denies change in weight. He denies abdominal pain. He does have a history of peptic ulcer disease. He denies urinary symptoms. He denies blood in the stool or urine. He does admit to cramps in his legs with walking particularly in the back of his legs. He did denies paresthesias. He is not aware of diabetes or other endocrine problems. He denies palpitations. He denies recent change in mood. Other comprehensive 12 point system review is negative.  PE BP 148/80  Pulse 69  Ht 5\' 7"  (1.702 m)  Wt 121 lb 1.6 oz (54.931 kg)  BMI 18.96 kg/m2 Repeat blood pressure by me was 140/80 supine 132/78 standing. General: Alert, oriented, no distress.  Skin: normal turgor, no rashes; probable lipoma in the region of the right  anticubital fossa HEENT: Normocephalic, atraumatic. Pupils round and reactive; sclera anicteric; Fundi arteriolar narrowing Nose without nasal septal hypertrophy Mouth/Parynx benign; Mallinpatti scale; no upper teeth. Neck: No JVD, right carotid briut Lungs: Decreased breath sounds without wheezing; no  Rales No chest wall tenderness to palpation Heart: RRR, s1 s2 normal 1/6 systolic murmur. No S3 gallop. Abdomen: soft, nontender; no hepatosplenomehaly, BS+; abdominal aorta nontender and not dilated by palpation. Normal male genitalia; rectal not done Pulses bilateral femoral artery bruits. Slightly decreased pulses on the right compared to left Extremities: no clubbinbg cyanosis or edema, Homan's sign negative  Neurologic: grossly nonfocal Psychological: Normal affect and mood    ECG: Sinus rhythm. ST segment changes inferolaterally in II, III, and F V4 through V6.  LABS:  BMET    Component Value Date/Time   NA 140 09/22/2012 1530   K 4.2 09/22/2012 1530   CL 107 09/22/2012 1530   CO2 24 09/22/2012 1530   GLUCOSE 103* 09/22/2012 1530   BUN 20 09/22/2012 1530   CREATININE 1.12 09/22/2012 1530   CALCIUM 9.1 09/22/2012 1530   GFRNONAA 59* 09/22/2012 1530   GFRAA 68* 09/22/2012 1530     Hepatic Function Panel     Component Value Date/Time   PROT 6.5 09/04/2012 1911   ALBUMIN 3.3* 09/04/2012 1911   AST 12 09/04/2012 1911   ALT 7 09/04/2012 1911   ALKPHOS 64 09/04/2012 1911   BILITOT 0.1* 09/04/2012 1911     CBC    Component Value Date/Time   WBC 14.3* 09/22/2012 1530   RBC 3.47* 09/22/2012 1530   HGB 10.6* 09/22/2012 1530   HCT 32.5* 09/22/2012 1530   PLT 210 09/22/2012 1530   MCV 93.7 09/22/2012 1530   MCH 30.5 09/22/2012 1530   MCHC 32.6 09/22/2012 1530   RDW 15.5 09/22/2012 1530   LYMPHSABS 2.1 09/04/2012 1911   MONOABS 0.8 09/04/2012 1911   EOSABS 0.6 09/04/2012 1911   BASOSABS 0.0 09/04/2012 1911     BNP No results found for this basename: probnp    Lipid Panel     Component Value  Date/Time   CHOL 164 07/27/2011 0835   TRIG 109 07/27/2011 0835   HDL 46 07/27/2011 0835   CHOLHDL 3.6 07/27/2011 0835   VLDL 22 07/27/2011 0835   LDLCALC 96 07/27/2011 0835     RADIOLOGY: No results found.   ASSESSMENT AND PLAN: Mr. Milbert Coulter is an 79 year old patient who had previously been followed by Dr. Alanda Amass for many years. He has significant medical history notable for mild CAD, significant peripheral vascular disease status post prior right common iliac stenting with some claudication on exam and with documented occlusive disease and peroneal onthe right and anterior tibial on the left. On exam today, he is not orthostatic with repeat blood pressure assessment by me. He has not had laboratory in approximately a year. He's not having any anginal symptoms. He does have claudication. He does have ST-T changes on his ECG which have been present previously. Prior to making any medication changes, I am recommending that he obtain a 2-D echo Doppler study. I will also check a complete set of laboratory. I will also do a one-year followup lower extremity Doppler study to reassess his claudication symptoms. I'll see him back in the office in several months for followup evaluation and further adjustments were made at that time if necessary.   Lennette Bihari, MD, [facility] 06/17/2013 9:49 AM

## 2013-07-01 ENCOUNTER — Ambulatory Visit (HOSPITAL_COMMUNITY)
Admission: RE | Admit: 2013-07-01 | Discharge: 2013-07-01 | Disposition: A | Discharge status: Home / Self Care | Payer: Medicare Other | Source: Ambulatory Visit | Attending: Cardiovascular Disease | Admitting: Cardiovascular Disease

## 2013-07-01 DIAGNOSIS — I739 Peripheral vascular disease, unspecified: Secondary | ICD-10-CM

## 2013-07-01 NOTE — Progress Notes (Signed)
Lower Extremity Arterial Duplex Completed. °Brianna L Mazza,RVT °

## 2013-07-11 ENCOUNTER — Ambulatory Visit (HOSPITAL_COMMUNITY)
Admission: RE | Admit: 2013-07-11 | Discharge: 2013-07-11 | Disposition: A | Discharge status: Home / Self Care | Payer: Medicare Other | Source: Ambulatory Visit | Attending: Cardiovascular Disease | Admitting: Cardiovascular Disease

## 2013-07-11 DIAGNOSIS — E785 Hyperlipidemia, unspecified: Secondary | ICD-10-CM | POA: Insufficient documentation

## 2013-07-11 DIAGNOSIS — R079 Chest pain, unspecified: Secondary | ICD-10-CM | POA: Insufficient documentation

## 2013-07-11 DIAGNOSIS — R011 Cardiac murmur, unspecified: Secondary | ICD-10-CM

## 2013-07-11 DIAGNOSIS — R0989 Other specified symptoms and signs involving the circulatory and respiratory systems: Secondary | ICD-10-CM | POA: Insufficient documentation

## 2013-07-11 DIAGNOSIS — J449 Chronic obstructive pulmonary disease, unspecified: Secondary | ICD-10-CM | POA: Insufficient documentation

## 2013-07-11 DIAGNOSIS — J4489 Other specified chronic obstructive pulmonary disease: Secondary | ICD-10-CM | POA: Insufficient documentation

## 2013-07-11 DIAGNOSIS — R42 Dizziness and giddiness: Secondary | ICD-10-CM

## 2013-07-11 DIAGNOSIS — I2510 Atherosclerotic heart disease of native coronary artery without angina pectoris: Secondary | ICD-10-CM

## 2013-07-11 DIAGNOSIS — R0609 Other forms of dyspnea: Secondary | ICD-10-CM | POA: Insufficient documentation

## 2013-07-11 DIAGNOSIS — I739 Peripheral vascular disease, unspecified: Secondary | ICD-10-CM

## 2013-07-11 NOTE — Progress Notes (Signed)
2D Echo Performed 07/11/2013    Dhillon Comunale, RCS  

## 2013-07-19 ENCOUNTER — Telehealth: Payer: Self-pay | Admitting: *Deleted

## 2013-07-19 NOTE — Telephone Encounter (Signed)
Return call in reference to doppler results.

## 2013-07-19 NOTE — Telephone Encounter (Signed)
Message copied by Gaynelle Cage on Tue Jul 19, 2013  3:19 PM ------      Message from: Nicki Guadalajara A      Created: Sun Jul 10, 2013  7:46 PM       Progressive abnl c/w/last yr. Refer to JB for PV eval ------

## 2013-07-27 ENCOUNTER — Telehealth: Payer: Self-pay | Admitting: *Deleted

## 2013-07-27 NOTE — Telephone Encounter (Signed)
Pt stated that you were supposed to call him back about an appointment. I had no idea what he was talking about so I could not help him.Marland KitchenMarland Kitchen

## 2013-07-28 ENCOUNTER — Encounter: Payer: Self-pay | Admitting: Cardiovascular Disease

## 2013-07-28 NOTE — Telephone Encounter (Signed)
Needs to be scheduled. °

## 2013-07-28 NOTE — Telephone Encounter (Signed)
I sent a staff message that patient needs PV evaluation with JB.

## 2013-08-04 ENCOUNTER — Encounter: Payer: Self-pay | Admitting: Cardiovascular Disease

## 2013-08-04 ENCOUNTER — Ambulatory Visit (INDEPENDENT_AMBULATORY_CARE_PROVIDER_SITE_OTHER): Payer: Medicare Other | Admitting: Cardiovascular Disease

## 2013-08-04 VITALS — BP 150/80 | HR 80 | Ht 67.0 in | Wt 117.0 lb

## 2013-08-04 DIAGNOSIS — I739 Peripheral vascular disease, unspecified: Secondary | ICD-10-CM

## 2013-08-04 DIAGNOSIS — D689 Coagulation defect, unspecified: Secondary | ICD-10-CM

## 2013-08-04 DIAGNOSIS — Z01818 Encounter for other preprocedural examination: Secondary | ICD-10-CM

## 2013-08-04 DIAGNOSIS — R5383 Other fatigue: Secondary | ICD-10-CM

## 2013-08-04 DIAGNOSIS — R5381 Other malaise: Secondary | ICD-10-CM

## 2013-08-04 DIAGNOSIS — Z79899 Other long term (current) drug therapy: Secondary | ICD-10-CM

## 2013-08-04 NOTE — Assessment & Plan Note (Signed)
Followed by duplex ultrasound was recently done in August revealing a marked increase in his bilateral internal carotid artery velocities. We'll continue to follow this on a every 6 month basis.

## 2013-08-04 NOTE — Patient Instructions (Signed)
Dr. Allyson Sabal has ordered a peripheral angiogram to be done at [facility].  This procedure is going to look at the bloodflow in your lower extremities.  If Dr. Allyson Sabal is able to open up the arteries, you will have to spend one night in the hospital.  If he is not able to open the arteries, you will be able to go home that same day.    After the procedure, you will not be allowed to drive for 3 days or push, pull, or lift anything greater than 10 lbs for one week.    You will be required to have bloodwork prior to your procedure.  Our scheduler will advise you on when these items need to be done.   Reps Norlene Campbell

## 2013-08-04 NOTE — Assessment & Plan Note (Signed)
The patient was referred to me by Dr. Daphene Jaeger for symptomatic claudication and known peripheral arterial disease. He was previously a patient of Dr. Arline Asp who performed right common iliac artery stenting 12/19/97 document documented bilateral common femoral artery disease as well. Since his prior Doppler severe ago he's had progression of disease in his common femoral arteries with medical patient of Dr. Last several months. It is agreed to perform angiography and potential intervention.

## 2013-08-04 NOTE — Progress Notes (Signed)
08/04/2013 Alec Walker   Sep 25, 1926  [id number]  Primary Physician Eartha Inch, MD Primary Cardiologist: Runell Gess MD [name]   HPI:  Mr.Godina has been followed by Dr. Alanda Amass apparently for over 20 years. Normally, he was found to have mild coronary artery disease with 40% LAD stenosis, 40% diagonal stenosis and 40% RCA stenosis by cardiac catheterization which was done in 1999 at [facility]. He has documented peripheral vascular disease and has documented 90% vertebral artery narrowing as well as 40% subclavian artery narrowing. In 1999 he underwent intervention with stenting of his right common iliac artery. He has a history of documented carotid disease. He apparently underwent carotid Doppler study which had been ordered by Dr. Alanda Amass in August 2014 which showed 50-69% diameter reduction bilaterally with velocities in the upper range of the spectrum. He doesn't admit to claudication symptoms to his leg. He does note more cramping in the back of his legs. His last lower LE Doppler study was done in October 2013 which showed normal ABIs. He had distal taper of his aorta. His right common iliac stent had elevated velocities suggesting a 50-69% diameter reduction. The right common femoral artery had 50-69% stenosis. The right peroneal seem to be occlusive. The proximal posterior tibial artery on the right suggesting 50-69% reduction. The left common iliac artery suggested narrowing less than 50%. The left common femoral had 50-69% diameter reduction in the left anterior tibial also appear to be occlusive with reconstitution noted. He states that at times he has noted some episodes of dizziness. He did have antegrade flow in both vertebrals on his most recent carotid study. He does have 50-69% diameter reduction on the study of the left subclavian.  Additional problems include hyperlipidemia for which he's been taking Pravachol. Depression for which  he takes mirtazapine. COPD for which he has been on Spiriva the he is on nocturnal oxygen at night at 2 L. He is status post peptic ulcer disease surgery by Dr. Orpah Greek in 1976.  Review of his chart indicates that he did have a nuclear perfusion study in September 2013 which suggested diaphragmatic attenuation inferiorly. Post-stress ejection fraction was 62%. His last echo Doppler study was 2 years ago which showed LVH.  Your recent arterial Dopplers that showed progression of disease in his common iliac and common femoral arteries. He's had progressive left lower leg claudication as well. He's also had progression of disease in his internal carotid arteries bilaterally and remains neurologically asymptomatic.    Current Outpatient Prescriptions  Medication Sig Dispense Refill  . aspirin 81 MG chewable tablet Chew 81 mg by mouth daily.      . Brinzolamide-Brimonidine (SIMBRINZA) 1-0.2 % SUSP Place 1 drop into both eyes 2 (two) times daily.       . Cholecalciferol (VITAMIN D) 2000 UNITS tablet Take 2,000 Units by mouth daily.      . clopidogrel (PLAVIX) 75 MG tablet Take 75 mg by mouth daily.       Marland Kitchen lactose free nutrition (BOOST) LIQD Take 237 mLs by mouth daily.      Marland Kitchen omeprazole (PRILOSEC) 40 MG capsule Take 40 mg by mouth daily.       Docia Barrier IN Inhale into the lungs. 2 liters      . pravastatin (PRAVACHOL) 40 MG tablet Take 40 mg by mouth every evening.       . temazepam (RESTORIL) 30 MG capsule Take 30 mg by mouth at bedtime as needed.  For sleep      . tiotropium (SPIRIVA) 18 MCG inhalation capsule Place 18 mcg into inhaler and inhale 2 (two) times daily.       . Travoprost, BAK Free, (TRAVATAN) 0.004 % SOLN ophthalmic solution Place 1 drop into both eyes at bedtime.        No current facility-administered medications for this visit.    No Known Allergies  History   Social History  . Marital Status: Married    Spouse Name: N/A    Number of Children: 3  . Years of  Education: N/A   Occupational History  . retired     Social History Main Topics  . Smoking status: Former Smoker -- 1.00 packs/day for 60 years    Types: Cigarettes    Quit date: 01/06/1997  . Smokeless tobacco: Never Used  . Alcohol Use: No     Comment: hx etoh abuse in past, daughter states quit over 20 years ago  . Drug Use: No  . Sexual Activity: No   Other Topics Concern  . Not on file   Social History Narrative  . No narrative on file     Review of Systems: General: negative for chills, fever, night sweats or weight changes.  Cardiovascular: negative for chest pain, dyspnea on exertion, edema, orthopnea, palpitations, paroxysmal nocturnal dyspnea or shortness of breath Dermatological: negative for rash Respiratory: negative for cough or wheezing Urologic: negative for hematuria Abdominal: negative for nausea, vomiting, diarrhea, bright red blood per rectum, melena, or hematemesis Neurologic: negative for visual changes, syncope, or dizziness All other systems reviewed and are otherwise negative except as noted above.    Blood pressure 150/80, pulse 80, height 5\' 7"  (1.702 m), weight 117 lb (53.071 kg).  General appearance: alert and no distress Neck: no adenopathy, no JVD, supple, symmetrical, trachea midline, thyroid not enlarged, symmetric, no tenderness/mass/nodules and loud bilateral carotid bruits Lungs: clear to auscultation bilaterally Heart: regular rate and rhythm, S1, S2 normal, no murmur, click, rub or gallop Extremities: femoral arteries are 2+ with bruits bilaterally  EKG not performed today  ASSESSMENT AND PLAN:   PVD (peripheral vascular disease) The patient was referred to me by Dr. Daphene Jaegerom Kelly for symptomatic claudication and known peripheral arterial disease. He was previously a patient of Dr. Arline AspJohn Grubbs who performed right common iliac artery stenting 12/19/97 document documented bilateral common femoral artery disease as well. Since his prior  Doppler severe ago he's had progression of disease in his common femoral arteries with medical patient of Dr. Last several months. It is agreed to perform angiography and potential intervention.  Carotid artery disease Followed by duplex ultrasound was recently done in August revealing a marked increase in his bilateral internal carotid artery velocities. We'll continue to follow this on a every 6 month basis.      Runell GessJonathan J. Caelin Rosen MD FACP,FACC,FAHA, [facility] 08/04/2013 10:44 AM

## 2013-08-16 ENCOUNTER — Ambulatory Visit: Payer: Medicare Other | Admitting: Cardiovascular Disease

## 2013-08-18 DIAGNOSIS — J189 Pneumonia, unspecified organism: Secondary | ICD-10-CM

## 2013-08-18 HISTORY — DX: Pneumonia, unspecified organism: J18.9

## 2013-08-20 ENCOUNTER — Emergency Department (HOSPITAL_COMMUNITY)
Admission: EM | Admit: 2013-08-20 | Discharge: 2013-08-21 | Disposition: A | Discharge status: Home / Self Care | Payer: Medicare Other | Attending: Emergency Medicine | Admitting: Emergency Medicine

## 2013-08-20 ENCOUNTER — Encounter (HOSPITAL_COMMUNITY): Payer: Self-pay | Admitting: Emergency Medicine

## 2013-08-20 ENCOUNTER — Emergency Department (HOSPITAL_COMMUNITY): Payer: Medicare Other

## 2013-08-20 DIAGNOSIS — Z7902 Long term (current) use of antithrombotics/antiplatelets: Secondary | ICD-10-CM | POA: Insufficient documentation

## 2013-08-20 DIAGNOSIS — Z79899 Other long term (current) drug therapy: Secondary | ICD-10-CM | POA: Insufficient documentation

## 2013-08-20 DIAGNOSIS — Z9981 Dependence on supplemental oxygen: Secondary | ICD-10-CM | POA: Insufficient documentation

## 2013-08-20 DIAGNOSIS — J4489 Other specified chronic obstructive pulmonary disease: Secondary | ICD-10-CM | POA: Insufficient documentation

## 2013-08-20 DIAGNOSIS — Z9861 Coronary angioplasty status: Secondary | ICD-10-CM | POA: Insufficient documentation

## 2013-08-20 DIAGNOSIS — Z7982 Long term (current) use of aspirin: Secondary | ICD-10-CM | POA: Insufficient documentation

## 2013-08-20 DIAGNOSIS — I10 Essential (primary) hypertension: Secondary | ICD-10-CM | POA: Insufficient documentation

## 2013-08-20 DIAGNOSIS — J159 Unspecified bacterial pneumonia: Secondary | ICD-10-CM | POA: Insufficient documentation

## 2013-08-20 DIAGNOSIS — I2510 Atherosclerotic heart disease of native coronary artery without angina pectoris: Secondary | ICD-10-CM | POA: Insufficient documentation

## 2013-08-20 DIAGNOSIS — Z8719 Personal history of other diseases of the digestive system: Secondary | ICD-10-CM | POA: Insufficient documentation

## 2013-08-20 DIAGNOSIS — R5383 Other fatigue: Secondary | ICD-10-CM

## 2013-08-20 DIAGNOSIS — J189 Pneumonia, unspecified organism: Secondary | ICD-10-CM

## 2013-08-20 DIAGNOSIS — J449 Chronic obstructive pulmonary disease, unspecified: Secondary | ICD-10-CM | POA: Insufficient documentation

## 2013-08-20 DIAGNOSIS — R5381 Other malaise: Secondary | ICD-10-CM | POA: Insufficient documentation

## 2013-08-20 DIAGNOSIS — Z87891 Personal history of nicotine dependence: Secondary | ICD-10-CM | POA: Insufficient documentation

## 2013-08-20 LAB — CBC WITH DIFFERENTIAL/PLATELET
BASOS PCT: 0 % (ref 0–1)
Basophils Absolute: 0 10*3/uL (ref 0.0–0.1)
Eosinophils Absolute: 0 10*3/uL (ref 0.0–0.7)
Eosinophils Relative: 0 % (ref 0–5)
HCT: 35.2 % — ABNORMAL LOW (ref 39.0–52.0)
Hemoglobin: 11.1 g/dL — ABNORMAL LOW (ref 13.0–17.0)
LYMPHS ABS: 0.9 10*3/uL (ref 0.7–4.0)
Lymphocytes Relative: 7 % — ABNORMAL LOW (ref 12–46)
MCH: 30.2 pg (ref 26.0–34.0)
MCHC: 31.5 g/dL (ref 30.0–36.0)
MCV: 95.9 fL (ref 78.0–100.0)
Monocytes Absolute: 0.8 10*3/uL (ref 0.1–1.0)
Monocytes Relative: 7 % (ref 3–12)
Neutro Abs: 10.1 10*3/uL — ABNORMAL HIGH (ref 1.7–7.7)
Neutrophils Relative %: 86 % — ABNORMAL HIGH (ref 43–77)
PLATELETS: 205 10*3/uL (ref 150–400)
RBC: 3.67 MIL/uL — AB (ref 4.22–5.81)
RDW: 16.2 % — ABNORMAL HIGH (ref 11.5–15.5)
WBC: 11.8 10*3/uL — ABNORMAL HIGH (ref 4.0–10.5)

## 2013-08-20 LAB — COMPREHENSIVE METABOLIC PANEL
ALBUMIN: 3.5 g/dL (ref 3.5–5.2)
ALK PHOS: 57 U/L (ref 39–117)
ALT: 13 U/L (ref 0–53)
AST: 19 U/L (ref 0–37)
BUN: 24 mg/dL — ABNORMAL HIGH (ref 6–23)
CO2: 23 mEq/L (ref 19–32)
Calcium: 9.2 mg/dL (ref 8.4–10.5)
Chloride: 107 mEq/L (ref 96–112)
Creatinine, Ser: 1.27 mg/dL (ref 0.50–1.35)
GFR calc Af Amer: 58 mL/min — ABNORMAL LOW (ref >90)
GFR calc non Af Amer: 50 mL/min — ABNORMAL LOW (ref >90)
Glucose, Bld: 97 mg/dL (ref 70–99)
POTASSIUM: 4.6 meq/L (ref 3.7–5.3)
SODIUM: 141 meq/L (ref 137–147)
TOTAL PROTEIN: 7.2 g/dL (ref 6.0–8.3)
Total Bilirubin: <0.2 mg/dL — ABNORMAL LOW (ref 0.3–1.2)

## 2013-08-20 LAB — LIPASE, BLOOD: Lipase: 40 U/L (ref 11–59)

## 2013-08-20 LAB — URINALYSIS, ROUTINE W REFLEX MICROSCOPIC
Bilirubin Urine: NEGATIVE
GLUCOSE, UA: NEGATIVE mg/dL
HGB URINE DIPSTICK: NEGATIVE
Ketones, ur: NEGATIVE mg/dL
Leukocytes, UA: NEGATIVE
Nitrite: NEGATIVE
Protein, ur: NEGATIVE mg/dL
SPECIFIC GRAVITY, URINE: 1.008 (ref 1.005–1.030)
Urobilinogen, UA: 0.2 mg/dL (ref 0.0–1.0)
pH: 5 (ref 5.0–8.0)

## 2013-08-20 LAB — CG4 I-STAT (LACTIC ACID): LACTIC ACID, VENOUS: 1.7 mmol/L (ref 0.5–2.2)

## 2013-08-20 LAB — TROPONIN I: Troponin I: <0.3 ng/mL (ref <0.30)

## 2013-08-20 MED ORDER — PREDNISONE 20 MG PO TABS
60.0000 mg | ORAL_TABLET | ORAL | Status: AC
  Administered 2013-08-20: 60 mg via ORAL
  Filled 2013-08-20: qty 3

## 2013-08-20 MED ORDER — PREDNISONE 20 MG PO TABS: 40.0000 mg | ORAL_TABLET | Freq: Every day | ORAL | Status: AC

## 2013-08-20 MED ORDER — AZITHROMYCIN 250 MG PO TABS: 250.0000 mg | ORAL_TABLET | Freq: Every day | ORAL | Status: AC

## 2013-08-20 MED ORDER — AZITHROMYCIN 250 MG PO TABS
500.0000 mg | ORAL_TABLET | Freq: Once | ORAL | Status: AC
  Administered 2013-08-20: 500 mg via ORAL
  Filled 2013-08-20: qty 2

## 2013-08-20 NOTE — ED Notes (Addendum)
Pt reports, cramping in right leg, both arms and under axilla bilaterally x 6 months. Pt Alert and Oriented x4. NAD at this time. Pt on 4L O2 at home.

## 2013-08-20 NOTE — ED Provider Notes (Signed)
CSN: [id number]     Arrival date & time 08/20/13  2110 History   First MD Initiated Contact with Patient 08/20/13 2116     Chief Complaint  Patient presents with  . Extremity Pain    HPI  Patient presents with concern of diffuse discomfort.  He describes a crampy sensation in all extremities.  He has no focal"pain",  And there is no chest pain, lightheadedness, syncope. He does have chronic weakness, chronic oxygen dependency.  He notes that this is unchanged.  He denies fever. No relief with anything as far today. Symptoms have been present for 24 hours. No clear exacerbating factors. Patient states that he takes all medication as directed. He takes Plavix.   Past Medical History  Diagnosis Date  . COPD (chronic obstructive pulmonary disease)   . CAD (coronary artery disease)   . HTN (hypertension)   . PUD (peptic ulcer disease)   . Syncope 07/25/11    x 2 today  . Cerebral atherosclerosis 03/22/13    carotid duplex- compred to prior study there is marked increase in the bilateral ICA velocities.   . Claudication 05/18/12    lower arterial doppler-there is mild significant changes noted when compared to the previous study dated in 2007  . Carotid artery occlusion    Past Surgical History  Procedure Laterality Date  . Cataract extraction    . Gastrectomy    . Transurethral resection of prostate    . Coronary angioplasty with stent placement      stent in 2003   Family History  Problem Relation Age of Onset  . Hypertension    . Coronary artery disease    . Stroke Mother   . Heart attack Brother    History  Substance Use Topics  . Smoking status: Former Smoker -- 1.00 packs/day for 60 years    Types: Cigarettes    Quit date: 01/06/1997  . Smokeless tobacco: Never Used  . Alcohol Use: No     Comment: hx etoh abuse in past, daughter states quit over 20 years ago    Review of Systems  Constitutional:       Per HPI, otherwise negative  HENT:       Per HPI,  otherwise negative  Respiratory:       Per HPI, otherwise negative  Cardiovascular:       Per HPI, otherwise negative  Gastrointestinal: Negative for vomiting.  Endocrine:       Negative aside from HPI  Genitourinary:       Neg aside from HPI   Musculoskeletal: Positive for myalgias.  Skin: Negative.   Neurological: Positive for weakness. Negative for syncope and light-headedness.    Allergies  Review of patient's allergies indicates no known allergies.  Home Medications   Current Outpatient Rx  Name  Route  Sig  Dispense  Refill  . aspirin 81 MG chewable tablet   Oral   Chew 81 mg by mouth daily.         . Cholecalciferol (VITAMIN D) 2000 UNITS tablet   Oral   Take 2,000 Units by mouth daily.         . clopidogrel (PLAVIX) 75 MG tablet   Oral   Take 75 mg by mouth daily.          . ferrous sulfate 325 (65 FE) MG EC tablet   Oral   Take 325 mg by mouth 3 (three) times daily with meals.         .Marland Kitchen  lactose free nutrition (BOOST) LIQD   Oral   Take 237 mLs by mouth daily.         . metoprolol tartrate (LOPRESSOR) 25 MG tablet   Oral   Take 25 mg by mouth 2 (two) times daily.         . mirtazapine (REMERON) 30 MG tablet   Oral   Take 30 mg by mouth at bedtime.         Marland Kitchen omeprazole (PRILOSEC) 40 MG capsule   Oral   Take 40 mg by mouth daily.          . OXYGEN-HELIUM IN   Inhalation   Inhale into the lungs. 2 liters         . pravastatin (PRAVACHOL) 40 MG tablet   Oral   Take 40 mg by mouth every evening.          . temazepam (RESTORIL) 30 MG capsule   Oral   Take 30 mg by mouth at bedtime as needed. For sleep         . tiotropium (SPIRIVA) 18 MCG inhalation capsule   Inhalation   Place 18 mcg into inhaler and inhale 2 (two) times daily.          . Travoprost, BAK Free, (TRAVATAN) 0.004 % SOLN ophthalmic solution   Both Eyes   Place 1 drop into both eyes at bedtime.           BP 169/64  Pulse 81  Temp(Src) 99.2 F (37.3  C) (Oral)  SpO2 97% Physical Exam  Nursing note and vitals reviewed. Constitutional: He is oriented to person, place, and time. He appears cachectic. He has a sickly appearance. No distress.  Juarez in place - 2 L [place]  HENT:  Head: Normocephalic and atraumatic.  Eyes: Conjunctivae and EOM are normal.  Cardiovascular: Normal rate and regular rhythm.   Pulmonary/Chest: No stridor. Tachypnea noted. No respiratory distress. He has decreased breath sounds. He has no wheezes.  Abdominal: He exhibits no distension.  Musculoskeletal: He exhibits no edema.  Atrophy - no gross deformities  Neurological: He is alert and oriented to person, place, and time.  Skin: Skin is warm and dry.  Psychiatric: He has a normal mood and affect.    ED Course  Procedures (including critical care time) Labs Review Labs Reviewed  CBC WITH DIFFERENTIAL - Abnormal; Notable for the following:    WBC 11.8 (*)    RBC 3.67 (*)    Hemoglobin 11.1 (*)    HCT 35.2 (*)    RDW 16.2 (*)    Neutrophils Relative % 86 (*)    Neutro Abs 10.1 (*)    Lymphocytes Relative 7 (*)    All other components within normal limits  COMPREHENSIVE METABOLIC PANEL - Abnormal; Notable for the following:    BUN 24 (*)    Total Bilirubin <0.2 (*)    GFR calc non Af Amer 50 (*)    GFR calc Af Amer 58 (*)    All other components within normal limits  LIPASE, BLOOD  TROPONIN I  URINALYSIS, ROUTINE W REFLEX MICROSCOPIC  CG4 I-STAT (LACTIC ACID)   Imaging Review Dg Chest 2 View  08/20/2013   CLINICAL DATA:  Chest pain.  EXAM: CHEST  2 VIEW  COMPARISON:  09/22/2012  FINDINGS: Changes of COPD again demonstrated. Asymmetric opacity is now seen in the right perihilar region, suspicious for pneumonia. Left lung is clear. No evidence of pleural effusion. Heart size is  normal.  IMPRESSION: Right perihilar infiltrate, suspicious for pneumonia. Recommend short-term radiographic followup in several weeks to confirm resolution.  COPD.   Electronically  Signed   By: Myles Rosenthal M.D.   On: 08/20/2013 22:56    EKG Interpretation    Date/Time:  Saturday August 20 2013 22:24:23 EST Ventricular Rate:  85 PR Interval:  170 QRS Duration: 83 QT Interval:  313 QTC Calculation: 372 R Axis:   64 Text Interpretation:  Sinus rhythm Ventricular premature complex Repol abnrm, severe global ischemia (LM/MVD) Sinus rhythm Premature ventricular complexes diffuse ST changes -  No significant change since last tracing Abnormal ekg Confirmed by Gerhard Munch  MD ([id number]) on 08/20/2013 11:30:53 PM           O2- 99%[place], bnl - unchanged  Update: Patient appears calm.  I discussed all results, including XR e/o pna with the patient.  MDM  No diagnosis found. Patient presents with concern of diffuse crampy pain.  Notably the patient's COPD, is oxygen dependent at home.  Patient's evaluation here is notable for demonstration of pneumonia via x-ray.  Patient's EKG is abnormal, but unchanged, and absent any elevation in his troponin to more testing, there is low concern for occult ACS.  In addition, the patient continues to take Plavix as directed. Patient has no lactic acidosis, no fever, and there is low suspicion for bacteremia/sepsis.  Patient started on antibiotics, steroids, discharged to follow up with primary care physician.    Gerhard Munch, MD 08/20/13 [phone]

## 2013-08-20 NOTE — Discharge Instructions (Signed)
As discussed, your evaluation tonight has demonstrated the presence of pneumonia.  His appointments take all medication as directed, and be sure to follow up with your physician.  It is normal for symptoms to take several days to improve, but if you develop new, or concerning changes in your condition, be sure to return here for further evaluation and management.

## 2013-08-21 NOTE — ED Notes (Signed)
NAD at this time. Pt given d/c instructions and verbalized understanding.

## 2013-08-26 LAB — CBC
HCT: 37.5 % — ABNORMAL LOW (ref 39.0–52.0)
Hemoglobin: 12.3 g/dL — ABNORMAL LOW (ref 13.0–17.0)
MCH: 29.5 pg (ref 26.0–34.0)
MCHC: 32.8 g/dL (ref 30.0–36.0)
MCV: 89.9 fL (ref 78.0–100.0)
Platelets: 275 10*3/uL (ref 150–400)
RBC: 4.17 MIL/uL — ABNORMAL LOW (ref 4.22–5.81)
RDW: 15.9 % — ABNORMAL HIGH (ref 11.5–15.5)
WBC: 7.1 10*3/uL (ref 4.0–10.5)

## 2013-08-26 LAB — TSH: TSH: 1.15 u[IU]/mL (ref 0.350–4.500)

## 2013-08-26 LAB — APTT: aPTT: 30 seconds (ref 24–37)

## 2013-08-26 LAB — PROTIME-INR
INR: 0.87 (ref <1.50)
Prothrombin Time: 11.8 seconds (ref 11.6–15.2)

## 2013-08-29 ENCOUNTER — Encounter: Payer: Self-pay | Admitting: *Deleted

## 2013-09-01 ENCOUNTER — Encounter (HOSPITAL_COMMUNITY)
Admission: RE | Disposition: A | Discharge status: Home / Self Care | Payer: Self-pay | Source: Ambulatory Visit | Attending: Cardiovascular Disease

## 2013-09-01 ENCOUNTER — Ambulatory Visit (HOSPITAL_COMMUNITY)
Admission: RE | Admit: 2013-09-01 | Discharge: 2013-09-02 | Disposition: A | Discharge status: Home / Self Care | Payer: Medicare Other | Source: Ambulatory Visit | Attending: Cardiovascular Disease | Admitting: Cardiovascular Disease

## 2013-09-01 ENCOUNTER — Encounter (HOSPITAL_COMMUNITY): Payer: Self-pay | Admitting: *Deleted

## 2013-09-01 DIAGNOSIS — E785 Hyperlipidemia, unspecified: Secondary | ICD-10-CM | POA: Insufficient documentation

## 2013-09-01 DIAGNOSIS — Z9981 Dependence on supplemental oxygen: Secondary | ICD-10-CM | POA: Insufficient documentation

## 2013-09-01 DIAGNOSIS — J449 Chronic obstructive pulmonary disease, unspecified: Secondary | ICD-10-CM | POA: Insufficient documentation

## 2013-09-01 DIAGNOSIS — I739 Peripheral vascular disease, unspecified: Secondary | ICD-10-CM | POA: Diagnosis present

## 2013-09-01 DIAGNOSIS — I708 Atherosclerosis of other arteries: Secondary | ICD-10-CM | POA: Insufficient documentation

## 2013-09-01 DIAGNOSIS — I70219 Atherosclerosis of native arteries of extremities with intermittent claudication, unspecified extremity: Secondary | ICD-10-CM | POA: Insufficient documentation

## 2013-09-01 DIAGNOSIS — I472 Ventricular tachycardia, unspecified: Secondary | ICD-10-CM | POA: Insufficient documentation

## 2013-09-01 DIAGNOSIS — F329 Major depressive disorder, single episode, unspecified: Secondary | ICD-10-CM | POA: Insufficient documentation

## 2013-09-01 DIAGNOSIS — I129 Hypertensive chronic kidney disease with stage 1 through stage 4 chronic kidney disease, or unspecified chronic kidney disease: Secondary | ICD-10-CM | POA: Insufficient documentation

## 2013-09-01 DIAGNOSIS — Z01818 Encounter for other preprocedural examination: Secondary | ICD-10-CM

## 2013-09-01 DIAGNOSIS — I4729 Other ventricular tachycardia: Secondary | ICD-10-CM | POA: Insufficient documentation

## 2013-09-01 DIAGNOSIS — Z7902 Long term (current) use of antithrombotics/antiplatelets: Secondary | ICD-10-CM | POA: Insufficient documentation

## 2013-09-01 DIAGNOSIS — F3289 Other specified depressive episodes: Secondary | ICD-10-CM | POA: Insufficient documentation

## 2013-09-01 DIAGNOSIS — I6529 Occlusion and stenosis of unspecified carotid artery: Secondary | ICD-10-CM | POA: Insufficient documentation

## 2013-09-01 DIAGNOSIS — N183 Chronic kidney disease, stage 3 unspecified: Secondary | ICD-10-CM | POA: Insufficient documentation

## 2013-09-01 DIAGNOSIS — J9610 Chronic respiratory failure, unspecified whether with hypoxia or hypercapnia: Secondary | ICD-10-CM | POA: Insufficient documentation

## 2013-09-01 DIAGNOSIS — I6509 Occlusion and stenosis of unspecified vertebral artery: Secondary | ICD-10-CM | POA: Insufficient documentation

## 2013-09-01 DIAGNOSIS — I2510 Atherosclerotic heart disease of native coronary artery without angina pectoris: Secondary | ICD-10-CM | POA: Insufficient documentation

## 2013-09-01 DIAGNOSIS — J4489 Other specified chronic obstructive pulmonary disease: Secondary | ICD-10-CM | POA: Insufficient documentation

## 2013-09-01 DIAGNOSIS — Z87891 Personal history of nicotine dependence: Secondary | ICD-10-CM | POA: Insufficient documentation

## 2013-09-01 DIAGNOSIS — I771 Stricture of artery: Secondary | ICD-10-CM | POA: Insufficient documentation

## 2013-09-01 DIAGNOSIS — Z7982 Long term (current) use of aspirin: Secondary | ICD-10-CM | POA: Insufficient documentation

## 2013-09-01 DIAGNOSIS — I658 Occlusion and stenosis of other precerebral arteries: Secondary | ICD-10-CM | POA: Insufficient documentation

## 2013-09-01 HISTORY — DX: Chronic kidney disease, stage 3 (moderate): N18.3

## 2013-09-01 HISTORY — DX: Ventricular tachycardia: I47.2

## 2013-09-01 HISTORY — DX: Other ventricular tachycardia: I47.29

## 2013-09-01 HISTORY — DX: Hyperlipidemia, unspecified: E78.5

## 2013-09-01 HISTORY — DX: Peripheral vascular disease, unspecified: I73.9

## 2013-09-01 HISTORY — DX: Chronic kidney disease, stage 3 unspecified: N18.30

## 2013-09-01 HISTORY — PX: LOWER EXTREMITY ANGIOGRAM: SHX5508

## 2013-09-01 HISTORY — DX: Chronic respiratory failure, unspecified whether with hypoxia or hypercapnia: J96.10

## 2013-09-01 SURGERY — ANGIOGRAM, LOWER EXTREMITY
Anesthesia: LOCAL

## 2013-09-01 MED ORDER — CLOPIDOGREL BISULFATE 75 MG PO TABS: 75.0000 mg | ORAL_TABLET | Freq: Every day | ORAL | Status: DC

## 2013-09-01 MED ORDER — FENTANYL CITRATE 0.05 MG/ML IJ SOLN
INTRAMUSCULAR | Status: AC
  Filled 2013-09-01: qty 2

## 2013-09-01 MED ORDER — ONDANSETRON HCL 4 MG/2ML IJ SOLN: 4.0000 mg | Freq: Four times a day (QID) | INTRAMUSCULAR | Status: DC | PRN

## 2013-09-01 MED ORDER — MORPHINE SULFATE 2 MG/ML IJ SOLN: 2.0000 mg | INTRAMUSCULAR | Status: DC | PRN

## 2013-09-01 MED ORDER — FERROUS SULFATE 325 (65 FE) MG PO TBEC
325.0000 mg | DELAYED_RELEASE_TABLET | Freq: Three times a day (TID) | ORAL | Status: DC
  Administered 2013-09-01 – 2013-09-02 (×3): 325 mg via ORAL
  Filled 2013-09-01 (×6): qty 1

## 2013-09-01 MED ORDER — DIAZEPAM 5 MG PO TABS
ORAL_TABLET | ORAL | Status: AC
  Filled 2013-09-01: qty 1

## 2013-09-01 MED ORDER — ACETAMINOPHEN 325 MG PO TABS: 650.0000 mg | ORAL_TABLET | ORAL | Status: DC | PRN

## 2013-09-01 MED ORDER — METOPROLOL TARTRATE 12.5 MG HALF TABLET
12.5000 mg | ORAL_TABLET | Freq: Two times a day (BID) | ORAL | Status: DC
  Administered 2013-09-01 – 2013-09-02 (×2): 12.5 mg via ORAL
  Filled 2013-09-01 (×4): qty 1

## 2013-09-01 MED ORDER — MIDAZOLAM HCL 2 MG/2ML IJ SOLN
INTRAMUSCULAR | Status: AC
  Filled 2013-09-01: qty 2

## 2013-09-01 MED ORDER — SODIUM CHLORIDE 0.9 % IV SOLN
INTRAVENOUS | Status: DC
Start: 2013-09-02 — End: 2013-09-01
  Administered 2013-09-01: 75 mL/h via INTRAVENOUS

## 2013-09-01 MED ORDER — CLOPIDOGREL BISULFATE 75 MG PO TABS
75.0000 mg | ORAL_TABLET | Freq: Every day | ORAL | Status: DC
  Administered 2013-09-02: 75 mg via ORAL
  Filled 2013-09-01: qty 1

## 2013-09-01 MED ORDER — DIAZEPAM 5 MG PO TABS
5.0000 mg | ORAL_TABLET | ORAL | Status: AC
  Administered 2013-09-01: 5 mg via ORAL

## 2013-09-01 MED ORDER — LIDOCAINE HCL (PF) 1 % IJ SOLN
INTRAMUSCULAR | Status: AC
  Filled 2013-09-01: qty 30

## 2013-09-01 MED ORDER — HEPARIN (PORCINE) IN NACL 2-0.9 UNIT/ML-% IJ SOLN
INTRAMUSCULAR | Status: AC
  Filled 2013-09-01: qty 1000

## 2013-09-01 MED ORDER — BUDESONIDE-FORMOTEROL FUMARATE 160-4.5 MCG/ACT IN AERO
2.0000 | INHALATION_SPRAY | Freq: Two times a day (BID) | RESPIRATORY_TRACT | Status: DC
  Administered 2013-09-01 – 2013-09-02 (×2): 2 via RESPIRATORY_TRACT
  Filled 2013-09-01: qty 6

## 2013-09-01 MED ORDER — ASPIRIN 81 MG PO CHEW
81.0000 mg | CHEWABLE_TABLET | ORAL | Status: AC
  Administered 2013-09-01: 81 mg via ORAL

## 2013-09-01 MED ORDER — TEMAZEPAM 15 MG PO CAPS: 30.0000 mg | ORAL_CAPSULE | Freq: Every day | ORAL | Status: DC

## 2013-09-01 MED ORDER — SODIUM CHLORIDE 0.9 % IJ SOLN: 3.0000 mL | INTRAMUSCULAR | Status: DC | PRN

## 2013-09-01 MED ORDER — SODIUM CHLORIDE 0.9 % IV SOLN: INTRAVENOUS | Status: AC

## 2013-09-01 MED ORDER — HYDRALAZINE HCL 20 MG/ML IJ SOLN: 10.0000 mg | INTRAMUSCULAR | Status: DC | PRN

## 2013-09-01 MED ORDER — MIRTAZAPINE 30 MG PO TABS
30.0000 mg | ORAL_TABLET | Freq: Every day | ORAL | Status: DC
  Administered 2013-09-01: 23:00:00 30 mg via ORAL
  Filled 2013-09-01 (×2): qty 1

## 2013-09-01 MED ORDER — ASPIRIN EC 81 MG PO TBEC: 81.0000 mg | DELAYED_RELEASE_TABLET | Freq: Every day | ORAL | Status: DC

## 2013-09-01 MED ORDER — ASPIRIN EC 325 MG PO TBEC
325.0000 mg | DELAYED_RELEASE_TABLET | Freq: Every day | ORAL | Status: DC
  Administered 2013-09-02: 11:00:00 325 mg via ORAL
  Filled 2013-09-01: qty 1

## 2013-09-01 MED ORDER — ASPIRIN 81 MG PO CHEW
CHEWABLE_TABLET | ORAL | Status: AC
  Administered 2013-09-01: 10:00:00 81 mg via ORAL
  Filled 2013-09-01: qty 1

## 2013-09-01 MED ORDER — PANTOPRAZOLE SODIUM 40 MG PO TBEC
40.0000 mg | DELAYED_RELEASE_TABLET | Freq: Every day | ORAL | Status: DC
  Administered 2013-09-02: 40 mg via ORAL
  Filled 2013-09-01: qty 1

## 2013-09-01 NOTE — Interval H&P Note (Signed)
History and Physical Interval Note:  09/01/2013 10:59 AM  Alec Walker  has presented today for surgery, with the diagnosis of claudication  The various methods of treatment have been discussed with the patient and family. After consideration of risks, benefits and other options for treatment, the patient has consented to  Procedure(s): LOWER EXTREMITY ANGIOGRAM (N/A) as a surgical intervention .  The patient's history has been reviewed, patient examined, no change in status, stable for surgery.  I have reviewed the patient's chart and labs.  Questions were answered to the patient's satisfaction.     Runell GessBERRY,Carylon Tamburro J

## 2013-09-01 NOTE — H&P (View-Only) (Signed)
08/04/2013 Alec Walker   Sep 25, 1926  [id number]  Primary Physician Eartha Inch, MD Primary Cardiologist: Runell Gess MD [name]   HPI:  Alec Walker has been followed by Dr. Alanda Amass apparently for over 20 years. Normally, he was found to have mild coronary artery disease with 40% LAD stenosis, 40% diagonal stenosis and 40% RCA stenosis by cardiac catheterization which was done in 1999 at [facility]. He has documented peripheral vascular disease and has documented 90% vertebral artery narrowing as well as 40% subclavian artery narrowing. In 1999 he underwent intervention with stenting of his right common iliac artery. He has a history of documented carotid disease. He apparently underwent carotid Doppler study which had been ordered by Dr. Alanda Amass in August 2014 which showed 50-69% diameter reduction bilaterally with velocities in the upper range of the spectrum. He doesn't admit to claudication symptoms to his leg. He does note more cramping in the back of his legs. His last lower LE Doppler study was done in October 2013 which showed normal ABIs. He had distal taper of his aorta. His right common iliac stent had elevated velocities suggesting a 50-69% diameter reduction. The right common femoral artery had 50-69% stenosis. The right peroneal seem to be occlusive. The proximal posterior tibial artery on the right suggesting 50-69% reduction. The left common iliac artery suggested narrowing less than 50%. The left common femoral had 50-69% diameter reduction in the left anterior tibial also appear to be occlusive with reconstitution noted. He states that at times he has noted some episodes of dizziness. He did have antegrade flow in both vertebrals on his most recent carotid study. He does have 50-69% diameter reduction on the study of the left subclavian.  Additional problems include hyperlipidemia for which he's been taking Pravachol. Depression for which  he takes mirtazapine. COPD for which he has been on Spiriva the he is on nocturnal oxygen at night at 2 L. He is status post peptic ulcer disease surgery by Dr. Orpah Greek in 1976.  Review of his chart indicates that he did have a nuclear perfusion study in September 2013 which suggested diaphragmatic attenuation inferiorly. Post-stress ejection fraction was 62%. His last echo Doppler study was 2 years ago which showed LVH.  Your recent arterial Dopplers that showed progression of disease in his common iliac and common femoral arteries. He's had progressive left lower leg claudication as well. He's also had progression of disease in his internal carotid arteries bilaterally and remains neurologically asymptomatic.    Current Outpatient Prescriptions  Medication Sig Dispense Refill  . aspirin 81 MG chewable tablet Chew 81 mg by mouth daily.      . Brinzolamide-Brimonidine (SIMBRINZA) 1-0.2 % SUSP Place 1 drop into both eyes 2 (two) times daily.       . Cholecalciferol (VITAMIN D) 2000 UNITS tablet Take 2,000 Units by mouth daily.      . clopidogrel (PLAVIX) 75 MG tablet Take 75 mg by mouth daily.       Marland Kitchen lactose free nutrition (BOOST) LIQD Take 237 mLs by mouth daily.      Marland Kitchen omeprazole (PRILOSEC) 40 MG capsule Take 40 mg by mouth daily.       Docia Barrier IN Inhale into the lungs. 2 liters      . pravastatin (PRAVACHOL) 40 MG tablet Take 40 mg by mouth every evening.       . temazepam (RESTORIL) 30 MG capsule Take 30 mg by mouth at bedtime as needed.  For sleep      . tiotropium (SPIRIVA) 18 MCG inhalation capsule Place 18 mcg into inhaler and inhale 2 (two) times daily.       . Travoprost, BAK Free, (TRAVATAN) 0.004 % SOLN ophthalmic solution Place 1 drop into both eyes at bedtime.        No current facility-administered medications for this visit.    No Known Allergies  History   Social History  . Marital Status: Married    Spouse Name: N/A    Number of Children: 3  . Years of  Education: N/A   Occupational History  . retired     Social History Main Topics  . Smoking status: Former Smoker -- 1.00 packs/day for 60 years    Types: Cigarettes    Quit date: 01/06/1997  . Smokeless tobacco: Never Used  . Alcohol Use: No     Comment: hx etoh abuse in past, daughter states quit over 20 years ago  . Drug Use: No  . Sexual Activity: No   Other Topics Concern  . Not on file   Social History Narrative  . No narrative on file     Review of Systems: General: negative for chills, fever, night sweats or weight changes.  Cardiovascular: negative for chest pain, dyspnea on exertion, edema, orthopnea, palpitations, paroxysmal nocturnal dyspnea or shortness of breath Dermatological: negative for rash Respiratory: negative for cough or wheezing Urologic: negative for hematuria Abdominal: negative for nausea, vomiting, diarrhea, bright red blood per rectum, melena, or hematemesis Neurologic: negative for visual changes, syncope, or dizziness All other systems reviewed and are otherwise negative except as noted above.    Blood pressure 150/80, pulse 80, height 5\' 7"  (1.702 m), weight 117 lb (53.071 kg).  General appearance: alert and no distress Neck: no adenopathy, no JVD, supple, symmetrical, trachea midline, thyroid not enlarged, symmetric, no tenderness/mass/nodules and loud bilateral carotid bruits Lungs: clear to auscultation bilaterally Heart: regular rate and rhythm, S1, S2 normal, no murmur, click, rub or gallop Extremities: femoral arteries are 2+ with bruits bilaterally  EKG not performed today  ASSESSMENT AND PLAN:   PVD (peripheral vascular disease) The patient was referred to me by Dr. Daphene Jaegerom Kelly for symptomatic claudication and known peripheral arterial disease. He was previously a patient of Dr. Arline AspJohn Grubbs who performed right common iliac artery stenting 12/19/97 document documented bilateral common femoral artery disease as well. Since his prior  Doppler severe ago he's had progression of disease in his common femoral arteries with medical patient of Dr. Last several months. It is agreed to perform angiography and potential intervention.  Carotid artery disease Followed by duplex ultrasound was recently done in August revealing a marked increase in his bilateral internal carotid artery velocities. We'll continue to follow this on a every 6 month basis.      Runell GessJonathan J. Charlise Giovanetti MD FACP,FACC,FAHA, [facility] 08/04/2013 10:44 AM

## 2013-09-01 NOTE — CV Procedure (Signed)
Alec HorsfallJohnie E Walker is a 80 y.o. male    [id number] LOCATION:  FACILITY: [facility]  PHYSICIAN: Nanetta BattyJonathan Arafat Cocuzza, M.D. 12-10-1926   DATE OF PROCEDURE:  09/01/2013  DATE OF DISCHARGE:     PV Angiogram/Intervention    History obtained from chart review.Alec Walker has been followed by Dr. Alanda AmassWeintraub apparently for over 20 years. Normally, he was found to have mild coronary artery disease with 40% LAD stenosis, 40% diagonal stenosis and 40% RCA stenosis by cardiac catheter[facility]. He has documented peripheral vascular disease and has documented 90% vertebral artery narrowing as well as 40% subclavian artery narrowing. In 1999 he underwent intervention with stenting of his right common iliac artery. He has a history of documented carotid disease. He apparently underwent carotid Doppler study which had been ordered by Dr. Alanda AmassWeintraub in August 2014 which showed 50-69% diameter reduction bilaterally with velocities in the upper range of the spectrum. He doesn't admit to claudication symptoms to his leg. He does note more cramping in the back of his legs. His last lower LE Doppler study was done in October 2013 which showed normal ABIs. He had distal taper of his aorta. His right common iliac stent had elevated velocities suggesting a 50-69% diameter reduction. The right common femoral artery had 50-69% stenosis. The right peroneal seem to be occlusive. The proximal posterior tibial artery on the right suggesting 50-69% reduction. The left common iliac artery suggested narrowing less than 50%. The left common femoral had 50-69% diameter reduction in the left anterior tibial also appear to be occlusive with reconstitution noted. He states that at times he has noted some episodes of dizziness. He did have antegrade flow in both vertebrals on his most recent carotid study. He does have 50-69% diameter reduction on the study of the left subclavian.  Additional problems include  hyperlipidemia for which he's been taking Pravachol. Depression for which he takes mirtazapine. COPD for which he has been on Spiriva the he is on nocturnal oxygen at night at 2 L. He is status post peptic ulcer disease surgery by Dr. Orpah GreekAbrams in 1976.  Review of his chart indicates that he did have a nuclear perfusion study in September 2013 which suggested diaphragmatic attenuation inferiorly. Post-stress ejection fraction was 62%. His last echo Doppler study was 2 years ago which showed LVH.  His recent arterial Dopplers showed progression of disease in his common iliac and common femoral arteries. He's had progressive left lower leg claudication as well. He's also had progression of disease in his internal carotid arteries bilaterally and remains neurologically asymptomatic.    PROCEDURE DESCRIPTION:   The patient was brought to the second floor [place] [facility] cath lab in the postabsorptive state. He was premedicated with Valium 5 mg by mouth, IV Versed and fentanyl. His right groin was prepped and shaved in usual sterile fashion. Xylocaine 1% was used for local anesthesia. A 5 French sheath was inserted into the right common femoral artery using standard Seldinger technique.a 5 French pigtail catheter along with a short 4 French endhole catheter were used for distal abdominal aortography, bilateral iliac angiography and selective distal right and left common femoral artery angiography. Contralateral access was obtained with a crossover catheter, 035 Glidewire and 4 French endhole catheter. Visipaque dye wishes for the entirety of the case (118 cc delivered the patient).retrograde aortic pressure was monitored during the case.   HEMODYNAMICS:    AO SYSTOLIC/AO DIASTOLIC:  140/70   Angiographic Data:  1: Abdominal aorta-highly calcified fluoroscopically distal palmar aorta without obvious stenosis or aneurysmal dilatation  2: Left lower extremity-50-60% ostial hypodense left common iliac  artery stenosis. It was a 90-95% calcified distal left common femoral artery stenosis just before the profunda SFA bifurcation.   3: Right lower extremity-the ostial right common iliac artery stent appeared widely patent there did appear to be a 70% hypodense lesion at its origin. There was a 95% eccentric calcific, exophytic plaque at the origin of the right external iliac artery. There is a 95% calcific plaque in the right common femoral artery.  IMPRESSION:Alec Walker has high-grade calcified by a bilateral common femoral artery stenoses that she would endarterectomy and patch angioplasty. He does have a high-grade right external iliac artery lesion which is calcified and is treated with diamondback orbital rotational arthrectomy, PT and stenting. Because of his chronic renal insufficiency with a creatinine clearance in the 30 range and the fact that he received 100 cc of contrast, I'm going to keep him overnight and hydrate him, check his renal function in the morning after which she'll discharge him and follow up with me in one to 2 weeks.   Alec Gess MD, [facility] 09/01/2013 11:51 AM

## 2013-09-02 ENCOUNTER — Encounter (HOSPITAL_COMMUNITY): Payer: Self-pay | Admitting: Physician Assistant

## 2013-09-02 ENCOUNTER — Other Ambulatory Visit: Payer: Self-pay | Admitting: Physician Assistant

## 2013-09-02 DIAGNOSIS — I739 Peripheral vascular disease, unspecified: Principal | ICD-10-CM

## 2013-09-02 DIAGNOSIS — N183 Chronic kidney disease, stage 3 unspecified: Secondary | ICD-10-CM

## 2013-09-02 DIAGNOSIS — I779 Disorder of arteries and arterioles, unspecified: Secondary | ICD-10-CM

## 2013-09-02 LAB — BASIC METABOLIC PANEL
BUN: 19 mg/dL (ref 6–23)
CHLORIDE: 111 meq/L (ref 96–112)
CO2: 23 meq/L (ref 19–32)
CREATININE: 1.35 mg/dL (ref 0.50–1.35)
Calcium: 8.8 mg/dL (ref 8.4–10.5)
GFR calc Af Amer: 54 mL/min — ABNORMAL LOW (ref >90)
GFR calc non Af Amer: 47 mL/min — ABNORMAL LOW (ref >90)
Glucose, Bld: 89 mg/dL (ref 70–99)
POTASSIUM: 4.6 meq/L (ref 3.7–5.3)
Sodium: 145 mEq/L (ref 137–147)

## 2013-09-02 LAB — CBC
HCT: 34.9 % — ABNORMAL LOW (ref 39.0–52.0)
Hemoglobin: 11 g/dL — ABNORMAL LOW (ref 13.0–17.0)
MCH: 30.3 pg (ref 26.0–34.0)
MCHC: 31.5 g/dL (ref 30.0–36.0)
MCV: 96.1 fL (ref 78.0–100.0)
PLATELETS: 168 10*3/uL (ref 150–400)
RBC: 3.63 MIL/uL — AB (ref 4.22–5.81)
RDW: 16.5 % — AB (ref 11.5–15.5)
WBC: 5.6 10*3/uL (ref 4.0–10.5)

## 2013-09-02 LAB — MAGNESIUM: MAGNESIUM: 1.7 mg/dL (ref 1.5–2.5)

## 2013-09-02 MED ORDER — PANTOPRAZOLE SODIUM 40 MG PO TBEC: 80.0000 mg | DELAYED_RELEASE_TABLET | Freq: Every day | ORAL | Status: DC

## 2013-09-02 NOTE — Progress Notes (Signed)
    Subjective:  The patient is doing well this morning. He has no chest pain. His groin site feels fine. He denies leg pain. His dyspnea is at baseline. Note he is on 24/7 home oxygen  Objective:  Vital Signs in the last 24 hours: Temp:  [97.5 F (36.4 C)-98.6 F (37 C)] 98.3 F (36.8 C) (01/16 0752) Pulse Rate:  [59-84] 70 (01/16 0752) Resp:  [16-20] 18 (01/16 0752) BP: (106-169)/(40-76) 138/66 mmHg (01/16 0752) SpO2:  [95 %-98 %] 97 % (01/16 0752) Weight:  [117 lb (53.071 kg)] 117 lb (53.071 kg) (01/15 0938)  Intake/Output from previous day: 01/15 0701 - 01/16 0700 In: 220 [P.O.:220] Out: 400 [Urine:400]  Physical Exam: Pt is alert and oriented, elderly man in NAD HEENT: normal Neck: JVP - normal Lungs: Diminished bilaterally CV: RRR without murmur or gallop, distant Abd: soft, NT, Positive BS, no hepatomegaly Ext: no C/C/E, posterior tibial pulses 2+ on the left and trace on the right, right groin site is clear with no evidence of hematoma or ecchymosis. Skin: warm/dry no rash   Lab Results:  Recent Labs  09/02/13 0349  WBC 5.6  HGB 11.0*  PLT 168    Recent Labs  09/02/13 0349  NA 145  K 4.6  CL 111  CO2 23  GLUCOSE 89  BUN 19  CREATININE 1.35   No results found for this basename: TROPONINI, CK, MB,  in the last 72 hours  Assessment/Plan:  Peripheral arterial disease with intermittent claudication. The patient is status post lower extremity angiography yesterday. He is stable for discharge today. He will followup with Dr. Allyson SabalBerry in one to 2 weeks for further management. Angiography reviewed and he was noted to have bilateral common femoral artery stenoses. No medication changes were made during his hospitalization.  Tonny BollmanMichael Jenan Ellegood, M.D. 09/02/2013, 9:05 AM

## 2013-09-02 NOTE — Progress Notes (Signed)
Patient ambulating in hall with steady gait independently. Tolerating activity well with no s/s intolerance noted. Ambulates with a slow steady gait at his own pace, in short distances.

## 2013-09-02 NOTE — Progress Notes (Signed)
Tele showed 6 beats Vtach, pt dozing, awakened easily, asymptomatic.  Dayna Dunn PA notified, order received for Magnesium to be drawn.

## 2013-09-02 NOTE — Discharge Summary (Signed)
Discharge Summary   Patient ID: Alec Walker MRN: [id number], DOB/AGE: 1927-08-08 80 y.o. Admit date: 09/01/2013 D/C date:     09/02/2013  Primary Care Provider: Eartha Inch, MD Primary Cardiologist: Candace GallusAllyson Sabal  Primary Discharge Diagnoses:  1. PAD with claudication - s/p diagnostic PV angio this admission, see below - history of prior stenting of RCIA 1999 - also has history of 90% vertebral artery narrowing, 40% subclavian artery narrowing 2. CKD stage III 3. Carotid artery disease - 50-69% BICA by duplex 03/2013, repeat needed 11/2013 4. NSVT, brief  Secondary Discharge Diagnoses:  1. CAD - nonobstructive CAD by cath 1999, nonischemic nuclear study 04/2012 2. HLD 3. Depression 4. COPD with chronic respiratory failure on home O2 5. PUD s/p surgery 1976 6. Syncope 2012 7. HTN  Hospital Course: Alec Walker is an 80 y/o M with history of nonobstructive CAD 1999, HLD, COPD on home O2, carotid artery disease (50-69% by dopp 03/2013), and PVD who presented to [facility] for [facility] angio to evaluate his claudication. Per admission note, he has prior documented 90% vertebral artery narrowing as well as 40% subclavian artery narrowing. In 1999 he underwent intervention with stenting of his right common iliac artery. The patient reported symptoms of claudication in the back of his legs. His last lower LE Doppler study was done in October 2013 which showed normal ABIs. He had distal taper of his aorta. His right common iliac stent had elevated velocities suggesting a 50-69% diameter reduction. The right common femoral artery had 50-69% stenosis. The right peroneal seem to be occlusive. The proximal posterior tibial artery on the right suggesting 50-69% reduction. The left common iliac artery suggested narrowing less than 50%. The left common femoral had 50-69% diameter reduction in the left anterior tibial also appear to be occlusive with reconstitution noted.   More recent  arterial dopplers showed progression of disease in his common iliac and common femoral arteries. To further evaluate, he was brought in yesterday for PV angio which demonstrated: 1: Abdominal aorta-highly calcified fluoroscopically distal palmar aorta without obvious stenosis or aneurysmal dilatation  2: Left lower extremity-50-60% ostial hypodense left common iliac artery stenosis. It was a 90-95% calcified distal left common femoral artery stenosis just before the profunda SFA bifurcation.  3: Right lower extremity-the ostial right common iliac artery stent appeared widely patent there did appear to be a 70% hypodense lesion at its origin. There was a 95% eccentric calcific, exophytic plaque at the origin of the right external iliac artery. There is a 95% calcific plaque in the right common femoral artery.  Dr. Allyson Sabal feels that he will ultimately require endarterectomy and patch angioplasty on his high-grade calcified bilateral common femoral artery stenoses. His calcified high-grade right external iliac artery lesion will need to be treated with diamondback orbital rotational arthrectomy, PT and stenting. No intervention was performed this admission due to the patient's CKD. The patient will follow up in the office to discuss further plans for intevention. Since chronic CrCl is in the 30 range and 100cc of contrast was used, the patient was observed overnight. Cr remained acceptable post-procedure. No med changes were made except substitution of Protonix for Omeprazole given chronic Plavix use. The patient had 6 beats of asymptomatic NSVT, potassium WNL. He remains on beta blocker therapy. Dr. Excell Seltzer has seen and examined the patient today and feels he is stable for discharge.  Dr. Hazle Coca secretary is working on scheduling his followup and will call the patient with his  appointment. I have also left a voicemail on their scheduling line requesting a BMET in 1 week to ensure stability of renal function  post-angio. I spoke with Alec Walker in the vascular dept to make sure his f/u carotid duplex is scheduled for 11/2013 who instructed to me to place the order as a future order in Epic and she will assist with arranging.   Discharge Vitals: Blood pressure 138/66, pulse 70, temperature 98.3 F (36.8 C), temperature source Oral, resp. rate 18, height 5\' 7"  (1.702 m), weight 117 lb (53.071 kg), SpO2 97.00%.  Labs: Lab Results  Component Value Date   WBC 5.6 09/02/2013   HGB 11.0* 09/02/2013   HCT 34.9* 09/02/2013   MCV 96.1 09/02/2013   PLT 168 09/02/2013     Recent Labs Lab 09/02/13 0349  NA 145  K 4.6  CL 111  CO2 23  BUN 19  CREATININE 1.35  CALCIUM 8.8  GLUCOSE 89    Lab Results  Component Value Date   CHOL 160 06/17/2013   HDL 65 06/17/2013   LDLCALC 65 06/17/2013   TRIG 149 06/17/2013    Diagnostic Studies/Procedures   PV angio this admission. See full report and above for summary.    Discharge Medications     Medication List    STOP taking these medications       omeprazole 40 MG capsule  Commonly known as:  PRILOSEC  Replaced by:  pantoprazole 40 MG tablet      TAKE these medications       aspirin EC 81 MG tablet  Take 81 mg by mouth at bedtime.     budesonide-formoterol 160-4.5 MCG/ACT inhaler  Commonly known as:  SYMBICORT  Inhale 2 puffs into the lungs 2 (two) times daily.     clopidogrel 75 MG tablet  Commonly known as:  PLAVIX  Take 75 mg by mouth daily.     ferrous sulfate 325 (65 FE) MG EC tablet  Take 325 mg by mouth 3 (three) times daily with meals.     metoprolol tartrate 25 MG tablet  Commonly known as:  LOPRESSOR  Take 12.5 mg by mouth 2 (two) times daily.     mirtazapine 30 MG tablet  Commonly known as:  REMERON  Take 30 mg by mouth at bedtime.     OXYGEN-HELIUM IN  Inhale into the lungs. 2 liters     pantoprazole 40 MG tablet  Commonly known as:  PROTONIX  Take 2 tablets (80 mg total) by mouth daily.     pravastatin 40 MG  tablet  Commonly known as:  PRAVACHOL  Take 40 mg by mouth at bedtime.     temazepam 30 MG capsule  Commonly known as:  RESTORIL  Take 30 mg by mouth at bedtime. For sleep     Vitamin D 2000 UNITS tablet  Take 2,000 Units by mouth at bedtime.        Disposition   The patient will be discharged in stable condition to home. Discharge Orders   Future Appointments Provider Department Dept Phone   09/20/2013 9:30 AM Runell Gess, MD [facility] [phone]   Future Orders Complete By Expires   Diet - low sodium heart healthy  As directed    Discharge instructions  As directed    Comments:     Some studies suggest Prilosec (omeprazole) can interfere with Plavix. We have changed Omeprazole to Protonix. 80mg  of Protonix is equal to 40mg  of Omeprazole, so you'll notice  the dose has changed.   Increase activity slowly  As directed    Scheduling Instructions:     No driving for 2 days. No lifting over 5 lbs for 1 week. No sexual activity for 1 week. Keep procedure site clean & dry. If you notice increased pain, swelling, bleeding or pus, call/return!  You may shower, but no soaking baths/hot tubs/pools for 1 week.     Follow-up Information   Follow up with Runell GessBERRY,JONATHAN J, MD. (Dr. Hazle CocaBerry's office will call you for a follow-up appointment. Please call the office if you have not heard from us within 3 days.)    Specialty:  Cardiology   Contact information:   [address] [place] [address] [phone]         Duration of Discharge Encounter: Greater than 30 minutes including physician and PA time.  Signed, Ronie Spiesayna Dunn PA-C 09/02/2013, 10:35 AM

## 2013-09-05 ENCOUNTER — Other Ambulatory Visit: Payer: Self-pay | Admitting: *Deleted

## 2013-09-14 ENCOUNTER — Telehealth: Payer: Self-pay | Admitting: Cardiovascular Disease

## 2013-09-14 NOTE — Telephone Encounter (Signed)
Please call her today- he need prior authorization for his Protonix please.

## 2013-09-14 NOTE — Telephone Encounter (Signed)
Returned call and pt verified x 2 w/ pt's wife, Lucendia HerrlichFaye.  Informed message received.  Asked if pharmacy gave her a number for PA.  Stated she didn't know and they told her they faxed Dr. Hazle CocaBerry's office.  Wife informed if faxed, PA may take at least 24 hrs for response from insurance company.  Informed Samara DeistKathryn, Dr. Hazle CocaBerry's nurse, will be notified to look out for fax.  Verbalized understanding.  Message forwarded to Samara DeistKathryn, [place].

## 2013-09-16 ENCOUNTER — Telehealth (HOSPITAL_COMMUNITY): Payer: Self-pay | Admitting: *Deleted

## 2013-09-20 ENCOUNTER — Ambulatory Visit (INDEPENDENT_AMBULATORY_CARE_PROVIDER_SITE_OTHER): Payer: Medicare Other | Admitting: Cardiovascular Disease

## 2013-09-20 ENCOUNTER — Encounter: Payer: Self-pay | Admitting: Cardiovascular Disease

## 2013-09-20 ENCOUNTER — Telehealth: Payer: Self-pay | Admitting: Cardiovascular Disease

## 2013-09-20 VITALS — BP 126/78 | HR 66 | Ht 67.0 in | Wt 119.0 lb

## 2013-09-20 DIAGNOSIS — I739 Peripheral vascular disease, unspecified: Secondary | ICD-10-CM

## 2013-09-20 DIAGNOSIS — Z01818 Encounter for other preprocedural examination: Secondary | ICD-10-CM

## 2013-09-20 NOTE — Assessment & Plan Note (Signed)
I performed peripheral angiography on him 09/01/13 revealing high-grade 90-95% calcified bilateral common femoral artery stenoses. He also had a 70% stenosis with calcified in his right external iliac artery as well as a patent stent with what appeared to be a 70% ostial stenosis. My plan is to have Dr. Myra GianottiBrabham do bilateral common femoral endarterectomies after which I will assess him clinically and most likely bring him back to perform diamondback orbital rotation arthrectomy.

## 2013-09-20 NOTE — Patient Instructions (Signed)
  We will see you back in follow up in 3 months with Dr Allyson SabalBerry.  Dr Allyson SabalBerry has ordered a lexiscan myoview.  Dr Allyson SabalBerry has referred you to Dr Myra GianottiBrabham for vascular surgery on your legs.

## 2013-09-20 NOTE — Telephone Encounter (Signed)
Faxed referral to dr. Myra Gianottibrabham

## 2013-09-20 NOTE — Progress Notes (Signed)
09/20/2013 Alec Walker   1927/01/24  [id number]  Primary Physician Eartha Inch, MD Primary Cardiologist: Runell Gess MD [name]   HPI:  Alec Walker has been followed by Dr. Alanda Amass apparently for over 20 years. Normally, he was found to have mild coronary artery disease with 40% LAD stenosis, 40% diagonal stenosis and 40% RCA stenosis by cardiac catheterization which was done in 1999 at [facility]. He has documented peripheral vascular disease and has documented 90% vertebral artery narrowing as well as 40% subclavian artery narrowing. In 1999 he underwent intervention with stenting of his right common iliac artery. He has a history of documented carotid disease. He apparently underwent carotid Doppler study which had been ordered by Dr. Alanda Amass in August 2014 which showed 50-69% diameter reduction bilaterally with velocities in the upper range of the spectrum. He doesn't admit to claudication symptoms to his leg. He does note more cramping in the back of his legs. His last lower LE Doppler study was done in October 2013 which showed normal ABIs. He had distal taper of his aorta. His right common iliac stent had elevated velocities suggesting a 50-69% diameter reduction. The right common femoral artery had 50-69% stenosis. The right peroneal seem to be occlusive. The proximal posterior tibial artery on the right suggesting 50-69% reduction. The left common iliac artery suggested narrowing less than 50%. The left common femoral had 50-69% diameter reduction in the left anterior tibial also appear to be occlusive with reconstitution noted. He states that at times he has noted some episodes of dizziness. He did have antegrade flow in both vertebrals on his most recent carotid study. He does have 50-69% diameter reduction on the study of the left subclavian.  Additional problems include hyperlipidemia for which he's been taking Pravachol. Depression for which he  takes mirtazapine. COPD for which he has been on Spiriva the he is on nocturnal oxygen at night at 2 L. He is status post peptic ulcer disease surgery by Dr. Orpah Greek in 1976.  Review of his chart indicates that he did have a nuclear perfusion study in September 2013 which suggested diaphragmatic attenuation inferiorly. Post-stress ejection fraction was 62%. His last echo Doppler study was 2 years ago which showed LVH.  His recent arterial Dopplers that showed progression of disease in his common iliac and common femoral arteries. He's had progressive left lower leg claudication as well. He's also had progression of disease in his internal carotid arteries bilaterally and remains neurologically asymptomatic. I performed a peripheral angiography on him 09/01/13 revealing 95% calcified bilateral common femoral artery stenoses. A 95% right external iliac stenosis as well as a 70% ostial right common iliac artery stenosis within the previously placed stent. He does have orthopnea and claudication. I will do arrange for him to see Dr. Myra Gianotti for bilateral common femoral endarterectomies followed by potential staged diamondback orbital rotational atherectomy of his right external iliac artery. Prior to this I'm going to obtain a pharmacologic Myoview stress test to risk stratify him he does complain of chest pain fairly frequently..    Current Outpatient Prescriptions  Medication Sig Dispense Refill  . aspirin EC 81 MG tablet Take 81 mg by mouth at bedtime.      Marland Kitchen azithromycin (ZITHROMAX) 250 MG tablet       . budesonide-formoterol (SYMBICORT) 160-4.5 MCG/ACT inhaler Inhale 2 puffs into the lungs 2 (two) times daily.      . Cholecalciferol (VITAMIN D) 2000 UNITS tablet Take 2,000 Units  by mouth at bedtime.       . clopidogrel (PLAVIX) 75 MG tablet Take 75 mg by mouth daily.       . ferrous sulfate 325 (65 FE) MG EC tablet Take 325 mg by mouth 3 (three) times daily with meals.      . metoprolol tartrate  (LOPRESSOR) 25 MG tablet Take 12.5 mg by mouth 2 (two) times daily.       . mirtazapine (REMERON) 30 MG tablet Take 30 mg by mouth at bedtime.      Marland Kitchen. omeprazole (PRILOSEC) 40 MG capsule       . OXYGEN-HELIUM IN Inhale into the lungs. 2 liters      . pantoprazole (PROTONIX) 40 MG tablet Take 2 tablets (80 mg total) by mouth daily.  60 tablet  1  . pravastatin (PRAVACHOL) 40 MG tablet Take 40 mg by mouth at bedtime.       . predniSONE (DELTASONE) 20 MG tablet       . temazepam (RESTORIL) 30 MG capsule Take 30 mg by mouth at bedtime. For sleep       No current facility-administered medications for this visit.    No Known Allergies  History   Social History  . Marital Status: Married    Spouse Name: N/A    Number of Children: 3  . Years of Education: N/A   Occupational History  . retired     Social History Main Topics  . Smoking status: Former Smoker -- 1.00 packs/day for 60 years    Types: Cigarettes    Quit date: 01/06/1997  . Smokeless tobacco: Never Used  . Alcohol Use: No     Comment: hx etoh abuse in past, daughter states quit over 20 years ago  . Drug Use: No  . Sexual Activity: No   Other Topics Concern  . Not on file   Social History Narrative  . No narrative on file     Review of Systems: General: negative for chills, fever, night sweats or weight changes.  Cardiovascular: negative for chest pain, dyspnea on exertion, edema, orthopnea, palpitations, paroxysmal nocturnal dyspnea or shortness of breath Dermatological: negative for rash Respiratory: negative for cough or wheezing Urologic: negative for hematuria Abdominal: negative for nausea, vomiting, diarrhea, bright red blood per rectum, melena, or hematemesis Neurologic: negative for visual changes, syncope, or dizziness All other systems reviewed and are otherwise negative except as noted above.    Blood pressure 126/78, pulse 66, height 5\' 7"  (1.702 m), weight 119 lb (53.978 kg).  General appearance:  alert and no distress Neck: no adenopathy, no JVD, supple, symmetrical, trachea midline, thyroid not enlarged, symmetric, no tenderness/mass/nodules and bilateral carotid bruits Lungs: clear to auscultation bilaterally Heart: regular rate and rhythm, S1, S2 normal, no murmur, click, rub or gallop Extremities: extremities normal, atraumatic, no cyanosis or edema and the right common femoral arterial puncture site was well-healed  EKG not performed today  ASSESSMENT AND PLAN:   PVD (peripheral vascular disease) I performed peripheral angiography on him 09/01/13 revealing high-grade 90-95% calcified bilateral common femoral artery stenoses. He also had a 70% stenosis with calcified in his right external iliac artery as well as a patent stent with what appeared to be a 70% ostial stenosis. My plan is to have Dr. Myra GianottiBrabham do bilateral common femoral endarterectomies after which I will assess him clinically and most likely bring him back to perform diamondback orbital rotation arthrectomy.      Runell GessJonathan J. Berry MD FACP,FACC,FAHA,  FSCAI 09/20/2013 10:53 AM

## 2013-09-21 NOTE — Telephone Encounter (Signed)
PA has been filled out.  Awaiting results

## 2013-09-27 ENCOUNTER — Ambulatory Visit (HOSPITAL_COMMUNITY)
Admission: RE | Admit: 2013-09-27 | Discharge: 2013-09-27 | Disposition: A | Discharge status: Home / Self Care | Payer: Medicare Other | Source: Ambulatory Visit | Attending: Cardiovascular Disease | Admitting: Cardiovascular Disease

## 2013-09-27 ENCOUNTER — Telehealth: Payer: Self-pay | Admitting: Cardiovascular Disease

## 2013-09-27 DIAGNOSIS — I10 Essential (primary) hypertension: Secondary | ICD-10-CM | POA: Insufficient documentation

## 2013-09-27 DIAGNOSIS — Z01810 Encounter for preprocedural cardiovascular examination: Secondary | ICD-10-CM

## 2013-09-27 DIAGNOSIS — R55 Syncope and collapse: Secondary | ICD-10-CM | POA: Insufficient documentation

## 2013-09-27 DIAGNOSIS — R079 Chest pain, unspecified: Secondary | ICD-10-CM | POA: Insufficient documentation

## 2013-09-27 DIAGNOSIS — J449 Chronic obstructive pulmonary disease, unspecified: Secondary | ICD-10-CM | POA: Insufficient documentation

## 2013-09-27 DIAGNOSIS — Z01818 Encounter for other preprocedural examination: Secondary | ICD-10-CM

## 2013-09-27 DIAGNOSIS — R5383 Other fatigue: Secondary | ICD-10-CM

## 2013-09-27 DIAGNOSIS — I2510 Atherosclerotic heart disease of native coronary artery without angina pectoris: Secondary | ICD-10-CM | POA: Insufficient documentation

## 2013-09-27 DIAGNOSIS — Z8249 Family history of ischemic heart disease and other diseases of the circulatory system: Secondary | ICD-10-CM | POA: Insufficient documentation

## 2013-09-27 DIAGNOSIS — J4489 Other specified chronic obstructive pulmonary disease: Secondary | ICD-10-CM | POA: Insufficient documentation

## 2013-09-27 DIAGNOSIS — Z87891 Personal history of nicotine dependence: Secondary | ICD-10-CM | POA: Insufficient documentation

## 2013-09-27 DIAGNOSIS — I739 Peripheral vascular disease, unspecified: Secondary | ICD-10-CM | POA: Insufficient documentation

## 2013-09-27 DIAGNOSIS — R42 Dizziness and giddiness: Secondary | ICD-10-CM | POA: Insufficient documentation

## 2013-09-27 DIAGNOSIS — R0609 Other forms of dyspnea: Secondary | ICD-10-CM | POA: Insufficient documentation

## 2013-09-27 DIAGNOSIS — R0989 Other specified symptoms and signs involving the circulatory and respiratory systems: Secondary | ICD-10-CM | POA: Insufficient documentation

## 2013-09-27 DIAGNOSIS — R5381 Other malaise: Secondary | ICD-10-CM | POA: Insufficient documentation

## 2013-09-27 DIAGNOSIS — R002 Palpitations: Secondary | ICD-10-CM | POA: Insufficient documentation

## 2013-09-27 MED ORDER — TECHNETIUM TC 99M SESTAMIBI GENERIC - CARDIOLITE
10.0000 | Freq: Once | INTRAVENOUS | Status: AC | PRN
  Administered 2013-09-27: 10 via INTRAVENOUS

## 2013-09-27 MED ORDER — TECHNETIUM TC 99M SESTAMIBI GENERIC - CARDIOLITE
30.0000 | Freq: Once | INTRAVENOUS | Status: AC | PRN
  Administered 2013-09-27: 30 via INTRAVENOUS

## 2013-09-27 MED ORDER — AMINOPHYLLINE 25 MG/ML IV SOLN
75.0000 mg | Freq: Once | INTRAVENOUS | Status: AC
  Administered 2013-09-27: 75 mg via INTRAVENOUS

## 2013-09-27 MED ORDER — REGADENOSON 0.4 MG/5ML IV SOLN
0.4000 mg | Freq: Once | INTRAVENOUS | Status: AC
  Administered 2013-09-27: 0.4 mg via INTRAVENOUS

## 2013-09-27 NOTE — Procedures (Addendum)
[facility] [address] [place] [address] [phone]  Cardiology Nuclear Med Study  Cheryle HorsfallJohnie E Walker is a 80 y.o. male     MRN : [id number]     DOB: [birth year]  Procedure Date: 09/27/2013  Nuclear Med Background Indication for Stress Test:  Surgical Clearance History:  COPD and CAD Cardiac Risk Factors: Carotid Disease, Family History - CAD, History of Smoking, Hypertension, Lipids and PVD  Symptoms:  Chest Pain, Dizziness, DOE, Fatigue, Light-Headedness, Near Syncope, Palpitations and weakness   Nuclear Pre-Procedure Caffeine/Decaff Intake:  7:00pm NPO After: 5:00am   IV Site: R Forearm  IV 0.9% NS with Angio Cath:  22g  Chest Size (in):  36"  IV Started by: Emmit PomfretAmanda Hicks, RN  Height: 5\' 7"  (1.702 m)  Cup Size: n/a  BMI:  Body mass index is 18.63 kg/(m^2). Weight:  119 lb (53.978 kg)   Tech Comments:  n/a    Nuclear Med Study 1 or 2 day study: 1 day  Stress Test Type:  Lexiscan  Order Authorizing Provider:  Nanetta BattyJonathan Ceclia Koker, MD   Resting Radionuclide: Technetium 3465m Sestamibi  Resting Radionuclide Dose: 10.0 mCi   Stress Radionuclide:  Technetium 6465m Sestamibi  Stress Radionuclide Dose: 30.5 mCi           Stress Protocol Rest HR: 65 Stress HR: 66  Rest BP: 175/80 Stress BP: 150/67  Exercise Time (min): n/a METS: n/a   Predicted Max HR: 136 bpm % Max HR: 61.76 bpm Rate Pressure Product: [id number]  Dose of Adenosine (mg):  n/a Dose of Lexiscan: 0.4 mg  Dose of Atropine (mg): n/a Dose of Dobutamine: n/a mcg/kg/min (at max HR)  Stress Test Technologist: Esperanza Sheetserry-Marie Martin, CCT Nuclear Technologist: Koren Shiverobin Moffitt, CNMT   Rest Procedure:  Myocardial perfusion imaging was performed at rest 45 minutes following the intravenous administration of Technetium 8965m Sestamibi. Stress Procedure:  The patient received IV Lexiscan 0.4 mg over 15-seconds.  Technetium 6765m Sestamibi injected at 30-seconds.  The patient  experienced marked SOB; 75 mg  IV Aminophylline was administered with resolution of symptoms.  There were no significant changes with Lexiscan.  Quantitative spect images were obtained after a 45 minute delay.  Transient Ischemic Dilatation (Normal <1.22):  1.01 Lung/Heart Ratio (Normal <0.45):  0.18 QGS EDV:  n/a ml QGS ESV:  n/a ml LV Ejection Fraction: Study not gated       Rest ECG: NSR-LVH  Stress ECG: No significant change from baseline ECG  QPS Raw Data Images:  Normal; no motion artifact; normal heart/lung ratio. Stress Images:  There is decreased uptake in the inferior wall. Rest Images:  There is decreased uptake in the inferior wall. Subtraction (SDS):  There is a fixed inferior defect that is most consistent with diaphragmatic attenuation.  Impression Exercise Capacity:  Lexiscan with no exercise. BP Response:  Normal blood pressure response. Clinical Symptoms:  No significant symptoms noted. ECG Impression:  Resting baseline STTWC Comparison with Prior Nuclear Study: No significant change from previous study  Overall Impression:  Low risk stress nuclear study Diaphragmatic attenuation vs non transmural inferior wall scar w/o ischemia.  LV Wall Motion:  This was a non gated study   Runell GessBERRY,Lasheika Ortloff J, MD  10/17/2013 5:55 PM

## 2013-09-27 NOTE — Telephone Encounter (Signed)
Pt need prior authorization for his Protonix,need this asap.. Please let pt know when you take care of this please.

## 2013-09-27 NOTE — Telephone Encounter (Signed)
Message forwarded to K. Vogel, RN.  

## 2013-09-28 NOTE — Telephone Encounter (Signed)
PA was approved!  Wife aware and form with approval faxed to the pharmacy.

## 2013-09-28 NOTE — Telephone Encounter (Signed)
Approved.  

## 2013-10-07 ENCOUNTER — Encounter: Payer: Self-pay | Admitting: Surgery

## 2013-10-10 ENCOUNTER — Ambulatory Visit (INDEPENDENT_AMBULATORY_CARE_PROVIDER_SITE_OTHER): Payer: Medicare Other | Admitting: Surgery

## 2013-10-10 ENCOUNTER — Encounter: Payer: Self-pay | Admitting: Surgery

## 2013-10-10 VITALS — BP 154/88 | HR 68 | Ht 67.0 in | Wt 117.5 lb

## 2013-10-10 DIAGNOSIS — I739 Peripheral vascular disease, unspecified: Secondary | ICD-10-CM

## 2013-10-10 NOTE — Progress Notes (Signed)
Patient name: Alec Walker MRN: [id number] DOB: September 27, 1926 Sex: male   Referred by: Dr. Allyson Sabal  Reason for referral:  Chief Complaint  Patient presents with  . New Evaluation    PVD    HISTORY OF PRESENT ILLNESS: Is is an 80 year old gentleman referred to me for evaluation of bilateral lower extremity claudication.  The patient has a history of right common iliac artery stenting many years ago.  He recently underwent angiography which revealed bilateral common femoral artery stenosis.  The patient reports significant claudication in bilateral calves.  This occurs when he walks to his mailbox and back.  The round trip is about 360 feet.  He has to stop to rest because of the discomfort.  This is significantly affects his lifestyle.  The patient has a history of coronary artery disease.  His most recent catheterization was many years ago which showed three-vessel disease approximately 40%.  He has known carotid artery stenosis.  He has 50-69% stenosis bilaterally.  He is asymptomatic.  The patient is medically managed for hypercholesterolemia with a statin.  He is on Plavix.  The patient is a former smoker for 60 years.  Past Medical History  Diagnosis Date  . COPD (chronic obstructive pulmonary disease)   . CAD (coronary artery disease)     a.  nonobstructive CAD by cath 1999. b. Myoview 04/2012: fixed inferior and inferoseptal bowel attenuation artifact, no reversible ischemia. EF 62%. Low risk scan.  Marland Kitchen HTN (hypertension)   . PUD (peptic ulcer disease)     a. s/p surgery 1976.  Marland Kitchen Syncope     2012  . Carotid artery disease 03/22/13    a. 50-69% BICA by duplex 03/2013, repeat needed 11/2013.  Marland Kitchen PAD (peripheral artery disease)     a. prior stenting of RCIA 1999. b. history of 90% vertebral artery narrowing, 40% subclavian artery narrowing. c. Diagnostic PV angio 08/2013 for progressive claudication - will ultimately need endarterectomy/patch angio on bilat CFA stenosis; will also  need atherectomy/PT/stenting to REIA.   . CKD (chronic kidney disease), stage III   . HLD (hyperlipidemia)   . Chronic respiratory failure     a. On home O2.  Marland Kitchen NSVT (nonsustained ventricular tachycardia)     a. Brief NSVT (6b) during 08/2013 adm.  . Stroke     Past Surgical History  Procedure Laterality Date  . Cataract extraction    . Gastrectomy    . Transurethral resection of prostate    . Coronary angioplasty with stent placement      stent in 2003    History   Social History  . Marital Status: Married    Spouse Name: N/A    Number of Children: 3  . Years of Education: N/A   Occupational History  . retired     Social History Main Topics  . Smoking status: Former Smoker -- 1.00 packs/day for 60 years    Types: Cigarettes    Quit date: 01/06/1997  . Smokeless tobacco: Never Used  . Alcohol Use: No     Comment: hx etoh abuse in past, daughter states quit over 20 years ago  . Drug Use: No  . Sexual Activity: No   Other Topics Concern  . Not on file   Social History Narrative  . No narrative on file    Family History  Problem Relation Age of Onset  . Hypertension    . Coronary artery disease    . Stroke Mother   .  Heart disease Mother   . Hypertension Mother   . Heart attack Mother   . Heart attack Brother   . Heart disease Brother   . Heart disease Father   . Hypertension Father   . Heart attack Father     Allergies as of 10/10/2013  . (No Known Allergies)    Current Outpatient Prescriptions on File Prior to Visit  Medication Sig Dispense Refill  . aspirin EC 81 MG tablet Take 81 mg by mouth at bedtime.      Marland Kitchen azithromycin (ZITHROMAX) 250 MG tablet       . budesonide-formoterol (SYMBICORT) 160-4.5 MCG/ACT inhaler Inhale 2 puffs into the lungs 2 (two) times daily as needed.       . Cholecalciferol (VITAMIN D) 2000 UNITS tablet Take 2,000 Units by mouth at bedtime.       . clopidogrel (PLAVIX) 75 MG tablet Take 75 mg by mouth daily.       .  ferrous sulfate 325 (65 FE) MG EC tablet Take 325 mg by mouth 3 (three) times daily with meals.      . metoprolol tartrate (LOPRESSOR) 25 MG tablet Take 12.5 mg by mouth 2 (two) times daily.       . mirtazapine (REMERON) 30 MG tablet Take 30 mg by mouth at bedtime.      Marland Kitchen omeprazole (PRILOSEC) 40 MG capsule       . OXYGEN-HELIUM IN Inhale into the lungs as needed. 2 liters      . pantoprazole (PROTONIX) 40 MG tablet Take 2 tablets (80 mg total) by mouth daily.  60 tablet  1  . pravastatin (PRAVACHOL) 40 MG tablet Take 40 mg by mouth at bedtime.       . predniSONE (DELTASONE) 20 MG tablet       . temazepam (RESTORIL) 30 MG capsule Take 30 mg by mouth at bedtime. For sleep       No current facility-administered medications on file prior to visit.     REVIEW OF SYSTEMS: Cardiovascular: Positive for chest pressure, shortness of breath when lying flat and with exertion.  Positive for pain in legs with walking and when lying flat. Pulmonary: Positive for productive cough . Neurologic: Positive for weakness in arms and legs.  Positive for dizziness. Hematologic: No bleeding problems or clotting disorders. Musculoskeletal: No joint pain or joint swelling. Gastrointestinal: No blood in stool or hematemesis Genitourinary: No dysuria or hematuria. Psychiatric:: No history of major depression. Integumentary: No rashes or ulcers. Constitutional: No fever or chills.  PHYSICAL EXAMINATION: General: The patient appears their stated age.  Vital signs are BP 154/88  Pulse 68  Ht 5\' 7"  (1.702 m)  Wt 117 lb 8 oz (53.298 kg)  BMI 18.40 kg/m2  SpO2 96% HEENT:  No gross abnormalities Pulmonary: Respirations are non-labored Abdomen: Soft and non-tender  Musculoskeletal: There are no major deformities.   Neurologic: No focal weakness or paresthesias are detected, Skin: There are no ulcer or rashes noted. Psychiatric: The patient has normal affect. Cardiovascular: There is a regular rate and rhythm  without significant murmur appreciated.  Palpable femoral pulses.  Pedal pulses are not palpable.  No carotid bruits.   \   Diagnostic Studies: I have reviewed his angiogram and outside Doppler studies which show common femoral disease bilaterally.    Assessment:  Bilateral claudication Plan: I discussed with the patient that this is an elective procedure.  He feels that his symptoms are significantly altering his quality of life and  wishes to proceed.  I was very specific in telling him which symptoms I would improve.  The patient gets cramping at night and in his hands, which I told them would not improve.  The only symptom I'm going to improve as is the calf pain he gets when walking to his mailbox.  The patient verbalized understanding of this and wants to proceed.  The operation has been scheduled for Friday, March 6.  I will do this in conjunction with Dr. Hart RochesterLawson so as to minimize the patient's time under general anesthesia.  He will stop his Plavix 5 days prior to the operation.  Again we will proceed with bilateral femoral endarterectomy and patch angioplasty.     Jorge NyV. Wells Brabham IV, M.D. [facility] [place] Office: [phone] Pager:  [phone]

## 2013-10-11 ENCOUNTER — Telehealth: Payer: Self-pay

## 2013-10-11 NOTE — Telephone Encounter (Signed)
Rec'd call from pt.  Stated he has changed his mind, and does not want to go through with surgery on 10/21/13.  Stated he just doesn't want to have the surgery.  Advised to call us when he is ready to reschedule.  Will make Dr. Myra GianottiBrabham aware of pt's decision to cancel the procedure.

## 2013-10-12 ENCOUNTER — Encounter (HOSPITAL_COMMUNITY): Payer: Medicare Other

## 2013-10-17 ENCOUNTER — Telehealth: Payer: Self-pay | Admitting: Cardiovascular Disease

## 2013-10-17 NOTE — Telephone Encounter (Signed)
Returned call and pt verified x 2.  Pt stated he didn't call.  Stated his daughter Tawny Hoppingheresia must have called.  Stated he was supposed to have surgery w/ Dr. Myra GianottiBrabham and decided not to, but talked to his daughter this morning and decided he wants to have it.  Call to daughter and she stated pt wanted her to call our office about getting the test from the 25th (Feb) rescheduled (carotid doppler). Stated pt wanted someone to call him and explain to him what that procedure involved.  Informed RN will do that and explained pt thought call was r/t him having surgery on his legs.  Daughter verbalized understanding and stated she has already called Dr. Estanislado SpireBrabham's office and it is being rescheduled.  Call to pt and informed he was scheduled for carotid doppler on 2.25.15 and it was cancelled.  Pt informed it is an ultrasound on his carotid arteries in his neck and it is not an invasive procedure.  Pt verbalized understanding and agreed to reschedule test.  [facility] to reschedule appt.

## 2013-10-17 NOTE — Telephone Encounter (Signed)
Is wanting to speak to Dr. Allyson SabalBerry or his nurse about the doppler in which he was schedule to have on 10/12/13.Marland Kitchen. Would like to have to have the procedure explained to him and what is exactly going to happen .Marland Kitchen. Please Call    Thanks

## 2013-10-17 NOTE — Addendum Note (Signed)
Encounter addended by: Runell GessJonathan J Daltyn Degroat, MD on: 10/17/2013  5:58 PM<BR>     Documentation filed: Clinical Notes

## 2013-10-18 ENCOUNTER — Encounter: Payer: Self-pay | Admitting: *Deleted

## 2013-10-19 ENCOUNTER — Encounter (HOSPITAL_COMMUNITY): Payer: Medicare Other

## 2013-10-28 ENCOUNTER — Encounter: Payer: Self-pay | Admitting: Surgery

## 2013-10-31 ENCOUNTER — Ambulatory Visit (INDEPENDENT_AMBULATORY_CARE_PROVIDER_SITE_OTHER): Payer: Medicare Other | Admitting: Surgery

## 2013-10-31 ENCOUNTER — Encounter: Payer: Self-pay | Admitting: Surgery

## 2013-10-31 VITALS — BP 196/78 | HR 56 | Ht 67.0 in | Wt 122.6 lb

## 2013-10-31 DIAGNOSIS — I739 Peripheral vascular disease, unspecified: Secondary | ICD-10-CM

## 2013-10-31 NOTE — Progress Notes (Signed)
Patient name: Alec Walker MRN: [id number] DOB: [birth year] Sex: male     Chief Complaint  Patient presents with  . Re-evaluation    discuss surgery again    HISTORY OF PRESENT ILLNESS: The patient is back today for further discussions of his claudication.  He was originally scheduled for bilateral femoral endarterectomy which he canceled.  I have spoken with his daughter on the telephone.  She states that he is severely limited by his walking.  He had some questions regarding the operation, and therefore I felt it was best if he came back for further discussion.  The patient has a history of right common iliac stenting many years ago.  Recent angiography revealed bilateral common femoral artery stenosis.  He states that he has significant calf claudication inside of 300 feet.  This significantly affects his lifestyle.  The patient has a history of coronary artery disease.  His most recent catheterization several years ago, showed three-vessel disease approximately 40%.  He has known carotid stenosis, and the 50-69% category.  He is asymptomatic.  He remains on a statin for hypercholesterolemia.  He is a former smoker for 60 years.  He is on Plavix.  Past Medical History  Diagnosis Date  . COPD (chronic obstructive pulmonary disease)   . CAD (coronary artery disease)     a.  nonobstructive CAD by cath 1999. b. Myoview 04/2012: fixed inferior and inferoseptal bowel attenuation artifact, no reversible ischemia. EF 62%. Low risk scan.  Marland Kitchen. HTN (hypertension)   . PUD (peptic ulcer disease)     a. s/p surgery 1976.  Marland Kitchen. Syncope     2012  . Carotid artery disease 03/22/13    a. 50-69% BICA by duplex 03/2013, repeat needed 11/2013.  Marland Kitchen. PAD (peripheral artery disease)     a. prior stenting of RCIA 1999. b. history of 90% vertebral artery narrowing, 40% subclavian artery narrowing. c. Diagnostic PV angio 08/2013 for progressive claudication - will ultimately need endarterectomy/patch angio on  bilat CFA stenosis; will also need atherectomy/PT/stenting to REIA.   . CKD (chronic kidney disease), stage III   . HLD (hyperlipidemia)   . Chronic respiratory failure     a. On home O2.  Marland Kitchen. NSVT (nonsustained ventricular tachycardia)     a. Brief NSVT (6b) during 08/2013 adm.  . Stroke     Past Surgical History  Procedure Laterality Date  . Cataract extraction    . Gastrectomy    . Transurethral resection of prostate    . Coronary angioplasty with stent placement      stent in 2003    History   Social History  . Marital Status: Married    Spouse Name: N/A    Number of Children: 3  . Years of Education: N/A   Occupational History  . retired     Social History Main Topics  . Smoking status: Former Smoker -- 1.00 packs/day for 60 years    Types: Cigarettes    Quit date: 01/06/1997  . Smokeless tobacco: Never Used  . Alcohol Use: No     Comment: hx etoh abuse in past, daughter states quit over 20 years ago  . Drug Use: No  . Sexual Activity: No   Other Topics Concern  . Not on file   Social History Narrative  . No narrative on file    Family History  Problem Relation Age of Onset  . Hypertension    . Coronary artery disease    .  Stroke Mother   . Heart disease Mother   . Hypertension Mother   . Heart attack Mother   . Heart attack Brother   . Heart disease Brother   . Heart disease Father   . Hypertension Father   . Heart attack Father     Allergies as of 10/31/2013  . (No Known Allergies)    Current Outpatient Prescriptions on File Prior to Visit  Medication Sig Dispense Refill  . aspirin EC 81 MG tablet Take 81 mg by mouth at bedtime.      Marland Kitchen azithromycin (ZITHROMAX) 250 MG tablet       . budesonide-formoterol (SYMBICORT) 160-4.5 MCG/ACT inhaler Inhale 2 puffs into the lungs 2 (two) times daily as needed.       . Cholecalciferol (VITAMIN D) 2000 UNITS tablet Take 2,000 Units by mouth at bedtime.       . clopidogrel (PLAVIX) 75 MG tablet Take 75 mg  by mouth daily.       . ferrous sulfate 325 (65 FE) MG EC tablet Take 325 mg by mouth 3 (three) times daily with meals.      . metoprolol tartrate (LOPRESSOR) 25 MG tablet Take 12.5 mg by mouth 2 (two) times daily.       . mirtazapine (REMERON) 30 MG tablet Take 30 mg by mouth at bedtime.      Marland Kitchen omeprazole (PRILOSEC) 40 MG capsule       . OXYGEN-HELIUM IN Inhale into the lungs as needed. 2 liters      . pantoprazole (PROTONIX) 40 MG tablet Take 2 tablets (80 mg total) by mouth daily.  60 tablet  1  . pravastatin (PRAVACHOL) 40 MG tablet Take 40 mg by mouth at bedtime.       . predniSONE (DELTASONE) 20 MG tablet       . temazepam (RESTORIL) 30 MG capsule Take 30 mg by mouth at bedtime. For sleep       No current facility-administered medications on file prior to visit.     REVIEW OF SYSTEMS: Please see previous office the.  No significant interval change.  PHYSICAL EXAMINATION:   Vital signs are BP 196/78  Pulse 56  Ht 5\' 7"  (1.702 m)  Wt 122 lb 9.6 oz (55.611 kg)  BMI 19.20 kg/m2  SpO2 96% General: The patient appears their stated age. HEENT:  No gross abnormalities Pulmonary:  Non labored breathing Abdomen: Soft and non-tender Musculoskeletal: There are no major deformities. Neurologic: No focal weakness or paresthesias are detected, Skin: There are no ulcer or rashes noted. Psychiatric: The patient has normal affect. Cardiovascular: There is a regular rate and rhythm without significant murmur appreciated.   Assessment: Bilateral claudication Plan: The patient will be scheduled for bilateral femoral endarterectomy on Friday, March 27.  He will stop his Plavix 5 days prior to the operation.  I had a rather extensive conversation regarding the benefits of operation.  We discussed how this would affect his current symptoms.  He strongly desires to have this addressed as his calf cramping is severely restricting his lifestyle.  Jorge Ny, M.D. [facility] [place] Office: [phone] Pager:  [phone]

## 2013-11-01 ENCOUNTER — Other Ambulatory Visit: Payer: Self-pay | Admitting: *Deleted

## 2013-11-01 ENCOUNTER — Telehealth: Payer: Self-pay | Admitting: Cardiovascular Disease

## 2013-11-01 NOTE — Telephone Encounter (Signed)
Returned call.  Alec Walker stated Dr. Estanislado SpireBrabham's office called her w/ the information she needed.  Stated she was going to ask who the surgeon was, but they told her everything she needed to know.  No further action taken.

## 2013-11-01 NOTE — Telephone Encounter (Signed)
Please call,she needs the name of the vascular surgeon he was referred out to.

## 2013-11-01 NOTE — Telephone Encounter (Signed)
Message forwarded to Wanda, CMA.  

## 2013-11-04 ENCOUNTER — Encounter (HOSPITAL_COMMUNITY): Payer: Self-pay | Admitting: Pharmacy Technician

## 2013-11-05 NOTE — Pre-Procedure Instructions (Signed)
[place] - Preparing for Surgery  Before surgery, you can play an important role.  Because skin is not sterile, your skin needs to be as free of germs as possible.  You can reduce the number of germs on you skin by washing with CHG (chlorahexidine gluconate) soap before surgery.  CHG is an antiseptic cleaner which kills germs and bonds with the skin to continue killing germs even after washing.  Please DO NOT use if you have an allergy to CHG or antibacterial soaps.  If your skin becomes reddened/irritated stop using the CHG and inform your nurse when you arrive at Short Stay.  Do not shave (including legs and underarms) for at least 48 hours prior to the first CHG shower.  You may shave your face.  Please follow these instructions carefully:   1.  Shower with CHG Soap the night before surgery and the morning of Surgery.  2.  If you choose to wash your hair, wash your hair first as usual with your normal shampoo.  3.  After you shampoo, rinse your hair and body thoroughly to remove the shampoo.  4.  Use CHG as you would any other liquid soap.  You can apply CHG directly to the skin and wash gently with scrungie or a clean washcloth.  5.  Apply the CHG Soap to your body ONLY FROM THE NECK DOWN.  Do not use on open wounds or open sores.  Avoid contact with your eyes, ears, mouth and genitals (private parts).  Wash genitals (private parts) with your normal soap.  6.  Wash thoroughly, paying special attention to the area where your surgery will be performed.  7.  Thoroughly rinse your body with warm water from the neck down.  8.  DO NOT shower/wash with your normal soap after using and rinsing off the CHG Soap.  9.  Pat yourself dry with a clean towel.            10.  Wear clean pajamas.            11.  Place clean sheets on your bed the night of your first shower and do not sleep with pets.  Day of Surgery  Do not apply any lotions the morning of surgery.  Please wear clean clothes to the  hospital/surgery center.   

## 2013-11-05 NOTE — Pre-Procedure Instructions (Signed)
Alec PitcherJohnie E Walker  11/05/2013   Your procedure is scheduled on:  March 27  [facility] En[facility] "A" [address] at [place]08:30 AM.  Call this number if you have problems the morning of surgery: [phone]   Remember:   Do not eat food or drink liquids after midnight.   Take these medicines the morning of surgery with A SIP OF WATER: Azithromycin, Symbicort, Metoprolol, Omeprazole, Protonix, Prednisone   STOP Plavix, Vitamin D, today   STOP/ Do not take Aleve, Naproxen, Advil, Ibuprofen, Vitamin, Herbs, or Supplements starting today   Do not wear jewelry, make-up or nail polish.  Do not wear lotions, powders, or perfumes. You may not wear deodorant.  Do not shave 48 hours prior to surgery. Men may shave face and neck.  Do not bring valuables to the hospital.  [facility] is not responsible for any belongings or valuables.               Contacts, dentures or bridgework may not be worn into surgery.  Leave suitcase in the car. After surgery it may be brought to your room.  For patients admitted to the hospital, discharge time is determined by your treatment team.               Special Instructions: See [facility] Preparing For Surgery   Please read over the following fact sheets that you were given: Pain Booklet, Coughing and Deep Breathing, Blood Transfusion Information and Surgical Site Infection Prevention

## 2013-11-07 ENCOUNTER — Encounter (HOSPITAL_COMMUNITY): Payer: Self-pay

## 2013-11-07 ENCOUNTER — Ambulatory Visit (HOSPITAL_COMMUNITY)
Admission: RE | Admit: 2013-11-07 | Discharge: 2013-11-07 | Disposition: A | Discharge status: Home / Self Care | Payer: Medicare Other | Source: Ambulatory Visit | Attending: Surgery | Admitting: Surgery

## 2013-11-07 ENCOUNTER — Encounter (HOSPITAL_COMMUNITY)
Admission: RE | Admit: 2013-11-07 | Discharge: 2013-11-07 | Disposition: A | Discharge status: Home / Self Care | Payer: Medicare Other | Source: Ambulatory Visit | Attending: Surgery | Admitting: Surgery

## 2013-11-07 DIAGNOSIS — Z01812 Encounter for preprocedural laboratory examination: Secondary | ICD-10-CM | POA: Insufficient documentation

## 2013-11-07 HISTORY — DX: Shortness of breath: R06.02

## 2013-11-07 LAB — CBC
HEMATOCRIT: 39.9 % (ref 39.0–52.0)
HEMOGLOBIN: 13.1 g/dL (ref 13.0–17.0)
MCH: 32.8 pg (ref 26.0–34.0)
MCHC: 32.8 g/dL (ref 30.0–36.0)
MCV: 99.8 fL (ref 78.0–100.0)
Platelets: 211 10*3/uL (ref 150–400)
RBC: 4 MIL/uL — ABNORMAL LOW (ref 4.22–5.81)
RDW: 16.5 % — ABNORMAL HIGH (ref 11.5–15.5)
WBC: 7.8 10*3/uL (ref 4.0–10.5)

## 2013-11-07 LAB — COMPREHENSIVE METABOLIC PANEL
ALBUMIN: 3.4 g/dL — AB (ref 3.5–5.2)
ALK PHOS: 56 U/L (ref 39–117)
ALT: 23 U/L (ref 0–53)
AST: 32 U/L (ref 0–37)
BUN: 32 mg/dL — ABNORMAL HIGH (ref 6–23)
CHLORIDE: 107 meq/L (ref 96–112)
CO2: 23 mEq/L (ref 19–32)
Calcium: 9.6 mg/dL (ref 8.4–10.5)
Creatinine, Ser: 1.43 mg/dL — ABNORMAL HIGH (ref 0.50–1.35)
GFR calc non Af Amer: 43 mL/min — ABNORMAL LOW (ref >90)
GFR, EST AFRICAN AMERICAN: 50 mL/min — AB (ref >90)
GLUCOSE: 95 mg/dL (ref 70–99)
POTASSIUM: 4.5 meq/L (ref 3.7–5.3)
Sodium: 142 mEq/L (ref 137–147)
Total Protein: 6.6 g/dL (ref 6.0–8.3)

## 2013-11-07 LAB — URINALYSIS, ROUTINE W REFLEX MICROSCOPIC
Bilirubin Urine: NEGATIVE
Glucose, UA: NEGATIVE mg/dL
Hgb urine dipstick: NEGATIVE
Ketones, ur: NEGATIVE mg/dL
Leukocytes, UA: NEGATIVE
NITRITE: NEGATIVE
PH: 5 (ref 5.0–8.0)
Protein, ur: NEGATIVE mg/dL
SPECIFIC GRAVITY, URINE: 1.019 (ref 1.005–1.030)
Urobilinogen, UA: 0.2 mg/dL (ref 0.0–1.0)

## 2013-11-07 LAB — SURGICAL PCR SCREEN
MRSA, PCR: NEGATIVE
STAPHYLOCOCCUS AUREUS: NEGATIVE

## 2013-11-07 LAB — PROTIME-INR
INR: 0.96 (ref 0.00–1.49)
PROTHROMBIN TIME: 12.6 s (ref 11.6–15.2)

## 2013-11-07 LAB — TYPE AND SCREEN
ABO/RH(D): O POS
Antibody Screen: NEGATIVE

## 2013-11-07 LAB — ABO/RH: ABO/RH(D): O POS

## 2013-11-07 LAB — APTT: APTT: 30 s (ref 24–37)

## 2013-11-07 MED ORDER — CHLORHEXIDINE GLUCONATE 4 % EX LIQD: 60.0000 mL | Freq: Once | CUTANEOUS | Status: DC

## 2013-11-09 ENCOUNTER — Encounter (HOSPITAL_COMMUNITY): Payer: Self-pay

## 2013-11-09 NOTE — Progress Notes (Signed)
Anesthesia chart review: Patient is an 80 year old male scheduled for bilateral femoral artery endarterectomies on 11/11/2013 by Dr. Myra GianottiBrabham. History includes PAD with remote history of right CIA stent, CVA, 50-69% bilateral carotid artery stenosis '14, former smoker, COPD, nighttime home O2 at 2 L per nasal cannula (PRN during the day), CKD stage III, non-obstructive CAD with non-ischemic stress test 09/2013, peptic ulcer disease, hyperlipidemia, syncope in 2012, TURP, brief NSVT 08/2013.  Dr. Myra GianottiBrabham instructed him to hold Plavix for 5 days preoperatively. Cardiologist is Dr. Nanetta BattyJonathan Berry who initially referred patient to Dr. Myra GianottiBrabham. PCP is Dr. Antony HasteMichael Badger.  Nuclear stress test on 09/27/13 showed: Overall Impression: Low risk stress nuclear study Diaphragmatic attenuation vs non transmural inferior wall scar w/o ischemia. LV Wall Motion: This was a non gated study.  Echo on 07/11/13 showed: - Left ventricle: The cavity size was normal. There was mild focal basal hypertrophy of the septum. Systolic function was normal. The estimated ejection fraction was in the range of 55% to 65%. Wall motion was normal; there were no regional wall motion abnormalities. Doppler parameters are consistent with abnormal left ventricular relaxation (grade 1 diastolic dysfunction). Doppler parameters are consistent with elevated mean left atrial filling pressure. - Aortic valve: Mildly calcified annulus. Trileaflet; normal thickness, mildly calcified leaflets. - Mitral valve: Mild, late systolicprolapse, involving the posterior leaflet. Mild regurgitation directed centrally and toward the free wall. Impressions: Frequent ectopy was noted on this study, making this study technically difficult for the assessment of cardiac function.  By notes, he has "mild coronary artery disease with 40% LAD stenosis, 40% diagonal stenosis and 40% RCA stenosis by cardiac catheter[facility]."  I  spoke with him briefly at PAT and he denied chest pain or new respiratory symptoms.  He feels at his baseline.  Carotid duplex on 03/22/13 showed 50-69% bilateral ICA stenosis.  CXR on 11/07/13 showed: FINDINGS: Mediastinum and hilar structures are normal. Lungs are clear. Previously identified infiltrate in the right perihilar region has cleared . Heart size normal. Surgical clips noted at the gastroesophageal junction. Changes of COPD noted. IMPRESSION: Interim clearing of right perihilar infiltrate since 08/20/13.  Preoperative labs noted. Dr. Myra GianottiBrabham can follow renal function post-operatively.  Patient with recent cardiology evaluation and testing.  CXR is improved. He denied any new cardiopulmonary symptoms. If no acute changes then I would anticipate that he could proceed as planned.   Velna Ochsllison Zelenak, PA-C [facility]/Anesthesiology Phone [phone] 11/09/2013 10:49 AM

## 2013-11-10 MED ORDER — SODIUM CHLORIDE 0.9 % IV SOLN
INTRAVENOUS | Status: DC
  Administered 2013-11-11: 12:00:00 via INTRAVENOUS

## 2013-11-10 MED ORDER — DEXTROSE 5 % IV SOLN
1.5000 g | INTRAVENOUS | Status: AC
  Administered 2013-11-11: 1.5 g via INTRAVENOUS
  Filled 2013-11-10: qty 1.5

## 2013-11-10 NOTE — Progress Notes (Signed)
Spoke with Mrs. Nichter and instructed her of patient needing to arrive at 730 am.(surgery 1141am).

## 2013-11-11 ENCOUNTER — Inpatient Hospital Stay (HOSPITAL_COMMUNITY): Payer: Medicare Other | Admitting: Certified Registered Nurse Anesthetist

## 2013-11-11 ENCOUNTER — Encounter (HOSPITAL_COMMUNITY): Payer: Self-pay | Admitting: *Deleted

## 2013-11-11 ENCOUNTER — Inpatient Hospital Stay (HOSPITAL_COMMUNITY)
Admission: RE | Admit: 2013-11-11 | Discharge: 2013-11-14 | DRG: 254 | Disposition: A | Discharge status: Home / Self Care | Payer: Medicare Other | Source: Ambulatory Visit | Attending: Surgery | Admitting: Surgery

## 2013-11-11 ENCOUNTER — Encounter (HOSPITAL_COMMUNITY): Payer: Medicare Other | Admitting: Vascular Surgery

## 2013-11-11 ENCOUNTER — Encounter (HOSPITAL_COMMUNITY)
Admission: RE | Disposition: A | Discharge status: Home / Self Care | Payer: Self-pay | Source: Ambulatory Visit | Attending: Surgery

## 2013-11-11 DIAGNOSIS — Z8673 Personal history of transient ischemic attack (TIA), and cerebral infarction without residual deficits: Secondary | ICD-10-CM

## 2013-11-11 DIAGNOSIS — Z9861 Coronary angioplasty status: Secondary | ICD-10-CM

## 2013-11-11 DIAGNOSIS — R339 Retention of urine, unspecified: Secondary | ICD-10-CM | POA: Diagnosis present

## 2013-11-11 DIAGNOSIS — Z87891 Personal history of nicotine dependence: Secondary | ICD-10-CM

## 2013-11-11 DIAGNOSIS — I739 Peripheral vascular disease, unspecified: Secondary | ICD-10-CM | POA: Diagnosis present

## 2013-11-11 DIAGNOSIS — I2510 Atherosclerotic heart disease of native coronary artery without angina pectoris: Secondary | ICD-10-CM | POA: Diagnosis present

## 2013-11-11 DIAGNOSIS — I6529 Occlusion and stenosis of unspecified carotid artery: Secondary | ICD-10-CM | POA: Diagnosis present

## 2013-11-11 DIAGNOSIS — J4489 Other specified chronic obstructive pulmonary disease: Secondary | ICD-10-CM | POA: Diagnosis present

## 2013-11-11 DIAGNOSIS — I129 Hypertensive chronic kidney disease with stage 1 through stage 4 chronic kidney disease, or unspecified chronic kidney disease: Secondary | ICD-10-CM | POA: Diagnosis present

## 2013-11-11 DIAGNOSIS — Z79899 Other long term (current) drug therapy: Secondary | ICD-10-CM

## 2013-11-11 DIAGNOSIS — I70219 Atherosclerosis of native arteries of extremities with intermittent claudication, unspecified extremity: Secondary | ICD-10-CM

## 2013-11-11 DIAGNOSIS — J449 Chronic obstructive pulmonary disease, unspecified: Secondary | ICD-10-CM | POA: Diagnosis present

## 2013-11-11 DIAGNOSIS — Z9981 Dependence on supplemental oxygen: Secondary | ICD-10-CM

## 2013-11-11 DIAGNOSIS — I10 Essential (primary) hypertension: Secondary | ICD-10-CM

## 2013-11-11 DIAGNOSIS — N183 Chronic kidney disease, stage 3 unspecified: Secondary | ICD-10-CM | POA: Diagnosis present

## 2013-11-11 DIAGNOSIS — E785 Hyperlipidemia, unspecified: Secondary | ICD-10-CM

## 2013-11-11 DIAGNOSIS — Z7982 Long term (current) use of aspirin: Secondary | ICD-10-CM

## 2013-11-11 DIAGNOSIS — Z9849 Cataract extraction status, unspecified eye: Secondary | ICD-10-CM

## 2013-11-11 HISTORY — PX: ENDARTERECTOMY FEMORAL: SHX5804

## 2013-11-11 LAB — CBC
HCT: 35.5 % — ABNORMAL LOW (ref 39.0–52.0)
Hemoglobin: 11.6 g/dL — ABNORMAL LOW (ref 13.0–17.0)
MCH: 32.5 pg (ref 26.0–34.0)
MCHC: 32.7 g/dL (ref 30.0–36.0)
MCV: 99.4 fL (ref 78.0–100.0)
PLATELETS: 132 10*3/uL — AB (ref 150–400)
RBC: 3.57 MIL/uL — AB (ref 4.22–5.81)
RDW: 16.2 % — AB (ref 11.5–15.5)
WBC: 7.2 10*3/uL (ref 4.0–10.5)

## 2013-11-11 LAB — CREATININE, SERUM
CREATININE: 1.27 mg/dL (ref 0.50–1.35)
GFR calc Af Amer: 58 mL/min — ABNORMAL LOW (ref >90)
GFR calc non Af Amer: 50 mL/min — ABNORMAL LOW (ref >90)

## 2013-11-11 SURGERY — ENDARTERECTOMY, FEMORAL
Anesthesia: General | Site: Groin | Laterality: Bilateral

## 2013-11-11 MED ORDER — BUDESONIDE-FORMOTEROL FUMARATE 160-4.5 MCG/ACT IN AERO
2.0000 | INHALATION_SPRAY | Freq: Two times a day (BID) | RESPIRATORY_TRACT | Status: DC
  Administered 2013-11-12 – 2013-11-14 (×4): 2 via RESPIRATORY_TRACT
  Filled 2013-11-11: qty 6

## 2013-11-11 MED ORDER — METOPROLOL TARTRATE 25 MG PO TABS
25.0000 mg | ORAL_TABLET | Freq: Once | ORAL | Status: DC
  Filled 2013-11-11: qty 1

## 2013-11-11 MED ORDER — MIDAZOLAM HCL 2 MG/2ML IJ SOLN
INTRAMUSCULAR | Status: AC
  Filled 2013-11-11: qty 2

## 2013-11-11 MED ORDER — ENOXAPARIN SODIUM 40 MG/0.4ML ~~LOC~~ SOLN
40.0000 mg | SUBCUTANEOUS | Status: DC
  Administered 2013-11-12 – 2013-11-14 (×3): 40 mg via SUBCUTANEOUS
  Filled 2013-11-11 (×4): qty 0.4

## 2013-11-11 MED ORDER — SODIUM CHLORIDE 0.9 % IV SOLN
INTRAVENOUS | Status: DC
  Administered 2013-11-11: 20 mL/h via INTRAVENOUS

## 2013-11-11 MED ORDER — GLYCOPYRROLATE 0.2 MG/ML IJ SOLN
INTRAMUSCULAR | Status: DC | PRN
  Administered 2013-11-11: 0.6 mg via INTRAVENOUS

## 2013-11-11 MED ORDER — FENTANYL CITRATE 0.05 MG/ML IJ SOLN
INTRAMUSCULAR | Status: AC
  Filled 2013-11-11: qty 5

## 2013-11-11 MED ORDER — PROPOFOL 10 MG/ML IV BOLUS
INTRAVENOUS | Status: AC
  Filled 2013-11-11: qty 20

## 2013-11-11 MED ORDER — SIMVASTATIN 20 MG PO TABS
20.0000 mg | ORAL_TABLET | Freq: Every day | ORAL | Status: DC
  Administered 2013-11-12 – 2013-11-13 (×2): 20 mg via ORAL
  Filled 2013-11-11 (×3): qty 1

## 2013-11-11 MED ORDER — ALUM & MAG HYDROXIDE-SIMETH 200-200-20 MG/5ML PO SUSP: 15.0000 mL | ORAL | Status: DC | PRN

## 2013-11-11 MED ORDER — LIDOCAINE HCL (CARDIAC) 20 MG/ML IV SOLN
INTRAVENOUS | Status: DC | PRN
  Administered 2013-11-11: 50 mg via INTRAVENOUS

## 2013-11-11 MED ORDER — TEMAZEPAM 15 MG PO CAPS
30.0000 mg | ORAL_CAPSULE | Freq: Every evening | ORAL | Status: DC | PRN
  Administered 2013-11-11 – 2013-11-13 (×3): 30 mg via ORAL
  Filled 2013-11-11 (×3): qty 2

## 2013-11-11 MED ORDER — PNEUMOCOCCAL VAC POLYVALENT 25 MCG/0.5ML IJ INJ
0.5000 mL | INJECTION | INTRAMUSCULAR | Status: DC
  Filled 2013-11-11: qty 0.5

## 2013-11-11 MED ORDER — METOPROLOL TARTRATE 25 MG PO TABS
25.0000 mg | ORAL_TABLET | Freq: Two times a day (BID) | ORAL | Status: DC
  Administered 2013-11-12 – 2013-11-14 (×5): 25 mg via ORAL
  Filled 2013-11-11 (×7): qty 1

## 2013-11-11 MED ORDER — HYDRALAZINE HCL 20 MG/ML IJ SOLN: 10.0000 mg | INTRAMUSCULAR | Status: DC | PRN

## 2013-11-11 MED ORDER — ROCURONIUM BROMIDE 100 MG/10ML IV SOLN
INTRAVENOUS | Status: DC | PRN
  Administered 2013-11-11: 50 mg via INTRAVENOUS

## 2013-11-11 MED ORDER — HEPARIN SODIUM (PORCINE) 1000 UNIT/ML IJ SOLN
INTRAMUSCULAR | Status: DC | PRN
  Administered 2013-11-11: 6000 [IU] via INTRAVENOUS

## 2013-11-11 MED ORDER — DOPAMINE-DEXTROSE 3.2-5 MG/ML-% IV SOLN: 3.0000 ug/kg/min | INTRAVENOUS | Status: DC

## 2013-11-11 MED ORDER — CLOPIDOGREL BISULFATE 75 MG PO TABS
75.0000 mg | ORAL_TABLET | Freq: Every day | ORAL | Status: DC
  Administered 2013-11-12 – 2013-11-14 (×3): 75 mg via ORAL
  Filled 2013-11-11 (×4): qty 1

## 2013-11-11 MED ORDER — PROTAMINE SULFATE 10 MG/ML IV SOLN
INTRAVENOUS | Status: DC | PRN
  Administered 2013-11-11 (×3): 10 mg via INTRAVENOUS
  Administered 2013-11-11: 20 mg via INTRAVENOUS

## 2013-11-11 MED ORDER — 0.9 % SODIUM CHLORIDE (POUR BTL) OPTIME
TOPICAL | Status: DC | PRN
  Administered 2013-11-11: 2000 mL

## 2013-11-11 MED ORDER — BISACODYL 10 MG RE SUPP: 10.0000 mg | Freq: Every day | RECTAL | Status: DC | PRN

## 2013-11-11 MED ORDER — OXYCODONE HCL 5 MG/5ML PO SOLN: 5.0000 mg | Freq: Once | ORAL | Status: DC | PRN

## 2013-11-11 MED ORDER — AZITHROMYCIN 250 MG PO TABS
250.0000 mg | ORAL_TABLET | Freq: Every day | ORAL | Status: DC
  Administered 2013-11-11 – 2013-11-14 (×4): 250 mg via ORAL
  Filled 2013-11-11 (×4): qty 1

## 2013-11-11 MED ORDER — PHENOL 1.4 % MT LIQD: 1.0000 | OROMUCOSAL | Status: DC | PRN

## 2013-11-11 MED ORDER — HEMOSTATIC AGENTS (NO CHARGE) OPTIME
TOPICAL | Status: DC | PRN
  Administered 2013-11-11: 1 via TOPICAL

## 2013-11-11 MED ORDER — OXYCODONE-ACETAMINOPHEN 5-325 MG PO TABS
1.0000 | ORAL_TABLET | ORAL | Status: DC | PRN
  Administered 2013-11-12: 1 via ORAL
  Filled 2013-11-11: qty 1

## 2013-11-11 MED ORDER — PANTOPRAZOLE SODIUM 40 MG PO TBEC: 80.0000 mg | DELAYED_RELEASE_TABLET | Freq: Every day | ORAL | Status: DC

## 2013-11-11 MED ORDER — DEXTROSE 5 % IV SOLN
1.5000 g | INTRAVENOUS | Status: AC
  Administered 2013-11-12 – 2013-11-13 (×2): 1.5 g via INTRAVENOUS
  Filled 2013-11-11 (×2): qty 1.5

## 2013-11-11 MED ORDER — ONDANSETRON HCL 4 MG/2ML IJ SOLN: 4.0000 mg | Freq: Four times a day (QID) | INTRAMUSCULAR | Status: DC | PRN

## 2013-11-11 MED ORDER — ONDANSETRON HCL 4 MG/2ML IJ SOLN
4.0000 mg | Freq: Four times a day (QID) | INTRAMUSCULAR | Status: DC | PRN
  Administered 2013-11-13: 4 mg via INTRAVENOUS
  Filled 2013-11-11: qty 2

## 2013-11-11 MED ORDER — DEXTROSE-NACL 5-0.45 % IV SOLN
INTRAVENOUS | Status: DC
  Administered 2013-11-11: 20:00:00 via INTRAVENOUS

## 2013-11-11 MED ORDER — SODIUM CHLORIDE 0.9 % IV SOLN: 500.0000 mL | Freq: Once | INTRAVENOUS | Status: AC | PRN

## 2013-11-11 MED ORDER — MIRTAZAPINE 30 MG PO TABS
30.0000 mg | ORAL_TABLET | Freq: Every day | ORAL | Status: DC
  Administered 2013-11-11 – 2013-11-13 (×3): 30 mg via ORAL
  Filled 2013-11-11 (×4): qty 1

## 2013-11-11 MED ORDER — MORPHINE SULFATE 2 MG/ML IJ SOLN: 2.0000 mg | INTRAMUSCULAR | Status: DC | PRN

## 2013-11-11 MED ORDER — SENNOSIDES-DOCUSATE SODIUM 8.6-50 MG PO TABS
1.0000 | ORAL_TABLET | Freq: Every evening | ORAL | Status: DC | PRN
  Filled 2013-11-11: qty 1

## 2013-11-11 MED ORDER — LACTATED RINGERS IV SOLN
INTRAVENOUS | Status: DC | PRN
  Administered 2013-11-11 (×2): via INTRAVENOUS

## 2013-11-11 MED ORDER — VITAMIN D 1000 UNITS PO TABS
2000.0000 [IU] | ORAL_TABLET | Freq: Every day | ORAL | Status: DC
  Administered 2013-11-12 – 2013-11-13 (×2): 2000 [IU] via ORAL
  Filled 2013-11-11 (×4): qty 2

## 2013-11-11 MED ORDER — PREDNISONE 20 MG PO TABS
20.0000 mg | ORAL_TABLET | Freq: Every day | ORAL | Status: DC
  Administered 2013-11-12 – 2013-11-14 (×3): 20 mg via ORAL
  Filled 2013-11-11 (×4): qty 1

## 2013-11-11 MED ORDER — POTASSIUM CHLORIDE CRYS ER 20 MEQ PO TBCR: 20.0000 meq | EXTENDED_RELEASE_TABLET | Freq: Every day | ORAL | Status: DC | PRN

## 2013-11-11 MED ORDER — THROMBIN 20000 UNITS EX SOLR
CUTANEOUS | Status: AC
  Filled 2013-11-11: qty 20000

## 2013-11-11 MED ORDER — DOCUSATE SODIUM 100 MG PO CAPS
100.0000 mg | ORAL_CAPSULE | Freq: Every day | ORAL | Status: DC
  Administered 2013-11-12 – 2013-11-14 (×3): 100 mg via ORAL
  Filled 2013-11-11 (×3): qty 1

## 2013-11-11 MED ORDER — MAGNESIUM SULFATE 40 MG/ML IJ SOLN
2.0000 g | Freq: Every day | INTRAMUSCULAR | Status: DC | PRN
  Filled 2013-11-11: qty 50

## 2013-11-11 MED ORDER — METOPROLOL TARTRATE 1 MG/ML IV SOLN: 2.0000 mg | INTRAVENOUS | Status: DC | PRN

## 2013-11-11 MED ORDER — PHENYLEPHRINE HCL 10 MG/ML IJ SOLN
10.0000 mg | INTRAVENOUS | Status: DC | PRN
  Administered 2013-11-11: 15 ug/min via INTRAVENOUS

## 2013-11-11 MED ORDER — PROPOFOL 10 MG/ML IV BOLUS
INTRAVENOUS | Status: DC | PRN
  Administered 2013-11-11: 120 mg via INTRAVENOUS

## 2013-11-11 MED ORDER — NEOSTIGMINE METHYLSULFATE 1 MG/ML IJ SOLN
INTRAMUSCULAR | Status: DC | PRN
  Administered 2013-11-11: 4 mg via INTRAVENOUS

## 2013-11-11 MED ORDER — SODIUM CHLORIDE 0.9 % IR SOLN
Status: DC | PRN
  Administered 2013-11-11: 12:00:00

## 2013-11-11 MED ORDER — EPHEDRINE SULFATE 50 MG/ML IJ SOLN
INTRAMUSCULAR | Status: DC | PRN
  Administered 2013-11-11 (×2): 10 mg via INTRAVENOUS

## 2013-11-11 MED ORDER — ACETAMINOPHEN 325 MG PO TABS: 325.0000 mg | ORAL_TABLET | ORAL | Status: DC | PRN

## 2013-11-11 MED ORDER — PANTOPRAZOLE SODIUM 40 MG PO TBEC
80.0000 mg | DELAYED_RELEASE_TABLET | Freq: Every day | ORAL | Status: DC
  Administered 2013-11-11 – 2013-11-14 (×4): 80 mg via ORAL
  Filled 2013-11-11 (×4): qty 2

## 2013-11-11 MED ORDER — ALBUMIN HUMAN 5 % IV SOLN
INTRAVENOUS | Status: DC | PRN
  Administered 2013-11-11: 13:00:00 via INTRAVENOUS

## 2013-11-11 MED ORDER — GUAIFENESIN-DM 100-10 MG/5ML PO SYRP: 15.0000 mL | ORAL_SOLUTION | ORAL | Status: DC | PRN

## 2013-11-11 MED ORDER — ACETAMINOPHEN 650 MG RE SUPP: 325.0000 mg | RECTAL | Status: DC | PRN

## 2013-11-11 MED ORDER — FERROUS SULFATE 325 (65 FE) MG PO TABS
325.0000 mg | ORAL_TABLET | Freq: Three times a day (TID) | ORAL | Status: DC
  Administered 2013-11-12 – 2013-11-13 (×4): 325 mg via ORAL
  Filled 2013-11-11 (×10): qty 1

## 2013-11-11 MED ORDER — FENTANYL CITRATE 0.05 MG/ML IJ SOLN
INTRAMUSCULAR | Status: DC | PRN
  Administered 2013-11-11 (×2): 100 ug via INTRAVENOUS
  Administered 2013-11-11: 50 ug via INTRAVENOUS

## 2013-11-11 MED ORDER — LABETALOL HCL 5 MG/ML IV SOLN
10.0000 mg | INTRAVENOUS | Status: DC | PRN
  Filled 2013-11-11: qty 4

## 2013-11-11 MED ORDER — ASPIRIN EC 81 MG PO TBEC
81.0000 mg | DELAYED_RELEASE_TABLET | Freq: Every day | ORAL | Status: DC
  Administered 2013-11-11 – 2013-11-13 (×3): 81 mg via ORAL
  Filled 2013-11-11 (×4): qty 1

## 2013-11-11 MED ORDER — FENTANYL CITRATE 0.05 MG/ML IJ SOLN: 25.0000 ug | INTRAMUSCULAR | Status: DC | PRN

## 2013-11-11 MED ORDER — METOPROLOL TARTRATE 12.5 MG HALF TABLET
ORAL_TABLET | ORAL | Status: AC
  Administered 2013-11-11: 08:00:00
  Filled 2013-11-11: qty 2

## 2013-11-11 MED ORDER — OXYCODONE HCL 5 MG PO TABS: 5.0000 mg | ORAL_TABLET | Freq: Once | ORAL | Status: DC | PRN

## 2013-11-11 SURGICAL SUPPLY — 59 items
ADH SKN CLS APL DERMABOND .7 (GAUZE/BANDAGES/DRESSINGS) ×2
BANDAGE ELASTIC 4 VELCRO ST LF (GAUZE/BANDAGES/DRESSINGS) IMPLANT
BLADE SURG ROTATE 9660 (MISCELLANEOUS) ×3 IMPLANT
CANISTER SUCTION 2500CC (MISCELLANEOUS) ×3 IMPLANT
CLIP TI MEDIUM 24 (CLIP) ×3 IMPLANT
CLIP TI WIDE RED SMALL 24 (CLIP) ×3 IMPLANT
COVER SURGICAL LIGHT HANDLE (MISCELLANEOUS) ×6 IMPLANT
DERMABOND ADVANCED (GAUZE/BANDAGES/DRESSINGS) ×4
DERMABOND ADVANCED .7 DNX12 (GAUZE/BANDAGES/DRESSINGS) ×2 IMPLANT
DRAIN CHANNEL 15F RND FF W/TCR (WOUND CARE) IMPLANT
DRAPE WARM FLUID 44X44 (DRAPE) ×3 IMPLANT
DRAPE X-RAY CASS 24X20 (DRAPES) IMPLANT
DRSG COVADERM 4X10 (GAUZE/BANDAGES/DRESSINGS) IMPLANT
DRSG COVADERM 4X8 (GAUZE/BANDAGES/DRESSINGS) IMPLANT
ELECT CAUTERY BLADE 6.4 (BLADE) ×9 IMPLANT
ELECT REM PT RETURN 9FT ADLT (ELECTROSURGICAL) ×6
ELECTRODE REM PT RTRN 9FT ADLT (ELECTROSURGICAL) ×2 IMPLANT
EVACUATOR SILICONE 100CC (DRAIN) IMPLANT
GLOVE BIOGEL PI IND STRL 7.0 (GLOVE) ×2 IMPLANT
GLOVE BIOGEL PI IND STRL 7.5 (GLOVE) ×2 IMPLANT
GLOVE BIOGEL PI INDICATOR 7.0 (GLOVE) ×4
GLOVE BIOGEL PI INDICATOR 7.5 (GLOVE) ×4
GLOVE ECLIPSE 7.5 STRL STRAW (GLOVE) ×6 IMPLANT
GLOVE SS BIOGEL STRL SZ 7 (GLOVE) ×1 IMPLANT
GLOVE SUPERSENSE BIOGEL SZ 7 (GLOVE) ×2
GLOVE SURG SS PI 7.5 STRL IVOR (GLOVE) ×3 IMPLANT
GOWN STRL REUS W/ TWL LRG LVL3 (GOWN DISPOSABLE) ×2 IMPLANT
GOWN STRL REUS W/ TWL XL LVL3 (GOWN DISPOSABLE) ×3 IMPLANT
GOWN STRL REUS W/TWL LRG LVL3 (GOWN DISPOSABLE) ×6
GOWN STRL REUS W/TWL XL LVL3 (GOWN DISPOSABLE) ×9
HEMOSTAT SNOW SURGICEL 2X4 (HEMOSTASIS) ×3 IMPLANT
KIT BASIN OR (CUSTOM PROCEDURE TRAY) ×3 IMPLANT
KIT ROOM TURNOVER OR (KITS) ×3 IMPLANT
NS IRRIG 1000ML POUR BTL (IV SOLUTION) ×6 IMPLANT
PACK PERIPHERAL VASCULAR (CUSTOM PROCEDURE TRAY) ×3 IMPLANT
PAD ARMBOARD 7.5X6 YLW CONV (MISCELLANEOUS) ×6 IMPLANT
PATCH VASCULAR VASCU GUARD 1X6 (Vascular Products) ×6 IMPLANT
PENCIL BUTTON HOLSTER BLD 10FT (ELECTRODE) ×9 IMPLANT
SET COLLECT BLD 21X3/4 12 (NEEDLE) IMPLANT
SPONGE GAUZE 4X4 12PLY (GAUZE/BANDAGES/DRESSINGS) ×3 IMPLANT
SPONGE INTESTINAL PEANUT (DISPOSABLE) ×3 IMPLANT
SPONGE LAP 18X18 X RAY DECT (DISPOSABLE) ×3 IMPLANT
STOPCOCK 4 WAY LG BORE MALE ST (IV SETS) IMPLANT
SUT ETHILON 3 0 PS 1 (SUTURE) IMPLANT
SUT PROLENE 5 0 C 1 24 (SUTURE) ×12 IMPLANT
SUT PROLENE 6 0 BV (SUTURE) ×3 IMPLANT
SUT PROLENE 7 0 BV 1 (SUTURE) ×3 IMPLANT
SUT PROLENE 7 0 BV1 MDA (SUTURE) ×9 IMPLANT
SUT VIC AB 2-0 CT1 27 (SUTURE) ×6
SUT VIC AB 2-0 CT1 TAPERPNT 27 (SUTURE) ×2 IMPLANT
SUT VIC AB 3-0 SH 27 (SUTURE) ×6
SUT VIC AB 3-0 SH 27X BRD (SUTURE) ×2 IMPLANT
SUT VICRYL 4-0 PS2 18IN ABS (SUTURE) ×6 IMPLANT
TOWEL OR 17X24 6PK STRL BLUE (TOWEL DISPOSABLE) ×6 IMPLANT
TOWEL OR 17X26 10 PK STRL BLUE (TOWEL DISPOSABLE) ×3 IMPLANT
TRAY FOLEY CATH 16FRSI W/METER (SET/KITS/TRAYS/PACK) ×3 IMPLANT
TUBING EXTENTION W/L.L. (IV SETS) IMPLANT
UNDERPAD 30X30 INCONTINENT (UNDERPADS AND DIAPERS) ×3 IMPLANT
WATER STERILE IRR 1000ML POUR (IV SOLUTION) ×3 IMPLANT

## 2013-11-11 NOTE — Op Note (Signed)
    Patient name: Alec HorsfallJohnie E Domeier MRN: [id number] DOB: 02-01-1927 Sex: male  11/11/2013 Pre-operative Diagnosis: Bilateral claudication Post-operative diagnosis:  Same Surgeon:  Jorge NyBRABHAM IV, V. WELLS Co-surgeon:  Todd early Procedure:   Bilateral common femoral, superficial femoral, and profunda femoral endarterectomy with patch angioplasty Anesthesia:  Gen. Blood Loss:  See anesthesia record Specimens:  None  Findings:  Severe occlusive disease within bilateral distal common femoral arteries.  Indications:  The patient by angiography was found to have bilateral common femoral artery stenosis.  His symptoms or to the point where they are significantly affecting his daily living.  He comes in today for femoral endarterectomy  Procedure:  The patient was identified in the holding area and taken [facility]OM 16  The patient was then placed supine on the table. general anesthesia was administered.  The patient was prepped and draped in the usual sterile fashion.  A time out was called and antibiotics were administered.  Longitudinal incisions were made in both groins.  Dr. early performed the left side I performed a right.  On both sides the common femoral profunda femoral and superficial femoral arteries were isolated.  There are multiple large branches within the profundofemoral artery.  The majority of the plaque on both sides was in the distal common femoral artery.  Once the artery was fully exposed, the patient was fully heparinized.  After the heparin had circulated, the profunda femoral superficial femoral and common femoral arteries were occluded.  A #11 blade was used to make an arteriotomy which was extended longitudinally with Potts scissors.  On the left side this came down to the proximal superficial femoral artery.  On the right side I went down onto the superficial femoral artery for approximately 1 cm.  The plaque was easily retrieved on the left side and the distal end of the plaque in  the superficial femoral artery was tacked down with 6-0 Prolene.  Patch angioplasty using a bovine pericardial patch was then completed with running 5-0 Prolene.  On the right side, the plaque extended into the 2 main profunda branches.  I removed as much of the plaque as possible and then placed multiple 70 tacking sutures to make sure the ostium of the main profunda branches remained intact.  I also placed several tacking sutures on the superficial femoral artery proximally.  Once I was satisfied with the endarterectomy, a bovine pericardial patch was used to perform patch angioplasty with a 5-0 Prolene.  Prior to completion, appropriate flushing maneuvers were performed and the anastomosis was completed.  There were excellent multiphasic Doppler signals in the profunda femoral and superficial femoral arteries bilaterally.  Once hemostasis was achieved after reversing the heparin with 50 mg protamine, the femoral sheath was reapproximated with 2-0 Vicryl.  The subcutaneous tissue was then closed with multiple layers of 203 0 Vicryl.  The skin was closed with 4-0 Vicryl and Dermabond was applied.  There were no complications   Disposition:  To PACU in stable condition.   Juleen ChinaV. Wells Royann Wildasin, M.D. [facility] [place] Office: [phone] Pager:  [phone]

## 2013-11-11 NOTE — Anesthesia Postprocedure Evaluation (Signed)
Anesthesia Post Note  Patient: Alec Walker  Procedure(s) Performed: Procedure(s) (LRB): BILATERAL FEMORAL ENDARTERECTOMIES WITH BILATERAL PATCH ANGIOPLASTIES (Bilateral)  Anesthesia type: General  Patient location: PACU  Post pain: Pain level controlled and Adequate analgesia  Post assessment: Post-op Vital signs reviewed, Patient's Cardiovascular Status Stable, Respiratory Function Stable, Patent Airway and Pain level controlled  Last Vitals:  Filed Vitals:   11/11/13 1615  BP: 145/60  Pulse: 59  Temp:   Resp: 17    Post vital signs: Reviewed and stable  Level of consciousness: awake, alert  and oriented  Complications: No apparent anesthesia complications

## 2013-11-11 NOTE — Anesthesia Procedure Notes (Signed)
Procedure Name: Intubation Date/Time: 11/11/2013 1:06 PM Performed by: Gwenyth AllegraADAMI, [place] Pre-anesthesia Checklist: Emergency Drugs available, Patient identified, Suction available, Patient being monitored and Timeout performed Patient Re-evaluated:Patient Re-evaluated prior to inductionOxygen Delivery Method: Circle system utilized Preoxygenation: Pre-oxygenation with 100% oxygen Intubation Type: IV induction Ventilation: Mask ventilation without difficulty Laryngoscope Size: Mac and 3 Grade View: Grade I Tube type: Oral Tube size: 8.0 mm Number of attempts: 1 Airway Equipment and Method: Stylet Placement Confirmation: ETT inserted through vocal cords under direct vision,  positive ETCO2 and breath sounds checked- equal and bilateral Secured at: 22 cm Tube secured with: Tape Dental Injury: Teeth and Oropharynx as per pre-operative assessment

## 2013-11-11 NOTE — Anesthesia Preprocedure Evaluation (Signed)
Anesthesia Evaluation  Patient identified by MRN, date of birth, ID band Patient awake    Reviewed: Allergy & Precautions, H&P , NPO status , Patient's Chart, lab work & pertinent test results  Airway Mallampati: II  Neck ROM: full    Dental   Pulmonary shortness of breath, COPD oxygen dependent, former smoker,          Cardiovascular hypertension, + CAD, + Peripheral Vascular Disease and + DOE  nonobstructive CAD by cath 1999. b. Myoview 04/2012: fixed inferior and inferoseptal bowel attenuation artifact, no reversible ischemia. EF 62%. Low risk scan.   Neuro/Psych CVA    GI/Hepatic PUD,   Endo/Other    Renal/GU Renal InsufficiencyRenal disease     Musculoskeletal   Abdominal   Peds  Hematology   Anesthesia Other Findings   Reproductive/Obstetrics                           Anesthesia Physical Anesthesia Plan  ASA: III  Anesthesia Plan: General   Post-op Pain Management:    Induction: Intravenous  Airway Management Planned: Oral ETT  Additional Equipment:   Intra-op Plan:   Post-operative Plan: Extubation in OR  Informed Consent: I have reviewed the patients History and Physical, chart, labs and discussed the procedure including the risks, benefits and alternatives for the proposed anesthesia with the patient or authorized representative who has indicated his/her understanding and acceptance.     Plan Discussed with: CRNA, Anesthesiologist and Surgeon  Anesthesia Plan Comments:         Anesthesia Quick Evaluation

## 2013-11-11 NOTE — Transfer of Care (Signed)
Immediate Anesthesia Transfer of Care Note  Patient: Alec Walker  Procedure(s) Performed: Procedure(s): BILATERAL FEMORAL ENDARTERECTOMIES WITH BILATERAL PATCH ANGIOPLASTIES (Bilateral)  Patient Location: PACU  Anesthesia Type:General  Level of Consciousness: awake, alert  and oriented  Airway & Oxygen Therapy: Patient Spontanous Breathing and Patient connected to nasal cannula oxygen  Post-op Assessment: Report given to PACU RN and Post -op Vital signs reviewed and stable  Post vital signs: Reviewed and stable  Complications: No apparent anesthesia complications

## 2013-11-11 NOTE — H&P (View-Only) (Signed)
Patient name: Alec Walker MRN: [id number] DOB: [birth year] Sex: male     Chief Complaint  Patient presents with  . Re-evaluation    discuss surgery again    HISTORY OF PRESENT ILLNESS: The patient is back today for further discussions of his claudication.  He was originally scheduled for bilateral femoral endarterectomy which he canceled.  I have spoken with his daughter on the telephone.  She states that he is severely limited by his walking.  He had some questions regarding the operation, and therefore I felt it was best if he came back for further discussion.  The patient has a history of right common iliac stenting many years ago.  Recent angiography revealed bilateral common femoral artery stenosis.  He states that he has significant calf claudication inside of 300 feet.  This significantly affects his lifestyle.  The patient has a history of coronary artery disease.  His most recent catheterization several years ago, showed three-vessel disease approximately 40%.  He has known carotid stenosis, and the 50-69% category.  He is asymptomatic.  He remains on a statin for hypercholesterolemia.  He is a former smoker for 60 years.  He is on Plavix.  Past Medical History  Diagnosis Date  . COPD (chronic obstructive pulmonary disease)   . CAD (coronary artery disease)     a.  nonobstructive CAD by cath 1999. b. Myoview 04/2012: fixed inferior and inferoseptal bowel attenuation artifact, no reversible ischemia. EF 62%. Low risk scan.  Marland Kitchen. HTN (hypertension)   . PUD (peptic ulcer disease)     a. s/p surgery 1976.  Marland Kitchen. Syncope     2012  . Carotid artery disease 03/22/13    a. 50-69% BICA by duplex 03/2013, repeat needed 11/2013.  Marland Kitchen. PAD (peripheral artery disease)     a. prior stenting of RCIA 1999. b. history of 90% vertebral artery narrowing, 40% subclavian artery narrowing. c. Diagnostic PV angio 08/2013 for progressive claudication - will ultimately need endarterectomy/patch angio on  bilat CFA stenosis; will also need atherectomy/PT/stenting to REIA.   . CKD (chronic kidney disease), stage III   . HLD (hyperlipidemia)   . Chronic respiratory failure     a. On home O2.  Marland Kitchen. NSVT (nonsustained ventricular tachycardia)     a. Brief NSVT (6b) during 08/2013 adm.  . Stroke     Past Surgical History  Procedure Laterality Date  . Cataract extraction    . Gastrectomy    . Transurethral resection of prostate    . Coronary angioplasty with stent placement      stent in 2003    History   Social History  . Marital Status: Married    Spouse Name: N/A    Number of Children: 3  . Years of Education: N/A   Occupational History  . retired     Social History Main Topics  . Smoking status: Former Smoker -- 1.00 packs/day for 60 years    Types: Cigarettes    Quit date: 01/06/1997  . Smokeless tobacco: Never Used  . Alcohol Use: No     Comment: hx etoh abuse in past, daughter states quit over 20 years ago  . Drug Use: No  . Sexual Activity: No   Other Topics Concern  . Not on file   Social History Narrative  . No narrative on file    Family History  Problem Relation Age of Onset  . Hypertension    . Coronary artery disease    .  Stroke Mother   . Heart disease Mother   . Hypertension Mother   . Heart attack Mother   . Heart attack Brother   . Heart disease Brother   . Heart disease Father   . Hypertension Father   . Heart attack Father     Allergies as of 10/31/2013  . (No Known Allergies)    Current Outpatient Prescriptions on File Prior to Visit  Medication Sig Dispense Refill  . aspirin EC 81 MG tablet Take 81 mg by mouth at bedtime.      Marland Kitchen azithromycin (ZITHROMAX) 250 MG tablet       . budesonide-formoterol (SYMBICORT) 160-4.5 MCG/ACT inhaler Inhale 2 puffs into the lungs 2 (two) times daily as needed.       . Cholecalciferol (VITAMIN D) 2000 UNITS tablet Take 2,000 Units by mouth at bedtime.       . clopidogrel (PLAVIX) 75 MG tablet Take 75 mg  by mouth daily.       . ferrous sulfate 325 (65 FE) MG EC tablet Take 325 mg by mouth 3 (three) times daily with meals.      . metoprolol tartrate (LOPRESSOR) 25 MG tablet Take 12.5 mg by mouth 2 (two) times daily.       . mirtazapine (REMERON) 30 MG tablet Take 30 mg by mouth at bedtime.      Marland Kitchen omeprazole (PRILOSEC) 40 MG capsule       . OXYGEN-HELIUM IN Inhale into the lungs as needed. 2 liters      . pantoprazole (PROTONIX) 40 MG tablet Take 2 tablets (80 mg total) by mouth daily.  60 tablet  1  . pravastatin (PRAVACHOL) 40 MG tablet Take 40 mg by mouth at bedtime.       . predniSONE (DELTASONE) 20 MG tablet       . temazepam (RESTORIL) 30 MG capsule Take 30 mg by mouth at bedtime. For sleep       No current facility-administered medications on file prior to visit.     REVIEW OF SYSTEMS: Please see previous office the.  No significant interval change.  PHYSICAL EXAMINATION:   Vital signs are BP 196/78  Pulse 56  Ht 5\' 7"  (1.702 m)  Wt 122 lb 9.6 oz (55.611 kg)  BMI 19.20 kg/m2  SpO2 96% General: The patient appears their stated age. HEENT:  No gross abnormalities Pulmonary:  Non labored breathing Abdomen: Soft and non-tender Musculoskeletal: There are no major deformities. Neurologic: No focal weakness or paresthesias are detected, Skin: There are no ulcer or rashes noted. Psychiatric: The patient has normal affect. Cardiovascular: There is a regular rate and rhythm without significant murmur appreciated.   Assessment: Bilateral claudication Plan: The patient will be scheduled for bilateral femoral endarterectomy on Friday, March 27.  He will stop his Plavix 5 days prior to the operation.  I had a rather extensive conversation regarding the benefits of operation.  We discussed how this would affect his current symptoms.  He strongly desires to have this addressed as his calf cramping is severely restricting his lifestyle.  Jorge Ny, M.D. [facility] [place] Office: [phone] Pager:  [phone]

## 2013-11-11 NOTE — Interval H&P Note (Signed)
History and Physical Interval Note:  11/11/2013 12:17 PM  Alec Walker  has presented today for surgery, with the diagnosis of Atherosclerosis of native arteries of the extremities with intermittent claudication  The various methods of treatment have been discussed with the patient and family. After consideration of risks, benefits and other options for treatment, the patient has consented to  Procedure(s): BILATERAL FEMORAL ENDARTERECTOMIES (Bilateral) as a surgical intervention .  The patient's history has been reviewed, patient examined, no change in status, stable for surgery.  I have reviewed the patient's chart and labs.  Questions were answered to the patient's satisfaction.     Mercedez Boule IV, V. WELLS

## 2013-11-12 LAB — BASIC METABOLIC PANEL
BUN: 27 mg/dL — ABNORMAL HIGH (ref 6–23)
CALCIUM: 8.4 mg/dL (ref 8.4–10.5)
CO2: 24 meq/L (ref 19–32)
CREATININE: 1.2 mg/dL (ref 0.50–1.35)
Chloride: 107 mEq/L (ref 96–112)
GFR calc Af Amer: 62 mL/min — ABNORMAL LOW (ref >90)
GFR, EST NON AFRICAN AMERICAN: 54 mL/min — AB (ref >90)
Glucose, Bld: 113 mg/dL — ABNORMAL HIGH (ref 70–99)
Potassium: 3.9 mEq/L (ref 3.7–5.3)
Sodium: 143 mEq/L (ref 137–147)

## 2013-11-12 LAB — CBC
HCT: 34.4 % — ABNORMAL LOW (ref 39.0–52.0)
Hemoglobin: 11.3 g/dL — ABNORMAL LOW (ref 13.0–17.0)
MCH: 32.6 pg (ref 26.0–34.0)
MCHC: 32.8 g/dL (ref 30.0–36.0)
MCV: 99.1 fL (ref 78.0–100.0)
PLATELETS: 141 10*3/uL — AB (ref 150–400)
RBC: 3.47 MIL/uL — ABNORMAL LOW (ref 4.22–5.81)
RDW: 16.2 % — ABNORMAL HIGH (ref 11.5–15.5)
WBC: 6.6 10*3/uL (ref 4.0–10.5)

## 2013-11-12 NOTE — Progress Notes (Signed)
At 1300 patient voided  150 ml x1 for 1st time void after foley dc. At this time patient denies need to urinate, slightly distended bladder patient declines to get oob to attempt to urinate again, states it "won't do any good go ahead and put that catheter in to drain it Alec Walker, Alec Walker

## 2013-11-12 NOTE — Progress Notes (Signed)
OT Cancellation Note  Patient Details Name: Alec HorsfallJohnie E Wisener MRN: [id number] DOB: 1926/12/14   Cancelled Treatment:    Reason Eval/Treat Not Completed: Other (comment) Pt is Medicare and current D/C plan is SNF. No apparent immediate acute care OT needs, therefore will defer OT to SNF. If OT eval is needed for SNF or D/C destination changes please call Acute Rehab Dept. at [phone] or text page OT at [phone].     Evette GeorgesLeonard, Makailee Nudelman Eva [phone] 11/12/2013, 5:51 PM

## 2013-11-12 NOTE — Evaluation (Signed)
Physical Therapy Evaluation Patient Details Name: Alec Walker MRN: [id number] DOB: 12/19/1926 Today's Date: 11/12/2013   History of Present Illness  pt s/p Bilateral common femoral, superficial femoral, and profunda femoral endarterectomy with patch angioplasty.  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 Pt would need 24/7 assist/supervision not only for himself but for his wife for safe d/c home. Spoke with wife and determined a ST-SNF placement upon d/c would be in the patients best interest to achieve safe mod I function for safe transition home with wife. Weekend CSW notified.    Follow Up Recommendations SNF;Supervision/Assistance - 24 hour    Equipment Recommendations  None recommended by PT    Recommendations for Other Services       Precautions / Restrictions Precautions Precautions: Fall Restrictions Weight Bearing Restrictions: No      Mobility  Bed Mobility Overal bed mobility: Needs Assistance Bed Mobility: Supine to Sit     Supine to sit: Min assist     General bed mobility comments: A for trunk elevation  Transfers Overall transfer level: Needs assistance Equipment used: Rolling walker (2 wheeled) Transfers: Sit to/from Stand Sit to Stand: Min assist         General transfer comment: max v/c's for safety and hand placement (not pulling up on walker)  Ambulation/Gait Ambulation/Gait assistance: Min assist Ambulation Distance (Feet): 200 Feet Assistive device: Rolling walker (2 wheeled) Gait Pattern/deviations: Step-through pattern;Decreased stride length     General Gait Details: mod verbal cues for walker management, pt with increased UE dependence on RW  Stairs            Wheelchair Mobility    Modified Rankin (Stroke Patients Only)       Balance Overall balance assessment: Needs assistance Sitting-balance support: Feet supported;No upper extremity  supported Sitting balance-Leahy Scale: Fair     Standing balance support: Bilateral upper extremity supported Standing balance-Leahy Scale: Poor Standing balance comment: pt needs RW for safe standing balance                     Pertinent Vitals/Pain Mild groin discomfort    Home Living Family/patient expects to be discharged to:: Skilled nursing facility Living Arrangements: Spouse/significant other               Additional Comments: Pt lives with wife and his her primary caregiver. Spoke with daughter who reports she would like [facility] upon [facility]//c for a couple of weeks until he can be independent again. She reports she can take care of her mom/his wife in the mean time. She can not provide assist to both her mom and the patient.    Prior Function Level of Independence: Independent         Comments: 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     Hand Dominance   Dominant Hand: Right    Extremity/Trunk Assessment   Upper Extremity Assessment: Overall WFL for tasks assessed           Lower Extremity Assessment: Generalized weakness (due to bilat LE surgery)      Cervical / Trunk Assessment: Normal  Communication   Communication: HOH  Cognition Arousal/Alertness: Awake/alert Behavior During Therapy: WFL for tasks assessed/performed Overall Cognitive Status: History of cognitive impairments - at baseline (pt stubborn with decrease insight to deficits)  General Comments      Exercises        Assessment/Plan    PT Assessment Patient needs continued PT services  PT Diagnosis Difficulty walking;Acute pain   PT Problem List Decreased strength;Decreased activity tolerance;Decreased balance;Decreased mobility;Decreased safety awareness;Decreased knowledge of precautions  PT Treatment Interventions DME instruction;Gait training;Stair training;Functional mobility training;Therapeutic activities;Therapeutic exercise    PT Goals (Current goals can be found in the Care Plan section) Acute Rehab PT Goals PT Goal Formulation: With patient/family Time For Goal Achievement: 11/19/13 Potential to Achieve Goals: Good    Frequency Min 3X/week   Barriers to discharge        End of Session Equipment Utilized During Treatment: Gait belt Activity Tolerance: Patient limited by fatigue Patient left: in chair;with call bell/phone within reach         Time: 1055-1135 PT Time Calculation (min): 40 min   Charges:   PT Evaluation $Initial PT Evaluation Tier I: 1 Procedure PT Treatments $Gait Training: 8-22 mins $Therapeutic Activity: 8-22 mins   PT G Codes:          Marcene Brawn 11/12/2013, 12:09 PM  Lewis Shock, PT, DPT Pager #: [phone] Office #: [phone]

## 2013-11-12 NOTE — Progress Notes (Addendum)
[facility] [place]  Subjective  - Doing well.  No complaints other than lack of sleep.  He has not voided yet.  States was out of bed yesterday.Taking PO's well.   Objective 121/47 75 97.6 F (36.4 C) (Oral) 20 99%  Intake/Output Summary (Last 24 hours) at 11/12/13 0936 Last data filed at 11/12/13 0405  Gross per 24 hour  Intake   2320 ml  Output   1775 ml  Net    545 ml    Fett warm with palpable DP and popliteal pulses bilateral. Groin incisions are C/D and soft without hematoma  Assessment/Planning: POD #1 Bilateral common femoral, superficial femoral, and profunda femoral endarterectomy with patch angioplasty Will Transfer to 2W Encourage ambulation   Clinton GallantCOLLINS, EMMA [facility] 11/12/2013 9:36 AM -- Says leg pain is already better when walking Feet warm Doppler signals Probably home Monday  Fabienne Brunsharles Analina Filla, MD [facility] [place] Office: [phone] Pager: [phone]  Laboratory Lab Results:  Recent Labs  11/11/13 2022 11/12/13 0150  WBC 7.2 6.6  HGB 11.6* 11.3*  HCT 35.5* 34.4*  PLT 132* 141*   BMET  Recent Labs  11/11/13 2022 11/12/13 0150  NA  --  143  K  --  3.9  CL  --  107  CO2  --  24  GLUCOSE  --  113*  BUN  --  27*  CREATININE 1.27 1.20  CALCIUM  --  8.4    COAG Lab Results  Component Value Date   INR 0.96 11/07/2013   INR 0.87 08/26/2013   INR 1.02 09/04/2012   No results found for this basename: PTT

## 2013-11-13 MED ORDER — TAMSULOSIN HCL 0.4 MG PO CAPS
0.4000 mg | ORAL_CAPSULE | Freq: Every day | ORAL | Status: DC
  Administered 2013-11-13 – 2013-11-14 (×2): 0.4 mg via ORAL
  Filled 2013-11-13 (×2): qty 1

## 2013-11-13 MED ORDER — OXYCODONE-ACETAMINOPHEN 5-325 MG PO TABS: 1.0000 | ORAL_TABLET | Freq: Four times a day (QID) | ORAL | Status: DC | PRN

## 2013-11-13 NOTE — Discharge Summary (Signed)
[facility] Discharge Summary   Patient ID:  Alec Walker MRN: [id number] DOB/AGE: 1927-06-21 80 y.o.  Admit date: 11/11/2013 Discharge date: 11/13/2013 Date of Surgery: 11/11/2013 Surgeon: Surgeon(s): Nada Libman, MD Larina Earthly, MD  Admission Diagnosis: Atherosclerosis of native arteries of the extremities with intermittent claudication  Discharge Diagnoses:  Atherosclerosis of native arteries of the extremities with intermittent claudication  Secondary Diagnoses: Past Medical History  Diagnosis Date  . COPD (chronic obstructive pulmonary disease)   . CAD (coronary artery disease)     a.  nonobstructive CAD by cath 1999. b. Myoview 04/2012: fixed inferior and inferoseptal bowel attenuation artifact, no reversible ischemia. EF 62%. Low risk scan.  Marland Kitchen HTN (hypertension)   . PUD (peptic ulcer disease)     a. s/p surgery 1976.  Marland Kitchen Syncope     2012  . Carotid artery disease 03/22/13    a. 50-69% BICA by duplex 03/2013, repeat needed 11/2013.  Marland Kitchen PAD (peripheral artery disease)     a. prior stenting of RCIA 1999. b. history of 90% vertebral artery narrowing, 40% subclavian artery narrowing. c. Diagnostic PV angio 08/2013 for progressive claudication - will ultimately need endarterectomy/patch angio on bilat CFA stenosis; will also need atherectomy/PT/stenting to REIA.   . CKD (chronic kidney disease), stage III   . HLD (hyperlipidemia)   . Chronic respiratory failure     a. On home O2.  Marland Kitchen NSVT (nonsustained ventricular tachycardia)     a. Brief NSVT (6b) during 08/2013 adm.  . Stroke   . Shortness of breath   . On supplemental oxygen therapy     2l    Procedure(s): BILATERAL FEMORAL ENDARTERECTOMIES WITH BILATERAL PATCH ANGIOPLASTIES  Discharged Condition: good  HPI: 80 y/o male with CC of He states that he has significant calf claudication inside of 300 feet. This significantly affects his lifestyle. He underwent Bilateral common femoral, superficial  femoral, and profunda femoral endarterectomy with patch angioplasty on 11/11/2013 by Dr. Myra Gianotti with co-surgeon Dr. Arbie Cookey.  He is ambulating, taking PO's well and has voided.  He will be discharged home and f/u in the office in 4 weeks.    Hospital Course:  Alec Walker is a 80 y.o. male is S/P Both Procedure(s): BILATERAL FEMORAL ENDARTERECTOMIES WITH BILATERAL PATCH ANGIOPLASTIES Extubated: POD # 0 Physical exam: Feet warm, palpable DP pulses bil.  Incision soft no hematoma healing well     Post-op wounds healing well Pt. Ambulating, voiding and taking PO diet without difficulty. Pt pain controlled with PO pain meds. Labs as below Complications:none  Consults:     Significant Diagnostic Studies: CBC Lab Results  Component Value Date   WBC 6.6 11/12/2013   HGB 11.3* 11/12/2013   HCT 34.4* 11/12/2013   MCV 99.1 11/12/2013   PLT 141* 11/12/2013    BMET    Component Value Date/Time   NA 143 11/12/2013 0150   K 3.9 11/12/2013 0150   CL 107 11/12/2013 0150   CO2 24 11/12/2013 0150   GLUCOSE 113* 11/12/2013 0150   BUN 27* 11/12/2013 0150   CREATININE 1.20 11/12/2013 0150   CREATININE 1.48* 06/17/2013 1018   CALCIUM 8.4 11/12/2013 0150   GFRNONAA 54* 11/12/2013 0150   GFRAA 62* 11/12/2013 0150   COAG Lab Results  Component Value Date   INR 0.96 11/07/2013   INR 0.87 08/26/2013   INR 1.02 09/04/2012     Disposition:  Discharge to :Home Discharge Orders   Future Orders Complete By  Expires   Call MD for:  redness, tenderness, or signs of infection (pain, swelling, bleeding, redness, odor or green/yellow discharge around incision site)  As directed    Call MD for:  severe or increased pain, loss or decreased feeling  in affected limb(s)  As directed    Call MD for:  temperature >100.5  As directed    Discharge instructions  As directed    Comments:     You may shower with soap and water.  Dry incision area well after bathing.   Driving Restrictions  As directed     Comments:     No driving for 2 weeks   Increase activity slowly  As directed    Comments:     Walk with assistance use walker or cane as needed   Lifting restrictions  As directed    Comments:     No lifting for 8 weeks   May shower   As directed    Resume previous diet  As directed        Medication List         aspirin EC 81 MG tablet  Take 81 mg by mouth at bedtime.     azithromycin 250 MG tablet  Commonly known as:  ZITHROMAX  Take 250 mg by mouth daily.     budesonide-formoterol 160-4.5 MCG/ACT inhaler  Commonly known as:  SYMBICORT  Inhale 2 puffs into the lungs 2 (two) times daily.     clopidogrel 75 MG tablet  Commonly known as:  PLAVIX  Take 75 mg by mouth daily.     ferrous sulfate 325 (65 FE) MG EC tablet  Take 325 mg by mouth 3 (three) times daily with meals.     metoprolol tartrate 25 MG tablet  Commonly known as:  LOPRESSOR  Take 25 mg by mouth 2 (two) times daily.     mirtazapine 30 MG tablet  Commonly known as:  REMERON  Take 30 mg by mouth at bedtime.     omeprazole 40 MG capsule  Commonly known as:  PRILOSEC     oxyCODONE-acetaminophen 5-325 MG per tablet  Commonly known as:  PERCOCET/ROXICET  Take 1 tablet by mouth every 6 (six) hours as needed for moderate pain.     OXYGEN-HELIUM IN  Inhale into the lungs as needed. 2 liters     pantoprazole 40 MG tablet  Commonly known as:  PROTONIX  Take 2 tablets (80 mg total) by mouth daily.     pravastatin 40 MG tablet  Commonly known as:  PRAVACHOL  Take 40 mg by mouth at bedtime.     predniSONE 20 MG tablet  Commonly known as:  DELTASONE     temazepam 30 MG capsule  Commonly known as:  RESTORIL  Take 30 mg by mouth at bedtime.     Vitamin D 2000 UNITS tablet  Take 2,000 Units by mouth at bedtime.       Verbal and written Discharge instructions given to the patient. Wound care per Discharge AVS F/U with Dr. Myra GianottiBrabham in 4 weeks  Signed: Clinton GallantCOLLINS, Kortny Lirette [facility] 11/13/2013, 8:05 AM

## 2013-11-13 NOTE — Progress Notes (Signed)
Pt complaint of not feeling well today, nauseated, small amt of emesis with partially digested food, medicated with zofran at this  Request to delay taking am meds until feeling better Alec Walker, [name] A

## 2013-11-13 NOTE — Progress Notes (Signed)
Pt states feeling much better, tolerated ambulation in hallway 600 feet using rolling walker and on room air sats 94%  Alec Walker, Alec Walker

## 2013-11-13 NOTE — Progress Notes (Addendum)
[facility] [place]  Subjective  - I walked around with rolling walker, eating well and have voided.  He wants to go home.   Objective 148/54 72 97.9 F (36.6 C) (Oral) 18 95%  Intake/Output Summary (Last 24 hours) at 11/13/13 0800 Last data filed at 11/13/13 [id number]  Gross per 24 hour  Intake      0 ml  Output   1575 ml  Net  -1575 ml    Feet warm, palpable DP pulses bil. Incision soft no hematoma healing well   Assessment/Planning: POD #2 Bilateral common femoral, superficial femoral, and profunda femoral endarterectomy with patch angioplasty D/C home f/u with Dr. Myra GianottiBrabham in 4 weeks   COLLINS, [facility] 11/13/2013 8:00 AM --     Feet pink and warm Groin incisions healing Urinary retention and nausea today Will keep foley today Possible d/c tomorrow  Fabienne Brunsharles Lareta Bruneau, MD [facility] [place] Office: [phone] Pager: [phone]  Laboratory Lab Results:  Recent Labs  11/11/13 2022 11/12/13 0150  WBC 7.2 6.6  HGB 11.6* 11.3*  HCT 35.5* 34.4*  PLT 132* 141*   BMET  Recent Labs  11/11/13 2022 11/12/13 0150  NA  --  143  K  --  3.9  CL  --  107  CO2  --  24  GLUCOSE  --  113*  BUN  --  27*  CREATININE 1.27 1.20  CALCIUM  --  8.4    COAG Lab Results  Component Value Date   INR 0.96 11/07/2013   INR 0.87 08/26/2013   INR 1.02 09/04/2012   No results found for this basename: PTT      Bladder scan showed 900 cc urine.  We will keep him until tomorrow place foley and start Flomax. D/C foley in am if he independently completely empties his bladder he will D/C'd home tomorrow.

## 2013-11-13 NOTE — Progress Notes (Signed)
Assisted pt to bedside to stand and void, only voided 50 ml , bladder scan performed reads greater than 999. Foley inserted per MD orders clear yellow urine returned inserted without difficult, pt tolerated well Alec Walker, Kimberly A

## 2013-11-14 DIAGNOSIS — I739 Peripheral vascular disease, unspecified: Secondary | ICD-10-CM

## 2013-11-14 NOTE — Progress Notes (Signed)
IV and tele monitor d/c at this time; pt to d/c home with daughter and wife; pt given d/c instructions; will cont. To monitor.

## 2013-11-14 NOTE — Progress Notes (Signed)
Physical Therapy Treatment Patient Details Name: KONSTANTIN LEHNEN MRN: [id number] DOB: 11/23/1926 Today's Date: 11/14/2013    History of Present Illness pt s/p Bilateral common femoral, superficial femoral, and profunda femoral endarterectomy with patch angioplasty.    PT Comments    Pt has met goals and is ready for discharge.  Follow Up Recommendations  Home health PT     Equipment Recommendations  None recommended by PT    Recommendations for Other Services       Precautions / Restrictions Precautions Precautions: Fall Restrictions Weight Bearing Restrictions: No    Mobility  Bed Mobility Overal bed mobility: Needs Assistance Bed Mobility: Supine to Sit     Supine to sit: Supervision     General bed mobility comments: no assist needed  Transfers Overall transfer level: Needs assistance Equipment used: None Transfers: Sit to/from Stand Sit to Stand: Min guard         General transfer comment: stood safely without assist  Ambulation/Gait Ambulation/Gait assistance: Min guard Ambulation Distance (Feet): 280 Feet Assistive device: None Gait Pattern/deviations: Step-through pattern   Gait velocity interpretation: Below normal speed for age/gender General Gait Details: short but steady steps   Stairs Stairs: Yes Stairs assistance: Supervision Stair Management: One rail Right;Alternating pattern;Forwards Number of Stairs: 7    Wheelchair Mobility    Modified Rankin (Stroke Patients Only)       Balance Overall balance assessment: Needs assistance Sitting-balance support: Feet supported;No upper extremity supported Sitting balance-Leahy Scale: Fair     Standing balance support: No upper extremity supported;During functional activity Standing balance-Leahy Scale: Fair                      Cognition Arousal/Alertness: Awake/alert Behavior During Therapy: WFL for tasks assessed/performed Overall Cognitive Status: History of  cognitive impairments - at baseline                      Exercises      General Comments        Pertinent Vitals/Pain     Home Living                      Prior Function            PT Goals (current goals can now be found in the care plan section) Acute Rehab PT Goals Patient Stated Goal: not stated PT Goal Formulation: With patient/family Time For Goal Achievement: 11/19/13 Potential to Achieve Goals: Good Progress towards PT goals: Progressing toward goals    Frequency  Min 3X/week    PT Plan Current plan remains appropriate    End of Session   Activity Tolerance: Patient tolerated treatment well Patient left: in chair;with call bell/phone within reach     Time: 1358-1421 PT Time Calculation (min): 23 min  Charges:  $Gait Training: 8-22 mins $Therapeutic Activity: 38-52 mins                    G Codes:      Eastyn Skalla, Tessie Fass 11/14/2013, 4:53 PM 11/14/2013  Donnella Sham, PT [phone]  (pager)

## 2013-11-14 NOTE — Care Management Note (Addendum)
    Page 1 of 1   11/14/2013     4:20:34 PM   CARE MANAGEMENT NOTE 11/14/2013  Patient:  Alec Walker,Alec E   Account Number:  [id number]  Date Initiated:  11/14/2013  Documentation initiated by:  Syrina Wake  Subjective/Objective Assessment:   PT S/P BILATERAL FEMORAL ENDARTERECTOMY.  PTA, PT RESIDED AT HOME WITH SPOUSE.     Action/Plan:   PT FOR DC HOME TODAY.  P.T. RECOMMENDING HH FOLLOW UP; SPOKE WITH WIFE, [name], PER PT REQUEST TO DISCUSS HH SET UP. REFERRAL TO [facility], PER WIFE CHOICE.  START OF CARE 24-48H POST DC DATE.  PT HAS RW AT HOME.   Anticipated DC Date:  11/14/2013   Anticipated DC Plan:  HOME W HOME HEALTH SERVICES      DC Planning Services  CM consult      [facility]   Choice offered to / List presented to:  C-3 Spouse        HH arranged  HH-1 RN  HH-2 PT      [facility]  [facility]   Status of service:  Completed, signed off Medicare Important Message given?   (If response is "NO", the following Medicare IM given date fields will be blank) Date Medicare IM given:   Date Additional Medicare IM given:    Discharge Disposition:  HOME W HOME HEALTH SERVICES  Per UR Regulation:  Reviewed for med. necessity/level of care/duration of stay  If discussed at Long Length of Stay Meetings, dates discussed:    Comments:

## 2013-11-14 NOTE — Progress Notes (Signed)
[facility] [place]  Subjective  - No nausea or vomiting.  He wants to go home today.  Foley D/C's this am.   Objective 117/65 73 97.5 F (36.4 C) (Oral) 18 97%  Intake/Output Summary (Last 24 hours) at 11/14/13 0746 Last data filed at 11/14/13 0634  Gross per 24 hour  Intake      0 ml  Output   1900 ml  Net  -1900 ml    Incisions in both groin areas are clean and dry. Palpable DP pulses equal bil.    Assessment/Planning: POD #3 Bilateral common femoral, superficial femoral, and profunda femoral endarterectomy with patch angioplasty  D/C home once he is voiding independently f/u with Dr. Myra GianottiBrabham in 4 weeks    Thomasena EdisCOLLINS, [facility] 11/14/2013 7:46 AM --  Laboratory Lab Results:  Recent Labs  11/11/13 2022 11/12/13 0150  WBC 7.2 6.6  HGB 11.6* 11.3*  HCT 35.5* 34.4*  PLT 132* 141*   BMET  Recent Labs  11/11/13 2022 11/12/13 0150  NA  --  143  K  --  3.9  CL  --  107  CO2  --  24  GLUCOSE  --  113*  BUN  --  27*  CREATININE 1.27 1.20  CALCIUM  --  8.4    COAG Lab Results  Component Value Date   INR 0.96 11/07/2013   INR 0.87 08/26/2013   INR 1.02 09/04/2012   No results found for this basename: PTT

## 2013-11-14 NOTE — Progress Notes (Signed)
VASCULAR LAB PRELIMINARY  ARTERIAL  ABI completed:    RIGHT    LEFT    PRESSURE WAVEFORM  PRESSURE WAVEFORM  BRACHIAL  triphasic BRACHIAL 126 triphasic  DP 124 Dampened monophasic DP 126 Severely dampened monophasic  AT   AT    PT 127 monophasic PT 131 triphasic  PER   PER    GREAT TOE  NA GREAT TOE  NA    RIGHT LEFT  ABI >1.0 >1.0     Kaylinn Dedic, RVT 11/14/2013, 1:21 PM

## 2013-11-14 NOTE — Progress Notes (Signed)
Utilization Review Completed.Alec Walker T3/30/2015  

## 2013-11-15 ENCOUNTER — Encounter (HOSPITAL_COMMUNITY): Payer: Self-pay | Admitting: Surgery

## 2013-11-15 ENCOUNTER — Other Ambulatory Visit: Payer: Self-pay | Admitting: *Deleted

## 2013-11-15 DIAGNOSIS — T8149XA Infection following a procedure, other surgical site, initial encounter: Secondary | ICD-10-CM

## 2013-11-15 MED ORDER — PREDNISONE 20 MG PO TABS: 20.0000 mg | ORAL_TABLET | Freq: Every day | ORAL | Status: DC

## 2013-11-15 MED ORDER — AZITHROMYCIN 250 MG PO TABS: 250.0000 mg | ORAL_TABLET | Freq: Every day | ORAL | Status: DC

## 2013-11-15 MED ORDER — OMEPRAZOLE 40 MG PO CPDR: 40.0000 mg | DELAYED_RELEASE_CAPSULE | Freq: Every day | ORAL | Status: DC

## 2013-11-16 NOTE — Discharge Summary (Signed)
I agree with the above  Alec Walker 

## 2013-11-17 ENCOUNTER — Encounter: Payer: Self-pay | Admitting: Surgery

## 2013-11-17 ENCOUNTER — Telehealth: Payer: Self-pay | Admitting: Surgery

## 2013-11-17 NOTE — Telephone Encounter (Addendum)
Message copied by Fredrich BirksMILLIKAN, DANA P on Thu Nov 17, 2013  4:34 PM ------      Message from: Sharee PimpleMCCHESNEY, MARILYN K      Created: Tue Nov 15, 2013  2:20 PM      Regarding: schedule       This is the one we talked about, TFE said to see 2 weeks postop      ----- Message -----         From: Nada LibmanVance W Brabham, MD         Sent: 11/11/2013   3:31 PM           To: [facility]vs Charge Pool            11/11/2013:                        Surgeon:  Jorge NyBRABHAM IV, V. WELLS      Co-surgeon:  Todd early      Procedure:   Bilateral common femoral, superficial femoral, and profunda femoral endarterectomy with patch angioplasty       ------  11/17/13: lm for pt re appt, dpm

## 2013-11-21 ENCOUNTER — Encounter: Payer: Medicare Other | Admitting: Surgery

## 2013-11-25 ENCOUNTER — Encounter: Payer: Self-pay | Admitting: Surgery

## 2013-11-28 ENCOUNTER — Encounter: Payer: Self-pay | Admitting: Surgery

## 2013-11-28 ENCOUNTER — Ambulatory Visit (INDEPENDENT_AMBULATORY_CARE_PROVIDER_SITE_OTHER): Payer: Self-pay | Admitting: Surgery

## 2013-11-28 VITALS — BP 165/70 | HR 60 | Ht 66.0 in | Wt 124.5 lb

## 2013-11-28 DIAGNOSIS — I739 Peripheral vascular disease, unspecified: Secondary | ICD-10-CM

## 2013-11-28 DIAGNOSIS — Z48812 Encounter for surgical aftercare following surgery on the circulatory system: Secondary | ICD-10-CM

## 2013-11-28 NOTE — Progress Notes (Signed)
The patient is here today for followup.  On 11/11/2013, he underwent bilateral femoral endarterectomy for bilateral claudication.  His postoperative course was uncomplicated.  He is here today for followup.  He has no complaints today.  He has noticed a significant improvement in his ability to ambulate.  On examination, his incisions are healing nicely without evidence of separation.  Both feet are warm and well-perfused.  Overall the patient has done very well since his operation.  All schedule him back for repeat followup in 3 months

## 2013-11-29 NOTE — Addendum Note (Signed)
Addended by: Adria DillELDRIDGE-LEWIS, Neev Mcmains L on: 11/29/2013 02:13 PM   Modules accepted: Orders

## 2013-12-13 ENCOUNTER — Telehealth: Payer: Self-pay | Admitting: *Deleted

## 2013-12-13 DIAGNOSIS — I739 Peripheral vascular disease, unspecified: Secondary | ICD-10-CM

## 2013-12-13 NOTE — Telephone Encounter (Signed)
Message copied by Marella BileVOGEL, Jacquilyn Seldon W. on Tue Dec 13, 2013  8:47 AM ------      Message from: Runell GessBERRY, JONATHAN J      Created: Tue Dec 13, 2013 12:51 AM      Regarding: ROV       Mr Alec Walker has not seen me back since Feb or since [facility]. When is his next ROV and LEAs??            JJB ------

## 2013-12-13 NOTE — Telephone Encounter (Signed)
Order placed for repeat lower ext dopplers. I will have schedulers schedule a follow up appt with Dr Allyson SabalBerry

## 2013-12-15 ENCOUNTER — Telehealth: Payer: Self-pay

## 2013-12-15 MED ORDER — CLOPIDOGREL BISULFATE 75 MG PO TABS: 75.0000 mg | ORAL_TABLET | Freq: Every day | ORAL | Status: DC

## 2013-12-15 NOTE — Telephone Encounter (Signed)
Rx was sent to pharmacy electronically. 

## 2013-12-20 ENCOUNTER — Inpatient Hospital Stay (HOSPITAL_COMMUNITY): Admission: RE | Admit: 2013-12-20 | Payer: Medicare Other | Source: Ambulatory Visit

## 2013-12-26 ENCOUNTER — Other Ambulatory Visit: Payer: Self-pay | Admitting: *Deleted

## 2014-03-24 ENCOUNTER — Encounter: Payer: Self-pay | Admitting: Surgery

## 2014-03-27 ENCOUNTER — Ambulatory Visit (INDEPENDENT_AMBULATORY_CARE_PROVIDER_SITE_OTHER)
Admission: RE | Admit: 2014-03-27 | Discharge: 2014-03-27 | Disposition: A | Discharge status: Home / Self Care | Payer: Medicare Other | Source: Ambulatory Visit | Attending: Surgery | Admitting: Surgery

## 2014-03-27 ENCOUNTER — Ambulatory Visit (INDEPENDENT_AMBULATORY_CARE_PROVIDER_SITE_OTHER): Payer: Medicare Other | Admitting: Surgery

## 2014-03-27 ENCOUNTER — Encounter: Payer: Self-pay | Admitting: Surgery

## 2014-03-27 ENCOUNTER — Ambulatory Visit (HOSPITAL_COMMUNITY)
Admission: RE | Admit: 2014-03-27 | Discharge: 2014-03-27 | Disposition: A | Discharge status: Home / Self Care | Payer: Medicare Other | Source: Ambulatory Visit | Attending: Surgery | Admitting: Surgery

## 2014-03-27 VITALS — BP 188/66 | HR 56 | Ht 66.0 in | Wt 119.4 lb

## 2014-03-27 DIAGNOSIS — E785 Hyperlipidemia, unspecified: Secondary | ICD-10-CM | POA: Insufficient documentation

## 2014-03-27 DIAGNOSIS — I739 Peripheral vascular disease, unspecified: Secondary | ICD-10-CM

## 2014-03-27 DIAGNOSIS — I10 Essential (primary) hypertension: Secondary | ICD-10-CM | POA: Insufficient documentation

## 2014-03-27 DIAGNOSIS — Z48812 Encounter for surgical aftercare following surgery on the circulatory system: Secondary | ICD-10-CM

## 2014-03-27 NOTE — Progress Notes (Signed)
Patient name: Alec Walker MRN: [id number] DOB: 01-02-1927 Sex: male     Chief Complaint  Patient presents with  . Re-evaluation    3 month f/u     HISTORY OF PRESENT ILLNESS: The patient is here today for followup. On 11/11/2013, he underwent bilateral femoral endarterectomy for bilateral claudication. His postoperative course was uncomplicated. He is here today for followup.  He states that he still has difficulty with ambulation.  He states that his left leg it to nominal.  He cannot walk approximately 300 feet before he has to stop secondary to back pain.   Past Medical History  Diagnosis Date  . COPD (chronic obstructive pulmonary disease)   . CAD (coronary artery disease)     a.  nonobstructive CAD by cath 1999. b. Myoview 04/2012: fixed inferior and inferoseptal bowel attenuation artifact, no reversible ischemia. EF 62%. Low risk scan.  Marland Kitchen HTN (hypertension)   . PUD (peptic ulcer disease)     a. s/p surgery 1976.  Marland Kitchen Syncope     2012  . Carotid artery disease 03/22/13    a. 50-69% BICA by duplex 03/2013, repeat needed 11/2013.  Marland Kitchen PAD (peripheral artery disease)     a. prior stenting of RCIA 1999. b. history of 90% vertebral artery narrowing, 40% subclavian artery narrowing. c. Diagnostic PV angio 08/2013 for progressive claudication - will ultimately need endarterectomy/patch angio on bilat CFA stenosis; will also need atherectomy/PT/stenting to REIA.   . CKD (chronic kidney disease), stage III   . HLD (hyperlipidemia)   . Chronic respiratory failure     a. On home O2.  Marland Kitchen NSVT (nonsustained ventricular tachycardia)     a. Brief NSVT (6b) during 08/2013 adm.  . Stroke   . Shortness of breath   . On supplemental oxygen therapy     2l    Past Surgical History  Procedure Laterality Date  . Cataract extraction    . Gastrectomy    . Transurethral resection of prostate    . Coronary angioplasty with stent placement      stent in 2003; history of right common iliac  artery stent hx  . Endarterectomy femoral Bilateral 11/11/2013    Procedure: BILATERAL FEMORAL ENDARTERECTOMIES WITH BILATERAL PATCH ANGIOPLASTIES;  Surgeon: Nada Libman, MD;  Location: [facility] OR;  Service: Vascular;  Laterality: Bilateral;    History   Social History  . Marital Status: Married    Spouse Name: N/A    Number of Children: 3  . Years of Education: N/A   Occupational History  . retired     Social History Main Topics  . Smoking status: Former Smoker -- 1.00 packs/day for 60 years    Types: Cigarettes    Quit date: 01/06/1997  . Smokeless tobacco: Never Used  . Alcohol Use: No     Comment: hx etoh abuse in past, daughter states quit over 20 years ago  . Drug Use: No  . Sexual Activity: No   Other Topics Concern  . Not on file   Social History Narrative  . No narrative on file    Family History  Problem Relation Age of Onset  . Hypertension    . Coronary artery disease    . Stroke Mother   . Heart disease Mother   . Hypertension Mother   . Heart attack Mother   . Heart attack Brother   . Heart disease Brother   . Heart disease Father   . Hypertension Father   .  Heart attack Father     Allergies as of 03/27/2014  . (No Known Allergies)    Current Outpatient Prescriptions on File Prior to Visit  Medication Sig Dispense Refill  . aspirin EC 81 MG tablet Take 81 mg by mouth at bedtime.      Marland Kitchen. azithromycin (ZITHROMAX) 250 MG tablet Take 1 tablet (250 mg total) by mouth daily.  16 each  0  . budesonide-formoterol (SYMBICORT) 160-4.5 MCG/ACT inhaler Inhale 2 puffs into the lungs 2 (two) times daily.       . Cholecalciferol (VITAMIN D) 2000 UNITS tablet Take 2,000 Units by mouth at bedtime.       . clopidogrel (PLAVIX) 75 MG tablet Take 1 tablet (75 mg total) by mouth daily.  30 tablet  10  . ferrous sulfate 325 (65 FE) MG EC tablet Take 325 mg by mouth 3 (three) times daily with meals.      . metoprolol tartrate (LOPRESSOR) 25 MG tablet Take 25 mg by  mouth 2 (two) times daily.       . mirtazapine (REMERON) 30 MG tablet Take 30 mg by mouth at bedtime.      Marland Kitchen. oxyCODONE-acetaminophen (PERCOCET/ROXICET) 5-325 MG per tablet Take 1 tablet by mouth every 6 (six) hours as needed for moderate pain.  30 tablet  0  . OXYGEN-HELIUM IN Inhale into the lungs as needed. 2 liters      . pantoprazole (PROTONIX) 40 MG tablet Take 2 tablets (80 mg total) by mouth daily.  60 tablet  1  . pravastatin (PRAVACHOL) 40 MG tablet Take 40 mg by mouth at bedtime.       . predniSONE (DELTASONE) 20 MG tablet Take 1 tablet (20 mg total) by mouth daily with breakfast.  16 tablet  0  . temazepam (RESTORIL) 30 MG capsule Take 30 mg by mouth at bedtime.        No current facility-administered medications on file prior to visit.     REVIEW OF SYSTEMS: Cardiovascular: Positive for shortness of breath when lying flat and with exertion.  Positive for pain in legs when walking.  Positive for leg swelling Pulmonary:  Positive for productive cough, no asthma or wheezing. Neurologic: Positive for weakness, no paresthesias, aphasia, or amaurosis.  Positive for dizziness. Hematologic: No bleeding problems or clotting disorders. Musculoskeletal: No joint pain or joint swelling. Gastrointestinal: No blood in stool or hematemesis Genitourinary: No dysuria or hematuria. Psychiatric:: No history of major depression. Integumentary: No rashes or ulcers. Constitutional: No fever, positive chills.  PHYSICAL EXAMINATION:   Vital signs are BP 188/66  Pulse 56  Ht 5\' 6"  (1.676 m)  Wt 119 lb 6.4 oz (54.159 kg)  BMI 19.28 kg/m2  SpO2 97% General: The patient appears their stated age. HEENT:  No gross abnormalities Pulmonary:  Non labored breathing Abdomen: Soft and non-tender Musculoskeletal: There are no major deformities. Neurologic: No focal weakness or paresthesias are detected, Skin: There are no ulcer or rashes noted. Psychiatric: The patient has normal  affect. Cardiovascular: There is a regular rate and rhythm without significant murmur appreciated.  Palpable pedal pulses bilaterally.  Both groin incisions have healed nicely.   Diagnostic Studies Ultrasound was ordered and reviewed today.  The patient has an ankle-brachial index of 1.1 on the right with triphasic waveforms.  1.05 on the left with triphasic waveforms.  Duplex shows widely patent endarterectomy site.  Velocity in the right groin at its maximum is 203.  Assessment: Status post bilateral femoral endarterectomy Plan:  From a vascular perspective I believe the patient is doing very well.  I do not feel his current symptoms are related to vascular insufficiency.  I suspect that he has low-back degenerative disc disease which is causing his symptoms.  He doesn't endorse numbness going down his left leg.  I think he needs to have this evaluated as it is most likely the source of his complaint today.  The patient is followed by [facility].  I will see him on a when necessary basis.  He'll contact me if he has any further questions  V. Charlena Cross, M.D. [facility] [place] Office: [phone] Pager:  [phone]

## 2014-04-14 ENCOUNTER — Other Ambulatory Visit: Payer: Self-pay | Admitting: Physician Assistant

## 2014-04-21 ENCOUNTER — Other Ambulatory Visit: Payer: Self-pay | Admitting: *Deleted

## 2014-04-21 MED ORDER — CLOPIDOGREL BISULFATE 75 MG PO TABS: 75.0000 mg | ORAL_TABLET | Freq: Every day | ORAL | Status: DC

## 2014-04-21 NOTE — Telephone Encounter (Signed)
Rx refill request for 90 day supply received, Rx was sent into pharmacy for 90 day supply

## 2014-06-10 ENCOUNTER — Inpatient Hospital Stay (HOSPITAL_COMMUNITY)
Admission: EM | Admit: 2014-06-10 | Discharge: 2014-06-13 | DRG: 871 | Disposition: A | Discharge status: Home Health | Payer: Medicare Other | Attending: Internal Medicine | Admitting: Internal Medicine

## 2014-06-10 ENCOUNTER — Encounter (HOSPITAL_COMMUNITY): Payer: Self-pay | Admitting: Emergency Medicine

## 2014-06-10 ENCOUNTER — Emergency Department (HOSPITAL_COMMUNITY): Payer: Medicare Other

## 2014-06-10 DIAGNOSIS — R0602 Shortness of breath: Secondary | ICD-10-CM | POA: Diagnosis present

## 2014-06-10 DIAGNOSIS — D6959 Other secondary thrombocytopenia: Secondary | ICD-10-CM | POA: Diagnosis present

## 2014-06-10 DIAGNOSIS — R079 Chest pain, unspecified: Secondary | ICD-10-CM

## 2014-06-10 DIAGNOSIS — Z823 Family history of stroke: Secondary | ICD-10-CM

## 2014-06-10 DIAGNOSIS — R6520 Severe sepsis without septic shock: Secondary | ICD-10-CM | POA: Diagnosis present

## 2014-06-10 DIAGNOSIS — R0789 Other chest pain: Secondary | ICD-10-CM

## 2014-06-10 DIAGNOSIS — E440 Moderate protein-calorie malnutrition: Secondary | ICD-10-CM | POA: Diagnosis present

## 2014-06-10 DIAGNOSIS — I129 Hypertensive chronic kidney disease with stage 1 through stage 4 chronic kidney disease, or unspecified chronic kidney disease: Secondary | ICD-10-CM | POA: Diagnosis present

## 2014-06-10 DIAGNOSIS — Z903 Acquired absence of stomach [part of]: Secondary | ICD-10-CM | POA: Diagnosis present

## 2014-06-10 DIAGNOSIS — Z23 Encounter for immunization: Secondary | ICD-10-CM

## 2014-06-10 DIAGNOSIS — Z8249 Family history of ischemic heart disease and other diseases of the circulatory system: Secondary | ICD-10-CM | POA: Diagnosis not present

## 2014-06-10 DIAGNOSIS — Z681 Body mass index (BMI) 19 or less, adult: Secondary | ICD-10-CM | POA: Diagnosis not present

## 2014-06-10 DIAGNOSIS — Z8673 Personal history of transient ischemic attack (TIA), and cerebral infarction without residual deficits: Secondary | ICD-10-CM | POA: Diagnosis not present

## 2014-06-10 DIAGNOSIS — Z87891 Personal history of nicotine dependence: Secondary | ICD-10-CM

## 2014-06-10 DIAGNOSIS — E785 Hyperlipidemia, unspecified: Secondary | ICD-10-CM | POA: Diagnosis present

## 2014-06-10 DIAGNOSIS — I2510 Atherosclerotic heart disease of native coronary artery without angina pectoris: Secondary | ICD-10-CM | POA: Diagnosis present

## 2014-06-10 DIAGNOSIS — D649 Anemia, unspecified: Secondary | ICD-10-CM | POA: Diagnosis present

## 2014-06-10 DIAGNOSIS — R0600 Dyspnea, unspecified: Secondary | ICD-10-CM

## 2014-06-10 DIAGNOSIS — I739 Peripheral vascular disease, unspecified: Secondary | ICD-10-CM | POA: Diagnosis present

## 2014-06-10 DIAGNOSIS — J189 Pneumonia, unspecified organism: Secondary | ICD-10-CM | POA: Diagnosis present

## 2014-06-10 DIAGNOSIS — Z9981 Dependence on supplemental oxygen: Secondary | ICD-10-CM

## 2014-06-10 DIAGNOSIS — A3211 Listerial meningitis: Secondary | ICD-10-CM

## 2014-06-10 DIAGNOSIS — A419 Sepsis, unspecified organism: Principal | ICD-10-CM | POA: Diagnosis present

## 2014-06-10 DIAGNOSIS — E43 Unspecified severe protein-calorie malnutrition: Secondary | ICD-10-CM | POA: Insufficient documentation

## 2014-06-10 DIAGNOSIS — R7989 Other specified abnormal findings of blood chemistry: Secondary | ICD-10-CM

## 2014-06-10 DIAGNOSIS — N183 Chronic kidney disease, stage 3 unspecified: Secondary | ICD-10-CM

## 2014-06-10 DIAGNOSIS — Z7982 Long term (current) use of aspirin: Secondary | ICD-10-CM | POA: Diagnosis not present

## 2014-06-10 DIAGNOSIS — R778 Other specified abnormalities of plasma proteins: Secondary | ICD-10-CM

## 2014-06-10 DIAGNOSIS — J441 Chronic obstructive pulmonary disease with (acute) exacerbation: Secondary | ICD-10-CM | POA: Diagnosis present

## 2014-06-10 DIAGNOSIS — J9621 Acute and chronic respiratory failure with hypoxia: Secondary | ICD-10-CM | POA: Diagnosis present

## 2014-06-10 DIAGNOSIS — R531 Weakness: Secondary | ICD-10-CM

## 2014-06-10 DIAGNOSIS — R0609 Other forms of dyspnea: Secondary | ICD-10-CM

## 2014-06-10 DIAGNOSIS — E162 Hypoglycemia, unspecified: Secondary | ICD-10-CM

## 2014-06-10 DIAGNOSIS — J440 Chronic obstructive pulmonary disease with acute lower respiratory infection: Secondary | ICD-10-CM | POA: Diagnosis present

## 2014-06-10 LAB — BASIC METABOLIC PANEL
Anion gap: 13 (ref 5–15)
BUN: 22 mg/dL (ref 6–23)
CO2: 23 mEq/L (ref 19–32)
Calcium: 9.1 mg/dL (ref 8.4–10.5)
Chloride: 108 mEq/L (ref 96–112)
Creatinine, Ser: 1.38 mg/dL — ABNORMAL HIGH (ref 0.50–1.35)
GFR calc non Af Amer: 45 mL/min — ABNORMAL LOW (ref >90)
GFR, EST AFRICAN AMERICAN: 53 mL/min — AB (ref >90)
Glucose, Bld: 169 mg/dL — ABNORMAL HIGH (ref 70–99)
POTASSIUM: 4.5 meq/L (ref 3.7–5.3)
Sodium: 144 mEq/L (ref 137–147)

## 2014-06-10 LAB — BLOOD GAS, ARTERIAL
Acid-base deficit: 3.2 mmol/L — ABNORMAL HIGH (ref 0.0–2.0)
BICARBONATE: 20.3 meq/L (ref 20.0–24.0)
Drawn by: 331471
O2 Content: 8 L/min
O2 Saturation: 93.9 %
PH ART: 7.402 (ref 7.350–7.450)
Patient temperature: 98.6
TCO2: 18.2 mmol/L (ref 0–100)
pCO2 arterial: 33.3 mmHg — ABNORMAL LOW (ref 35.0–45.0)
pO2, Arterial: 69.5 mmHg — ABNORMAL LOW (ref 80.0–100.0)

## 2014-06-10 LAB — HEPATIC FUNCTION PANEL
ALBUMIN: 3 g/dL — AB (ref 3.5–5.2)
ALT: 11 U/L (ref 0–53)
AST: 17 U/L (ref 0–37)
Alkaline Phosphatase: 50 U/L (ref 39–117)
Bilirubin, Direct: <0.2 mg/dL (ref 0.0–0.3)
Total Protein: 6 g/dL (ref 6.0–8.3)

## 2014-06-10 LAB — CBC
HEMATOCRIT: 40.8 % (ref 39.0–52.0)
Hemoglobin: 12.8 g/dL — ABNORMAL LOW (ref 13.0–17.0)
MCH: 31.1 pg (ref 26.0–34.0)
MCHC: 31.4 g/dL (ref 30.0–36.0)
MCV: 99 fL (ref 78.0–100.0)
Platelets: 181 10*3/uL (ref 150–400)
RBC: 4.12 MIL/uL — ABNORMAL LOW (ref 4.22–5.81)
RDW: 14.3 % (ref 11.5–15.5)
WBC: 8.1 10*3/uL (ref 4.0–10.5)

## 2014-06-10 LAB — LIPASE, BLOOD: Lipase: 12 U/L (ref 11–59)

## 2014-06-10 LAB — TROPONIN I

## 2014-06-10 LAB — I-STAT TROPONIN, ED: Troponin i, poc: 0.01 ng/mL (ref 0.00–0.08)

## 2014-06-10 LAB — STREP PNEUMONIAE URINARY ANTIGEN: Strep Pneumo Urinary Antigen: NEGATIVE

## 2014-06-10 LAB — HEMOGLOBIN A1C
HEMOGLOBIN A1C: 5.5 % (ref <5.7)
Mean Plasma Glucose: 111 mg/dL (ref <117)

## 2014-06-10 LAB — EXPECTORATED SPUTUM ASSESSMENT W REFEX TO RESP CULTURE

## 2014-06-10 LAB — HIV ANTIBODY (ROUTINE TESTING W REFLEX): HIV: NONREACTIVE

## 2014-06-10 LAB — MRSA PCR SCREENING: MRSA BY PCR: POSITIVE — AB

## 2014-06-10 LAB — PRO B NATRIURETIC PEPTIDE: Pro B Natriuretic peptide (BNP): 398.9 pg/mL (ref 0–450)

## 2014-06-10 MED ORDER — MORPHINE SULFATE 2 MG/ML IJ SOLN: 1.0000 mg | INTRAMUSCULAR | Status: DC | PRN

## 2014-06-10 MED ORDER — PRAVASTATIN SODIUM 40 MG PO TABS
40.0000 mg | ORAL_TABLET | Freq: Every day | ORAL | Status: DC
  Administered 2014-06-11 – 2014-06-12 (×3): 40 mg via ORAL
  Filled 2014-06-10 (×5): qty 1

## 2014-06-10 MED ORDER — DEXTROSE 5 % IV SOLN
1.0000 g | INTRAVENOUS | Status: DC
  Administered 2014-06-11 – 2014-06-12 (×2): 1 g via INTRAVENOUS
  Filled 2014-06-10 (×3): qty 10

## 2014-06-10 MED ORDER — ALBUTEROL SULFATE (2.5 MG/3ML) 0.083% IN NEBU: 2.5000 mg | INHALATION_SOLUTION | RESPIRATORY_TRACT | Status: DC | PRN

## 2014-06-10 MED ORDER — GUAIFENESIN-CODEINE 100-10 MG/5ML PO SOLN: 5.0000 mL | ORAL | Status: DC | PRN

## 2014-06-10 MED ORDER — DEXTROSE 5 % IV SOLN
1.0000 g | Freq: Once | INTRAVENOUS | Status: AC
  Administered 2014-06-10: 1 g via INTRAVENOUS
  Filled 2014-06-10: qty 10

## 2014-06-10 MED ORDER — METOPROLOL TARTRATE 25 MG PO TABS
25.0000 mg | ORAL_TABLET | Freq: Two times a day (BID) | ORAL | Status: DC
  Administered 2014-06-10 – 2014-06-13 (×6): 25 mg via ORAL
  Filled 2014-06-10 (×7): qty 1

## 2014-06-10 MED ORDER — ASPIRIN EC 81 MG PO TBEC
81.0000 mg | DELAYED_RELEASE_TABLET | Freq: Every day | ORAL | Status: DC
  Administered 2014-06-10 – 2014-06-12 (×3): 81 mg via ORAL
  Filled 2014-06-10 (×4): qty 1

## 2014-06-10 MED ORDER — ONDANSETRON HCL 4 MG/2ML IJ SOLN: 4.0000 mg | Freq: Three times a day (TID) | INTRAMUSCULAR | Status: DC | PRN

## 2014-06-10 MED ORDER — TEMAZEPAM 15 MG PO CAPS
30.0000 mg | ORAL_CAPSULE | Freq: Every day | ORAL | Status: DC
  Administered 2014-06-11 – 2014-06-12 (×3): 30 mg via ORAL
  Filled 2014-06-10 (×3): qty 2

## 2014-06-10 MED ORDER — IPRATROPIUM-ALBUTEROL 0.5-2.5 (3) MG/3ML IN SOLN
3.0000 mL | RESPIRATORY_TRACT | Status: DC
  Administered 2014-06-10 – 2014-06-12 (×12): 3 mL via RESPIRATORY_TRACT
  Filled 2014-06-10 (×12): qty 3

## 2014-06-10 MED ORDER — DEXTROSE 5 % IV SOLN
500.0000 mg | Freq: Once | INTRAVENOUS | Status: DC
  Filled 2014-06-10: qty 500

## 2014-06-10 MED ORDER — IPRATROPIUM-ALBUTEROL 0.5-2.5 (3) MG/3ML IN SOLN
3.0000 mL | Freq: Once | RESPIRATORY_TRACT | Status: AC
Start: 2014-06-10 — End: 2014-06-10
  Administered 2014-06-10: 3 mL via RESPIRATORY_TRACT
  Filled 2014-06-10: qty 3

## 2014-06-10 MED ORDER — SENNA 8.6 MG PO TABS
1.0000 | ORAL_TABLET | Freq: Two times a day (BID) | ORAL | Status: DC
  Administered 2014-06-10 – 2014-06-13 (×6): 8.6 mg via ORAL
  Filled 2014-06-10 (×6): qty 1

## 2014-06-10 MED ORDER — METHYLPREDNISOLONE SODIUM SUCC 125 MG IJ SOLR
60.0000 mg | Freq: Two times a day (BID) | INTRAMUSCULAR | Status: DC
  Administered 2014-06-10 – 2014-06-11 (×3): 60 mg via INTRAVENOUS
  Filled 2014-06-10 (×3): qty 2

## 2014-06-10 MED ORDER — DEXTROSE 5 % IV SOLN
500.0000 mg | INTRAVENOUS | Status: DC
  Administered 2014-06-10 – 2014-06-12 (×3): 500 mg via INTRAVENOUS
  Filled 2014-06-10 (×3): qty 500

## 2014-06-10 MED ORDER — ALBUTEROL SULFATE (2.5 MG/3ML) 0.083% IN NEBU
2.5000 mg | INHALATION_SOLUTION | Freq: Once | RESPIRATORY_TRACT | Status: AC
  Administered 2014-06-10: 2.5 mg via RESPIRATORY_TRACT
  Filled 2014-06-10: qty 3

## 2014-06-10 MED ORDER — SODIUM CHLORIDE 0.9 % IJ SOLN
3.0000 mL | Freq: Two times a day (BID) | INTRAMUSCULAR | Status: DC
  Administered 2014-06-11 – 2014-06-13 (×4): 3 mL via INTRAVENOUS

## 2014-06-10 MED ORDER — NITROGLYCERIN 0.4 MG SL SUBL: 0.4000 mg | SUBLINGUAL_TABLET | SUBLINGUAL | Status: DC | PRN

## 2014-06-10 MED ORDER — VITAMIN D3 25 MCG (1000 UNIT) PO TABS
2000.0000 [IU] | ORAL_TABLET | Freq: Every day | ORAL | Status: DC
  Administered 2014-06-10 – 2014-06-12 (×3): 2000 [IU] via ORAL
  Filled 2014-06-10 (×5): qty 2

## 2014-06-10 MED ORDER — ALBUTEROL SULFATE (2.5 MG/3ML) 0.083% IN NEBU
2.5000 mg | INHALATION_SOLUTION | RESPIRATORY_TRACT | Status: DC
  Administered 2014-06-10: 2.5 mg via RESPIRATORY_TRACT
  Filled 2014-06-10: qty 3

## 2014-06-10 MED ORDER — PANTOPRAZOLE SODIUM 40 MG PO TBEC
40.0000 mg | DELAYED_RELEASE_TABLET | Freq: Two times a day (BID) | ORAL | Status: DC
  Administered 2014-06-10 – 2014-06-13 (×6): 40 mg via ORAL
  Filled 2014-06-10 (×7): qty 1

## 2014-06-10 MED ORDER — ACETAMINOPHEN 325 MG PO TABS: 650.0000 mg | ORAL_TABLET | Freq: Four times a day (QID) | ORAL | Status: DC | PRN

## 2014-06-10 MED ORDER — ONDANSETRON HCL 4 MG PO TABS: 4.0000 mg | ORAL_TABLET | Freq: Four times a day (QID) | ORAL | Status: DC | PRN

## 2014-06-10 MED ORDER — MIRTAZAPINE 30 MG PO TABS
30.0000 mg | ORAL_TABLET | Freq: Every day | ORAL | Status: DC
  Administered 2014-06-10 – 2014-06-12 (×3): 30 mg via ORAL
  Filled 2014-06-10 (×2): qty 1
  Filled 2014-06-10 (×2): qty 2

## 2014-06-10 MED ORDER — ENSURE COMPLETE PO LIQD
237.0000 mL | Freq: Two times a day (BID) | ORAL | Status: DC
  Administered 2014-06-12: 237 mL via ORAL

## 2014-06-10 MED ORDER — FERROUS SULFATE 325 (65 FE) MG PO TABS
325.0000 mg | ORAL_TABLET | Freq: Three times a day (TID) | ORAL | Status: DC
  Administered 2014-06-10 – 2014-06-13 (×7): 325 mg via ORAL
  Filled 2014-06-10 (×10): qty 1

## 2014-06-10 MED ORDER — DOCUSATE SODIUM 100 MG PO CAPS
100.0000 mg | ORAL_CAPSULE | Freq: Two times a day (BID) | ORAL | Status: DC
  Administered 2014-06-10 – 2014-06-13 (×6): 100 mg via ORAL
  Filled 2014-06-10 (×7): qty 1

## 2014-06-10 MED ORDER — ONDANSETRON HCL 4 MG/2ML IJ SOLN: 4.0000 mg | Freq: Four times a day (QID) | INTRAMUSCULAR | Status: DC | PRN

## 2014-06-10 MED ORDER — BUDESONIDE-FORMOTEROL FUMARATE 160-4.5 MCG/ACT IN AERO
2.0000 | INHALATION_SPRAY | Freq: Two times a day (BID) | RESPIRATORY_TRACT | Status: DC
  Administered 2014-06-10 – 2014-06-13 (×6): 2 via RESPIRATORY_TRACT
  Filled 2014-06-10: qty 6

## 2014-06-10 MED ORDER — ACETAMINOPHEN 650 MG RE SUPP: 650.0000 mg | Freq: Four times a day (QID) | RECTAL | Status: DC | PRN

## 2014-06-10 MED ORDER — ENOXAPARIN SODIUM 30 MG/0.3ML ~~LOC~~ SOLN
30.0000 mg | SUBCUTANEOUS | Status: DC
  Administered 2014-06-10 – 2014-06-12 (×3): 30 mg via SUBCUTANEOUS
  Filled 2014-06-10 (×4): qty 0.3

## 2014-06-10 MED ORDER — CHLORHEXIDINE GLUCONATE CLOTH 2 % EX PADS
6.0000 | MEDICATED_PAD | Freq: Every day | CUTANEOUS | Status: DC
  Administered 2014-06-11 – 2014-06-13 (×3): 6 via TOPICAL

## 2014-06-10 MED ORDER — GI COCKTAIL ~~LOC~~
30.0000 mL | ORAL | Status: DC | PRN
  Administered 2014-06-11: 30 mL via ORAL
  Filled 2014-06-10 (×2): qty 30

## 2014-06-10 MED ORDER — MUPIROCIN 2 % EX OINT
1.0000 "application " | TOPICAL_OINTMENT | Freq: Two times a day (BID) | CUTANEOUS | Status: DC
  Administered 2014-06-11 – 2014-06-13 (×6): 1 via NASAL
  Filled 2014-06-10 (×3): qty 22

## 2014-06-10 NOTE — ED Notes (Signed)
Kim RN

## 2014-06-10 NOTE — ED Notes (Signed)
Per pt comes from home for shob. Pt has had a productive cough for year and taking antibiotics. Pt was 80% O2 and placed on NRB after having 125mg  solumedrol and neb treatment 10mg  albuterol 0.5mg  atrovent, pt was still low 90's. EMS pts was tachypneic.  20g in left AC.

## 2014-06-10 NOTE — ED Notes (Signed)
RT at bedside.

## 2014-06-10 NOTE — ED Notes (Signed)
Bed: WA22 Expected date:  Expected time:  Means of arrival:  Comments: EMS 

## 2014-06-10 NOTE — ED Notes (Signed)
Daughter Rosey Batheresa [phone] home,  [phone] cell.  She requests she be updated if there's a change in his condition.  [place] and expressed concern about getting to the hospital.

## 2014-06-10 NOTE — ED Notes (Signed)
Advised daughter Aggie Cosierheresa of admission to rm [id number]

## 2014-06-10 NOTE — ED Provider Notes (Signed)
CSN: [id number]     Arrival date & time 06/10/14  1120 History   First MD Initiated Contact with Patient 06/10/14 1130     Chief Complaint  Patient presents with  . Shortness of Breath      HPI Per pt comes from home for shob. Pt has had a productive cough for year and taking antibiotics. Pt was 80% O2 and placed on NRB after having 125mg  solumedrol and neb treatment 10mg  albuterol 0.5mg  atrovent, pt was still low 90's. EMS pts was tachypneic.   Past Medical History  Diagnosis Date  . COPD (chronic obstructive pulmonary disease)   . CAD (coronary artery disease)     a.  nonobstructive CAD by cath 1999. b. Myoview 04/2012: fixed inferior and inferoseptal bowel attenuation artifact, no reversible ischemia. EF 62%. Low risk scan.  Marland Kitchen. HTN (hypertension)   . PUD (peptic ulcer disease)     a. s/p surgery 1976.  Marland Kitchen. Syncope     2012  . Carotid artery disease 03/22/13    a. 50-69% BICA by duplex 03/2013, repeat needed 11/2013.  Marland Kitchen. PAD (peripheral artery disease)     a. prior stenting of RCIA 1999. b. history of 90% vertebral artery narrowing, 40% subclavian artery narrowing. c. Diagnostic PV angio 08/2013 for progressive claudication - will ultimately need endarterectomy/patch angio on bilat CFA stenosis; will also need atherectomy/PT/stenting to REIA.   . CKD (chronic kidney disease), stage III   . HLD (hyperlipidemia)   . Chronic respiratory failure     a. On home O2.  Marland Kitchen. NSVT (nonsustained ventricular tachycardia)     a. Brief NSVT (6b) during 08/2013 adm.  . Stroke   . Shortness of breath   . On supplemental oxygen therapy     2l   Past Surgical History  Procedure Laterality Date  . Cataract extraction    . Gastrectomy    . Transurethral resection of prostate    . Coronary angioplasty with stent placement      stent in 2003; history of right common iliac artery stent hx  . Endarterectomy femoral Bilateral 11/11/2013    Procedure: BILATERAL FEMORAL ENDARTERECTOMIES WITH BILATERAL PATCH  ANGIOPLASTIES;  Surgeon: Nada LibmanVance W Brabham, MD;  [facility];  Service: Vascular;  Laterality: Bilateral;   Family History  Problem Relation Age of Onset  . Hypertension    . Coronary artery disease    . Stroke Mother   . Heart disease Mother   . Hypertension Mother   . Heart attack Mother   . Heart attack Brother   . Heart disease Brother   . Heart disease Father   . Hypertension Father   . Heart attack Father    History  Substance Use Topics  . Smoking status: Former Smoker -- 1.00 packs/day for 60 years    Types: Cigarettes    Quit date: 01/06/1997  . Smokeless tobacco: Never Used  . Alcohol Use: No     Comment: hx etoh abuse in past, daughter states quit over 20 years ago    Review of Systems  Unable to perform ROS: Acuity of condition      Allergies  Review of patient's allergies indicates no known allergies.  Home Medications   Prior to Admission medications   Medication Sig Start Date End Date Taking? Authorizing Provider  aspirin EC 81 MG tablet Take 81 mg by mouth at bedtime.   Yes Historical Provider, MD  budesonide-formoterol (SYMBICORT) 160-4.5 MCG/ACT inhaler Inhale 2 puffs into the lungs  2 (two) times daily.    Yes Historical Provider, MD  Cholecalciferol (VITAMIN D) 2000 UNITS tablet Take 2,000 Units by mouth at bedtime.    Yes Historical Provider, MD  metoprolol tartrate (LOPRESSOR) 25 MG tablet Take 25 mg by mouth 2 (two) times daily.    Yes Historical Provider, MD  mirtazapine (REMERON) 30 MG tablet Take 30 mg by mouth at bedtime.   Yes Historical Provider, MD  OXYGEN-HELIUM IN Inhale into the lungs as needed. 2 liters   Yes Historical Provider, MD  pantoprazole (PROTONIX) 40 MG tablet Take 40 mg by mouth 2 (two) times daily.   Yes Historical Provider, MD  pravastatin (PRAVACHOL) 40 MG tablet Take 40 mg by mouth at bedtime.    Yes Historical Provider, MD  temazepam (RESTORIL) 30 MG capsule Take 30 mg by mouth at bedtime.    Yes Historical Provider,  MD  ferrous sulfate 325 (65 FE) MG EC tablet Take 325 mg by mouth 3 (three) times daily with meals.    Historical Provider, MD   BP 126/70  Pulse 75  Temp(Src) 98.5 F (36.9 C) (Oral)  Resp 20  Ht 5\' 7"  (1.702 m)  Wt 118 lb 6.2 oz (53.7 kg)  BMI 18.54 kg/m2  SpO2 97% Physical Exam  Nursing note and vitals reviewed. Constitutional: He is oriented to person, place, and time. He appears well-developed and well-nourished. He appears distressed.  HENT:  Head: Normocephalic and atraumatic.  Eyes: Pupils are equal, round, and reactive to light.  Neck: Normal range of motion.  Cardiovascular: Normal rate and intact distal pulses.   Pulmonary/Chest: He is in respiratory distress. He has wheezes.  Abdominal: Normal appearance. He exhibits no distension. There is no tenderness. There is no rebound.  Musculoskeletal: Normal range of motion.  Neurological: He is alert and oriented to person, place, and time. No cranial nerve deficit.  Skin: Skin is warm and dry. No rash noted.  Psychiatric: He has a normal mood and affect. His behavior is normal.    ED Course  Procedures (including critical care time)  Medications  pantoprazole (PROTONIX) EC tablet 40 mg (40 mg Oral Given 06/12/14 1109)  ferrous sulfate tablet 325 mg (325 mg Oral Given 06/12/14 1710)  metoprolol tartrate (LOPRESSOR) tablet 25 mg (25 mg Oral Given 06/12/14 1109)  mirtazapine (REMERON) tablet 30 mg (30 mg Oral Given 06/11/14 2126)  aspirin EC tablet 81 mg (81 mg Oral Given 06/11/14 2127)  cholecalciferol (VITAMIN D) tablet 2,000 Units (2,000 Units Oral Given 06/11/14 2126)  pravastatin (PRAVACHOL) tablet 40 mg (40 mg Oral Given 06/11/14 2127)  temazepam (RESTORIL) capsule 30 mg (30 mg Oral Given 06/11/14 2125)  enoxaparin (LOVENOX) injection 30 mg (30 mg Subcutaneous Given 06/12/14 1710)  sodium chloride 0.9 % injection 3 mL (3 mLs Intravenous Given 06/12/14 1110)  budesonide-formoterol (SYMBICORT) 160-4.5 MCG/ACT inhaler  2 puff (2 puffs Inhalation Given 06/12/14 1942)  acetaminophen (TYLENOL) tablet 650 mg (not administered)    Or  acetaminophen (TYLENOL) suppository 650 mg (not administered)  docusate sodium (COLACE) capsule 100 mg (100 mg Oral Given 06/12/14 1109)  senna (SENOKOT) tablet 8.6 mg (8.6 mg Oral Given 06/12/14 1109)  ondansetron (ZOFRAN) tablet 4 mg (not administered)    Or  ondansetron (ZOFRAN) injection 4 mg (not administered)  guaiFENesin-codeine 100-10 MG/5ML solution 5 mL (not administered)  cefTRIAXone (ROCEPHIN) 1 g in dextrose 5 % 50 mL IVPB (1 g Intravenous Given 06/12/14 1525)  azithromycin (ZITHROMAX) 500 mg in dextrose 5 % 250  mL IVPB (500 mg Intravenous Given 06/12/14 1711)  nitroGLYCERIN (NITROSTAT) SL tablet 0.4 mg (not administered)  morphine 2 MG/ML injection 1-2 mg (not administered)  mupirocin ointment (BACTROBAN) 2 % 1 application (1 application Nasal Given 06/12/14 1110)  Chlorhexidine Gluconate Cloth 2 % PADS 6 each (6 each Topical Given 06/12/14 1240)  gi cocktail (Maalox,Lidocaine,Donnatal) (not administered)  predniSONE (DELTASONE) tablet 60 mg (60 mg Oral Given 06/12/14 0826)  lactose free nutrition (BOOST PLUS) liquid 237 mL (237 mLs Oral Given 06/12/14 1700)  albuterol (PROVENTIL) (2.5 MG/3ML) 0.083% nebulizer solution 2.5 mg (not administered)  ipratropium-albuterol (DUONEB) 0.5-2.5 (3) MG/3ML nebulizer solution 3 mL (not administered)  ipratropium-albuterol (DUONEB) 0.5-2.5 (3) MG/3ML nebulizer solution 3 mL (3 mLs Nebulization Given 06/10/14 1227)  albuterol (PROVENTIL) (2.5 MG/3ML) 0.083% nebulizer solution 2.5 mg (2.5 mg Nebulization Given 06/10/14 1254)  cefTRIAXone (ROCEPHIN) 1 g in dextrose 5 % 50 mL IVPB (1 g Intravenous New Bag/Given 06/10/14 1546)    CRITICAL CARE Performed by: Nelva Nay L Total critical care time: 30 min Critical care time was exclusive of separately billable procedures and treating other patients. Critical care was necessary to  treat or prevent imminent or life-threatening deterioration. Critical care was time spent personally by me on the following activities: development of treatment plan with patient and/or surrogate as well as nursing, discussions with consultants, evaluation of patient's response to treatment, examination of patient, obtaining history from patient or surrogate, ordering and performing treatments and interventions, ordering and review of laboratory studies, ordering and review of radiographic studies, pulse oximetry and re-evaluation of patient's condition.  Labs Review Labs Reviewed  MRSA PCR SCREENING - Abnormal; Notable for the following:    MRSA by PCR POSITIVE (*)    All other components within normal limits  CBC - Abnormal; Notable for the following:    RBC 4.12 (*)    Hemoglobin 12.8 (*)    All other components within normal limits  BLOOD GAS, ARTERIAL - Abnormal; Notable for the following:    pCO2 arterial 33.3 (*)    pO2, Arterial 69.5 (*)    Acid-base deficit 3.2 (*)    All other components within normal limits  BASIC METABOLIC PANEL - Abnormal; Notable for the following:    Glucose, Bld 169 (*)    Creatinine, Ser 1.38 (*)    GFR calc non Af Amer 45 (*)    GFR calc Af Amer 53 (*)    All other components within normal limits  BASIC METABOLIC PANEL - Abnormal; Notable for the following:    Glucose, Bld 140 (*)    BUN 26 (*)    GFR calc non Af Amer 50 (*)    GFR calc Af Amer 58 (*)    All other components within normal limits  CBC - Abnormal; Notable for the following:    WBC 11.8 (*)    RBC 3.45 (*)    Hemoglobin 11.1 (*)    HCT 33.8 (*)    Platelets 146 (*)    All other components within normal limits  HEPATIC FUNCTION PANEL - Abnormal; Notable for the following:    Albumin 3.0 (*)    Total Bilirubin <0.2 (*)    All other components within normal limits  CULTURE, BLOOD (ROUTINE X 2)  CULTURE, BLOOD (ROUTINE X 2)  RESPIRATORY VIRUS PANEL  CULTURE, EXPECTORATED  SPUTUM-ASSESSMENT  CULTURE, RESPIRATORY (NON-EXPECTORATED)  PRO B NATRIURETIC PEPTIDE  TROPONIN I  TROPONIN I  TROPONIN I  HEMOGLOBIN A1C  HIV ANTIBODY (ROUTINE  TESTING)  LEGIONELLA ANTIGEN, URINE  STREP PNEUMONIAE URINARY ANTIGEN  LIPASE, BLOOD  BASIC METABOLIC PANEL  CBC  I-STAT TROPOININ, ED    Imaging Review Dg Chest Port 1 View  06/12/2014   CLINICAL DATA:  Followup pneumonia, continued shortness of breath  EXAM: PORTABLE CHEST - 1 VIEW  COMPARISON:  06/10/2014  FINDINGS: The heart size and vascular pattern are normal. The aortic arch is calcified.  There is severe interstitial change diffusely bilaterally. More focal infiltrate is seen laterally in the lingula and in the right lower lobe. Lingular infiltrate is not significantly different from prior study, while there has been moderate improvement in right lower lobe infiltrate. No significant pleural effusions.  IMPRESSION: Continued diffuse interstitial change, with stable infiltrate in the lingula and improved infiltrate in the right lower lobe.   Electronically Signed   By: Esperanza Heir M.D.   On: 06/12/2014 08:10     EKG Interpretation None      MDM   Final diagnoses:  Community acquired pneumonia        Nelia Shi, MD 06/12/14 [phone]

## 2014-06-10 NOTE — H&P (Addendum)
[facility] History and Physical  Cheryle HorsfallJohnie E Acuna ZOX:[id number] DOB: [birth year] DOA: 06/10/2014  Referring physician:  Nelva Nayobert Beaton PCP:  Eartha InchBADGER,MICHAEL C, MD   Chief Complaint:  SOB  HPI:  The patient is a 80 y.o. year-old male with history of COPD on 3L [place] with exertion, CAD, NSVT, PVD, CVA, CKD stage 3, PUD s/p partial gastrectomy who presents with SOB.  The patient was last at their baseline health two days ago.  Yesterday, he felt fatigued and more Graciella Arment of breath.  He had worsening cough with increased clear-white sputum.  He denies increased rhinorrhea or sinus congestion or sore throat.  This morning, he felt feverish and was very Loreli Debruler of breath.  He thought he was going to die even after applying his oxygen at 3L.  He also has had sharp left chest pain with radiation to back which lasted about 1 minute associated with SOB, nausea, and lightheadedness which occurred at rest and has recurred two more times since this morning.  EMS arrived and he was 80% and placed on NRB, given solumedrol and nebulizer treatment.  Despite NRB, oxygen remained in low 90s.    In the ER, He was tachycardic to 130s, O2 sat 90s on 5L [place] after several duonebs.  ABG likely drawn on NRB:  7.4/33/69.  CXR demonstrates multifocal pneumonia with dense RLL pneumonia and interstitial thickening.  No recent admissions to hospital so blood cultures were obtained and he was started on ceftriaxone and azithromycin and is being admitted to stepdown.    Review of Systems:  General:  + fevers, chills this morning.   HEENT:  Denies changes to hearing and vision, rhinorrhea, sinus congestion, sore throat CV:   Denies palpitations, lower extremity edema.  PULM:  Per HPI   GI:  Denies nausea, vomiting, constipation, diarrhea.   GU:  Denies dysuria, frequency, urgency ENDO:  Denies polyuria, polydipsia.   HEME:  Denies hematemesis, blood in stools, melena, abnormal bruising or bleeding.  LYMPH:  Denies  lymphadenopathy.   MSK:  Denies arthralgias, myalgias.   DERM:  Denies skin rash or ulcer.   NEURO:  Denies focal numbness, weakness, slurred speech, confusion, facial droop.  PSYCH:  Denies anxiety and depression.    Past Medical History  Diagnosis Date  . COPD (chronic obstructive pulmonary disease)   . CAD (coronary artery disease)     a.  nonobstructive CAD by cath 1999. b. Myoview 04/2012: fixed inferior and inferoseptal bowel attenuation artifact, no reversible ischemia. EF 62%. Low risk scan.  Marland Kitchen. HTN (hypertension)   . PUD (peptic ulcer disease)     a. s/p surgery 1976.  Marland Kitchen. Syncope     2012  . Carotid artery disease 03/22/13    a. 50-69% BICA by duplex 03/2013, repeat needed 11/2013.  Marland Kitchen. PAD (peripheral artery disease)     a. prior stenting of RCIA 1999. b. history of 90% vertebral artery narrowing, 40% subclavian artery narrowing. c. Diagnostic PV angio 08/2013 for progressive claudication - will ultimately need endarterectomy/patch angio on bilat CFA stenosis; will also need atherectomy/PT/stenting to REIA.   . CKD (chronic kidney disease), stage III   . HLD (hyperlipidemia)   . Chronic respiratory failure     a. On home O2.  Marland Kitchen. NSVT (nonsustained ventricular tachycardia)     a. Brief NSVT (6b) during 08/2013 adm.  . Stroke   . Shortness of breath   . On supplemental oxygen therapy     2l   Past Surgical History  Procedure Laterality Date  . Cataract extraction    . Gastrectomy    . Transurethral resection of prostate    . Coronary angioplasty with stent placement      stent in 2003; history of right common iliac artery stent hx  . Endarterectomy femoral Bilateral 11/11/2013    Procedure: BILATERAL FEMORAL ENDARTERECTOMIES WITH BILATERAL PATCH ANGIOPLASTIES;  Surgeon: Nada Libman, MD;  Location: [facility] OR;  Service: Vascular;  Laterality: Bilateral;   Social History:  reports that he quit smoking about 17 years ago. His smoking use included Cigarettes. He has a 60 pack-year  smoking history. He has never used smokeless tobacco. He reports that he does not drink alcohol or use illicit drugs. Lives with his wife who is disabled.  He does not use any assist device.  He mows grass, cooks, etc and cares for her.    No Known Allergies  Family History  Problem Relation Age of Onset  . Hypertension    . Coronary artery disease    . Stroke Mother   . Heart disease Mother   . Hypertension Mother   . Heart attack Mother   . Heart attack Brother   . Heart disease Brother   . Heart disease Father   . Hypertension Father   . Heart attack Father      Prior to Admission medications   Medication Sig Start Date End Date Taking? Authorizing Provider  aspirin EC 81 MG tablet Take 81 mg by mouth at bedtime.   Yes Historical Provider, MD  budesonide-formoterol (SYMBICORT) 160-4.5 MCG/ACT inhaler Inhale 2 puffs into the lungs 2 (two) times daily.    Yes Historical Provider, MD  Cholecalciferol (VITAMIN D) 2000 UNITS tablet Take 2,000 Units by mouth at bedtime.    Yes Historical Provider, MD  metoprolol tartrate (LOPRESSOR) 25 MG tablet Take 25 mg by mouth 2 (two) times daily.    Yes Historical Provider, MD  mirtazapine (REMERON) 30 MG tablet Take 30 mg by mouth at bedtime.   Yes Historical Provider, MD  OXYGEN-HELIUM IN Inhale into the lungs as needed. 2 liters   Yes Historical Provider, MD  pantoprazole (PROTONIX) 40 MG tablet Take 40 mg by mouth 2 (two) times daily.   Yes Historical Provider, MD  pravastatin (PRAVACHOL) 40 MG tablet Take 40 mg by mouth at bedtime.    Yes Historical Provider, MD  temazepam (RESTORIL) 30 MG capsule Take 30 mg by mouth at bedtime.    Yes Historical Provider, MD  ferrous sulfate 325 (65 FE) MG EC tablet Take 325 mg by mouth 3 (three) times daily with meals.    Historical Provider, MD   Physical Exam: Filed Vitals:   06/10/14 1255 06/10/14 1300 06/10/14 1306 06/10/14 1408  BP:   98/59 105/66  Pulse:  118  120  Temp:      TempSrc:       Resp:  34    SpO2: 94% 95%  91%     General:  Cachectic WM, NAD on 5L [place]  Eyes:  PERRL, anicteric, non-injected.  ENT:  Nares clear.  OP clear, non-erythematous without plaques or exudates.  MMM.  Neck:  Supple without TM or JVD.    Lymph:  No cervical, supraclavicular, or submandibular LAD.  Cardiovascular:  RRR, normal S1, S2, without m/r/g.  2+ pulses, warm extremities  Respiratory:  diminished breath sounds at the right base with rales at bilateral bases, no wheeze or rhonchi at time of my exam and no increased  WOB.  Abdomen:  NABS.  Soft, ND/NT.    Skin:  No rashes or focal lesions.  Musculoskeletal:  Normal bulk and tone.  No LE edema.  Psychiatric:  A & O x 4.  Appropriate affect.  Neurologic:  CN 3-12 intact.  5/5 strength.  Sensation intact.  Labs on Admission:  Basic Metabolic Panel:  Recent Labs Lab 06/10/14 1155  NA 144  K 4.5  CL 108  CO2 23  GLUCOSE 169*  BUN 22  CREATININE 1.38*  CALCIUM 9.1   Liver Function Tests: No results found for this basename: AST, ALT, ALKPHOS, BILITOT, PROT, ALBUMIN,  in the last 168 hours No results found for this basename: LIPASE, AMYLASE,  in the last 168 hours No results found for this basename: AMMONIA,  in the last 168 hours CBC:  Recent Labs Lab 06/10/14 1155  WBC 8.1  HGB 12.8*  HCT 40.8  MCV 99.0  PLT 181   Cardiac Enzymes: No results found for this basename: CKTOTAL, CKMB, CKMBINDEX, TROPONINI,  in the last 168 hours  BNP (last 3 results)  Recent Labs  06/10/14 1156  PROBNP 398.9   CBG: No results found for this basename: GLUCAP,  in the last 168 hours  Radiological Exams on Admission: Dg Chest Port 1 View  06/10/2014   CLINICAL DATA:  Patient presents to the Emergency Department from home with shortness of breath. Patient has had a productive cough for a year. Patient has been taking antibiotics. Office is seen saturation is decreased 80%. Patient is tachypneic.  EXAM: PORTABLE CHEST - 1  VIEW  COMPARISON:  11/07/2013  FINDINGS: Since the prior exam, patchy areas of airspace consolidation have developed in the mid lungs and right lower lung, most confluent in the right lung base. Lungs are hyperexpanded. Mild underlying interstitial thickening is noted.  Cardiac silhouette is normal in size. Aorta is mildly uncoiled. No mediastinal or hilar masses. No pleural effusion or pneumothorax.  Bony thorax is demineralized but grossly intact.  IMPRESSION: 1. Patchy areas of airspace lung consolidation, most confluent in the right lower lung, consistent with multifocal pneumonia.   Electronically Signed   By: Amie Portland M.D.   On: 06/10/2014 12:24    EKG:  Pending.  Appears to have sinus rhythm with frequent PVCs on telemetry  Assessment/Plan Active Problems:   Community acquired pneumonia  ---  Acute on chronic hypoxic respiratory failure due to CAP and acute COPD exacerbation.  Given CXR findings and acute onset of symptoms, I am worried he may get worse before he gets better.  He is amenable to bipap and/or intubation if needed.   -  Droplet precautions -  Respiratory viral panel -  Blood cultures -  Sputum culture -  HIV -  Legionella and S. pneumo ag -  Ceftriaxone and azithromycin  -  Bipap if needed  -  Wean oxygen as tolerated -  Start solumedrol 60mg  IV BID -  Duonebs q4h with albuterol q2h prn -  Cough suppressants as needed -  Continue symbicort  CAD and PVD with episodic chest pain this morning.  Chest pain may also be related to difficulty breathing secondary to pneumonia and COPD.   -  Continue ASA, BB, statin -  Telemetry -  Cycle troponins -  Liver function panel and lipase   PUD, stable, continue PPI Suspect moderate protein calorie malnutrition -  Nutrition consult -  Regular diet -  Supplements as needed  Diet:  Regular Access:  PIV IVF:  off Proph:  lovenox  Code Status: limited Family Communication: patient alone Disposition Plan: Admit to  stepdown  Time spent: 60 min Quanetta Truss [facility] Pager [phone]  If 7PM-7AM, please contact night-coverage [email or web address] Password [email or web address] 06/10/2014, 3:02 PM

## 2014-06-11 DIAGNOSIS — I517 Cardiomegaly: Secondary | ICD-10-CM

## 2014-06-11 LAB — BASIC METABOLIC PANEL
Anion gap: 14 (ref 5–15)
BUN: 26 mg/dL — ABNORMAL HIGH (ref 6–23)
CO2: 22 mEq/L (ref 19–32)
Calcium: 8.9 mg/dL (ref 8.4–10.5)
Chloride: 107 mEq/L (ref 96–112)
Creatinine, Ser: 1.28 mg/dL (ref 0.50–1.35)
GFR calc Af Amer: 58 mL/min — ABNORMAL LOW (ref >90)
GFR, EST NON AFRICAN AMERICAN: 50 mL/min — AB (ref >90)
GLUCOSE: 140 mg/dL — AB (ref 70–99)
POTASSIUM: 4.3 meq/L (ref 3.7–5.3)
SODIUM: 143 meq/L (ref 137–147)

## 2014-06-11 LAB — CBC
HEMATOCRIT: 33.8 % — AB (ref 39.0–52.0)
Hemoglobin: 11.1 g/dL — ABNORMAL LOW (ref 13.0–17.0)
MCH: 32.2 pg (ref 26.0–34.0)
MCHC: 32.8 g/dL (ref 30.0–36.0)
MCV: 98 fL (ref 78.0–100.0)
PLATELETS: 146 10*3/uL — AB (ref 150–400)
RBC: 3.45 MIL/uL — ABNORMAL LOW (ref 4.22–5.81)
RDW: 14.7 % (ref 11.5–15.5)
WBC: 11.8 10*3/uL — AB (ref 4.0–10.5)

## 2014-06-11 LAB — TROPONIN I
Troponin I: <0.3 ng/mL (ref <0.30)
Troponin I: <0.3 ng/mL (ref <0.30)

## 2014-06-11 LAB — LEGIONELLA ANTIGEN, URINE

## 2014-06-11 NOTE — Progress Notes (Signed)
  Echocardiogram 2D Echocardiogram has been performed.  Sheralyn BoatmanWest, Shelisa Fern R 06/11/2014, 1:20 PM

## 2014-06-11 NOTE — Progress Notes (Signed)
RN paged about chest tightness/ SOB.  Pt on ASA and troponin negative X2.  Ordered Nitro and PRN morphine, and PRN GI cocktail. EKG- sinus tachy with PAC, ST depression- not markedly different from previous EKG. Will continue to follow.  VSS, O2 stas 96% on 3 L [place]  Update: RN reported resolved chest tightness. Pt didn't need PRN morphine, GI cocktail.  Illa LevelSahar Osman [facility]

## 2014-06-11 NOTE — Evaluation (Signed)
Physical Therapy Evaluation Patient Details Name: Alec HorsfallJohnie E Dore MRN: [id number] DOB: 1927-02-21 Today's Date: 06/11/2014   History of Present Illness  80 yo male admitted with Pna, COPD exac. Hx of COPD, O2 with exertion, CAD, CVA, PVD. NSVT. 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 O2 sats 90% on RA, dyspnea 3/4 with ambulation. Pt c/o "muscle cramping" during supine<>sit in chest area. Replaced [place] O2 at end of session to aid in recovery. Tolerated activity fairly well. Will follow.    Follow Up Recommendations Home health PT;No PT follow up (depending on progress)    Equipment Recommendations  None recommended by PT    Recommendations for Other Services       Precautions / Restrictions Precautions Precautions: Fall Precaution Comments: O2. Monitor vitals Restrictions Weight Bearing Restrictions: No      Mobility  Bed Mobility Overal bed mobility: Needs Assistance Bed Mobility: Supine to Sit;Sit to Supine     Supine to sit: Supervision Sit to supine: Supervision   General bed mobility comments: Pt c/o "muscle cramping" when changing position in bed (supine to sit, sit to supine)  Transfers Overall transfer level: Needs assistance   Transfers: Sit to/from Stand Sit to Stand: Min guard         General transfer comment: close guard for safety  Ambulation/Gait Ambulation/Gait assistance: Min guard Ambulation Distance (Feet): 150 Feet Assistive device: None Gait Pattern/deviations: Step-through pattern;Decreased stride length     General Gait Details: close guard for safety. O2 sats 90% on RA. Dyspnea 3/4 with activity.   Stairs            Wheelchair Mobility    Modified Rankin (Stroke Patients Only)       Balance                                             Pertinent Vitals/Pain Pain Assessment: Faces Faces Pain Scale: Hurts  little more Pain Location: 0/10 at rest; c/o muscle cramping (chest area)with activity Pain Descriptors / Indicators: Cramping Pain Intervention(s): Limited activity within patient's tolerance;Monitored during session;Relaxation    Home Living Family/patient expects to be discharged to:: Private residence Living Arrangements: Spouse/significant other   Type of Home: House Home Access: Stairs to enter Entrance Stairs-Rails: Right Entrance Stairs-Number of Steps: 7 Home Layout: One level Home Equipment: Environmental consultantWalker - 2 wheels      Prior Function Level of Independence: Independent         Comments: 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     Hand Dominance        Extremity/Trunk Assessment   Upper Extremity Assessment: Overall WFL for tasks assessed           Lower Extremity Assessment: Overall WFL for tasks assessed      Cervical / Trunk Assessment: Kyphotic  Communication   Communication: HOH  Cognition Arousal/Alertness: Awake/alert Behavior During Therapy: WFL for tasks assessed/performed Overall Cognitive Status: Within Functional Limits for tasks assessed                      General Comments      Exercises        Assessment/Plan    PT Assessment Patient needs continued PT services  PT Diagnosis Generalized weakness;Difficulty walking   PT  Problem List Decreased activity tolerance;Decreased balance;Decreased mobility;Decreased strength  PT Treatment Interventions DME instruction;Gait training;Functional mobility training;Therapeutic activities;Therapeutic exercise;Patient/family education;Balance training   PT Goals (Current goals can be found in the Care Plan section) Acute Rehab PT Goals Patient Stated Goal: to get better;breathe better PT Goal Formulation: With patient Time For Goal Achievement: 06/25/14 Potential to Achieve Goals: Good    Frequency Min 3X/week   Barriers to discharge        Co-evaluation               End of Session  Equipment Utilized During Treatment: Gait belt Activity Tolerance: Patient limited by fatigue Patient left: in bed;with call bell/phone within reach           Time: 1602-1616 PT Time Calculation (min): 14 min   Charges:   PT Evaluation $Initial PT Evaluation Tier I: 1 Procedure PT Treatments $Gait Training: 8-22 mins   PT G Codes:          Rebeca AlertJannie Teyon Odette, MPT Pager: [phone]

## 2014-06-11 NOTE — Progress Notes (Signed)
[facility] PROGRESS NOTE  Alec Walker ZOX:[id number] DOB: 08/13/1927 DOA: 06/10/2014 PCP: Eartha Inch, MD  Assessment/Plan  Acute on chronic hypoxic respiratory failure due to CAP and acute COPD exacerbation. Given CXR findings and acute onset of symptoms, I am worried he may get worse before he gets better. He is amenable to bipap and/or intubation if needed.  - Droplet precautions pending - Respiratory viral panel  - Blood cultures pending - Sputum culture pending - HIV NR - Legionella pending and S. pneumo ag neg - Continue Ceftriaxone and azithromycin day 1 - Wean oxygen as tolerated  - Continue solumedrol 60mg  IV BID  - Duonebs q4h with albuterol q2h prn  - Cough suppressants as needed  - Continue symbicort   Severe sepsis (hypotension to 76/38, tachypnea, tachycardia, PNA source) -  abx and cultures as above -  ECHO -  Cortisol level will be abnl due to patient already being on steroids, defer for now  Transient asymptomatic hypotension to 70s systolic (labile BPs)  CAD and PVD with episodic chest pain this morning. Chest pain may also be related to difficulty breathing secondary to pneumonia and COPD.  - Continue ASA, BB, statin  - Telemetry:  NSR with PVC - Troponins negative - Liver function panel and lipase wnl except for hypoalbuminemia -  NTG and morphine -  ECHO  PUD, stable, continue PPI   Suspect moderate protein calorie malnutrition  - Nutrition consult  - Regular diet  - Supplements as needed  Diet:  regular Access:  PIV IVF:  off Proph:  lovenox  Code Status: limited Family Communication: patient alone Disposition Plan:  Pending improvement in blood pressure, tachypnea improving   Consultants:  None  Procedures:  CXR  Antibiotics:  Ceftriaxone and azithromycin day 1   HPI/Subjective:  Feeling better.  Breathing improves with breathing treatments.  Coughing up green sputum.      Objective: Filed Vitals:   06/11/14 0314 06/11/14 0400 06/11/14 0500 06/11/14 0600  BP:  109/55 98/41 170/66  Pulse:  86 85 79  Temp:  98.3 F (36.8 C)    TempSrc:  Oral    Resp:  29 26 29   Height:      Weight:      SpO2: 91% 98% 96% 98%    Intake/Output Summary (Last 24 hours) at 06/11/14 0734 Last data filed at 06/11/14 0400  Gross per 24 hour  Intake    250 ml  Output      0 ml  Net    250 ml   Filed Weights   06/10/14 1703  Weight: 51.9 kg (114 lb 6.7 oz)    Exam:   General:  Cachectic WM, No acute distress  HEENT:  NCAT, MMM  Cardiovascular:  RRR, nl S1, S2 no mrg, 2+ pulses, warm extremities  Respiratory:  Rales throughout, no obvious wheeze or rhonchi anteriorly today, no increased WOB  Abdomen:   NABS, soft, NT/ND  MSK:   Normal tone and bulk, no LEE  Neuro:  Grossly intact  Data Reviewed: Basic Metabolic Panel:  Recent Labs Lab 06/10/14 1155  NA 144  K 4.5  CL 108  CO2 23  GLUCOSE 169*  BUN 22  CREATININE 1.38*  CALCIUM 9.1   Liver Function Tests:  Recent Labs Lab 06/10/14 1805  AST 17  ALT 11  ALKPHOS 50  BILITOT <0.2*  PROT 6.0  ALBUMIN 3.0*    Recent Labs Lab 06/10/14 1805  LIPASE 12   No results  found for this basename: AMMONIA,  in the last 168 hours CBC:  Recent Labs Lab 06/10/14 1155  WBC 8.1  HGB 12.8*  HCT 40.8  MCV 99.0  PLT 181   Cardiac Enzymes:  Recent Labs Lab 06/10/14 1805 06/11/14 0110  TROPONINI <0.30 <0.30   BNP (last 3 results)  Recent Labs  06/10/14 1156  PROBNP 398.9   CBG: No results found for this basename: GLUCAP,  in the last 168 hours  Recent Results (from the past 240 hour(s))  MRSA PCR SCREENING     Status: Abnormal   Collection Time    06/10/14  5:51 PM      Result Value Ref Range Status   MRSA by PCR POSITIVE (*) NEGATIVE Final   Comment:            The GeneXpert MRSA Assay (FDA     approved for NASAL specimens     only), is one component of a     comprehensive MRSA colonization      surveillance program. It is not     intended to diagnose MRSA     infection nor to guide or     monitor treatment for     MRSA infections.     RESULT CALLED TO, READ BACK BY AND VERIFIED WITH:     C.DINNY,RN AT 2258 ON 06/10/14 BY [email or web address]  CULTURE, EXPECTORATED SPUTUM-ASSESSMENT     Status: None   Collection Time    06/10/14 10:47 PM      Result Value Ref Range Status   Specimen Description SPUTUM   Final   Special Requests NONE   Final   Sputum evaluation     Final   Value: THIS SPECIMEN IS ACCEPTABLE. RESPIRATORY CULTURE REPORT TO FOLLOW.   Report Status 06/10/2014 FINAL   Final     Studies: Dg Chest Port 1 View  06/10/2014   CLINICAL DATA:  Patient presents to the Emergency Department from home with shortness of breath. Patient has had a productive cough for a year. Patient has been taking antibiotics. Office is seen saturation is decreased 80%. Patient is tachypneic.  EXAM: PORTABLE CHEST - 1 VIEW  COMPARISON:  11/07/2013  FINDINGS: Since the prior exam, patchy areas of airspace consolidation have developed in the mid lungs and right lower lung, most confluent in the right lung base. Lungs are hyperexpanded. Mild underlying interstitial thickening is noted.  Cardiac silhouette is normal in size. Aorta is mildly uncoiled. No mediastinal or hilar masses. No pleural effusion or pneumothorax.  Bony thorax is demineralized but grossly intact.  IMPRESSION: 1. Patchy areas of airspace lung consolidation, most confluent in the right lower lung, consistent with multifocal pneumonia.   Electronically Signed   By: Amie Portlandavid  Ormond M.D.   On: 06/10/2014 12:24    Scheduled Meds: . aspirin EC  81 mg Oral QHS  . azithromycin  500 mg Intravenous Q24H  . budesonide-formoterol  2 puff Inhalation BID  . cefTRIAXone (ROCEPHIN)  IV  1 g Intravenous Q24H  . Chlorhexidine Gluconate Cloth  6 each Topical Q0600  . cholecalciferol  2,000 Units Oral QHS  . docusate sodium  100 mg Oral BID  . enoxaparin  (LOVENOX) injection  30 mg Subcutaneous Q24H  . feeding supplement (ENSURE COMPLETE)  237 mL Oral BID BM  . ferrous sulfate  325 mg Oral TID WC  . ipratropium-albuterol  3 mL Nebulization Q4H  . methylPREDNISolone (SOLU-MEDROL) injection  60 mg Intravenous BID  . metoprolol tartrate  25 mg Oral BID  . mirtazapine  30 mg Oral QHS  . mupirocin ointment  1 application Nasal BID  . pantoprazole  40 mg Oral BID  . pravastatin  40 mg Oral QHS  . senna  1 tablet Oral BID  . sodium chloride  3 mL Intravenous Q12H  . temazepam  30 mg Oral QHS   Continuous Infusions:   Active Problems:   Community acquired pneumonia   COPD exacerbation   Acute on chronic respiratory failure with hypoxia   Chest pain    Time spent: 30 min    Arline Ketter, [facility] CenterMACKENZIE  [facility] Pager [phone]. If 7PM-7AM, please contact night-coverage at [email or web address], password [email or web address] 06/11/2014, 7:34 AM  LOS: 1 day

## 2014-06-12 ENCOUNTER — Inpatient Hospital Stay (HOSPITAL_COMMUNITY): Payer: Medicare Other

## 2014-06-12 LAB — RESPIRATORY VIRUS PANEL
Adenovirus: NOT DETECTED
INFLUENZA A H3: NOT DETECTED
INFLUENZA B 1: NOT DETECTED
Influenza A H1: NOT DETECTED
Influenza A: NOT DETECTED
Metapneumovirus: NOT DETECTED
PARAINFLUENZA 1 A: NOT DETECTED
PARAINFLUENZA 2 A: NOT DETECTED
Parainfluenza 3: NOT DETECTED
RESPIRATORY SYNCYTIAL VIRUS A: NOT DETECTED
Respiratory Syncytial Virus B: NOT DETECTED
Rhinovirus: NOT DETECTED

## 2014-06-12 MED ORDER — BOOST PLUS PO LIQD
237.0000 mL | Freq: Three times a day (TID) | ORAL | Status: DC
  Administered 2014-06-12 – 2014-06-13 (×3): 237 mL via ORAL
  Filled 2014-06-12 (×5): qty 237

## 2014-06-12 MED ORDER — ALBUTEROL SULFATE (2.5 MG/3ML) 0.083% IN NEBU: 2.5000 mg | INHALATION_SOLUTION | Freq: Four times a day (QID) | RESPIRATORY_TRACT | Status: DC | PRN

## 2014-06-12 MED ORDER — IPRATROPIUM-ALBUTEROL 0.5-2.5 (3) MG/3ML IN SOLN
3.0000 mL | RESPIRATORY_TRACT | Status: DC
  Administered 2014-06-12 – 2014-06-13 (×2): 3 mL via RESPIRATORY_TRACT
  Filled 2014-06-12 (×2): qty 3

## 2014-06-12 MED ORDER — GI COCKTAIL ~~LOC~~
30.0000 mL | Freq: Four times a day (QID) | ORAL | Status: DC | PRN
  Filled 2014-06-12: qty 30

## 2014-06-12 MED ORDER — PREDNISONE 50 MG PO TABS
60.0000 mg | ORAL_TABLET | Freq: Every day | ORAL | Status: DC
  Administered 2014-06-12 – 2014-06-13 (×2): 60 mg via ORAL
  Filled 2014-06-12 (×2): qty 1
  Filled 2014-06-12: qty 3

## 2014-06-12 NOTE — Progress Notes (Addendum)
INITIAL NUTRITION ASSESSMENT  Patient meets criteria for mil/moderate malnutrition related to chronic illness AEB decreased muscle mass and body fat.  DOCUMENTATION CODES Per approved criteria  -Non-severe (moderate) malnutrition in the context of chronic illness   INTERVENTION: -Continue Regular diet -Change Ensure to Boost tid -Snack daily -Encouraged intake of meals, supplement and snack. -RD to follow  NUTRITION DIAGNOSIS: Inadequate oral intake related to decreased appetite as evidenced by patient report.   Goal: Intake of meals and supplements to meet >90% estimated needs.  Monitor:  Intake, labs, weight trend  Reason for Assessment: Consult for assessment of nutritional status and needs.  80 y.o. male  Admitting Dx: <principal problem not specified>  ASSESSMENT: Patient admitted with CAP/COPD.   Patient reports that his appetite has been poor since admit. Decreased intake. Eats 2 meals daily at home plus a snack. Prepares the meals as wife is "crippled".  Has tried Ensure in the past and dislikes.   Nutrition Focused Physical Exam:  Subcutaneous Fat:  Orbital Region: mild Upper Arm Region: moderate Thoracic and Lumbar Region: n/a   Muscle:  Temple Region: severe Clavicle Bone Region: severe Clavicle and Acromion Bone Region: moderate Scapular Bone Region: moderate Dorsal Hand: moderate Patellar Region: moderate Anterior Thigh Region: moderate Posterior Calf Region: moderate  Edema: not noted.      Height: Ht Readings from Last 1 Encounters:  06/10/14 5\' 7"  (1.702 m)    Weight: Wt Readings from Last 1 Encounters:  06/12/14 118 lb 6.2 oz (53.7 kg)    Ideal Body Weight: 148  % Ideal Body Weight: 80  Wt Readings from Last 10 Encounters:  06/12/14 118 lb 6.2 oz (53.7 kg)  03/27/14 119 lb 6.4 oz (54.159 kg)  11/28/13 124 lb 8 oz (56.473 kg)  11/11/13 121 lb 11.1 oz (55.2 kg)  11/11/13 121 lb 11.1 oz (55.2 kg)  11/07/13 121 lb 9.6 oz (55.157  kg)  10/31/13 122 lb 9.6 oz (55.611 kg)  10/10/13 117 lb 8 oz (53.298 kg)  09/27/13 119 lb (53.978 kg)  09/20/13 119 lb (53.978 kg)    Usual Body Weight: 119 lbs 2012  % Usual Body Weight: 99  BMI:  Body mass index is 18.54 kg/(m^2).  Estimated Nutritional Needs: Kcal: 1600-1800 Protein: 80-90 Fluid: >1.6L daily  Skin: intact  Diet Order: General  EDUCATION NEEDS: -Education needs addressed   Intake/Output Summary (Last 24 hours) at 06/12/14 1127 Last data filed at 06/12/14 0100  Gross per 24 hour  Intake      0 ml  Output    200 ml  Net   -200 ml     Labs:   Recent Labs Lab 06/10/14 1155 06/11/14 0714  NA 144 143  K 4.5 4.3  CL 108 107  CO2 23 22  BUN 22 26*  CREATININE 1.38* 1.28  CALCIUM 9.1 8.9  GLUCOSE 169* 140*    CBG (last 3)  No results found for this basename: GLUCAP,  in the last 72 hours  Scheduled Meds: . aspirin EC  81 mg Oral QHS  . azithromycin  500 mg Intravenous Q24H  . budesonide-formoterol  2 puff Inhalation BID  . cefTRIAXone (ROCEPHIN)  IV  1 g Intravenous Q24H  . Chlorhexidine Gluconate Cloth  6 each Topical Q0600  . cholecalciferol  2,000 Units Oral QHS  . docusate sodium  100 mg Oral BID  . enoxaparin (LOVENOX) injection  30 mg Subcutaneous Q24H  . ferrous sulfate  325 mg Oral TID WC  .  ipratropium-albuterol  3 mL Nebulization Q4H  . lactose free nutrition  237 mL Oral TID WC  . metoprolol tartrate  25 mg Oral BID  . mirtazapine  30 mg Oral QHS  . mupirocin ointment  1 application Nasal BID  . pantoprazole  40 mg Oral BID  . pravastatin  40 mg Oral QHS  . predniSONE  60 mg Oral Q breakfast  . senna  1 tablet Oral BID  . sodium chloride  3 mL Intravenous Q12H  . temazepam  30 mg Oral QHS    Continuous Infusions:   Past Medical History  Diagnosis Date  . COPD (chronic obstructive pulmonary disease)   . CAD (coronary artery disease)     a.  nonobstructive CAD by cath 1999. b. Myoview 04/2012: fixed inferior and  inferoseptal bowel attenuation artifact, no reversible ischemia. EF 62%. Low risk scan.  Marland Kitchen. HTN (hypertension)   . PUD (peptic ulcer disease)     a. s/p surgery 1976.  Marland Kitchen. Syncope     2012  . Carotid artery disease 03/22/13    a. 50-69% BICA by duplex 03/2013, repeat needed 11/2013.  Marland Kitchen. PAD (peripheral artery disease)     a. prior stenting of RCIA 1999. b. history of 90% vertebral artery narrowing, 40% subclavian artery narrowing. c. Diagnostic PV angio 08/2013 for progressive claudication - will ultimately need endarterectomy/patch angio on bilat CFA stenosis; will also need atherectomy/PT/stenting to REIA.   . CKD (chronic kidney disease), stage III   . HLD (hyperlipidemia)   . Chronic respiratory failure     a. On home O2.  Marland Kitchen. NSVT (nonsustained ventricular tachycardia)     a. Brief NSVT (6b) during 08/2013 adm.  . Stroke   . Shortness of breath   . On supplemental oxygen therapy     2l    Past Surgical History  Procedure Laterality Date  . Cataract extraction    . Gastrectomy    . Transurethral resection of prostate    . Coronary angioplasty with stent placement      stent in 2003; history of right common iliac artery stent hx  . Endarterectomy femoral Bilateral 11/11/2013    Procedure: BILATERAL FEMORAL ENDARTERECTOMIES WITH BILATERAL PATCH ANGIOPLASTIES;  Surgeon: Nada LibmanVance W Brabham, MD;  [facility];  Service: Vascular;  Laterality: Bilateral;    Oran ReinLaura Norberta Stobaugh, RD, LDN Clinical Inpatient Dietitian Pager:  [phone] Weekend and after hours pager:  [phone]

## 2014-06-12 NOTE — Progress Notes (Signed)
[facility] with stand by assistance with cords/leads. Pt tolerated movement without SOB or complication. As per MD order, pt transferred to 1514. Full report given to Charter CommunicationsN Emily.

## 2014-06-12 NOTE — Progress Notes (Signed)
OT Cancellation Note  Patient Details Name: Cheryle HorsfallJohnie E Gulley MRN: [id number] DOB: [birth year]   Cancelled Treatment:    Reason Eval/Treat Not Completed: OT screened, no needs identified, will sign off  [facility] CenterWARD,HILLARY Elfida Shimada, OTR/L  [phone] 06/12/2014 06/12/2014, 8:01 AM

## 2014-06-12 NOTE — Progress Notes (Signed)
CSW received consult for COPD Gold Protocol, though patient does not meet qualifications (only 1 admission within the past 6 months).   No further social work needs identified at this time.  CSW signing off.   Please re-consult if social work needs arise.   Loletta SpecterSuzanna Kidd, MSW, LCSW Clinical Social Work [phone]

## 2014-06-12 NOTE — Progress Notes (Signed)
Physical Therapy Treatment Patient Details Name: Cheryle HorsfallJohnie E Burmaster MRN: [id number] DOB: [birth year] Today's Date: 06/12/2014    History of Present Illness 80 yo male admitted with Pna, COPD exac. Hx of COPD, O2 with exertion, CAD, CVA, PVD. NSVT. 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 Multiple instances of LOB requiring external assist to prevent fall. Discussed use of AD for ambulation-will assess ambulation with use of cane on next session.   Follow Up Recommendations  Home health PT     Equipment Recommendations  None recommended by PT (pt states he has walker/cane at home)    Recommendations for Other Services       Precautions / Restrictions Precautions Precautions: Fall Precaution Comments: O2. Monitor vitals Restrictions Weight Bearing Restrictions: No    Mobility  Bed Mobility         Supine to sit: Supervision Sit to supine: Supervision      Transfers Overall transfer level: Needs assistance   Transfers: Sit to/from Stand Sit to Stand: Min guard         General transfer comment: close guard for safety  Ambulation/Gait Ambulation/Gait assistance: Min assist Ambulation Distance (Feet): 150 Feet Assistive device: None Gait Pattern/deviations: Step-through pattern;Decreased stride length;Drifts right/left;Staggering right;Staggering left     General Gait Details: LOB a few times during ambulation requiring external asssit to correct. Remained on [place] O2. Less dyspnea noted with use of O2.    Stairs            Wheelchair Mobility    Modified Rankin (Stroke Patients Only)       Balance Overall balance assessment: Needs assistance Sitting-balance support: No upper extremity supported;Feet unsupported Sitting balance-Leahy Scale: Good     Standing balance support: No upper extremity supported;During functional activity Standing balance-Leahy Scale: Fair                 High Level Balance Comments: had pt perform 4 square balance task for ~1 minute. LOB x 3 with difficulty keeping feet inside squares. Heel raises x 10 without UE support-LOB x2. Standing marching x 10 with 1 hand support-no LOB.     Cognition Arousal/Alertness: Awake/alert Behavior During Therapy: WFL for tasks assessed/performed Overall Cognitive Status: Within Functional Limits for tasks assessed                      Exercises      General Comments        Pertinent Vitals/Pain Pain Assessment: No/denies pain    Home Living                      Prior Function            PT Goals (current goals can now be found in the care plan section) Progress towards PT goals: Progressing toward goals    Frequency  Min 3X/week    PT Plan Current plan remains appropriate    Co-evaluation             End of Session Equipment Utilized During Treatment: Gait belt Activity Tolerance: Patient limited by fatigue Patient left: in bed;with call bell/phone within reach     Time: 1016-1044 PT Time Calculation (min): 28 min  Charges:  $Gait Training: 8-22 mins $Neuromuscular Re-education: 8-22 mins  G Codes:      Weston Anna, MPT Pager: [phone]

## 2014-06-12 NOTE — Progress Notes (Addendum)
[facility] PROGRESS NOTE  Alec HorsfallJohnie E Walker ZOX:[id number] DOB: 31-Jan-1927 DOA: 06/10/2014 PCP: Eartha InchBADGER,MICHAEL C, MD  Assessment/Plan  Acute on chronic hypoxic respiratory failure due to CAP and acute COPD exacerbation.  Required bipap briefly yesterday - Droplet precautions pending viral panel - Respiratory viral panel  - Blood cultures NGTD - Sputum culture pending - HIV NR - Legionella neg and S. pneumo ag neg - Continue Ceftriaxone and azithromycin day 2 - Wean oxygen as tolerated  - d/c solumedrol 60mg  and transition to prednisone - Duonebs q4h with albuterol q2h prn  - Cough suppressants as needed  - Continue symbicort  -  Repeat CXR  Severe sepsis (hypotension to 76/38, tachypnea, tachycardia, PNA source), resolving, BP improved -  abx and cultures as above -  ECHO:  Normal systolic function with EF of 55-60%, grade 1 DD and moderate focal basal septal hypertrophy which was present on previous ECHO, mildly dilated left atrium -  Cortisol level will be abnl due to patient already being on steroids, defer for now  CAD and PVD with episodic chest pain this morning. Chest pain may also be related to difficulty breathing secondary to pneumonia and COPD.  - Continue ASA, BB, statin  - Telemetry:  NSR with PVC - Troponins negative - Liver function panel and lipase wnl except for hypoalbuminemia -  NTG and morphine -  ECHO as above  PUD, stable, continue PPI   Suspect moderate protein calorie malnutrition  - Nutrition consult  - Regular diet  - Supplements as needed  Leukocytosis due to pneumonia and steroids -  Trend WBC  Normocytic anemia and mild thrombocytopenia likely due to marrow suppression from acute illness  Diet:  regular Access:  PIV IVF:  off Proph:  lovenox  Code Status: limited Family Communication: patient alone Disposition Plan:  Possible transfer to floor if no further episodes of severe dyspnea when transferring to chair or moving around  room.  Will need HHPT    Consultants:  None  Procedures:  CXR  Antibiotics:  Ceftriaxone and azithromycin 10/25 >>  HPI/Subjective:  Feeling better. Had two episodes of SOB yesterday, first required bipap, second was able to resolve with nebulizer treatment.  Had some indigestion which improved with GI cocktail.    Objective: Filed Vitals:   06/12/14 0000 06/12/14 0325 06/12/14 0400 06/12/14 0500  BP: 149/68     Pulse: 94     Temp: 98.3 F (36.8 C)  98.1 F (36.7 C) 99 F (37.2 C)  TempSrc: Oral  Oral   Resp: 27     Height:      Weight:   53.7 kg (118 lb 6.2 oz)   SpO2: 99% 99%      Intake/Output Summary (Last 24 hours) at 06/12/14 0701 Last data filed at 06/12/14 0100  Gross per 24 hour  Intake      0 ml  Output    200 ml  Net   -200 ml   Filed Weights   06/10/14 1703 06/12/14 0400  Weight: 51.9 kg (114 lb 6.7 oz) 53.7 kg (118 lb 6.2 oz)    Exam:   General:  Cachectic WM, No acute distress  HEENT:  NCAT, MMM  Cardiovascular:  RRR, nl S1, S2 no mrg, 2+ pulses, warm extremities  Respiratory:  Rales at bilateral bases, right worse than left with diminished BS at the right base, no obvious wheeze or rhonchi, no increased WOB  Abdomen:   NABS, soft, NT/ND  MSK:   Normal  tone and bulk, no LEE  Neuro:  Grossly intact  Data Reviewed: Basic Metabolic Panel:  Recent Labs Lab 06/10/14 1155 06/11/14 0714  NA 144 143  K 4.5 4.3  CL 108 107  CO2 23 22  GLUCOSE 169* 140*  BUN 22 26*  CREATININE 1.38* 1.28  CALCIUM 9.1 8.9   Liver Function Tests:  Recent Labs Lab 06/10/14 1805  AST 17  ALT 11  ALKPHOS 50  BILITOT <0.2*  PROT 6.0  ALBUMIN 3.0*    Recent Labs Lab 06/10/14 1805  LIPASE 12   No results found for this basename: AMMONIA,  in the last 168 hours CBC:  Recent Labs Lab 06/10/14 1155 06/11/14 0714  WBC 8.1 11.8*  HGB 12.8* 11.1*  HCT 40.8 33.8*  MCV 99.0 98.0  PLT 181 146*   Cardiac Enzymes:  Recent Labs Lab  06/10/14 1805 06/11/14 0110 06/11/14 0714  TROPONINI <0.30 <0.30 <0.30   BNP (last 3 results)  Recent Labs  06/10/14 1156  PROBNP 398.9   CBG: No results found for this basename: GLUCAP,  in the last 168 hours  Recent Results (from the past 240 hour(s))  CULTURE, BLOOD (ROUTINE X 2)     Status: None   Collection Time    06/10/14  3:06 PM      Result Value Ref Range Status   Specimen Description BLOOD RIGHT ANTECUBITAL   Final   Special Requests BOTTLES DRAWN AEROBIC AND ANAEROBIC 5CC EACH   Final   Culture  Setup Time     Final   Value: 06/10/2014 21:01     Performed at [facility]   Culture     Final   Value:        BLOOD CULTURE RECEIVED NO GROWTH TO DATE CULTURE WILL BE HELD FOR 5 DAYS BEFORE ISSUING A FINAL NEGATIVE REPORT     Performed at [facility]   Report Status PENDING   Incomplete  CULTURE, BLOOD (ROUTINE X 2)     Status: None   Collection Time    06/10/14  3:20 PM      Result Value Ref Range Status   Specimen Description BLOOD LEFT ANTECUBITAL   Final   Special Requests BOTTLES DRAWN AEROBIC AND ANAEROBIC 5CC EACH   Final   Culture  Setup Time     Final   Value: 06/10/2014 21:01     Performed at [facility]   Culture     Final   Value:        BLOOD CULTURE RECEIVED NO GROWTH TO DATE CULTURE WILL BE HELD FOR 5 DAYS BEFORE ISSUING A FINAL NEGATIVE REPORT     Performed at [facility]   Report Status PENDING   Incomplete  MRSA PCR SCREENING     Status: Abnormal   Collection Time    06/10/14  5:51 PM      Result Value Ref Range Status   MRSA by PCR POSITIVE (*) NEGATIVE Final   Comment:            The GeneXpert MRSA Assay (FDA     approved for NASAL specimens     only), is one component of a     comprehensive MRSA colonization     surveillance program. It is not     intended to diagnose MRSA     infection nor to guide or     monitor treatment for     MRSA infections.  RESULT CALLED TO, READ BACK BY AND VERIFIED  WITH:     C.DINNY,RN AT 2258 ON 06/10/14 BY SHEA.W  CULTURE, EXPECTORATED SPUTUM-ASSESSMENT     Status: None   Collection Time    06/10/14 10:47 PM      Result Value Ref Range Status   Specimen Description SPUTUM   Final   Special Requests NONE   Final   Sputum evaluation     Final   Value: THIS SPECIMEN IS ACCEPTABLE. RESPIRATORY CULTURE REPORT TO FOLLOW.   Report Status 06/10/2014 FINAL   Final     Studies: Dg Chest Port 1 View  06/10/2014   CLINICAL DATA:  Patient presents to the Emergency Department from home with shortness of breath. Patient has had a productive cough for a year. Patient has been taking antibiotics. Office is seen saturation is decreased 80%. Patient is tachypneic.  EXAM: PORTABLE CHEST - 1 VIEW  COMPARISON:  11/07/2013  FINDINGS: Since the prior exam, patchy areas of airspace consolidation have developed in the mid lungs and right lower lung, most confluent in the right lung base. Lungs are hyperexpanded. Mild underlying interstitial thickening is noted.  Cardiac silhouette is normal in size. Aorta is mildly uncoiled. No mediastinal or hilar masses. No pleural effusion or pneumothorax.  Bony thorax is demineralized but grossly intact.  IMPRESSION: 1. Patchy areas of airspace lung consolidation, most confluent in the right lower lung, consistent with multifocal pneumonia.   Electronically Signed   By: Amie Portlandavid  Ormond M.D.   On: 06/10/2014 12:24    Scheduled Meds: . aspirin EC  81 mg Oral QHS  . azithromycin  500 mg Intravenous Q24H  . budesonide-formoterol  2 puff Inhalation BID  . cefTRIAXone (ROCEPHIN)  IV  1 g Intravenous Q24H  . Chlorhexidine Gluconate Cloth  6 each Topical Q0600  . cholecalciferol  2,000 Units Oral QHS  . docusate sodium  100 mg Oral BID  . enoxaparin (LOVENOX) injection  30 mg Subcutaneous Q24H  . feeding supplement (ENSURE COMPLETE)  237 mL Oral BID BM  . ferrous sulfate  325 mg Oral TID WC  . ipratropium-albuterol  3 mL Nebulization Q4H  .  methylPREDNISolone (SOLU-MEDROL) injection  60 mg Intravenous BID  . metoprolol tartrate  25 mg Oral BID  . mirtazapine  30 mg Oral QHS  . mupirocin ointment  1 application Nasal BID  . pantoprazole  40 mg Oral BID  . pravastatin  40 mg Oral QHS  . senna  1 tablet Oral BID  . sodium chloride  3 mL Intravenous Q12H  . temazepam  30 mg Oral QHS   Continuous Infusions:   Active Problems:   Community acquired pneumonia   COPD exacerbation   Acute on chronic respiratory failure with hypoxia   Chest pain    Time spent: 30 min    Soundra Lampley, [facility] County [facility] CenterMACKENZIE  [facility] Pager [phone]. If 7PM-7AM, please contact night-coverage at [email or web address], password [email or web address] 06/12/2014, 7:01 AM  LOS: 2 days

## 2014-06-13 ENCOUNTER — Inpatient Hospital Stay (HOSPITAL_COMMUNITY): Payer: Medicare Other

## 2014-06-13 DIAGNOSIS — E43 Unspecified severe protein-calorie malnutrition: Secondary | ICD-10-CM | POA: Insufficient documentation

## 2014-06-13 LAB — CULTURE, RESPIRATORY: CULTURE: NORMAL

## 2014-06-13 LAB — BASIC METABOLIC PANEL
ANION GAP: 9 (ref 5–15)
BUN: 31 mg/dL — ABNORMAL HIGH (ref 6–23)
CALCIUM: 9.1 mg/dL (ref 8.4–10.5)
CO2: 27 mEq/L (ref 19–32)
CREATININE: 1.22 mg/dL (ref 0.50–1.35)
Chloride: 104 mEq/L (ref 96–112)
GFR calc Af Amer: 61 mL/min — ABNORMAL LOW (ref >90)
GFR, EST NON AFRICAN AMERICAN: 53 mL/min — AB (ref >90)
Glucose, Bld: 116 mg/dL — ABNORMAL HIGH (ref 70–99)
Potassium: 3.9 mEq/L (ref 3.7–5.3)
SODIUM: 140 meq/L (ref 137–147)

## 2014-06-13 LAB — CBC
HEMATOCRIT: 33.6 % — AB (ref 39.0–52.0)
Hemoglobin: 10.7 g/dL — ABNORMAL LOW (ref 13.0–17.0)
MCH: 31.6 pg (ref 26.0–34.0)
MCHC: 31.8 g/dL (ref 30.0–36.0)
MCV: 99.1 fL (ref 78.0–100.0)
PLATELETS: 156 10*3/uL (ref 150–400)
RBC: 3.39 MIL/uL — ABNORMAL LOW (ref 4.22–5.81)
RDW: 14.7 % (ref 11.5–15.5)
WBC: 8.7 10*3/uL (ref 4.0–10.5)

## 2014-06-13 MED ORDER — BOOST PLUS PO LIQD: 237.0000 mL | Freq: Three times a day (TID) | ORAL | Status: DC

## 2014-06-13 MED ORDER — NITROGLYCERIN 0.4 MG SL SUBL: 0.4000 mg | SUBLINGUAL_TABLET | SUBLINGUAL | Status: AC | PRN

## 2014-06-13 MED ORDER — PREDNISONE 20 MG PO TABS: ORAL_TABLET | ORAL | Status: DC

## 2014-06-13 MED ORDER — AMOXICILLIN-POT CLAVULANATE 875-125 MG PO TABS: 1.0000 | ORAL_TABLET | Freq: Two times a day (BID) | ORAL | Status: DC

## 2014-06-13 MED ORDER — ALBUTEROL SULFATE HFA 108 (90 BASE) MCG/ACT IN AERS: 2.0000 | INHALATION_SPRAY | RESPIRATORY_TRACT | Status: AC | PRN

## 2014-06-13 MED ORDER — TIOTROPIUM BROMIDE MONOHYDRATE 18 MCG IN CAPS: 18.0000 ug | ORAL_CAPSULE | Freq: Every day | RESPIRATORY_TRACT | Status: AC

## 2014-06-13 NOTE — Plan of Care (Signed)
Problem: Phase I Progression Outcomes Goal: O2 sats > or equal 90% or at baseline Outcome: Completed/Met Date Met:  06/13/14 Maintains greater than 90% on 3L [place].  Problem: Phase II Progression Outcomes Goal: Pain controlled on oral analgesia Outcome: Not Applicable Date Met:  69/45/03 No complaints of pain.  Problem: Phase I Progression Outcomes Goal: Pain controlled with appropriate interventions Outcome: Not Applicable Date Met:  88/82/80 Patient denies pain.

## 2014-06-13 NOTE — Progress Notes (Signed)
Clinical Social Work  Per Charity fundraiserN, patient is ready to DC home with PTAR. CSW confirmed patient's address and placed PTAR forms on chart. Patient reports wife is at home and ready for him. CSW coordinated PTAR and request #: [id number].  CSW is signing off.  KinseyHolly Daphne Karrer, KentuckyLCSW [phone]

## 2014-06-13 NOTE — Care Management Note (Signed)
    Page 1 of 2   06/13/2014     1:57:24 PM CARE MANAGEMENT NOTE 06/13/2014  Patient:  Alec Walker,Alec Walker   Account Number:  [id number]  Date Initiated:  06/13/2014  Documentation initiated by:  ,  Subjective/Objective Assessment:   adm: Community acquired pneumonia    COPD exacerbation     Action/Plan:   discharge planning   Anticipated DC Date:  06/13/2014   Anticipated DC Plan:  HOME W HOME HEALTH SERVICES      DC Planning Services  CM consult      PAC Choice  HOME HEALTH   Choice offered to / List presented to:  C-1 Patient   DME arranged  [facility]      DME agency  [facility]     HH arranged  HH-1 RN  HH-10 DISEASE MANAGEMENT  HH-2 PT  HH-3 OT      HH agency  [facility]   Status of service:  Completed, signed off Medicare Important Message given?  YES (If response is "NO", the following Medicare IM given date fields will be blank) Date Medicare IM given:  06/13/2014 Medicare IM given by:  , Date Additional Medicare IM given:   Additional Medicare IM given by:    Discharge Disposition:  HOME W HOME HEALTH SERVICES  Per UR Regulation:    If discussed at Long Length of Stay Meetings, dates discussed:    Comments:  06/13/14 13:30 CM met with  pt in room and with (wife on phone) to offer choice of home health agency.  Pt chooses [facility] to render HHPT/OT/RN. Referral called to [facility] rep, Kristen.   CM notes pt has his home O2 through [facility] and change in flow rate and will now be continuous rather than PRN.  CM called [facility] and spoke with IRIS and then to Chastity for confirmaiton pt is served by [facility] for O2 needs and faxed per IRIS's request facesheet, Orders, and Pulmonary Saturation note.  Also, Pt's wife requested [facility] come to the house and check the system.  Pt states the O2 works fine but CM made the request and Iris states an appointment will be made for today.  MD Walker-wired prescriptions to [facility] but pt  to go home by PTAAR and has requested we have them sent [facility] his [facility] [place].  CM called Brian at [facility] and he stated he would [facility] them to [facility].  CM faxed an additional prescription for prednisone RN gave CM so all prescriptions were faxed to [phone].  CM called [facility] and spoke with Jean who states she will be looking out for the prescriptions.  Pt states he has a friend who picks up his prescriptions for him.  CM called CSW to arrange for PTAAR home.  No other CM needs were communicated.   , BSN, CM [phone].   

## 2014-06-13 NOTE — Discharge Summary (Signed)
Physician Discharge Summary  DAGOBERTO NEALY XBD:[id number] DOB: 05-08-1927 DOA: 06/10/2014  PCP: Chesley Noon, MD  Admit date: 06/10/2014 Discharge date: 06/13/2014  Recommendations for Outpatient Follow-up:  1. Prednisone taper and ongoing antibiotics (augmentin) to complete a week of treatment 2. HH PT/OT/RN 3. PCP to please refer for stress test once he has completed antibiotics/steroids and is back to baseline.  Given nitroglycerin to use in the mean time. 4. PCP to repeat CXR in 4 weeks to ensure resolution  Discharge Diagnoses:  Active Problems:   Community acquired pneumonia   COPD exacerbation   Acute on chronic respiratory failure with hypoxia   Chest pain   Malnutrition of moderate degree   Discharge Condition: stable, improved  Diet recommendation: regular   Wt Readings from Last 3 Encounters:  06/12/14 53.7 kg (118 lb 6.2 oz)  03/27/14 54.159 kg (119 lb 6.4 oz)  11/28/13 56.473 kg (124 lb 8 oz)    History of present illness:  The patient is a 80 y.o. year-old male with history of COPD on 3L [place] with exertion, CAD, NSVT, PVD, CVA, CKD stage 3, PUD s/p partial gastrectomy who presents with SOB, worsening cough, subjective fever, sharp left chest pain with radiation to back.  He initially required nonrebreather and bipap.  CXR was notable for diffuse bilateral pneumonia worse on the right.  He was admitted for severe sepsis from CAP and COPD exacerbation.    Hospital Course:   Acute on chronic hypoxic respiratory failure due to CAP and acute COPD exacerbation. Required bipap briefly initially.  Viral panel, Legionella and S. pneumo ag were neg.   Blood cultures NGTD.  Sputum culture grew normal flora.  HIV NR.  He was started on ceftriaxone and azithromycin and completed 3 days of antibiotics in the hospital.  His CXR demonstrated improvement but recommend that he have follow up done in 4-6 weeks to ensure complete resolution.  He should continue augmentin to  complete a 7 day course.  He was also started on solumedrol which was tapered to prednisone.  He was given duonebs and he continued symbicort.     Severe sepsis (hypotension to 76/38, tachypnea, tachycardia, PNA source), resolved with IVF and antibiotics.  He did not require vasopressors.  ECHO: Normal systolic function with EF of 55-60%, grade 1 DD and moderate focal basal septal hypertrophy which was present on previous ECHO, mildly dilated left atrium.  Cortisol level was not obtained because patient was already given steroids.  CAD and PVD with episodic left lower chest pain. Chest pain was atypical and was likely related to difficulty breathing secondary to pneumonia and COPD.  Continued ASA, BB, statin.  Telemetry: NSR with PVC.  Troponins were negative.  ECHO as above.  Liver function panel and lipase wnl except for hypoalbuminemia.  Recommend that he follow up with cardiology for possible stress test once he has improved from his pneumonia.  PCP to refer please.    PUD, stable, continued PPI.    Moderate protein calorie malnutrition.  Seen by nutrition and given regular diet with supplements  Leukocytosis due to pneumonia and steroids, resolved.   Normocytic anemia and mild thrombocytopenia likely due to marrow suppression from acute illness   Consultants:  None Procedures:  CXR Antibiotics:  Ceftriaxone and azithromycin 10/25 >>   Discharge Exam: Filed Vitals:   06/13/14 0537  BP: 153/65  Pulse: 68  Temp: 98.5 F (36.9 C)  Resp: 20   Filed Vitals:   06/12/14 1710 06/12/14 2122  06/13/14 0537 06/13/14 0748  BP:  105/65 153/65   Pulse:  86 68   Temp:  98.6 F (37 C) 98.5 F (36.9 C)   TempSrc:  Oral Oral   Resp:  20 20   Height:      Weight:      SpO2: 97% 97% 100% 99%    General: Frail appearing WM, No acute distress  HEENT: NCAT, MMM  Cardiovascular: RRR, nl S1, S2 no mrg, 2+ pulses, warm extremities  Respiratory: Diminished BS throughout right side, no  obvious wheeze or rhonchi, no increased WOB  Abdomen: NABS, soft, NT/ND  MSK: Normal tone and bulk, no LEE  Neuro: Grossly intact   Discharge Instructions      Discharge Instructions   Call MD for:  difficulty breathing, headache or visual disturbances    Complete by:  As directed      Call MD for:  extreme fatigue    Complete by:  As directed      Call MD for:  hives    Complete by:  As directed      Call MD for:  persistant dizziness or light-headedness    Complete by:  As directed      Call MD for:  persistant nausea and vomiting    Complete by:  As directed      Call MD for:  severe uncontrolled pain    Complete by:  As directed      Call MD for:  temperature >100.4    Complete by:  As directed      Diet general    Complete by:  As directed      Discharge instructions    Complete by:  As directed   You were hospitalized with pneumonia.  Please continue augmentin antibiotic twice a day every day until gone.  Your next dose is due this evening.  You should also take prednisone in a decreasing dose over the next few days.  I have given you prescriptions for spiriva to use once a day and an albuterol inhaler to use as needed for shortness of breath.  Please call [phone] if you experience difficulty breathing.  Follow up with your primary care doctor in 1 week or sooner as needed.  You may need to see a cardiologist for your chest pain once you have completed treatment for your pneumonia.     Increase activity slowly    Complete by:  As directed             Medication List         albuterol 108 (90 BASE) MCG/ACT inhaler  Commonly known as:  PROVENTIL HFA;VENTOLIN HFA  Inhale 2 puffs into the lungs every 4 (four) hours as needed for wheezing or shortness of breath.     amoxicillin-clavulanate 875-125 MG per tablet  Commonly known as:  AUGMENTIN  Take 1 tablet by mouth 2 (two) times daily.     aspirin EC 81 MG tablet  Take 81 mg by mouth at bedtime.      budesonide-formoterol 160-4.5 MCG/ACT inhaler  Commonly known as:  SYMBICORT  Inhale 2 puffs into the lungs 2 (two) times daily.     ferrous sulfate 325 (65 FE) MG EC tablet  Take 325 mg by mouth 3 (three) times daily with meals.     lactose free nutrition Liqd  Take 237 mLs by mouth 3 (three) times daily with meals.     metoprolol tartrate 25 MG tablet  Commonly  known as:  LOPRESSOR  Take 25 mg by mouth 2 (two) times daily.     mirtazapine 30 MG tablet  Commonly known as:  REMERON  Take 30 mg by mouth at bedtime.     nitroGLYCERIN 0.4 MG SL tablet  Commonly known as:  NITROSTAT  Place 1 tablet (0.4 mg total) under the tongue every 5 (five) minutes as needed for chest pain.     OXYGEN  Inhale into the lungs as needed. 2 liters     pantoprazole 40 MG tablet  Commonly known as:  PROTONIX  Take 40 mg by mouth 2 (two) times daily.     pravastatin 40 MG tablet  Commonly known as:  PRAVACHOL  Take 40 mg by mouth at bedtime.     predniSONE 20 MG tablet  Commonly known as:  DELTASONE  Take 3 tabs daily x 2 days, 2 tabs daily x 2 days, 1 tab daily x 2 days, half tab daily x 2 days, then stop     temazepam 30 MG capsule  Commonly known as:  RESTORIL  Take 30 mg by mouth at bedtime.     tiotropium 18 MCG inhalation capsule  Commonly known as:  SPIRIVA HANDIHALER  Place 1 capsule (18 mcg total) into inhaler and inhale daily.     Vitamin D 2000 UNITS tablet  Take 2,000 Units by mouth at bedtime.       Follow-up Information   Follow up with BADGER,MICHAEL C, MD. Schedule an appointment as soon as possible for a visit in 1 week.   Specialty:  Family Medicine   Contact information:   [place] [id number] [phone]        The results of significant diagnostics from this hospitalization (including imaging, microbiology, ancillary and laboratory) are listed below for reference.    Significant Diagnostic Studies: Dg Chest Port 1 View  06/12/2014    CLINICAL DATA:  Followup pneumonia, continued shortness of breath  EXAM: PORTABLE CHEST - 1 VIEW  COMPARISON:  06/10/2014  FINDINGS: The heart size and vascular pattern are normal. The aortic arch is calcified.  There is severe interstitial change diffusely bilaterally. More focal infiltrate is seen laterally in the lingula and in the right lower lobe. Lingular infiltrate is not significantly different from prior study, while there has been moderate improvement in right lower lobe infiltrate. No significant pleural effusions.  IMPRESSION: Continued diffuse interstitial change, with stable infiltrate in the lingula and improved infiltrate in the right lower lobe.   Electronically Signed   By: Skipper Cliche M.D.   On: 06/12/2014 08:10   Dg Chest Port 1 View  06/10/2014   CLINICAL DATA:  Patient presents to the Emergency Department from home with shortness of breath. Patient has had a productive cough for a year. Patient has been taking antibiotics. Office is seen saturation is decreased 80%. Patient is tachypneic.  EXAM: PORTABLE CHEST - 1 VIEW  COMPARISON:  11/07/2013  FINDINGS: Since the prior exam, patchy areas of airspace consolidation have developed in the mid lungs and right lower lung, most confluent in the right lung base. Lungs are hyperexpanded. Mild underlying interstitial thickening is noted.  Cardiac silhouette is normal in size. Aorta is mildly uncoiled. No mediastinal or hilar masses. No pleural effusion or pneumothorax.  Bony thorax is demineralized but grossly intact.  IMPRESSION: 1. Patchy areas of airspace lung consolidation, most confluent in the right lower lung, consistent with multifocal pneumonia.   Electronically Signed  By: Lajean Manes M.D.   On: 06/10/2014 12:24    Microbiology: Recent Results (from the past 240 hour(s))  CULTURE, BLOOD (ROUTINE X 2)     Status: None   Collection Time    06/10/14  3:06 PM      Result Value Ref Range Status   Specimen Description BLOOD  RIGHT ANTECUBITAL   Final   Special Requests BOTTLES DRAWN AEROBIC AND ANAEROBIC [facility] EACH   Final   Culture  Setup Time     Final   Value: 06/10/2014 21:01     Performed at [facility]   Culture     Final   Value:        BLOOD CULTURE RECEIVED NO GROWTH TO DATE CULTURE WILL BE HELD FOR 5 DAYS BEFORE ISSUING A FINAL NEGATIVE REPORT     Performed at [facility]   Report Status PENDING   Incomplete  CULTURE, BLOOD (ROUTINE X 2)     Status: None   Collection Time    06/10/14  3:20 PM      Result Value Ref Range Status   Specimen Description BLOOD LEFT ANTECUBITAL   Final   Special Requests BOTTLES DRAWN AEROBIC AND ANAEROBIC 5CC EACH   Final   Culture  Setup Time     Final   Value: 06/10/2014 21:01     Performed at [facility]   Culture     Final   Value:        BLOOD CULTURE RECEIVED NO GROWTH TO DATE CULTURE WILL BE HELD FOR 5 DAYS BEFORE ISSUING A FINAL NEGATIVE REPORT     Performed at [facility]   Report Status PENDING   Incomplete  MRSA PCR SCREENING     Status: Abnormal   Collection Time    06/10/14  5:51 PM      Result Value Ref Range Status   MRSA by PCR POSITIVE (*) NEGATIVE Final   Comment:            The GeneXpert MRSA Assay (FDA     approved for NASAL specimens     only), is one component of a     comprehensive MRSA colonization     surveillance program. It is not     intended to diagnose MRSA     infection nor to guide or     monitor treatment for     MRSA infections.     RESULT CALLED TO, READ BACK BY AND VERIFIED WITH:     C.DINNY,RN AT 2258 ON 06/10/14 BY [email or web address].W  RESPIRATORY VIRUS PANEL     Status: None   Collection Time    06/10/14  6:35 PM      Result Value Ref Range Status   Source - RVPAN NASAL SWAB   Corrected   Comment: CORRECTED ON 10/26 AT 1820: PREVIOUSLY REPORTED AS NASAL SWAB   Respiratory Syncytial Virus A NOT DETECTED   Final   Respiratory Syncytial Virus B NOT DETECTED   Final   Influenza A NOT DETECTED    Final   Influenza B NOT DETECTED   Final   Parainfluenza 1 NOT DETECTED   Final   Parainfluenza 2 NOT DETECTED   Final   Parainfluenza 3 NOT DETECTED   Final   Metapneumovirus NOT DETECTED   Final   Rhinovirus NOT DETECTED   Final   Adenovirus NOT DETECTED   Final   Influenza A H1 NOT DETECTED   Final  Influenza A H3 NOT DETECTED   Final   Comment: (NOTE)           Normal Reference Range for each Analyte: NOT DETECTED     Testing performed using the Luminex xTAG Respiratory Viral Panel test     kit.     The analytical performance characteristics of this assay have been     determined by [facility].  The modifications have not been     cleared or approved by the FDA. This assay has been validated pursuant     to the CLIA regulations and is used for clinical purposes.     Performed at [place], EXPECTORATED SPUTUM-ASSESSMENT     Status: None   Collection Time    06/10/14 10:47 PM      Result Value Ref Range Status   Specimen Description SPUTUM   Final   Special Requests NONE   Final   Sputum evaluation     Final   Value: THIS SPECIMEN IS ACCEPTABLE. RESPIRATORY CULTURE REPORT TO FOLLOW.   Report Status 06/10/2014 FINAL   Final  CULTURE, RESPIRATORY (NON-EXPECTORATED)     Status: None   Collection Time    06/10/14 10:47 PM      Result Value Ref Range Status   Specimen Description SPUTUM   Final   Special Requests NONE   Final   Gram Stain     Final   Value: ABUNDANT WBC PRESENT, PREDOMINANTLY PMN     RARE SQUAMOUS EPITHELIAL CELLS PRESENT     FEW GRAM POSITIVE COCCI     IN PAIRS RARE GRAM NEGATIVE RODS     Performed at [facility]   Culture     Final   Value: NORMAL OROPHARYNGEAL FLORA     Performed at [facility]   Report Status 06/13/2014 FINAL   Final     Labs: Basic Metabolic Panel:  Recent Labs Lab 06/10/14 1155 06/11/14 0714 06/13/14 0441  NA 144 143 140  K 4.5 4.3 3.9  CL 108 107 104  CO2 23 22 27   GLUCOSE  169* 140* 116*  BUN 22 26* 31*  CREATININE 1.38* 1.28 1.22  CALCIUM 9.1 8.9 9.1   Liver Function Tests:  Recent Labs Lab 06/10/14 1805  AST 17  ALT 11  ALKPHOS 50  BILITOT <0.2*  PROT 6.0  ALBUMIN 3.0*    Recent Labs Lab 06/10/14 1805  LIPASE 12   No results found for this basename: AMMONIA,  in the last 168 hours CBC:  Recent Labs Lab 06/10/14 1155 06/11/14 0714 06/13/14 0441  WBC 8.1 11.8* 8.7  HGB 12.8* 11.1* 10.7*  HCT 40.8 33.8* 33.6*  MCV 99.0 98.0 99.1  PLT 181 146* 156   Cardiac Enzymes:  Recent Labs Lab 06/10/14 1805 06/11/14 0110 06/11/14 0714  TROPONINI <0.30 <0.30 <0.30   BNP: BNP (last 3 results)  Recent Labs  06/10/14 1156  PROBNP 398.9   CBG: No results found for this basename: GLUCAP,  in the last 168 hours  Time coordinating discharge: 45 minutes  Signed:  Jowan Skillin  [facility] 06/13/2014, 10:48 AM

## 2014-06-13 NOTE — Progress Notes (Signed)
SATURATION QUALIFICATIONS: (This note is used to comply with regulatory documentation for home oxygen)  Patient Saturations on Room Air at Rest = 85%  Patient Saturations on Room Air while Ambulating = %  Patient Saturations on 4 Liters of oxygen while Ambulating = 98%  Please briefly explain why patient needs home oxygen:

## 2014-06-13 NOTE — Progress Notes (Signed)
Discharge instructions reviewed with patient using teach back method and he demonstrated understanding.  Patient stable for discharge home.  Patient's assessment unchanged from this am.  Patient has friend that will pick up prescriptions for him from his [facility].  Patient understands follow up instructions and when he would need to notify the MD.  Alec Walker, Alec Walker  06/13/2014

## 2014-06-16 LAB — CULTURE, BLOOD (ROUTINE X 2)
Culture: NO GROWTH
Culture: NO GROWTH

## 2014-06-25 ENCOUNTER — Other Ambulatory Visit: Payer: Self-pay | Admitting: Cardiovascular Disease

## 2014-06-26 NOTE — Telephone Encounter (Signed)
E sent to pharmacy 

## 2014-07-27 ENCOUNTER — Encounter (HOSPITAL_COMMUNITY): Payer: Self-pay | Admitting: Cardiovascular Disease

## 2014-09-19 ENCOUNTER — Other Ambulatory Visit (HOSPITAL_COMMUNITY): Payer: Self-pay | Admitting: Family Medicine

## 2014-09-19 DIAGNOSIS — R1310 Dysphagia, unspecified: Secondary | ICD-10-CM

## 2014-09-25 ENCOUNTER — Other Ambulatory Visit (HOSPITAL_COMMUNITY): Payer: Self-pay | Admitting: Family Medicine

## 2014-09-25 ENCOUNTER — Ambulatory Visit (HOSPITAL_COMMUNITY)
Admission: RE | Admit: 2014-09-25 | Discharge: 2014-09-25 | Disposition: A | Discharge status: Home / Self Care | Payer: Medicare Other | Source: Ambulatory Visit | Attending: Family Medicine | Admitting: Family Medicine

## 2014-09-25 DIAGNOSIS — K219 Gastro-esophageal reflux disease without esophagitis: Secondary | ICD-10-CM | POA: Diagnosis not present

## 2014-09-25 DIAGNOSIS — R1310 Dysphagia, unspecified: Secondary | ICD-10-CM

## 2014-09-25 DIAGNOSIS — R0600 Dyspnea, unspecified: Secondary | ICD-10-CM | POA: Diagnosis present

## 2014-11-25 ENCOUNTER — Emergency Department (HOSPITAL_COMMUNITY): Payer: Medicare Other

## 2014-11-25 ENCOUNTER — Observation Stay (HOSPITAL_COMMUNITY)
Admission: EM | Admit: 2014-11-25 | Discharge: 2014-11-28 | Disposition: A | Discharge status: Home / Self Care | Payer: Medicare Other | Attending: Internal Medicine | Admitting: Internal Medicine

## 2014-11-25 ENCOUNTER — Encounter (HOSPITAL_COMMUNITY): Payer: Self-pay | Admitting: Emergency Medicine

## 2014-11-25 DIAGNOSIS — I2510 Atherosclerotic heart disease of native coronary artery without angina pectoris: Secondary | ICD-10-CM | POA: Diagnosis not present

## 2014-11-25 DIAGNOSIS — N179 Acute kidney failure, unspecified: Secondary | ICD-10-CM | POA: Insufficient documentation

## 2014-11-25 DIAGNOSIS — I472 Ventricular tachycardia: Secondary | ICD-10-CM | POA: Diagnosis not present

## 2014-11-25 DIAGNOSIS — Z9861 Coronary angioplasty status: Secondary | ICD-10-CM | POA: Diagnosis not present

## 2014-11-25 DIAGNOSIS — I739 Peripheral vascular disease, unspecified: Secondary | ICD-10-CM | POA: Diagnosis not present

## 2014-11-25 DIAGNOSIS — N183 Chronic kidney disease, stage 3 (moderate): Secondary | ICD-10-CM | POA: Insufficient documentation

## 2014-11-25 DIAGNOSIS — Z791 Long term (current) use of non-steroidal anti-inflammatories (NSAID): Secondary | ICD-10-CM | POA: Insufficient documentation

## 2014-11-25 DIAGNOSIS — Z8711 Personal history of peptic ulcer disease: Secondary | ICD-10-CM | POA: Diagnosis not present

## 2014-11-25 DIAGNOSIS — I25709 Atherosclerosis of coronary artery bypass graft(s), unspecified, with unspecified angina pectoris: Secondary | ICD-10-CM

## 2014-11-25 DIAGNOSIS — J42 Unspecified chronic bronchitis: Secondary | ICD-10-CM | POA: Diagnosis not present

## 2014-11-25 DIAGNOSIS — Z9981 Dependence on supplemental oxygen: Secondary | ICD-10-CM | POA: Insufficient documentation

## 2014-11-25 DIAGNOSIS — Z7951 Long term (current) use of inhaled steroids: Secondary | ICD-10-CM | POA: Diagnosis not present

## 2014-11-25 DIAGNOSIS — Z8673 Personal history of transient ischemic attack (TIA), and cerebral infarction without residual deficits: Secondary | ICD-10-CM | POA: Diagnosis not present

## 2014-11-25 DIAGNOSIS — I129 Hypertensive chronic kidney disease with stage 1 through stage 4 chronic kidney disease, or unspecified chronic kidney disease: Secondary | ICD-10-CM | POA: Insufficient documentation

## 2014-11-25 DIAGNOSIS — Z7982 Long term (current) use of aspirin: Secondary | ICD-10-CM | POA: Diagnosis not present

## 2014-11-25 DIAGNOSIS — J449 Chronic obstructive pulmonary disease, unspecified: Secondary | ICD-10-CM | POA: Diagnosis present

## 2014-11-25 DIAGNOSIS — R110 Nausea: Secondary | ICD-10-CM

## 2014-11-25 DIAGNOSIS — I209 Angina pectoris, unspecified: Secondary | ICD-10-CM

## 2014-11-25 DIAGNOSIS — R079 Chest pain, unspecified: Secondary | ICD-10-CM | POA: Diagnosis present

## 2014-11-25 DIAGNOSIS — J441 Chronic obstructive pulmonary disease with (acute) exacerbation: Secondary | ICD-10-CM | POA: Insufficient documentation

## 2014-11-25 DIAGNOSIS — I779 Disorder of arteries and arterioles, unspecified: Secondary | ICD-10-CM | POA: Diagnosis present

## 2014-11-25 DIAGNOSIS — E785 Hyperlipidemia, unspecified: Secondary | ICD-10-CM | POA: Diagnosis not present

## 2014-11-25 DIAGNOSIS — R0602 Shortness of breath: Secondary | ICD-10-CM

## 2014-11-25 DIAGNOSIS — I10 Essential (primary) hypertension: Secondary | ICD-10-CM | POA: Diagnosis not present

## 2014-11-25 DIAGNOSIS — Z79899 Other long term (current) drug therapy: Secondary | ICD-10-CM | POA: Diagnosis not present

## 2014-11-25 DIAGNOSIS — Z87891 Personal history of nicotine dependence: Secondary | ICD-10-CM | POA: Diagnosis not present

## 2014-11-25 DIAGNOSIS — E43 Unspecified severe protein-calorie malnutrition: Secondary | ICD-10-CM | POA: Insufficient documentation

## 2014-11-25 LAB — COMPREHENSIVE METABOLIC PANEL
ALBUMIN: 3 g/dL — AB (ref 3.5–5.2)
ALK PHOS: 60 U/L (ref 39–117)
ALT: 13 U/L (ref 0–53)
AST: 18 U/L (ref 0–37)
Anion gap: 8 (ref 5–15)
BILIRUBIN TOTAL: 0.3 mg/dL (ref 0.3–1.2)
BUN: 39 mg/dL — ABNORMAL HIGH (ref 6–23)
CALCIUM: 8.6 mg/dL (ref 8.4–10.5)
CHLORIDE: 110 mmol/L (ref 96–112)
CO2: 24 mmol/L (ref 19–32)
Creatinine, Ser: 2.33 mg/dL — ABNORMAL HIGH (ref 0.50–1.35)
GFR calc Af Amer: 28 mL/min — ABNORMAL LOW (ref >90)
GFR calc non Af Amer: 24 mL/min — ABNORMAL LOW (ref >90)
GLUCOSE: 64 mg/dL — AB (ref 70–99)
Potassium: 4.5 mmol/L (ref 3.5–5.1)
Sodium: 142 mmol/L (ref 135–145)
Total Protein: 5.6 g/dL — ABNORMAL LOW (ref 6.0–8.3)

## 2014-11-25 LAB — CBC WITH DIFFERENTIAL/PLATELET
BASOS ABS: 0 10*3/uL (ref 0.0–0.1)
Basophils Relative: 1 % (ref 0–1)
Eosinophils Absolute: 0.2 10*3/uL (ref 0.0–0.7)
Eosinophils Relative: 4 % (ref 0–5)
HCT: 34.6 % — ABNORMAL LOW (ref 39.0–52.0)
Hemoglobin: 10.8 g/dL — ABNORMAL LOW (ref 13.0–17.0)
LYMPHS PCT: 19 % (ref 12–46)
Lymphs Abs: 1.1 10*3/uL (ref 0.7–4.0)
MCH: 30.7 pg (ref 26.0–34.0)
MCHC: 31.2 g/dL (ref 30.0–36.0)
MCV: 98.3 fL (ref 78.0–100.0)
MONOS PCT: 11 % (ref 3–12)
Monocytes Absolute: 0.6 10*3/uL (ref 0.1–1.0)
NEUTROS ABS: 3.9 10*3/uL (ref 1.7–7.7)
Neutrophils Relative %: 65 % (ref 43–77)
PLATELETS: 226 10*3/uL (ref 150–400)
RBC: 3.52 MIL/uL — ABNORMAL LOW (ref 4.22–5.81)
RDW: 15.5 % (ref 11.5–15.5)
WBC: 5.9 10*3/uL (ref 4.0–10.5)

## 2014-11-25 LAB — TROPONIN I
Troponin I: <0.03 ng/mL (ref <0.031)
Troponin I: <0.03 ng/mL (ref <0.031)
Troponin I: <0.03 ng/mL (ref <0.031)

## 2014-11-25 LAB — MRSA PCR SCREENING: MRSA BY PCR: POSITIVE — AB

## 2014-11-25 LAB — LIPASE, BLOOD: LIPASE: 36 U/L (ref 11–59)

## 2014-11-25 MED ORDER — TIOTROPIUM BROMIDE MONOHYDRATE 18 MCG IN CAPS
18.0000 ug | ORAL_CAPSULE | Freq: Every day | RESPIRATORY_TRACT | Status: DC
  Administered 2014-11-27 – 2014-11-28 (×2): 18 ug via RESPIRATORY_TRACT
  Filled 2014-11-25 (×2): qty 5

## 2014-11-25 MED ORDER — MIRTAZAPINE 7.5 MG PO TABS
30.0000 mg | ORAL_TABLET | Freq: Every day | ORAL | Status: DC
  Administered 2014-11-25 – 2014-11-27 (×3): 30 mg via ORAL
  Filled 2014-11-25 (×3): qty 4

## 2014-11-25 MED ORDER — ENOXAPARIN SODIUM 40 MG/0.4ML ~~LOC~~ SOLN: 40.0000 mg | SUBCUTANEOUS | Status: DC

## 2014-11-25 MED ORDER — CHLORHEXIDINE GLUCONATE CLOTH 2 % EX PADS
6.0000 | MEDICATED_PAD | Freq: Every day | CUTANEOUS | Status: DC
Start: 2014-11-26 — End: 2014-11-28
  Administered 2014-11-26 – 2014-11-28 (×3): 6 via TOPICAL

## 2014-11-25 MED ORDER — SODIUM CHLORIDE 0.9 % IV SOLN
INTRAVENOUS | Status: AC
  Administered 2014-11-25: 20:00:00 via INTRAVENOUS

## 2014-11-25 MED ORDER — MUPIROCIN 2 % EX OINT
1.0000 "application " | TOPICAL_OINTMENT | Freq: Two times a day (BID) | CUTANEOUS | Status: DC
  Administered 2014-11-26 – 2014-11-28 (×6): 1 via NASAL
  Filled 2014-11-25: qty 22

## 2014-11-25 MED ORDER — HEPARIN SODIUM (PORCINE) 5000 UNIT/ML IJ SOLN
5000.0000 [IU] | Freq: Three times a day (TID) | INTRAMUSCULAR | Status: DC
  Administered 2014-11-25 – 2014-11-28 (×9): 5000 [IU] via SUBCUTANEOUS
  Filled 2014-11-25 (×8): qty 1

## 2014-11-25 MED ORDER — ACETAMINOPHEN 325 MG PO TABS: 650.0000 mg | ORAL_TABLET | ORAL | Status: DC | PRN

## 2014-11-25 MED ORDER — PANTOPRAZOLE SODIUM 40 MG PO TBEC
40.0000 mg | DELAYED_RELEASE_TABLET | Freq: Two times a day (BID) | ORAL | Status: DC
  Administered 2014-11-25 – 2014-11-28 (×6): 40 mg via ORAL
  Filled 2014-11-25 (×6): qty 1

## 2014-11-25 MED ORDER — METOPROLOL TARTRATE 25 MG PO TABS
25.0000 mg | ORAL_TABLET | Freq: Two times a day (BID) | ORAL | Status: DC
  Administered 2014-11-26: 25 mg via ORAL
  Filled 2014-11-25 (×2): qty 1

## 2014-11-25 MED ORDER — SODIUM CHLORIDE 0.9 % IV SOLN
Freq: Once | INTRAVENOUS | Status: AC
  Administered 2014-11-25: 16:00:00 via INTRAVENOUS

## 2014-11-25 MED ORDER — ASPIRIN EC 81 MG PO TBEC
81.0000 mg | DELAYED_RELEASE_TABLET | Freq: Every day | ORAL | Status: DC
  Administered 2014-11-25 – 2014-11-27 (×3): 81 mg via ORAL
  Filled 2014-11-25 (×3): qty 1

## 2014-11-25 MED ORDER — BUDESONIDE-FORMOTEROL FUMARATE 160-4.5 MCG/ACT IN AERO
2.0000 | INHALATION_SPRAY | Freq: Two times a day (BID) | RESPIRATORY_TRACT | Status: DC
  Administered 2014-11-27: 2 via RESPIRATORY_TRACT
  Filled 2014-11-25 (×2): qty 6

## 2014-11-25 MED ORDER — SODIUM CHLORIDE 0.9 % IV SOLN: Freq: Once | INTRAVENOUS | Status: DC

## 2014-11-25 MED ORDER — DIAZEPAM 5 MG PO TABS
5.0000 mg | ORAL_TABLET | Freq: Two times a day (BID) | ORAL | Status: DC | PRN
  Administered 2014-11-26 – 2014-11-27 (×2): 5 mg via ORAL
  Filled 2014-11-25 (×2): qty 1

## 2014-11-25 MED ORDER — ALBUTEROL SULFATE (2.5 MG/3ML) 0.083% IN NEBU: 2.5000 mg | INHALATION_SOLUTION | RESPIRATORY_TRACT | Status: DC | PRN

## 2014-11-25 MED ORDER — ONDANSETRON HCL 4 MG/2ML IJ SOLN
4.0000 mg | Freq: Four times a day (QID) | INTRAMUSCULAR | Status: DC | PRN
  Administered 2014-11-28: 4 mg via INTRAVENOUS
  Filled 2014-11-25: qty 2

## 2014-11-25 NOTE — ED Notes (Signed)
[place] Octaveheresia Wilson - POA/daughter - [phone]

## 2014-11-25 NOTE — H&P (Signed)
History and Physical    Alec Walker VQQ:[id number] DOB: Jan 02, 1927 DOA: 11/25/2014  Referring physician: Dr. Manus Gunning PCP: Eartha Inch, MD  Specialists: none  Chief Complaint: chest pain  HPI: Alec Walker is a 81 y.o. male has a past medical history significant for COPD, coronary artery disease, hypertension, chronic kidney disease stage III, hyperlipidemia, presents to the emergency room with a chief complaint chest pain. Patient tells me that yesterday and 2 days ago had an episode of substernal chest pain is radiating across his chest, felt like pressure, lasted about 45 minutes. He was sitting down at a time. He denies any new shortness of breath. He has chronic oxygen at home, and normally is somewhat dyspneic, however he feels like he is at baseline. He denies any recent fever or chills. He endorses chronic abdominal pain with nausea, denies any vomiting. He endorses chronic lightheadedness and dizziness for several years without any change in its characteristic. He denies any lower extremity swelling. He never had chest pain like this before. He has intermittent reflux type symptoms, however this was different. In the emergency room, patient has stable vital signs, his blood work shows a creatinine of 2.3 which is a little bit higher than his baseline, his initial troponin is negative. His chest x-ray was without acute cardiopulmonary disease. His EKG showed ST depression in V2, 3, lead 2, which have been there before. EDP discussed the case and the EKG findings were discussed with on-call cardiology, and findings on the EKG were deemed not worrisome and recommendations were for observation for chest pain and cycle troponins overnight. TRH was asked for admission.   Review of Systems: The patient denies anorexia, fever, weight loss, vision loss, and rest 10 point ROS negative   Past Medical History  Diagnosis Date  . COPD (chronic obstructive pulmonary disease)   . CAD  (coronary artery disease)     a.  nonobstructive CAD by cath 1999. b. Myoview 04/2012: fixed inferior and inferoseptal bowel attenuation artifact, no reversible ischemia. EF 62%. Low risk scan.  Marland Kitchen HTN (hypertension)   . PUD (peptic ulcer disease)     a. s/p surgery 1976.  Marland Kitchen Syncope     2012  . Carotid artery disease 03/22/13    a. 50-69% BICA by duplex 03/2013, repeat needed 11/2013.  Marland Kitchen PAD (peripheral artery disease)     a. prior stenting of RCIA 1999. b. history of 90% vertebral artery narrowing, 40% subclavian artery narrowing. c. Diagnostic PV angio 08/2013 for progressive claudication - will ultimately need endarterectomy/patch angio on bilat CFA stenosis; will also need atherectomy/PT/stenting to REIA.   . CKD (chronic kidney disease), stage III   . HLD (hyperlipidemia)   . Chronic respiratory failure     a. On home O2.  Marland Kitchen NSVT (nonsustained ventricular tachycardia)     a. Brief NSVT (6b) during 08/2013 adm.  . Stroke   . Shortness of breath   . On supplemental oxygen therapy     2l   Past Surgical History  Procedure Laterality Date  . Cataract extraction    . Gastrectomy    . Transurethral resection of prostate    . Coronary angioplasty with stent placement      stent in 2003; history of right common iliac artery stent hx  . Endarterectomy femoral Bilateral 11/11/2013    Procedure: BILATERAL FEMORAL ENDARTERECTOMIES WITH BILATERAL PATCH ANGIOPLASTIES;  Surgeon: Nada Libman, MD;  Location: [facility] OR;  Service: Vascular;  Laterality: Bilateral;  .  Lower extremity angiogram N/A 09/01/2013    Procedure: LOWER EXTREMITY ANGIOGRAM;  Surgeon: Runell GessJonathan J Berry, MD;  [facility];  Service: Cardiovascular;  Laterality: N/A;   Social History:  reports that he quit smoking about 17 years ago. His smoking use included Cigarettes. He has a 60 pack-year smoking history. He has never used smokeless tobacco. He reports that he does not drink alcohol or use illicit drugs.  No Known  Allergies  Family History  Problem Relation Age of Onset  . Hypertension    . Coronary artery disease    . Stroke Mother   . Heart disease Mother   . Hypertension Mother   . Heart attack Mother   . Heart attack Brother   . Heart disease Brother   . Heart disease Father   . Hypertension Father   . Heart attack Father     Prior to Admission medications   Medication Sig Start Date End Date Taking? Authorizing Provider  albuterol (PROVENTIL HFA;VENTOLIN HFA) 108 (90 BASE) MCG/ACT inhaler Inhale 2 puffs into the lungs every 4 (four) hours as needed for wheezing or shortness of breath. 06/13/14  Yes Renae FickleMackenzie Short, MD  aspirin EC 81 MG tablet Take 81 mg by mouth at bedtime.   Yes Historical Provider, MD  budesonide-formoterol (SYMBICORT) 160-4.5 MCG/ACT inhaler Inhale 2 puffs into the lungs 2 (two) times daily.    Yes Historical Provider, MD  diazepam (VALIUM) 5 MG tablet Take 5 mg by mouth every 8 (eight) hours as needed (sleep).   Yes Historical Provider, MD  diclofenac (VOLTAREN) 75 MG EC tablet Take 75 mg by mouth 2 (two) times daily.   Yes Historical Provider, MD  metoprolol tartrate (LOPRESSOR) 25 MG tablet Take 25 mg by mouth 2 (two) times daily.    Yes Historical Provider, MD  mirtazapine (REMERON) 30 MG tablet Take 30 mg by mouth at bedtime.   Yes Historical Provider, MD  OXYGEN-HELIUM IN Inhale into the lungs as needed. 2 liters   Yes Historical Provider, MD  tiotropium (SPIRIVA HANDIHALER) 18 MCG inhalation capsule Place 1 capsule (18 mcg total) into inhaler and inhale daily. 06/13/14  Yes Renae FickleMackenzie Short, MD  amoxicillin-clavulanate (AUGMENTIN) 875-125 MG per tablet Take 1 tablet by mouth 2 (two) times daily. Patient not taking: Reported on 11/25/2014 06/13/14   Renae FickleMackenzie Short, MD  lactose free nutrition (BOOST PLUS) LIQD Take 237 mLs by mouth 3 (three) times daily with meals. Patient not taking: Reported on 11/25/2014 06/13/14   Renae FickleMackenzie Short, MD  nitroGLYCERIN (NITROSTAT) 0.4  MG SL tablet Place 1 tablet (0.4 mg total) under the tongue every 5 (five) minutes as needed for chest pain. Patient not taking: Reported on 11/25/2014 06/13/14   Renae FickleMackenzie Short, MD  pantoprazole (PROTONIX) 40 MG tablet Take 40 mg by mouth 2 (two) times daily.    Historical Provider, MD  pantoprazole (PROTONIX) 40 MG tablet TAKE 1 TABLET BY MOUTH TWICE A DAY Patient not taking: Reported on 11/25/2014 06/26/14   Runell GessJonathan J Berry, MD  predniSONE (DELTASONE) 20 MG tablet Take 3 tabs daily x 2 days, 2 tabs daily x 2 days, 1 tab daily x 2 days, half tab daily x 2 days, then stop Patient not taking: Reported on 11/25/2014 06/13/14   Renae FickleMackenzie Short, MD   Physical Exam: Filed Vitals:   11/25/14 1458 11/25/14 1500 11/25/14 1530 11/25/14 1600  BP: 130/64 126/68 140/56 147/64  Pulse: 58 58 63 59  Temp:      TempSrc:  Resp:  18 25   SpO2: 100% 100% 100% 100%     General:  No apparent distress  Eyes: PERRL, EOMI, no scleral icterus  ENT: moist oropharynx  Neck: supple, no lymphadenopathy  Cardiovascular: regular rate without MRG; 2+ peripheral pulses, no JVD, no peripheral edema  Respiratory: overall decreased breath sounds, no wheezing   Abdomen: soft, non tender to palpation, positive bowel sounds, no guarding, no rebound  Skin: no rashes  Musculoskeletal: normal bulk and tone, no joint swelling  Psychiatric: normal mood and affect  Neurologic:  nonfocal   Labs on Admission:  Basic Metabolic Panel:  Recent Labs Lab 11/25/14 1351  NA 142  K 4.5  CL 110  CO2 24  GLUCOSE 64*  BUN 39*  CREATININE 2.33*  CALCIUM 8.6   Liver Function Tests:  Recent Labs Lab 11/25/14 1351  AST 18  ALT 13  ALKPHOS 60  BILITOT 0.3  PROT 5.6*  ALBUMIN 3.0*    Recent Labs Lab 11/25/14 1351  LIPASE 36   No results for input(s): AMMONIA in the last 168 hours. CBC:  Recent Labs Lab 11/25/14 1351  WBC 5.9  NEUTROABS 3.9  HGB 10.8*  HCT 34.6*  MCV 98.3  PLT 226   Cardiac  Enzymes:  Recent Labs Lab 11/25/14 1351  TROPONINI <0.03    BNP (last 3 results) No results for input(s): BNP in the last 8760 hours.  ProBNP (last 3 results)  Recent Labs  06/10/14 1156  PROBNP 398.9    CBG: No results for input(s): GLUCAP in the last 168 hours.  Radiological Exams on Admission: Dg Chest 2 View  11/25/2014   CLINICAL DATA:  Intermittent chest pain for a few weeks with recent worsening.  EXAM: CHEST  2 VIEW  COMPARISON:  06/13/2014 and 11/07/2013  FINDINGS: Lungs are hyperexpanded without focal consolidation or effusion. There is flattening hemidiaphragms with diffuse emphysematous disease. Cardiomediastinal silhouette is within normal. There is calcified plaque over the thoracic aorta. There are mild degenerative changes of the spine.  IMPRESSION: No acute cardiopulmonary disease.  COPD.   Electronically Signed   By: Elberta Fortis M.D.   On: 11/25/2014 14:44    EKG: Independently reviewed.  Assessment/Plan Active Problems:   COPD (chronic obstructive pulmonary disease)   CAD (coronary artery disease)   HTN (hypertension)   Chest pain   Malnutrition of moderate degree   Chest pain - patient will be admitted on telemetry, we'll cycle cardiac enzymes overnight. EDP discussed case and reviewed EKG with on-call cardiology. Patient has been chest pain-free in the ED and currently is chest pain-free.   COPD - continue home medications, has no wheezing, no evidence of acute exacerbation   Coronary artery disease - resume home medications  GERD - resume home medications  Acute on chronic renal failure - patient Baseline creatinine is 1.2-1.4, his creatinine currently is 2.3, he has been taking NSAIDs at home, we'll discontinue diclofenac. Appears slightly dehydrated, provide IV fluids overnight, recheck BMP in the morning   Diet: heart healthy Fluids: NS DVT Prophylaxis: heparin  Code Status: Full  Family Communication: d/w patient  Disposition Plan:  tele/obs   Costin M. Elvera Lennox, MD [facility] Pager [phone]  If 7PM-7AM, please contact night-coverage [email or web address] Password [email or web address] 11/25/2014, 5:25 PM

## 2014-11-25 NOTE — ED Notes (Signed)
Report given to [place] Station[place] at bedside.  Belongings at bedside.

## 2014-11-25 NOTE — ED Notes (Signed)
Pt from home via [facility] with c/o generalized crushing chest pain starting yesterday around noon.  Pt reports it's worse with deep breath and cough, which is productive clear.  Pt on 3L nasal cannula at home, hx COPD, CHF, stent placement.  Lungs clear, given 324 mg aspirin, 1 nitro.  Pt in NAD, A&O.

## 2014-11-25 NOTE — ED Notes (Signed)
Left message with pt daughter Mrs Andrey CampanileWilson that pt was being admitted and to return call to [address].

## 2014-11-25 NOTE — ED Provider Notes (Signed)
CSN: [id number]     Arrival date & time 11/25/14  1324 History   First MD Initiated Contact with Patient 11/25/14 1328     Chief Complaint  Patient presents with  . Chest Pain     (Consider location/radiation/quality/duration/timing/severity/associated sxs/prior Treatment) HPI Comments: Patient presents via EMS with chest pain and shortness of breath that onset yesterday. He describes a crushing sensation in the central chest that comes and goes lasting a few minutes at a time. Nothing makes it better and nothing makes it worse. It does not radiate. Associated with shortness of breath and nausea. He had increase his home oxygen from 2-4 L. He denies any cough or fever. Denies any increase in leg pain or swelling. Patient denies any history of CAD. He does have peripheral vascular disease. Record review shows nonobstructive disease by catheterization in 1999. He denies any abdominal pain or back pain.  The history is provided by the patient and the EMS personnel. The history is limited by the condition of the patient.    Past Medical History  Diagnosis Date  . COPD (chronic obstructive pulmonary disease)   . CAD (coronary artery disease)     a.  nonobstructive CAD by cath 1999. b. Myoview 04/2012: fixed inferior and inferoseptal bowel attenuation artifact, no reversible ischemia. EF 62%. Low risk scan.  Marland Kitchen. HTN (hypertension)   . PUD (peptic ulcer disease)     a. s/p surgery 1976.  Marland Kitchen. Syncope     2012  . Carotid artery disease 03/22/13    a. 50-69% BICA by duplex 03/2013, repeat needed 11/2013.  Marland Kitchen. PAD (peripheral artery disease)     a. prior stenting of RCIA 1999. b. history of 90% vertebral artery narrowing, 40% subclavian artery narrowing. c. Diagnostic PV angio 08/2013 for progressive claudication - will ultimately need endarterectomy/patch angio on bilat CFA stenosis; will also need atherectomy/PT/stenting to REIA.   . CKD (chronic kidney disease), stage III   . HLD (hyperlipidemia)   .  Chronic respiratory failure     a. On home O2.  Marland Kitchen. NSVT (nonsustained ventricular tachycardia)     a. Brief NSVT (6b) during 08/2013 adm.  . Stroke   . Shortness of breath   . On supplemental oxygen therapy     2l   Past Surgical History  Procedure Laterality Date  . Cataract extraction    . Gastrectomy    . Transurethral resection of prostate    . Coronary angioplasty with stent placement      stent in 2003; history of right common iliac artery stent hx  . Endarterectomy femoral Bilateral 11/11/2013    Procedure: BILATERAL FEMORAL ENDARTERECTOMIES WITH BILATERAL PATCH ANGIOPLASTIES;  Surgeon: Nada LibmanVance W Brabham, MD;  [facility];  Service: Vascular;  Laterality: Bilateral;  . Lower extremity angiogram N/A 09/01/2013    Procedure: LOWER EXTREMITY ANGIOGRAM;  Surgeon: Runell GessJonathan J Berry, MD;  [facility];  Service: Cardiovascular;  Laterality: N/A;   Family History  Problem Relation Age of Onset  . Hypertension    . Coronary artery disease    . Stroke Mother   . Heart disease Mother   . Hypertension Mother   . Heart attack Mother   . Heart attack Brother   . Heart disease Brother   . Heart disease Father   . Hypertension Father   . Heart attack Father    History  Substance Use Topics  . Smoking status: Former Smoker -- 1.00 packs/day for 60 years  Types: Cigarettes    Quit date: 01/06/1997  . Smokeless tobacco: Never Used  . Alcohol Use: No     Comment: hx etoh abuse in past, daughter states quit over 20 years ago    Review of Systems  Constitutional: Negative for fever, activity change and appetite change.  HENT: Negative for congestion and rhinorrhea.   Eyes: Negative for visual disturbance.  Respiratory: Positive for chest tightness and shortness of breath. Negative for cough.   Cardiovascular: Positive for chest pain.  Gastrointestinal: Negative for nausea, vomiting and abdominal pain.  Genitourinary: Negative for dysuria, urgency, hematuria and decreased  urine volume.  Musculoskeletal: Negative for myalgias and arthralgias.  Skin: Negative for rash.  Neurological: Negative for dizziness, weakness and headaches.  A complete 10 system review of systems was obtained and all systems are negative except as noted in the HPI and PMH.      Allergies  Review of patient's allergies indicates no known allergies.  Home Medications   Prior to Admission medications   Medication Sig Start Date End Date Taking? Authorizing Provider  albuterol (PROVENTIL HFA;VENTOLIN HFA) 108 (90 BASE) MCG/ACT inhaler Inhale 2 puffs into the lungs every 4 (four) hours as needed for wheezing or shortness of breath. 06/13/14  Yes Renae Fickle, MD  aspirin EC 81 MG tablet Take 81 mg by mouth at bedtime.   Yes Historical Provider, MD  budesonide-formoterol (SYMBICORT) 160-4.5 MCG/ACT inhaler Inhale 2 puffs into the lungs 2 (two) times daily.    Yes Historical Provider, MD  diazepam (VALIUM) 5 MG tablet Take 5 mg by mouth every 8 (eight) hours as needed (sleep).   Yes Historical Provider, MD  diclofenac (VOLTAREN) 75 MG EC tablet Take 75 mg by mouth 2 (two) times daily.   Yes Historical Provider, MD  metoprolol tartrate (LOPRESSOR) 25 MG tablet Take 25 mg by mouth 2 (two) times daily.    Yes Historical Provider, MD  mirtazapine (REMERON) 30 MG tablet Take 30 mg by mouth at bedtime.   Yes Historical Provider, MD  OXYGEN-HELIUM IN Inhale into the lungs as needed. 2 liters   Yes Historical Provider, MD  tiotropium (SPIRIVA HANDIHALER) 18 MCG inhalation capsule Place 1 capsule (18 mcg total) into inhaler and inhale daily. 06/13/14  Yes Renae Fickle, MD  amoxicillin-clavulanate (AUGMENTIN) 875-125 MG per tablet Take 1 tablet by mouth 2 (two) times daily. Patient not taking: Reported on 11/25/2014 06/13/14   Renae Fickle, MD  lactose free nutrition (BOOST PLUS) LIQD Take 237 mLs by mouth 3 (three) times daily with meals. Patient not taking: Reported on 11/25/2014 06/13/14    Renae Fickle, MD  nitroGLYCERIN (NITROSTAT) 0.4 MG SL tablet Place 1 tablet (0.4 mg total) under the tongue every 5 (five) minutes as needed for chest pain. Patient not taking: Reported on 11/25/2014 06/13/14   Renae Fickle, MD  pantoprazole (PROTONIX) 40 MG tablet Take 40 mg by mouth 2 (two) times daily.    Historical Provider, MD  pantoprazole (PROTONIX) 40 MG tablet TAKE 1 TABLET BY MOUTH TWICE A DAY Patient not taking: Reported on 11/25/2014 06/26/14   Runell Gess, MD  predniSONE (DELTASONE) 20 MG tablet Take 3 tabs daily x 2 days, 2 tabs daily x 2 days, 1 tab daily x 2 days, half tab daily x 2 days, then stop Patient not taking: Reported on 11/25/2014 06/13/14   Renae Fickle, MD   BP 148/54 mmHg  Pulse 59  Temp(Src) 97.9 F (36.6 C) (Oral)  Resp 18  SpO2  100% Physical Exam  Constitutional: He is oriented to person, place, and time. He appears well-developed and well-nourished. No distress.  HENT:  Head: Normocephalic and atraumatic.  Mouth/Throat: Oropharynx is clear and moist. No oropharyngeal exudate.  Eyes: Conjunctivae and EOM are normal. Pupils are equal, round, and reactive to light.  Neck: Normal range of motion. Neck supple.  No meningismus.  Cardiovascular: Normal rate, regular rhythm, normal heart sounds and intact distal pulses.   No murmur heard. Pulmonary/Chest: Effort normal and breath sounds normal. No respiratory distress. He exhibits no tenderness.  Decreased breath sounds bilaterally No wheezing  Abdominal: Soft. There is no tenderness. There is no rebound and no guarding.  Musculoskeletal: Normal range of motion. He exhibits no edema or tenderness.  Neurological: He is alert and oriented to person, place, and time. No cranial nerve deficit. He exhibits normal muscle tone. Coordination normal.  No ataxia on finger to nose bilaterally. No pronator drift. 5/5 strength throughout. CN 2-12 intact. Negative Romberg. Equal grip strength. Sensation intact. Gait  is normal.   Skin: Skin is warm.  Psychiatric: He has a normal mood and affect. His behavior is normal.  Nursing note and vitals reviewed.   ED Course  Procedures (including critical care time) Labs Review Labs Reviewed  CBC WITH DIFFERENTIAL/PLATELET - Abnormal; Notable for the following:    RBC 3.52 (*)    Hemoglobin 10.8 (*)    HCT 34.6 (*)    All other components within normal limits  COMPREHENSIVE METABOLIC PANEL - Abnormal; Notable for the following:    Glucose, Bld 64 (*)    BUN 39 (*)    Creatinine, Ser 2.33 (*)    Total Protein 5.6 (*)    Albumin 3.0 (*)    GFR calc non Af Amer 24 (*)    GFR calc Af Amer 28 (*)    All other components within normal limits  MRSA PCR SCREENING  LIPASE, BLOOD  TROPONIN I  URINALYSIS, ROUTINE W REFLEX MICROSCOPIC  TROPONIN I  TROPONIN I  TROPONIN I  BASIC METABOLIC PANEL  CBC    Imaging Review Dg Chest 2 View  11/25/2014   CLINICAL DATA:  Intermittent chest pain for a few weeks with recent worsening.  EXAM: CHEST  2 VIEW  COMPARISON:  06/13/2014 and 11/07/2013  FINDINGS: Lungs are hyperexpanded without focal consolidation or effusion. There is flattening hemidiaphragms with diffuse emphysematous disease. Cardiomediastinal silhouette is within normal. There is calcified plaque over the thoracic aorta. There are mild degenerative changes of the spine.  IMPRESSION: No acute cardiopulmonary disease.  COPD.   Electronically Signed   By: Elberta Fortis M.D.   On: 11/25/2014 14:44     EKG Interpretation None      MDM   Final diagnoses:  Chest pain, unspecified chest pain type  Acute kidney injury  Chest pain and shortness of breath since yesterday, worse with coughing and deep breathing.  EKG shows stable ST depressions laterally. Septal ST depressions have improved. Troponin negative, CXR negative.  Creatinine elevated from baseline.  Start IVF gently for dehydration. EKG reviewed with Dr. Shirlee Latch who feels patient can be ruled out  on hospitalist service and he will consult as needed D.w Dr. Elvera Lennox.  Chest pain has resolved in the ED.  ED ECG REPORT   Date: 11/25/2014  Rate: 57  Rhythm: normal sinus rhythm  QRS Axis: normal  Intervals: normal  ST/T Wave abnormalities: ST depressions laterally  Conduction Disutrbances:none  Narrative Interpretation:   Old  EKG Reviewed: changes noted  I have personally reviewed the EKG tracing and agree with the computerized printout as noted.     Glynn Octave, MD 11/25/14 [id number]

## 2014-11-26 ENCOUNTER — Observation Stay (HOSPITAL_COMMUNITY): Payer: Medicare Other

## 2014-11-26 DIAGNOSIS — R0789 Other chest pain: Secondary | ICD-10-CM | POA: Diagnosis not present

## 2014-11-26 DIAGNOSIS — J449 Chronic obstructive pulmonary disease, unspecified: Secondary | ICD-10-CM | POA: Diagnosis not present

## 2014-11-26 DIAGNOSIS — R079 Chest pain, unspecified: Secondary | ICD-10-CM | POA: Diagnosis not present

## 2014-11-26 DIAGNOSIS — I10 Essential (primary) hypertension: Secondary | ICD-10-CM | POA: Diagnosis not present

## 2014-11-26 DIAGNOSIS — I739 Peripheral vascular disease, unspecified: Secondary | ICD-10-CM | POA: Diagnosis not present

## 2014-11-26 DIAGNOSIS — J441 Chronic obstructive pulmonary disease with (acute) exacerbation: Secondary | ICD-10-CM | POA: Diagnosis not present

## 2014-11-26 DIAGNOSIS — N179 Acute kidney failure, unspecified: Secondary | ICD-10-CM | POA: Diagnosis not present

## 2014-11-26 DIAGNOSIS — I2510 Atherosclerotic heart disease of native coronary artery without angina pectoris: Secondary | ICD-10-CM | POA: Diagnosis not present

## 2014-11-26 LAB — BASIC METABOLIC PANEL
ANION GAP: 14 (ref 5–15)
BUN: 32 mg/dL — AB (ref 6–23)
CO2: 21 mmol/L (ref 19–32)
Calcium: 8.5 mg/dL (ref 8.4–10.5)
Chloride: 110 mmol/L (ref 96–112)
Creatinine, Ser: 1.52 mg/dL — ABNORMAL HIGH (ref 0.50–1.35)
GFR calc Af Amer: 46 mL/min — ABNORMAL LOW (ref >90)
GFR calc non Af Amer: 40 mL/min — ABNORMAL LOW (ref >90)
Glucose, Bld: 107 mg/dL — ABNORMAL HIGH (ref 70–99)
POTASSIUM: 4.2 mmol/L (ref 3.5–5.1)
SODIUM: 145 mmol/L (ref 135–145)

## 2014-11-26 LAB — CBC
HCT: 30.1 % — ABNORMAL LOW (ref 39.0–52.0)
HEMOGLOBIN: 9.4 g/dL — AB (ref 13.0–17.0)
MCH: 30.6 pg (ref 26.0–34.0)
MCHC: 31.2 g/dL (ref 30.0–36.0)
MCV: 98 fL (ref 78.0–100.0)
PLATELETS: 181 10*3/uL (ref 150–400)
RBC: 3.07 MIL/uL — AB (ref 4.22–5.81)
RDW: 15.4 % (ref 11.5–15.5)
WBC: 3.7 10*3/uL — AB (ref 4.0–10.5)

## 2014-11-26 LAB — TROPONIN I: Troponin I: <0.03 ng/mL (ref <0.031)

## 2014-11-26 MED ORDER — REGADENOSON 0.4 MG/5ML IV SOLN
0.4000 mg | Freq: Once | INTRAVENOUS | Status: AC
  Administered 2014-11-26: 0.4 mg via INTRAVENOUS
  Filled 2014-11-26: qty 5

## 2014-11-26 MED ORDER — METOPROLOL TARTRATE 12.5 MG HALF TABLET
12.5000 mg | ORAL_TABLET | Freq: Two times a day (BID) | ORAL | Status: DC
  Administered 2014-11-26 – 2014-11-27 (×2): 12.5 mg via ORAL
  Filled 2014-11-26 (×2): qty 1

## 2014-11-26 MED ORDER — TECHNETIUM TC 99M SESTAMIBI GENERIC - CARDIOLITE
10.0000 | Freq: Once | INTRAVENOUS | Status: AC | PRN
  Administered 2014-11-26: 10 via INTRAVENOUS

## 2014-11-26 MED ORDER — TECHNETIUM TC 99M SESTAMIBI - CARDIOLITE
30.0000 | Freq: Once | INTRAVENOUS | Status: AC | PRN
Start: 2014-11-26 — End: 2014-11-26
  Administered 2014-11-26: 30 via INTRAVENOUS

## 2014-11-26 MED ORDER — SODIUM CHLORIDE 0.9 % IV SOLN
INTRAVENOUS | Status: AC
  Administered 2014-11-26: 75 mL/h via INTRAVENOUS
  Administered 2014-11-27: 03:00:00 via INTRAVENOUS

## 2014-11-26 MED ORDER — REGADENOSON 0.4 MG/5ML IV SOLN
INTRAVENOUS | Status: AC
  Filled 2014-11-26: qty 5

## 2014-11-26 MED ORDER — REGADENOSON 0.4 MG/5ML IV SOLN
0.4000 mg | Freq: Once | INTRAVENOUS | Status: DC
  Filled 2014-11-26: qty 5

## 2014-11-26 NOTE — Consult Note (Signed)
CARDIOLOGY CONSULT NOTE       Patient ID: Alec Walker MRN: [id number] DOB/AGE: 21-Feb-1927 81 y.o.  Admit date: 11/25/2014 Referring Physician:  Janee Mornhompson Primary Physician: Eartha InchBADGER,MICHAEL C, MD Primary Cardiologist:  Allyson SabalBerry Reason for Consultation:  Chest Pain  Active Problems:   COPD (chronic obstructive pulmonary disease)   CAD (coronary artery disease)   HTN (hypertension)   Chest pain   Malnutrition of moderate degree   HPI:   81 yo vasculopath admitted with 2 days of SSCP.  Pain free since in ER and this am  SSCP wax/wane not exertional No associated dyspnea, pleurisy, diaphoresis .  In general does well for his age and does not get regular chest pain.  Last cath 1999 with non obstructive disease  Last myovue 10/17/13   low risk fixed inferior defect ? Attenuation artifact.  Has significant vascular disease followed by Dr Allyson SabalBerry an Myra GianottiBrabham.  Moderate carotid disease by duplex 2014  PVD with bilateral femoral arterectomy March 2015 after low risk myovue.  Ambulation limited more by back pain now.  Some COPD  Previous smoker.  No complaints this am  Enzymes negative to date  Has CRF with Cr elevated on admission 2.3   ROS All other systems reviewed and negative except as noted above  Past Medical History  Diagnosis Date  . COPD (chronic obstructive pulmonary disease)   . CAD (coronary artery disease)     a.  nonobstructive CAD by cath 1999. b. Myoview 04/2012: fixed inferior and inferoseptal bowel attenuation artifact, no reversible ischemia. EF 62%. Low risk scan.  Marland Kitchen. HTN (hypertension)   . PUD (peptic ulcer disease)     a. s/p surgery 1976.  Marland Kitchen. Syncope     2012  . Carotid artery disease 03/22/13    a. 50-69% BICA by duplex 03/2013, repeat needed 11/2013.  Marland Kitchen. PAD (peripheral artery disease)     a. prior stenting of RCIA 1999. b. history of 90% vertebral artery narrowing, 40% subclavian artery narrowing. c. Diagnostic PV angio 08/2013 for progressive claudication - will ultimately  need endarterectomy/patch angio on bilat CFA stenosis; will also need atherectomy/PT/stenting to REIA.   . CKD (chronic kidney disease), stage III   . HLD (hyperlipidemia)   . Chronic respiratory failure     a. On home O2.  Marland Kitchen. NSVT (nonsustained ventricular tachycardia)     a. Brief NSVT (6b) during 08/2013 adm.  . Stroke   . Shortness of breath   . On supplemental oxygen therapy     2l    Family History  Problem Relation Age of Onset  . Hypertension    . Coronary artery disease    . Stroke Mother   . Heart disease Mother   . Hypertension Mother   . Heart attack Mother   . Heart attack Brother   . Heart disease Brother   . Heart disease Father   . Hypertension Father   . Heart attack Father     History   Social History  . Marital Status: Married    Spouse Name: N/A  . Number of Children: 3  . Years of Education: N/A   Occupational History  . retired     Social History Main Topics  . Smoking status: Former Smoker -- 1.00 packs/day for 60 years    Types: Cigarettes    Quit date: 01/06/1997  . Smokeless tobacco: Never Used  . Alcohol Use: No     Comment: hx etoh abuse in past, daughter states quit  over 20 years ago  . Drug Use: No  . Sexual Activity: No   Other Topics Concern  . Not on file   Social History Narrative   Lives with his wife who is disabled.  He does not use any assist device.  He mows grass, cooks, etc and cares for her.      Past Surgical History  Procedure Laterality Date  . Cataract extraction    . Gastrectomy    . Transurethral resection of prostate    . Coronary angioplasty with stent placement      stent in 2003; history of right common iliac artery stent hx  . Endarterectomy femoral Bilateral 11/11/2013    Procedure: BILATERAL FEMORAL ENDARTERECTOMIES WITH BILATERAL PATCH ANGIOPLASTIES;  Surgeon: Nada Libman, MD;  Location: [facility] OR;  Service: Vascular;  Laterality: Bilateral;  . Lower extremity angiogram N/A 09/01/2013    Procedure:  LOWER EXTREMITY ANGIOGRAM;  Surgeon: Runell Gess, MD;  Location: [facility] CATH LAB;  Service: Cardiovascular;  Laterality: N/A;     . aspirin EC  81 mg Oral QHS  . budesonide-formoterol  2 puff Inhalation BID  . Chlorhexidine Gluconate Cloth  6 each Topical Q0600  . heparin subcutaneous  5,000 Units Subcutaneous 3 times per day  . metoprolol tartrate  25 mg Oral BID  . mirtazapine  30 mg Oral QHS  . mupirocin ointment  1 application Nasal BID  . pantoprazole  40 mg Oral BID  . regadenoson  0.4 mg Intravenous Once  . tiotropium  18 mcg Inhalation Daily   . sodium chloride      Physical Exam: Blood pressure 139/49, pulse 64, temperature 98.9 F (37.2 C), temperature source Oral, resp. rate 20, height  (1.778 m), weight 114 lb 9.8 oz (51.988 kg), SpO2 100 %.   Affect appropriate Elderly thin male  HEENT: normal Neck supple with no adenopathy JVP normal bilateral  bruits no thyromegaly Lungs clear with no wheezing and good diaphragmatic motion Heart:  S1/S2 SEM  murmur, no rub, gallop or click PMI normal Abdomen: benighn, BS positve, no tenderness, Mid line scar from PUD surgery no bruit.  No HSM or HJR Bilateral femoral endarderectomy scars with bruits  Decreased PT bilaterally  No edema Neuro non-focal Skin warm and dry No muscular weakness   Labs:   Lab Results  Component Value Date   WBC 3.7* 11/26/2014   HGB 9.4* 11/26/2014   HCT 30.1* 11/26/2014   MCV 98.0 11/26/2014   PLT 181 11/26/2014     Recent Labs Lab 11/25/14 1351 11/26/14 0600  NA 142 145  K 4.5 4.2  CL 110 110  CO2 24 21  BUN 39* 32*  CREATININE 2.33* 1.52*  CALCIUM 8.6 8.5  PROT 5.6*  --   BILITOT 0.3  --   ALKPHOS 60  --   ALT 13  --   AST 18  --   GLUCOSE 64* 107*   Lab Results  Component Value Date   CKTOTAL 7231* 07/26/2011   CKMB 13.5* 07/26/2011   TROPONINI <0.03 11/26/2014    Lab Results  Component Value Date   CHOL 160 06/17/2013   CHOL 164 07/27/2011   Lab Results   Component Value Date   HDL 65 06/17/2013   HDL 46 07/27/2011   Lab Results  Component Value Date   LDLCALC 65 06/17/2013   LDLCALC 96 07/27/2011   Lab Results  Component Value Date   TRIG 149 06/17/2013   TRIG 109  07/27/2011   Lab Results  Component Value Date   CHOLHDL 2.5 06/17/2013   CHOLHDL 3.6 07/27/2011   No results found for: LDLDIRECT    Radiology: Dg Chest 2 View  11/25/2014   CLINICAL DATA:  Intermittent chest pain for a few weeks with recent worsening.  EXAM: CHEST  2 VIEW  COMPARISON:  06/13/2014 and 11/07/2013  FINDINGS: Lungs are hyperexpanded without focal consolidation or effusion. There is flattening hemidiaphragms with diffuse emphysematous disease. Cardiomediastinal silhouette is within normal. There is calcified plaque over the thoracic aorta. There are mild degenerative changes of the spine.  IMPRESSION: No acute cardiopulmonary disease.  COPD.   Electronically Signed   By: Elberta Fortis M.D.   On: 11/25/2014 14:44    EKG:  SR rate 56  Scooped inferolateral T waves similar to 2015  No acute ST elevation   ASSESSMENT AND PLAN:  Chest Pain:  Given age and elevated Cr would like to avoid cath.  R/O  Pain free since admission.  Lexiscan myovue ordered for this am and confirmed can be done by nuclear Continue aspirin and beta blocker  PVD:  Stable with no rest pain Needs f/u carotids and ABI's  Can by done as outpatient at Dr Allyson Sabal or Brabham's office CRF:  Cr improved since admission  1.52 not prohibitive of cath if needed   Signed: Charlton Haws 11/26/2014, 8:27 AM

## 2014-11-26 NOTE — Progress Notes (Signed)
[facility] PROGRESS NOTE  Cheryle HorsfallJohnie E Silverio MVH:[id number] DOB: [birth year] DOA: 11/25/2014 PCP: Eartha InchBADGER,MICHAEL C, MD  Assessment/Plan: #1 chest pain Questionable etiology. Patient with multiple risk factors. Clinical improvement. Cardiac enzymes negative 3. 2-D echo from 06/11/2014 with a EF of 55-60%. Moderate focal basal septal hypertrophy. Moderately dilated left atrium. Patient underwent Lexis scan Myoview study which showed a fixed defect involving the inferior wall. Mild nonspecific septal hypokinesis. EF 50%. Low risk stress test. Continue aspirin, Lopressor. Cardiology following and appreciate input and recommendations.  #2 acute on chronic renal failure Baseline creatinine 1.2-1.4. Creatinine on admission was 2.3. Patient had been on NSAIDs which have been discontinued. Patient looked volume depleted. Patient was hydrated with IV fluids with improvement in renal function which is currently at 1.5. Follow.  #3 gastroesophageal reflux disease PPI.  #4 COPD Stable. Continue Spiriva and Symbicort.  #5 coronary artery disease Stable. Continue aspirin and Lopressor.  #6 peripheral vascular disease Outpatient follow-up with vascular surgery and cardiology Dr. Allyson SabalBerry.  #7 prophylaxis PPI for GI prophylaxis. Heparin for DVT prophylaxis.  Code Status: Full Family Communication: Updated patient. No family at bedside. Disposition Plan: Remain inpatient.   Consultants:  Cardiology: Dr. Eden EmmsNishan 11/26/2014  Procedures:  Chest x-ray 11/25/2014  Myoview stress test 11/26/2014    Antibiotics:  None  HPI/Subjective: Patient denies any current chest pain. Patient denies any shortness of breath.  Objective: Filed Vitals:   11/26/14 1311  BP: 152/61  Pulse: 68  Temp:   Resp:     Intake/Output Summary (Last 24 hours) at 11/26/14 1641 Last data filed at 11/26/14 0600  Gross per 24 hour  Intake   1185 ml  Output   1025 ml  Net    160 ml   Filed Weights    11/25/14 2000 11/26/14 0000 11/26/14 0400  Weight: 51.71 kg (114 lb) 51.988 kg (114 lb 9.8 oz) 51.988 kg (114 lb 9.8 oz)    Exam:   General:  NAD  Cardiovascular: RRR  Respiratory: CTAB  Abdomen: Soft, nontender, nondistended, positive bowel sounds.  Musculoskeletal: No clubbing cyanosis or edema.  Data Reviewed: Basic Metabolic Panel:  Recent Labs Lab 11/25/14 1351 11/26/14 0600  NA 142 145  K 4.5 4.2  CL 110 110  CO2 24 21  GLUCOSE 64* 107*  BUN 39* 32*  CREATININE 2.33* 1.52*  CALCIUM 8.6 8.5   Liver Function Tests:  Recent Labs Lab 11/25/14 1351  AST 18  ALT 13  ALKPHOS 60  BILITOT 0.3  PROT 5.6*  ALBUMIN 3.0*    Recent Labs Lab 11/25/14 1351  LIPASE 36   No results for input(s): AMMONIA in the last 168 hours. CBC:  Recent Labs Lab 11/25/14 1351 11/26/14 0600  WBC 5.9 3.7*  NEUTROABS 3.9  --   HGB 10.8* 9.4*  HCT 34.6* 30.1*  MCV 98.3 98.0  PLT 226 181   Cardiac Enzymes:  Recent Labs Lab 11/25/14 1351 11/25/14 1830 11/25/14 2155 11/26/14 0006  TROPONINI <0.03 <0.03 <0.03 <0.03   BNP (last 3 results) No results for input(s): BNP in the last 8760 hours.  ProBNP (last 3 results)  Recent Labs  06/10/14 1156  PROBNP 398.9    CBG: No results for input(s): GLUCAP in the last 168 hours.  Recent Results (from the past 240 hour(s))  MRSA PCR Screening     Status: Abnormal   Collection Time: 11/25/14  7:02 PM  Result Value Ref Range Status   MRSA by PCR POSITIVE (A) NEGATIVE Final  Comment:        The GeneXpert MRSA Assay (FDA approved for NASAL specimens only), is one component of a comprehensive MRSA colonization surveillance program. It is not intended to diagnose MRSA infection nor to guide or monitor treatment for MRSA infections. RESULT CALLED TO, READ BACK BY AND VERIFIED WITH: [facility] RN 1610 11/25/14 MITCHELL,L      Studies: Dg Chest 2 View  11/25/2014   CLINICAL DATA:  Intermittent chest pain for a few  weeks with recent worsening.  EXAM: CHEST  2 VIEW  COMPARISON:  06/13/2014 and 11/07/2013  FINDINGS: Lungs are hyperexpanded without focal consolidation or effusion. There is flattening hemidiaphragms with diffuse emphysematous disease. Cardiomediastinal silhouette is within normal. There is calcified plaque over the thoracic aorta. There are mild degenerative changes of the spine.  IMPRESSION: No acute cardiopulmonary disease.  COPD.   Electronically Signed   By: Elberta Fortis M.D.   On: 11/25/2014 14:44   Nm Myocar Multi W/spect W/wall Motion / Ef  11/26/2014   CLINICAL DATA:  Chest pain  EXAM: MYOCARDIAL IMAGING WITH SPECT (REST AND PHARMACOLOGIC-STRESS)  GATED LEFT VENTRICULAR WALL MOTION STUDY  LEFT VENTRICULAR EJECTION FRACTION  TECHNIQUE: Standard myocardial SPECT imaging was performed after resting intravenous injection of 10 mCi Tc-37m sestamibi. Subsequently, intravenous infusion of Lexiscan was performed under the supervision of the Cardiology staff. At peak effect of the drug, 30 mCi Tc-73m sestamibi was injected intravenously and standard myocardial SPECT imaging was performed. Quantitative gated imaging was also performed to evaluate left ventricular wall motion, and estimate left ventricular ejection fraction.  COMPARISON:  None.  FINDINGS: Perfusion: There is a large fixed perfusion defect involving the inferior wall. No stress-induced perfusion defect. There is some fixed thinning at the apex.  Wall Motion: Mild nonspecific septal hypokinesis. Relative normal motion of the inferior wall despite the defect.  Left Ventricular Ejection Fraction: 50 %  End diastolic volume 61 ml  End systolic volume 30 ml  IMPRESSION: 1. Fixed defect involving the inferior wall.  2. Mild nonspecific septal hypokinesis.  3. Left ventricular ejection fraction 50%  4. Low-risk stress test findings*.  *2012 Appropriate Use Criteria for Coronary Revascularization Focused Update: J Am Coll Cardiol. 2012;59(9):857-881.  http://content.dementiazones.com.aspx?articleid=1201161   Electronically Signed   By: Jolaine Click M.D.   On: 11/26/2014 14:11    Scheduled Meds: . aspirin EC  81 mg Oral QHS  . budesonide-formoterol  2 puff Inhalation BID  . Chlorhexidine Gluconate Cloth  6 each Topical Q0600  . heparin subcutaneous  5,000 Units Subcutaneous 3 times per day  . metoprolol tartrate  12.5 mg Oral BID  . mirtazapine  30 mg Oral QHS  . mupirocin ointment  1 application Nasal BID  . pantoprazole  40 mg Oral BID  . regadenoson      . tiotropium  18 mcg Inhalation Daily   Continuous Infusions: . sodium chloride 75 mL/hr (11/26/14 1310)    Principal Problem:   Chest pain Active Problems:   COPD (chronic obstructive pulmonary disease)   CAD (coronary artery disease)   HTN (hypertension)   Carotid artery disease   PAD (peripheral artery disease)   Malnutrition of moderate degree    Time spent: 40 MINS    [facility] MD [facility] Pager [phone]. If 7PM-7AM, please contact night-coverage at [email or web address], password [facility] 11/26/2014, 4:41 PM

## 2014-11-26 NOTE — Progress Notes (Signed)
UR completed 

## 2014-11-26 NOTE — Progress Notes (Signed)
   Lexiscan Myoview  Lexiscan Injected. ECG:  Baseline inf-lat down-sloping TWI.  No change from baseline during stress. Tolerated well. Images pending. Signed,  Tereso NewcomerScott Zaila Crew, PA-C   11/26/2014 2:38 PM   ADDENDUM: IMPRESSION: 1. Fixed defect involving the inferior wall. 2. Mild nonspecific septal hypokinesis. 3. Left ventricular ejection fraction 50% 4. Low-risk stress test findings*.    Signed,  Tereso NewcomerScott Trajon Rosete, PA-C   11/26/2014 2:40 PM

## 2014-11-27 ENCOUNTER — Other Ambulatory Visit: Payer: Self-pay | Admitting: Cardiology

## 2014-11-27 ENCOUNTER — Observation Stay (HOSPITAL_COMMUNITY): Payer: Medicare Other

## 2014-11-27 DIAGNOSIS — R079 Chest pain, unspecified: Secondary | ICD-10-CM

## 2014-11-27 DIAGNOSIS — R0602 Shortness of breath: Secondary | ICD-10-CM

## 2014-11-27 DIAGNOSIS — I10 Essential (primary) hypertension: Secondary | ICD-10-CM | POA: Diagnosis not present

## 2014-11-27 DIAGNOSIS — N179 Acute kidney failure, unspecified: Secondary | ICD-10-CM | POA: Diagnosis not present

## 2014-11-27 DIAGNOSIS — I6523 Occlusion and stenosis of bilateral carotid arteries: Secondary | ICD-10-CM

## 2014-11-27 DIAGNOSIS — I739 Peripheral vascular disease, unspecified: Secondary | ICD-10-CM

## 2014-11-27 DIAGNOSIS — I25709 Atherosclerosis of coronary artery bypass graft(s), unspecified, with unspecified angina pectoris: Secondary | ICD-10-CM | POA: Diagnosis not present

## 2014-11-27 DIAGNOSIS — E43 Unspecified severe protein-calorie malnutrition: Secondary | ICD-10-CM | POA: Insufficient documentation

## 2014-11-27 LAB — BASIC METABOLIC PANEL
Anion gap: 4 — ABNORMAL LOW (ref 5–15)
BUN: 24 mg/dL — ABNORMAL HIGH (ref 6–23)
CALCIUM: 8.3 mg/dL — AB (ref 8.4–10.5)
CHLORIDE: 116 mmol/L — AB (ref 96–112)
CO2: 25 mmol/L (ref 19–32)
Creatinine, Ser: 1.29 mg/dL (ref 0.50–1.35)
GFR, EST AFRICAN AMERICAN: 57 mL/min — AB (ref >90)
GFR, EST NON AFRICAN AMERICAN: 49 mL/min — AB (ref >90)
Glucose, Bld: 95 mg/dL (ref 70–99)
POTASSIUM: 4.3 mmol/L (ref 3.5–5.1)
Sodium: 145 mmol/L (ref 135–145)

## 2014-11-27 LAB — URINALYSIS, ROUTINE W REFLEX MICROSCOPIC
BILIRUBIN URINE: NEGATIVE
Glucose, UA: NEGATIVE mg/dL
Hgb urine dipstick: NEGATIVE
Ketones, ur: NEGATIVE mg/dL
Leukocytes, UA: NEGATIVE
NITRITE: NEGATIVE
Protein, ur: NEGATIVE mg/dL
SPECIFIC GRAVITY, URINE: 1.007 (ref 1.005–1.030)
UROBILINOGEN UA: 0.2 mg/dL (ref 0.0–1.0)
pH: 5 (ref 5.0–8.0)

## 2014-11-27 LAB — CBC
HEMATOCRIT: 31 % — AB (ref 39.0–52.0)
HEMOGLOBIN: 9.6 g/dL — AB (ref 13.0–17.0)
MCH: 30.6 pg (ref 26.0–34.0)
MCHC: 31 g/dL (ref 30.0–36.0)
MCV: 98.7 fL (ref 78.0–100.0)
Platelets: 183 10*3/uL (ref 150–400)
RBC: 3.14 MIL/uL — ABNORMAL LOW (ref 4.22–5.81)
RDW: 15.8 % — ABNORMAL HIGH (ref 11.5–15.5)
WBC: 4.3 10*3/uL (ref 4.0–10.5)

## 2014-11-27 MED ORDER — METOPROLOL TARTRATE 12.5 MG HALF TABLET
12.5000 mg | ORAL_TABLET | Freq: Every day | ORAL | Status: DC
  Administered 2014-11-28: 12.5 mg via ORAL
  Filled 2014-11-27: qty 1

## 2014-11-27 MED ORDER — ENSURE ENLIVE PO LIQD
237.0000 mL | Freq: Two times a day (BID) | ORAL | Status: DC
  Administered 2014-11-27 – 2014-11-28 (×2): 237 mL via ORAL

## 2014-11-27 MED ORDER — BUDESONIDE-FORMOTEROL FUMARATE 160-4.5 MCG/ACT IN AERO
2.0000 | INHALATION_SPRAY | Freq: Two times a day (BID) | RESPIRATORY_TRACT | Status: DC
  Administered 2014-11-27 – 2014-11-28 (×2): 2 via RESPIRATORY_TRACT

## 2014-11-27 MED ORDER — FUROSEMIDE 10 MG/ML IJ SOLN
40.0000 mg | Freq: Once | INTRAMUSCULAR | Status: AC
  Administered 2014-11-27: 40 mg via INTRAVENOUS
  Filled 2014-11-27: qty 4

## 2014-11-27 NOTE — Progress Notes (Signed)
[facility] PROGRESS NOTE  Alec HorsfallJohnie E Walker [id number] DOB: Jan 16, 1927 DOA: 11/25/2014 PCP: Alec Walker,Alec Walker  Assessment/Plan: #1 chest pain Questionable etiology. Patient with multiple risk factors. Clinical improvement. Cardiac enzymes negative 3. 2-D echo from 06/11/2014 with a EF of 55-60%. Moderate focal basal septal hypertrophy. Moderately dilated left atrium. Patient underwent Lexis scan Myoview study which showed a fixed defect involving the inferior wall. Mild nonspecific septal hypokinesis. EF 50%. Low risk stress test. Continue aspirin, Lopressor. No further workup per cardiology. Outpatient follow up. Cardiology following and appreciate input and recommendations.  #2 acute on chronic renal failure Baseline creatinine 1.2-1.4. Creatinine on admission was 2.3. Patient had been on NSAIDs which have been discontinued. Patient looked volume depleted. Patient was hydrated with IV fluids with improvement in renal function which is currently at 1.29. Follow.  #3 SOB Likely secondary to IVF hydration. CXR. LASIX 40MG  IV X1.  #4 gastroesophageal reflux disease PPI.  #5 COPD Stable. Continue Spiriva and Symbicort.  #6 coronary artery disease Stable. Continue aspirin and Lopressor.  #7 peripheral vascular disease Outpatient follow-up with vascular surgery and cardiology Alec Walker.  #8 prophylaxis PPI for GI prophylaxis. Heparin for DVT prophylaxis.  Code Status: Full Family Communication: Updated patient. Tried calling daughter Alec Walker [phone]), but got answering machine, left message. Disposition Plan: Hopefully home tomorrow.   Consultants:  Cardiology: Alec Walker 11/26/2014  Procedures:  Chest x-ray 11/25/2014, 11/27/14  Myoview stress test 11/26/2014    Antibiotics:  None  HPI/Subjective: Patient denies any current chest pain. Patient states some SOB earlier on this morning which has since improved.  Objective: Filed Vitals:   11/27/14  1005  BP: 116/57  Pulse: 63  Temp:   Resp:     Intake/Output Summary (Last 24 hours) at 11/27/14 1009 Last data filed at 11/27/14 0900  Gross per 24 hour  Intake   1980 ml  Output    800 ml  Net   1180 ml   Filed Weights   11/26/14 0000 11/26/14 0400 11/27/14 0339  Weight: 51.988 kg (114 lb 9.8 oz) 51.988 kg (114 lb 9.8 oz) 52.663 kg (116 lb 1.6 oz)    Exam:   General:  NAD  Cardiovascular: RRR  Respiratory: Some scattered crackles. No wheezing  Abdomen: Soft, nontender, nondistended, positive bowel sounds.  Musculoskeletal: No clubbing cyanosis or edema.  Data Reviewed: Basic Metabolic Panel:  Recent Labs Lab 11/25/14 1351 11/26/14 0600 11/27/14 0535  NA 142 145 145  K 4.5 4.2 4.3  CL 110 110 116*  CO2 24 21 25   GLUCOSE 64* 107* 95  BUN 39* 32* 24*  CREATININE 2.33* 1.52* 1.29  CALCIUM 8.6 8.5 8.3*   Liver Function Tests:  Recent Labs Lab 11/25/14 1351  AST 18  ALT 13  ALKPHOS 60  BILITOT 0.3  PROT 5.6*  ALBUMIN 3.0*    Recent Labs Lab 11/25/14 1351  LIPASE 36   No results for input(s): AMMONIA in the last 168 hours. CBC:  Recent Labs Lab 11/25/14 1351 11/26/14 0600 11/27/14 0535  WBC 5.9 3.7* 4.3  NEUTROABS 3.9  --   --   HGB 10.8* 9.4* 9.6*  HCT 34.6* 30.1* 31.0*  MCV 98.3 98.0 98.7  PLT 226 181 183   Cardiac Enzymes:  Recent Labs Lab 11/25/14 1351 11/25/14 1830 11/25/14 2155 11/26/14 0006  TROPONINI <0.03 <0.03 <0.03 <0.03   BNP (last 3 results) No results for input(s): BNP in the last 8760 hours.  ProBNP (last 3 results)  Recent Labs  06/10/14 1156  PROBNP 398.9    CBG: No results for input(s): GLUCAP in the last 168 hours.  Recent Results (from the past 240 hour(s))  MRSA PCR Screening     Status: Abnormal   Collection Time: 11/25/14  7:02 PM  Result Value Ref Range Status   MRSA by PCR POSITIVE (A) NEGATIVE Final    Comment:        The GeneXpert MRSA Assay (FDA approved for NASAL specimens only),  is one component of a comprehensive MRSA colonization surveillance program. It is not intended to diagnose MRSA infection nor to guide or monitor treatment for MRSA infections. RESULT CALLED TO, READ BACK BY AND VERIFIED WITH: [facility] RN 1610 11/25/14 MITCHELL,L      Studies: Dg Chest 2 View  11/25/2014   CLINICAL DATA:  Intermittent chest pain for a few weeks with recent worsening.  EXAM: CHEST  2 VIEW  COMPARISON:  06/13/2014 and 11/07/2013  FINDINGS: Lungs are hyperexpanded without focal consolidation or effusion. There is flattening hemidiaphragms with diffuse emphysematous disease. Cardiomediastinal silhouette is within normal. There is calcified plaque over the thoracic aorta. There are mild degenerative changes of the spine.  IMPRESSION: No acute cardiopulmonary disease.  COPD.   Electronically Signed   By: Elberta Fortis M.D.   On: 11/25/2014 14:44   Nm Myocar Multi W/spect W/wall Motion / Ef  11/26/2014   CLINICAL DATA:  Chest pain  EXAM: MYOCARDIAL IMAGING WITH SPECT (REST AND PHARMACOLOGIC-STRESS)  GATED LEFT VENTRICULAR WALL MOTION STUDY  LEFT VENTRICULAR EJECTION FRACTION  TECHNIQUE: Standard myocardial SPECT imaging was performed after resting intravenous injection of 10 mCi Tc-70m sestamibi. Subsequently, intravenous infusion of Lexiscan was performed under the supervision of the Cardiology staff. At peak effect of the drug, 30 mCi Tc-65m sestamibi was injected intravenously and standard myocardial SPECT imaging was performed. Quantitative gated imaging was also performed to evaluate left ventricular wall motion, and estimate left ventricular ejection fraction.  COMPARISON:  None.  FINDINGS: Perfusion: There is a large fixed perfusion defect involving the inferior wall. No stress-induced perfusion defect. There is some fixed thinning at the apex.  Wall Motion: Mild nonspecific septal hypokinesis. Relative normal motion of the inferior wall despite the defect.  Left Ventricular Ejection  Fraction: 50 %  End diastolic volume 61 ml  End systolic volume 30 ml  IMPRESSION: 1. Fixed defect involving the inferior wall.  2. Mild nonspecific septal hypokinesis.  3. Left ventricular ejection fraction 50%  4. Low-risk stress test findings*.  *2012 Appropriate Use Criteria for Coronary Revascularization Focused Update: J Am Coll Cardiol. 2012;59(9):857-881. http://content.dementiazones.com.aspx?articleid=1201161   Electronically Signed   By: Jolaine Click M.D.   On: 11/26/2014 14:11    Scheduled Meds: . aspirin EC  81 mg Oral QHS  . budesonide-formoterol  2 puff Inhalation BID  . Chlorhexidine Gluconate Cloth  6 each Topical Q0600  . heparin subcutaneous  5,000 Units Subcutaneous 3 times per day  . metoprolol tartrate  12.5 mg Oral BID  . mirtazapine  30 mg Oral QHS  . mupirocin ointment  1 application Nasal BID  . pantoprazole  40 mg Oral BID  . tiotropium  18 mcg Inhalation Daily   Continuous Infusions:    Principal Problem:   Chest pain Active Problems:   COPD (chronic obstructive pulmonary disease)   CAD (coronary artery disease)   HTN (hypertension)   Carotid artery disease   PAD (peripheral artery disease)   Malnutrition of moderate degree  Time spent: 87 MINS    [facility] Walker [facility] Pager [phone]. If 7PM-7AM, please contact night-coverage at [email or web address], password [facility] 11/27/2014, 10:09 AM

## 2014-11-27 NOTE — Evaluation (Signed)
Physical Therapy Evaluation Patient Details Name: Alec Walker Cheryle HorsfallDraughn MRN: [id number] DOB: 04-04-1927 Today's Date: 11/27/2014   History of Present Illness  Pt adm with chest pain. Cardiac workup negative. PMH - COPD, CVA, CAD, PVD.  Clinical Impression  Pt doing well with mobility and no further PT needed. Pt at baseline and ready for dc from PT standpoint.      Follow Up Recommendations No PT follow up    Equipment Recommendations  None recommended by PT    Recommendations for Other Services       Precautions / Restrictions Precautions Precautions: Fall Precaution Comments: Occasional falls at home.      Mobility  Bed Mobility Overal bed mobility: Modified Independent                Transfers Overall transfer level: Modified independent                  Ambulation/Gait Ambulation/Gait assistance: Modified independent (Device/Increase time);Supervision Ambulation Distance (Feet): 200 Feet Assistive device: None Gait Pattern/deviations: Step-through pattern;Decreased stride length     General Gait Details: No loss of balance. Provided supervision in hall due to crowded conditions.  Stairs            Wheelchair Mobility    Modified Rankin (Stroke Patients Only)       Balance Overall balance assessment: Needs assistance Sitting-balance support: No upper extremity supported;Feet supported Sitting balance-Leahy Scale: Normal     Standing balance support: No upper extremity supported;During functional activity Standing balance-Leahy Scale: Good                               Pertinent Vitals/Pain Pain Assessment: No/denies pain    Home Living Family/patient expects to be discharged to:: Private residence Living Arrangements: Spouse/significant other Available Help at Discharge: Family;Available PRN/intermittently Type of Home: House Home Access: Stairs to enter Entrance Stairs-Rails: Right Entrance Stairs-Number of  Steps: 7 Home Layout: One level Home Equipment: Walker - 2 wheels;Cane - single point Additional Comments: 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    Prior Function Level of Independence: Independent         Comments: Drives and does cooking at home.     Hand Dominance   Dominant Hand: Right    Extremity/Trunk Assessment   Upper Extremity Assessment: Defer to OT evaluation           Lower Extremity Assessment: Overall WFL for tasks assessed         Communication   Communication: HOH  Cognition Arousal/Alertness: Awake/alert Behavior During Therapy: WFL for tasks assessed/performed Overall Cognitive Status: Within Functional Limits for tasks assessed                      General Comments      Exercises        Assessment/Plan    PT Assessment Patent does not need any further PT services  PT Diagnosis Difficulty walking   PT Problem List    PT Treatment Interventions     PT Goals (Current goals can be found in the Care Plan section) Acute Rehab PT Goals PT Goal Formulation: All assessment and education complete, DC therapy    Frequency     Barriers to discharge        Co-evaluation  End of Session Equipment Utilized During Treatment: Oxygen Activity Tolerance: Patient tolerated treatment well Patient left: in bed;with call bell/phone within reach;with bed alarm set      Functional Assessment Tool Used: clinical judgement Functional Limitation: Mobility: Walking and moving around Mobility: Walking and Moving Around Current Status (Z6109): At least 1 percent but less than 20 percent impaired, limited or restricted Mobility: Walking and Moving Around Goal Status [phone]): At least 1 percent but less than 20 percent impaired, limited or restricted Mobility: Walking and Moving Around Discharge Status [phone]): At least 1 percent but less than 20 percent  impaired, limited or restricted    Time: 1014-1028 PT Time Calculation (min) (ACUTE ONLY): 14 min   Charges:   PT Evaluation $Initial PT Evaluation Tier I: 1 Procedure     PT G Codes:   PT G-Codes **NOT FOR INPATIENT CLASS** Functional Assessment Tool Used: clinical judgement Functional Limitation: Mobility: Walking and moving around Mobility: Walking and Moving Around Current Status (B1478): At least 1 percent but less than 20 percent impaired, limited or restricted Mobility: Walking and Moving Around Goal Status [phone]): At least 1 percent but less than 20 percent impaired, limited or restricted Mobility: Walking and Moving Around Discharge Status [phone]): At least 1 percent but less than 20 percent impaired, limited or restricted    [facility] 11/27/2014, 10:44 AM  [facility] PT [phone]

## 2014-11-27 NOTE — Progress Notes (Signed)
Pt had a pause of 2.4 on the heart monitor and HR dropped briefly in the 40's . He is asymptomatic and HR is currently in the 60's. MD was paged , new order given to cut down on Lopressor dose. Will continue monitor.   Colleen Canesar Jejuan Scala, RN

## 2014-11-27 NOTE — Progress Notes (Signed)
  Echocardiogram 2D Echocardiogram has been performed.  Leta JunglingCooper, Luciano Cinquemani M 11/27/2014, 1:32 PM

## 2014-11-27 NOTE — Evaluation (Signed)
Occupational Therapy Evaluation Patient Details Name: Alec Walker MRN: [id number] DOB: 27-Sep-1926 Today's Date: 11/27/2014    History of Present Illness Pt adm with chest pain. Cardiac workup negative. PMH - COPD, CVA, CAD, PVD.   Clinical Impression   Patient admitted with above. Patient independent>mod I PTA. Patient currently functioning at an independent>mod I level. D/C from acute OT services and no additional follow-up OT needs at this time. All appropriate education provided to patient. Please re-order OT if needed.      Follow Up Recommendations  No OT follow up;Supervision - Intermittent    Equipment Recommendations  None recommended by OT    Recommendations for Other Services  None at this time   Precautions / Restrictions Precautions Precautions: Fall Precaution Comments: Occasional falls at home. Restrictions Weight Bearing Restrictions: No      Mobility Bed Mobility Overal bed mobility: Modified Independent  Transfers Overall transfer level: Modified independent    Balance Overall balance assessment: No apparent balance deficits (not formally assessed) Sitting-balance support: No upper extremity supported;Feet supported Sitting balance-Leahy Scale: Normal     Standing balance support: No upper extremity supported;During functional activity Standing balance-Leahy Scale: Good    ADL Overall ADL's : At baseline;Modified independent     General ADL Comments: Patient overall independent > mod I with self-care and functional mobility/transfers. No further acute OT needs at this time.     Pertinent Vitals/Pain Pain Assessment: No/denies pain     Hand Dominance Right   Extremity/Trunk Assessment Upper Extremity Assessment Upper Extremity Assessment: Overall WFL for tasks assessed   Lower Extremity Assessment Lower Extremity Assessment: Defer to PT evaluation   Cervical / Trunk Assessment Cervical / Trunk Assessment: Normal   Communication  Communication Communication: HOH   Cognition Arousal/Alertness: Awake/alert Behavior During Therapy: WFL for tasks assessed/performed Overall Cognitive Status: Within Functional Limits for tasks assessed             Home Living Family/patient expects to be discharged to:: Private residence Living Arrangements: Spouse/significant other Available Help at Discharge: Family;Available PRN/intermittently Type of Home: House Home Access: Stairs to enter [employer] of Steps: 7 Entrance Stairs-Rails: Right Home Layout: One level     Bathroom Shower/Tub: Walk-in shower;Door   [employer] Toilet: Standard     Home Equipment: Environmental consultant - 2 wheels;Cane - single point   Additional Comments: 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      Prior Functioning/Environment Level of Independence: Independent        Comments: Drives and does cooking at home.    OT Diagnosis:  n/a, no acute OT needs at this time   OT Problem List:   n/a, no acute OT needs at this time   OT Treatment/Interventions:   n/a, no acute OT needs at this time   OT Goals(Current goals can be found in the care plan section) Acute Rehab OT Goals Patient Stated Goal: go home soon  OT Frequency:   n/a, no acute OT needs at this time   Barriers to D/C:  None at this time   End of Session Activity Tolerance: Patient tolerated treatment well Patient left: in bed;with call bell/phone within reach;with bed alarm set   Time: 1055-1106 OT Time Calculation (min): 11 min Charges:  OT General Charges $OT Visit: 1 Procedure OT Evaluation $Initial OT Evaluation Tier I: 1 Procedure G-Codes: OT G-codes **NOT FOR INPATIENT CLASS** Functional Limitation: Self  care Self Care Current Status [phone](G8987): At least 1 percent but less than 20 percent impaired, limited or restricted Self Care Goal Status (U0454(G8988): At least 1 percent but less than 20 percent  impaired, limited or restricted Self Care Discharge Status [phone](G8989): At least 1 percent but less than 20 percent impaired, limited or restricted  Jaron Czarnecki , MS, OTR/L, CLT Pager: [phone]  11/27/2014, 11:12 AM

## 2014-11-27 NOTE — Progress Notes (Signed)
Subjective: Still with SOB wearing oxygen  Objective: Vital signs in last 24 hours: Temp:  [97.8 F (36.6 C)-98.2 F (36.8 C)] 97.8 F (36.6 C) (04/11 0000) Pulse Rate:  [58-75] 63 (04/11 0000) Resp:  [20] 20 (04/11 0000) BP: (119-167)/(54-65) 123/54 mmHg (04/11 0000) SpO2:  [99 %-100 %] 99 % (04/11 0000) Weight:  [116 lb 1.6 oz (52.663 kg)] 116 lb 1.6 oz (52.663 kg) (04/11 0339) Weight change: 2 lb 1.6 oz (0.953 kg) Last BM Date: 11/26/14 Intake/Output from previous day: +240 04/10 0701 - 04/11 0700 In: 1740 [P.O.:840; I.V.:900] Out: 600 [Urine:600] Intake/Output this shift:     PE: General:Pleasant affect, NAD Skin:Warm and dry, brisk capillary refill HEENT:normocephalic, sclera clear, mucus membranes moist Heart:S1S2 RRR without murmur, gallup, rub or click Lungs:diminished in bases without rales, rhonchi, or wheezes ZOX:WRUE, non tender, + BS, do not palpate liver spleen or masses Ext:no lower ext edema, 2+ pedal pulses, 2+ radial pulses Neuro:alert and oriented X 3, MAE, follows commands, + facial symmetry TELE: SB in 40s PACs, non conducted PACs.   Lab Results:  Recent Labs  11/26/14 0600 11/27/14 0535  WBC 3.7* 4.3  HGB 9.4* 9.6*  HCT 30.1* 31.0*  PLT 181 183   BMET  Recent Labs  11/26/14 0600 11/27/14 0535  NA 145 145  K 4.2 4.3  CL 110 116*  CO2 21 25  GLUCOSE 107* 95  BUN 32* 24*  CREATININE 1.52* 1.29  CALCIUM 8.5 8.3*    Recent Labs  11/25/14 2155 11/26/14 0006  TROPONINI <0.03 <0.03    Lab Results  Component Value Date   CHOL 160 06/17/2013   HDL 65 06/17/2013   LDLCALC 65 06/17/2013   TRIG 149 06/17/2013   CHOLHDL 2.5 06/17/2013   Lab Results  Component Value Date   HGBA1C 5.5 06/10/2014     Lab Results  Component Value Date   TSH 1.150 08/26/2013    Hepatic Function Panel  Recent Labs  11/25/14 1351  PROT 5.6*  ALBUMIN 3.0*  AST 18  ALT 13  ALKPHOS 60  BILITOT 0.3   No results for input(s):  CHOL in the last 72 hours. No results for input(s): PROTIME in the last 72 hours.     Studies/Results: Dg Chest 2 View  11/25/2014   CLINICAL DATA:  Intermittent chest pain for a few weeks with recent worsening.  EXAM: CHEST  2 VIEW  COMPARISON:  06/13/2014 and 11/07/2013  FINDINGS: Lungs are hyperexpanded without focal consolidation or effusion. There is flattening hemidiaphragms with diffuse emphysematous disease. Cardiomediastinal silhouette is within normal. There is calcified plaque over the thoracic aorta. There are mild degenerative changes of the spine.  IMPRESSION: No acute cardiopulmonary disease.  COPD.   Electronically Signed   By: Elberta Fortis M.D.   On: 11/25/2014 14:44   Nm Myocar Multi W/spect W/wall Motion / Ef  11/26/2014   CLINICAL DATA:  Chest pain  EXAM: MYOCARDIAL IMAGING WITH SPECT (REST AND PHARMACOLOGIC-STRESS)  GATED LEFT VENTRICULAR WALL MOTION STUDY  LEFT VENTRICULAR EJECTION FRACTION  TECHNIQUE: Standard myocardial SPECT imaging was performed after resting intravenous injection of 10 mCi Tc-8m sestamibi. Subsequently, intravenous infusion of Lexiscan was performed under the supervision of the Cardiology staff. At peak effect of the drug, 30 mCi Tc-100m sestamibi was injected intravenously and standard myocardial SPECT imaging was performed. Quantitative gated imaging was also performed to evaluate left ventricular wall motion, and estimate left ventricular ejection fraction.  COMPARISON:  None.  FINDINGS: Perfusion: There is a large fixed perfusion defect involving the inferior wall. No stress-induced perfusion defect. There is some fixed thinning at the apex.  Wall Motion: Mild nonspecific septal hypokinesis. Relative normal motion of the inferior wall despite the defect.  Left Ventricular Ejection Fraction: 50 %  End diastolic volume 61 ml  End systolic volume 30 ml  IMPRESSION: 1. Fixed defect involving the inferior wall.  2. Mild nonspecific septal hypokinesis.  3. Left  ventricular ejection fraction 50%  4. Low-risk stress test findings*.  *2012 Appropriate Use Criteria for Coronary Revascularization Focused Update: J Am Coll Cardiol. 2012;59(9):857-881. http://content.dementiazones.com.aspx?articleid=1201161   Electronically Signed   By: Jolaine Click M.D.   On: 11/26/2014 14:11   lexiscan myoview 10/17/13:  Impression Exercise Capacity: Lexiscan with no exercise. BP Response: Normal blood pressure response. Clinical Symptoms: No significant symptoms noted. ECG Impression: Resting baseline STTWC Comparison with Prior Nuclear Study: No significant change from  previous study Overall Impression: Low risk stress nuclear study Diaphragmatic  attenuation vs non transmural inferior wall scar w/o ischemia. LV Wall Motion: This was a non gated study   Medications: I have reviewed the patient's current medications. Scheduled Meds: . aspirin EC  81 mg Oral QHS  . budesonide-formoterol  2 puff Inhalation BID  . Chlorhexidine Gluconate Cloth  6 each Topical Q0600  . heparin subcutaneous  5,000 Units Subcutaneous 3 times per day  . metoprolol tartrate  12.5 mg Oral BID  . mirtazapine  30 mg Oral QHS  . mupirocin ointment  1 application Nasal BID  . pantoprazole  40 mg Oral BID  . tiotropium  18 mcg Inhalation Daily   Continuous Infusions: . sodium chloride 75 mL/hr at 11/27/14 0230   PRN Meds:.acetaminophen, albuterol, diazepam, ondansetron (ZOFRAN) IV  Assessment/Plan: 81 yo vasculopath admitted with 2 days of SSCP. Pain free since in ER and this am SSCP wax/wane not exertional No associated dyspnea, pleurisy, diaphoresis . In general does well for his age and does not get regular chest pain. Last cath 1999 with non obstructive disease Last myovue 10/17/13 low risk fixed inferior defect ? Attenuation artifact. Has significant vascular disease followed by Dr Allyson Sabal an Myra Gianotti. Moderate carotid disease by duplex 2014 PVD with bilateral femoral  arterectomy March 2015 after low risk myovue. Ambulation limited more by back pain now. Some COPD Previous smoker. No complaints this am Enzymes negative to date Has CRF with Cr elevated on admission 2.3   Chest Pain: Given age   would like to avoid cath. R/O Pain free since admission. Lexiscan completed, low risk scan, still with SOB wearing oxygen Continue aspirin and beta blocker though HR in the 40s may need to decrease, on 12.5 BID of lopressor  PVD: Stable with no rest pain Needs f/u carotids and ABI's Can by done as outpatient at Dr Allyson Sabal or Brabham's office  CRF: Cr improved since admission2.33--> 1.29 not prohibitive of cath if needed to   COPD:  ? Causing SOB     Time spent with pt. :15 minutes. [facility] R  Nurse Practitioner Certified Pager [phone] or after 5pm and on weekends call [phone] 11/27/2014, 8:10 AM   Attending Note:   The patient was seen and examined.  Agree with assessment and plan as noted above.  Changes made to the above note as needed.  Pt is doing well. He is on home O2 - his breathing is at baseline He has a few rales on exam. Clinically looks stable.  myoview is low risk.  No further cardiac work up is needed. All troponin levels are negative.  Will sign off.  Call for further questions.     Vesta MixerPhilip J. Kasch Borquez, Montez HagemanJr., MD, [facility] Hospital[facility]ACC 11/27/2014, 10:25 AM [address],  [address] Office [phone] Pager [phone]

## 2014-11-27 NOTE — Progress Notes (Signed)
INITIAL NUTRITION ASSESSMENT  DOCUMENTATION CODES Per approved criteria  -Severe malnutrition in the context of chronic illness   INTERVENTION:  Strawberry Ensure Enlive PO BID, each supplement provides 350 kcal and 20 grams of protein  NUTRITION DIAGNOSIS: Malnutrition related to inadequate oral intake as evidenced by severe depletion of muscle and subcutaneous fat mass.   Goal: Intake to meet >90% of estimated nutrition needs.  Monitor:  PO intake, labs, weight trend.  Reason for Assessment: Malnutrition Screening Tool  81 y.o. male  Admitting Dx: Chest pain  ASSESSMENT: Patient presented to the hospital on 4/9 with chest pain of unknown etiology, acute on chronic renal failure, SOB. S/P lexiscan myoview study which showed a fixed defect involving the inferior wall; no follow-up needed per Cardiology.  Patient reports that he usually eats 1-2 meals per day and snacks the rest of his day. He is responsible for preparing meals for he and his wife, so he does not eat a lot. He endorses progressive weight loss starting 3 years ago when he weighed 156 lbs.  Nutrition Focused Physical Exam:  Subcutaneous Fat:  Orbital Region: moderate depletion Upper Arm Region: moderate-severe depletion Thoracic and Lumbar Region: severe depletion  Muscle:  Temple Region: moderate depletion Clavicle Bone Region: severe depletion Clavicle and Acromion Bone Region: severe depletion Scapular Bone Region: severe depletion Dorsal Hand: severe depletion Patellar Region: severe depletion Anterior Thigh Region: severe depletion Posterior Calf Region: severe depletion  Edema: none    Height: Ht Readings from Last 1 Encounters:  11/25/14  (1.778 m)    Weight: Wt Readings from Last 1 Encounters:  11/27/14 116 lb 1.6 oz (52.663 kg)    Ideal Body Weight: 75.5 kg  % Ideal Body Weight: 70%  Wt Readings from Last 10 Encounters:  11/27/14 116 lb 1.6 oz (52.663 kg)  06/12/14 118 lb  6.2 oz (53.7 kg)  03/27/14 119 lb 6.4 oz (54.159 kg)  11/28/13 124 lb 8 oz (56.473 kg)  11/11/13 121 lb 11.1 oz (55.2 kg)  10/31/13 122 lb 9.6 oz (55.611 kg)  10/10/13 117 lb 8 oz (53.298 kg)  09/20/13 119 lb (53.978 kg)  09/01/13 117 lb (53.071 kg)  08/04/13 117 lb (53.071 kg)    Usual Body Weight: 124 lbs (1 year ago); 156 lbs (3 years ago)  % Usual Body Weight: 93.5%  BMI:  Body mass index is 16.66 kg/(m^2).  Estimated Nutritional Needs: Kcal: 1550-1750 Protein: 75-85 gm Fluid: 1.6-1.8 L  Skin: no issues  Diet Order: Diet Heart Room service appropriate?: Yes; Fluid consistency:: Thin  EDUCATION NEEDS: -Education needs addressed   Intake/Output Summary (Last 24 hours) at 11/27/14 1456 Last data filed at 11/27/14 1230  Gross per 24 hour  Intake   2100 ml  Output    800 ml  Net   1300 ml    Last BM: 4/10   Labs:   Recent Labs Lab 11/25/14 1351 11/26/14 0600 11/27/14 0535  NA 142 145 145  K 4.5 4.2 4.3  CL 110 110 116*  CO2 BUN 39* 32* 24*  CREATININE 2.33* 1.52* 1.29  CALCIUM 8.6 8.5 8.3*  GLUCOSE 64* 107* 95    CBG (last 3)  No results for input(s): GLUCAP in the last 72 hours.  Scheduled Meds: . aspirin EC  81 mg Oral QHS  . budesonide-formoterol  2 puff Inhalation BID  . Chlorhexidine Gluconate Cloth  6 each Topical Q0600  . feeding supplement (ENSURE ENLIVE)  237 mL  Oral BID BM  . heparin subcutaneous  5,000 Units Subcutaneous 3 times per day  . [START ON 11/28/2014] metoprolol tartrate  12.5 mg Oral Daily  . mirtazapine  30 mg Oral QHS  . mupirocin ointment  1 application Nasal BID  . pantoprazole  40 mg Oral BID  . tiotropium  18 mcg Inhalation Daily    Continuous Infusions:   Past Medical History  Diagnosis Date  . COPD (chronic obstructive pulmonary disease)   . CAD (coronary artery disease)     a.  nonobstructive CAD by cath 1999. b. Myoview 04/2012: fixed inferior and inferoseptal bowel attenuation artifact, no  reversible ischemia. EF 62%. Low risk scan.  Marland Kitchen. HTN (hypertension)   . PUD (peptic ulcer disease)     a. s/p surgery 1976.  Marland Kitchen. Syncope     2012  . Carotid artery disease 03/22/13    a. 50-69% BICA by duplex 03/2013, repeat needed 11/2013.  Marland Kitchen. PAD (peripheral artery disease)     a. prior stenting of RCIA 1999. b. history of 90% vertebral artery narrowing, 40% subclavian artery narrowing. c. Diagnostic PV angio 08/2013 for progressive claudication - will ultimately need endarterectomy/patch angio on bilat CFA stenosis; will also need atherectomy/PT/stenting to REIA.   . CKD (chronic kidney disease), stage III   . HLD (hyperlipidemia)   . Chronic respiratory failure     a. On home O2.  Marland Kitchen. NSVT (nonsustained ventricular tachycardia)     a. Brief NSVT (6b) during 08/2013 adm.  . Stroke   . Shortness of breath   . On supplemental oxygen therapy     2l    Past Surgical History  Procedure Laterality Date  . Cataract extraction    . Gastrectomy    . Transurethral resection of prostate    . Coronary angioplasty with stent placement      stent in 2003; history of right common iliac artery stent hx  . Endarterectomy femoral Bilateral 11/11/2013    Procedure: BILATERAL FEMORAL ENDARTERECTOMIES WITH BILATERAL PATCH ANGIOPLASTIES;  Surgeon: Nada LibmanVance W Brabham, MD;  [facility];  Service: Vascular;  Laterality: Bilateral;  . Lower extremity angiogram N/A 09/01/2013    Procedure: LOWER EXTREMITY ANGIOGRAM;  Surgeon: Runell GessJonathan J Berry, MD;  [facility];  Service: Cardiovascular;  Laterality: N/A;     Joaquin CourtsKimberly Harris, RD, LDN, CNSC Pager [phone] After Hours Pager [phone]

## 2014-11-27 NOTE — Progress Notes (Signed)
UR completed 

## 2014-11-28 ENCOUNTER — Observation Stay (HOSPITAL_COMMUNITY): Payer: Medicare Other

## 2014-11-28 DIAGNOSIS — I10 Essential (primary) hypertension: Secondary | ICD-10-CM | POA: Diagnosis not present

## 2014-11-28 DIAGNOSIS — E43 Unspecified severe protein-calorie malnutrition: Secondary | ICD-10-CM | POA: Diagnosis not present

## 2014-11-28 DIAGNOSIS — N179 Acute kidney failure, unspecified: Secondary | ICD-10-CM | POA: Diagnosis not present

## 2014-11-28 DIAGNOSIS — R079 Chest pain, unspecified: Secondary | ICD-10-CM | POA: Diagnosis not present

## 2014-11-28 LAB — BASIC METABOLIC PANEL
Anion gap: 6 (ref 5–15)
BUN: 30 mg/dL — AB (ref 6–23)
CO2: 28 mmol/L (ref 19–32)
Calcium: 8.9 mg/dL (ref 8.4–10.5)
Chloride: 108 mmol/L (ref 96–112)
Creatinine, Ser: 1.39 mg/dL — ABNORMAL HIGH (ref 0.50–1.35)
GFR calc Af Amer: 52 mL/min — ABNORMAL LOW (ref >90)
GFR, EST NON AFRICAN AMERICAN: 45 mL/min — AB (ref >90)
GLUCOSE: 98 mg/dL (ref 70–99)
Potassium: 4.2 mmol/L (ref 3.5–5.1)
Sodium: 142 mmol/L (ref 135–145)

## 2014-11-28 MED ORDER — FUROSEMIDE 10 MG/ML IJ SOLN
40.0000 mg | Freq: Once | INTRAMUSCULAR | Status: AC
  Administered 2014-11-28: 40 mg via INTRAVENOUS

## 2014-11-28 NOTE — Progress Notes (Signed)
UR completed 

## 2014-11-28 NOTE — Progress Notes (Signed)
Patient has met adequate criteria for discharge per MD order. All required education, prescriptions, current and new medicines, follow-up appointments and patient discharge summary was printed and given to patient. All hospital equipment and invasive lined were removed. Patient will be escorted via wheelchair by floor medical tech by night shift RN.

## 2014-11-28 NOTE — Discharge Summary (Signed)
Physician Discharge Summary  GWEN EDLER ZOX:[id number] DOB: 1927-06-26 DOA: 11/25/2014  PCP: Eartha Inch, MD  Admit date: 11/25/2014 Discharge date: 11/28/2014  Time spent: 65 minutes  Recommendations for Outpatient Follow-up:  1. Follow-up with Dr. Allyson Sabal in 1-2 weeks.  2. Follow up with BADGER,MICHAEL C, MD in 1 week. On follow-up patient needs a basic metabolic profile done to follow-up on his electrolytes and renal function.  Discharge Diagnoses:  Principal Problem:   Chest pain Active Problems:   COPD (chronic obstructive pulmonary disease)   CAD (coronary artery disease)   HTN (hypertension)   Carotid artery disease   PAD (peripheral artery disease)   Malnutrition of moderate degree   Acute kidney injury   SOB (shortness of breath)   Protein-calorie malnutrition, severe   Discharge Condition: Stable and improved  Diet recommendation: Heart healthy  Filed Weights   11/26/14 0400 11/27/14 0339 11/28/14 0435  Weight: 51.988 kg (114 lb 9.8 oz) 52.663 kg (116 lb 1.6 oz) 51.71 kg (114 lb)    History of present illness:  Alec Walker is a 81 y.o. male has a past medical history significant for COPD, coronary artery disease, hypertension, chronic kidney disease stage III, hyperlipidemia, presented to the emergency room with a chief complaint chest pain. Patient tells me that yesterday and 2 days ago had an episode of substernal chest pain is radiating across his chest, felt like pressure, lasted about 45 minutes. He was sitting down at a time. He denied any new shortness of breath. He has chronic oxygen at home, and normally is somewhat dyspneic, however he felt like he was at baseline. He denied any recent fever or chills. He endorsed chronic abdominal pain with nausea, denied any vomiting. He endorsed chronic lightheadedness and dizziness for several years without any change in its characteristic. He denied any lower extremity swelling. He never had chest pain like this  before. He has had intermittent reflux type symptoms, however this was different. In the emergency room, patient had stable vital signs, his blood work showed a creatinine of 2.3 which is a little bit higher than his baseline, his initial troponin is negative. His chest x-ray was without acute cardiopulmonary disease. His EKG showed ST depression in V2, 3, lead 2, which have been there before. EDP discussed the case and the EKG findings were discussed with on-call cardiology, and findings on the EKG were deemed not worrisome and recommendations were for observation for chest pain and cycle troponins overnight. TRH was asked for admission.   Hospital Course:  #1 chest pain Questionable etiology. Patient with multiple risk factors. Patient was admitted to telemetry.  Cardiac enzymes negative 3. 2-D echo from 06/11/2014 with a EF of 55-60%. Moderate focal basal septal hypertrophy. Moderately dilated left atrium. Patient underwent Lexis scan Myoview study which showed a fixed defect involving the inferior wall. Mild nonspecific septal hypokinesis. EF 50%. Low risk stress test. Patient remained chest pain-free throughout the hospitalization. Patient was maintained on his home regimen of aspirin and Lopressor. Cardiology followed patient throughout the hospitalization and felt no further cardiac workup was needed at this time. Patient be discharged home in stable and improved condition will follow-up with his cardiologist as outpatient.   #2 acute on chronic renal failure Baseline creatinine 1.2-1.4. Creatinine on admission was 2.3. Patient had been on NSAIDs which have been discontinued. Patient looked volume depleted. Patient was hydrated with IV fluids with improvement in renal function which is currently at 1.39 on day of discharge. Follow.  #  3 SOB Likely secondary to IVF hydration. Patient was given IV Lasix with clinical improvement back to his baseline.  #4 gastroesophageal reflux disease PPI.  #5  COPD Stable. Continued on Spiriva and Symbicort.  #6 coronary artery disease Stable. Continued on home regimen of aspirin and Lopressor.  #7 peripheral vascular disease Outpatient follow-up with vascular surgery and cardiology Dr. Allyson SabalBerry.  #8 of protein calorie malnutrition Patient was seen by the dietitian. Patient was placed on nutritional supplementations.  Procedures:  Chest x-ray 11/25/2014, 11/27/14  Myoview stress test 11/26/2014  Acute abdominal series 11/28/2014  Consultations:  Cardiology: Dr. Eden EmmsNishan 11/26/2014  Discharge Exam: Filed Vitals:   11/28/14 0435  BP: 140/55  Pulse: 79  Temp: 97.7 F (36.5 C)  Resp:     General: NAD Cardiovascular: RRR Respiratory: CTAB  Discharge Instructions   Discharge Instructions    Diet - low sodium heart healthy    Complete by:  As directed      Discharge instructions    Complete by:  As directed   Follow up with Eartha InchBADGER,MICHAEL C, MD in1 week. Follow up with Dr Allyson SabalBerry as scheduled.     Increase activity slowly    Complete by:  As directed           Current Discharge Medication List    CONTINUE these medications which have NOT CHANGED   Details  albuterol (PROVENTIL HFA;VENTOLIN HFA) 108 (90 BASE) MCG/ACT inhaler Inhale 2 puffs into the lungs every 4 (four) hours as needed for wheezing or shortness of breath. Qty: 1 Inhaler, Refills: 0    aspirin EC 81 MG tablet Take 81 mg by mouth at bedtime.    budesonide-formoterol (SYMBICORT) 160-4.5 MCG/ACT inhaler Inhale 2 puffs into the lungs 2 (two) times daily.     diazepam (VALIUM) 5 MG tablet Take 5 mg by mouth every 8 (eight) hours as needed (sleep).    metoprolol tartrate (LOPRESSOR) 25 MG tablet Take 25 mg by mouth 2 (two) times daily.     mirtazapine (REMERON) 30 MG tablet Take 30 mg by mouth at bedtime.    OXYGEN-HELIUM IN Inhale into the lungs as needed. 2 liters    tiotropium (SPIRIVA HANDIHALER) 18 MCG inhalation capsule Place 1 capsule (18 mcg total)  into inhaler and inhale daily. Qty: 30 capsule, Refills: 0    lactose free nutrition (BOOST PLUS) LIQD Take 237 mLs by mouth 3 (three) times daily with meals. Qty: 90 Can, Refills: 0    nitroGLYCERIN (NITROSTAT) 0.4 MG SL tablet Place 1 tablet (0.4 mg total) under the tongue every 5 (five) minutes as needed for chest pain. Qty: 20 tablet, Refills: 0    pantoprazole (PROTONIX) 40 MG tablet Take 40 mg by mouth 2 (two) times daily.      STOP taking these medications     diclofenac (VOLTAREN) 75 MG EC tablet      amoxicillin-clavulanate (AUGMENTIN) 875-125 MG per tablet      predniSONE (DELTASONE) 20 MG tablet        No Known Allergies Follow-up Information    Follow up with Runell GessBERRY,JONATHAN J, MD. Schedule an appointment as soon as possible for a visit in 2 weeks.   Specialty:  Cardiology   Contact information:   [address] [place] [id number] [phone]       Follow up with Eartha InchBADGER,MICHAEL C, MD. Schedule an appointment as soon as possible for a visit in 1 week.   Specialty:  Family Medicine   Contact information:  [address] [place] [address] [phone]        The results of significant diagnostics from this hospitalization (including imaging, microbiology, ancillary and laboratory) are listed below for reference.    Significant Diagnostic Studies: Dg Chest 2 View  11/25/2014   CLINICAL DATA:  Intermittent chest pain for a few weeks with recent worsening.  EXAM: CHEST  2 VIEW  COMPARISON:  06/13/2014 and 11/07/2013  FINDINGS: Lungs are hyperexpanded without focal consolidation or effusion. There is flattening hemidiaphragms with diffuse emphysematous disease. Cardiomediastinal silhouette is within normal. There is calcified plaque over the thoracic aorta. There are mild degenerative changes of the spine.  IMPRESSION: No acute cardiopulmonary disease.  COPD.   Electronically Signed   By: Elberta Fortis M.D.   On: 11/25/2014 14:44   Nm  Myocar Multi W/spect W/wall Motion / Ef  11/26/2014   CLINICAL DATA:  Chest pain  EXAM: MYOCARDIAL IMAGING WITH SPECT (REST AND PHARMACOLOGIC-STRESS)  GATED LEFT VENTRICULAR WALL MOTION STUDY  LEFT VENTRICULAR EJECTION FRACTION  TECHNIQUE: Standard myocardial SPECT imaging was performed after resting intravenous injection of 10 mCi Tc-79m sestamibi. Subsequently, intravenous infusion of Lexiscan was performed under the supervision of the Cardiology staff. At peak effect of the drug, 30 mCi Tc-31m sestamibi was injected intravenously and standard myocardial SPECT imaging was performed. Quantitative gated imaging was also performed to evaluate left ventricular wall motion, and estimate left ventricular ejection fraction.  COMPARISON:  None.  FINDINGS: Perfusion: There is a large fixed perfusion defect involving the inferior wall. No stress-induced perfusion defect. There is some fixed thinning at the apex.  Wall Motion: Mild nonspecific septal hypokinesis. Relative normal motion of the inferior wall despite the defect.  Left Ventricular Ejection Fraction: 50 %  End diastolic volume 61 ml  End systolic volume 30 ml  IMPRESSION: 1. Fixed defect involving the inferior wall.  2. Mild nonspecific septal hypokinesis.  3. Left ventricular ejection fraction 50%  4. Low-risk stress test findings*.  *2012 Appropriate Use Criteria for Coronary Revascularization Focused Update: J Am Coll Cardiol. 2012;59(9):857-881. http://content.dementiazones.com.aspx?articleid=1201161   Electronically Signed   By: Jolaine Click M.D.   On: 11/26/2014 14:11   Dg Chest Port 1 View  11/27/2014   CLINICAL DATA:  Shortness breath.  EXAM: PORTABLE CHEST - 1 VIEW  COMPARISON:  Two-view chest x-ray 11/25/2014  FINDINGS: The heart size is normal. Atherosclerotic calcifications are noted in the aortic arch. There is mild edema superimposed on chronic interstitial coarsening. No focal airspace consolidation is evident.  IMPRESSION: 1. Mild edema  superimposed on chronic interstitial coarsening. 2. Emphysema.   Electronically Signed   By: Marin Roberts M.D.   On: 11/27/2014 13:00   Dg Abd Acute W/chest  11/28/2014   CLINICAL DATA:  Shortness of breath, nausea. History of hypertension, COPD, CAD  EXAM: DG ABDOMEN ACUTE W/ 1V CHEST  COMPARISON:  11/27/2014  FINDINGS: There is hyperinflation of the lungs compatible with COPD. Heart is normal size. No confluent airspace opacities or effusions.  Nonobstructive bowel gas pattern. Moderate gas and stool throughout the colon. Prior cholecystectomy. No free air organomegaly.  Dense vascular calcifications throughout the aorta, branch vessels and iliac vessels. Stent noted in the right common iliac artery. No aneurysm.  IMPRESSION: COPD.  No acute cardiopulmonary disease.  No evidence of bowel obstruction or free air.   Electronically Signed   By: Charlett Nose M.D.   On: 11/28/2014 17:36    Microbiology: Recent Results (from the past 240 hour(s))  MRSA PCR Screening     Status: Abnormal   Collection Time: 11/25/14  7:02 PM  Result Value Ref Range Status   MRSA by PCR POSITIVE (A) NEGATIVE Final    Comment:        The GeneXpert MRSA Assay (FDA approved for NASAL specimens only), is one component of a comprehensive MRSA colonization surveillance program. It is not intended to diagnose MRSA infection nor to guide or monitor treatment for MRSA infections. RESULT CALLED TO, READ BACK BY AND VERIFIED WITH: [facility] RN [id number] 11/25/14 MITCHELL,L      Labs: Basic Metabolic Panel:  Recent Labs Lab 11/25/14 1351 11/26/14 0600 11/27/14 0535 11/28/14 0534  NA 142 145 145 142  K 4.5 4.2 4.3 4.2  CL 110 110 116* 108  CO2 GLUCOSE 64* 107* 95 98  BUN 39* 32* 24* 30*  CREATININE 2.33* 1.52* 1.29 1.39*  CALCIUM 8.6 8.5 8.3* 8.9   Liver Function Tests:  Recent Labs Lab 11/25/14 1351  AST 18  ALT 13  ALKPHOS 60  BILITOT 0.3  PROT 5.6*  ALBUMIN 3.0*    Recent  Labs Lab 11/25/14 1351  LIPASE 36   No results for input(s): AMMONIA in the last 168 hours. CBC:  Recent Labs Lab 11/25/14 1351 11/26/14 0600 11/27/14 0535  WBC 5.9 3.7* 4.3  NEUTROABS 3.9  --   --   HGB 10.8* 9.4* 9.6*  HCT 34.6* 30.1* 31.0*  MCV 98.3 98.0 98.7  PLT 226 181 183   Cardiac Enzymes:  Recent Labs Lab 11/25/14 1351 11/25/14 1830 11/25/14 2155 11/26/14 0006  TROPONINI <0.03 <0.03 <0.03 <0.03   BNP: BNP (last 3 results) No results for input(s): BNP in the last 8760 hours.  ProBNP (last 3 results)  Recent Labs  06/10/14 1156  PROBNP 398.9    CBG: No results for input(s): GLUCAP in the last 168 hours.     SignedRamiro Harvest MD [facility] 11/28/2014, 6:18 PM

## 2014-11-29 ENCOUNTER — Telehealth: Payer: Self-pay | Admitting: Cardiovascular Disease

## 2014-11-29 NOTE — Telephone Encounter (Signed)
Closed encounter °

## 2014-12-11 ENCOUNTER — Other Ambulatory Visit: Payer: Self-pay | Admitting: Cardiovascular Disease

## 2014-12-11 ENCOUNTER — Telehealth (HOSPITAL_COMMUNITY): Payer: Self-pay | Admitting: *Deleted

## 2014-12-11 DIAGNOSIS — I739 Peripheral vascular disease, unspecified: Secondary | ICD-10-CM

## 2014-12-11 DIAGNOSIS — I6529 Occlusion and stenosis of unspecified carotid artery: Secondary | ICD-10-CM

## 2014-12-18 ENCOUNTER — Encounter (HOSPITAL_COMMUNITY): Payer: Self-pay | Admitting: *Deleted

## 2015-01-02 ENCOUNTER — Ambulatory Visit (HOSPITAL_COMMUNITY)
Admission: RE | Admit: 2015-01-02 | Discharge: 2015-01-02 | Disposition: A | Discharge status: Home / Self Care | Payer: Medicare Other | Source: Ambulatory Visit | Attending: Cardiovascular Disease | Admitting: Cardiovascular Disease

## 2015-01-02 ENCOUNTER — Ambulatory Visit (HOSPITAL_BASED_OUTPATIENT_CLINIC_OR_DEPARTMENT_OTHER)
Admission: RE | Admit: 2015-01-02 | Discharge: 2015-01-02 | Disposition: A | Discharge status: Home / Self Care | Payer: Medicare Other | Source: Ambulatory Visit | Attending: Cardiovascular Disease | Admitting: Cardiovascular Disease

## 2015-01-02 DIAGNOSIS — Z95820 Peripheral vascular angioplasty status with implants and grafts: Secondary | ICD-10-CM | POA: Diagnosis not present

## 2015-01-02 DIAGNOSIS — I6529 Occlusion and stenosis of unspecified carotid artery: Secondary | ICD-10-CM

## 2015-01-02 DIAGNOSIS — I739 Peripheral vascular disease, unspecified: Secondary | ICD-10-CM | POA: Insufficient documentation

## 2015-01-02 DIAGNOSIS — I6523 Occlusion and stenosis of bilateral carotid arteries: Secondary | ICD-10-CM | POA: Insufficient documentation

## 2015-01-17 ENCOUNTER — Ambulatory Visit (INDEPENDENT_AMBULATORY_CARE_PROVIDER_SITE_OTHER): Payer: Medicare Other | Admitting: Cardiovascular Disease

## 2015-01-17 ENCOUNTER — Ambulatory Visit: Payer: Medicare Other | Admitting: Cardiovascular Disease

## 2015-01-17 ENCOUNTER — Encounter: Payer: Self-pay | Admitting: Cardiovascular Disease

## 2015-01-17 DIAGNOSIS — I739 Peripheral vascular disease, unspecified: Secondary | ICD-10-CM | POA: Diagnosis not present

## 2015-01-17 DIAGNOSIS — I6523 Occlusion and stenosis of bilateral carotid arteries: Secondary | ICD-10-CM

## 2015-01-17 DIAGNOSIS — I779 Disorder of arteries and arterioles, unspecified: Secondary | ICD-10-CM

## 2015-01-17 DIAGNOSIS — I2510 Atherosclerotic heart disease of native coronary artery without angina pectoris: Secondary | ICD-10-CM | POA: Diagnosis not present

## 2015-01-17 DIAGNOSIS — I2583 Coronary atherosclerosis due to lipid rich plaque: Secondary | ICD-10-CM

## 2015-01-17 MED ORDER — AMLODIPINE BESYLATE 2.5 MG PO TABS: 2.5000 mg | ORAL_TABLET | Freq: Every day | ORAL | Status: DC

## 2015-01-17 NOTE — Assessment & Plan Note (Signed)
History of peripheral vascular disease status post remote doing of his right common iliac artery. I am trimmed him 09/01/13 revealing 70% ostial right common iliac artery stenosis of 50-60% left, 95% calcified bilateral common femoral artery stenoses and a 95% calcified right external iliac artery stenosis. 2 months later he underwent bilateral common femoral endarterectomy and patch angioplasty by Dr. Myra GianottiBrabham resulting in marked improvement in his symptoms. Recent Dopplers performed 01/02/15 revealed what appears to be recurrent stenosis in his right common femoral artery with disease in his right common and external iliac artery. He denies symptoms on the right side and has mild symptoms on the left which do not sound like claudication.

## 2015-01-17 NOTE — Patient Instructions (Addendum)
Medication Instructions:   Start Amlodipine 2.5mg  daily for your blood pressure.  I have sent the prescription into the pharmacy for you.   Labwork:  n/a  Testing/Procedures:  Lower extremity arterial doppler (to be done in 6 months)- During this test, ultrasound is used to evaluate arterial blood flow in the legs. Allow approximately one hour for this exam.    Follow-Up:  1 month with Belenda CruiseKristin for blood pressure management 6 months with Dr Allyson SabalBerry  Any Other Special Instructions Will Be Listed Below (If Applicable).

## 2015-01-17 NOTE — Assessment & Plan Note (Signed)
History of hyperlipidemia not on statin drugs. His most recent lipid profile performed 06/17/13 revealed total cholesterol 160, LDL 65 and HDL 65

## 2015-01-17 NOTE — Assessment & Plan Note (Signed)
History of CAD with cath performed in 1999 revealing noncritical CAD he was recently admitted to [facility] because of chest pain and ruled out for myocardial infarction. A Myoview stress test showed no evidence of ischemia. He's had no recurrent symptoms.

## 2015-01-17 NOTE — Assessment & Plan Note (Signed)
History of hypertension blood pressure measured today at 188/66. His only anti-hypertensive medication is metoprolol. I'm going add low-dose amlodipine and will have him follow-up in our clinic in one month to reassess his blood pressure

## 2015-01-17 NOTE — Progress Notes (Signed)
01/17/2015 Caetano Oberhaus Runkel   03/07/1927  [id number]  Primary Physician Eartha Inch, MD Primary Cardiologist: Runell Gess MD [name]   HPI:  Mr. Alec Walker is an 81 year old thin appearing Caucasian male coming by his daughter today. He is a long-time patient of Dr. Kandis Cocking. He has a history of mild CAD by remote cardiac catheterization with 40% LAD stenosis, 40% diagonal stenosis and 40% RCA stenosis by cardiac catheterization which was done in 1999 at [facility]. He has documented peripheral vascular disease and has documented 90% vertebral artery narrowing as well as 40% subclavian artery narrowing. In 1999 he underwent intervention with stenting of his right common iliac artery. He has a history of documented carotid disease. He apparently underwent carotid Doppler study which had been ordered by Dr. Alanda Amass in August 2014 which showed 50-69% diameter reduction bilaterally with velocities in the upper range of the spectrum. He doesn't admit to claudication symptoms to his leg. He does note more cramping in the back of his legs. His last lower LE Doppler study was done in October 2013 which showed normal ABIs. He had distal taper of his aorta. His right common iliac stent had elevated velocities suggesting a 50-69% diameter reduction. The right common femoral artery had 50-69% stenosis. The right peroneal seem to be occlusive. The proximal posterior tibial artery on the right suggesting 50-69% reduction. The left common iliac artery suggested narrowing less than 50%. The left common femoral had 50-69% diameter reduction in the left anterior tibial also appear to be occlusive with reconstitution noted. He states that at times he has noted some episodes of dizziness. He did have antegrade flow in both vertebrals on his most recent carotid study. He does have 50-69% diameter reduction on the study of the left subclavian.  Additional problems include hyperlipidemia  for which he's been taking Pravachol. Depression for which he takes mirtazapine. COPD for which he has been on Spiriva the he is on nocturnal oxygen at night at 2 L. He is status post peptic ulcer disease surgery by Dr. Orpah Greek in 1976.  Review of his chart indicates that he did have a nuclear perfusion study in September 2013 which suggested diaphragmatic attenuation inferiorly. Post-stress ejection fraction was 62%. His last echo Doppler study was 2 years ago which showed LVH.  His recent arterial Dopplers that showed progression of disease in his common iliac and common femoral arteries. He's had progressive left lower leg claudication as well. He's also had progression of disease in his internal carotid arteries bilaterally and remains neurologically asymptomatic. I performed a peripheral angiography on him 09/01/13 revealing 95% calcified bilateral common femoral artery stenoses. A 95% right external iliac stenosis as well as a 70% ostial right common iliac artery stenosis within the previously placed stent. He does have orthopnea and claudication. I will do arrange for him to see Dr. Myra Gianotti for bilateral common femoral endarterectomies followed by potential staged diamondback orbital rotational atherectomy of his right external iliac artery. Dr. Myra Gianotti performed bilateral common femoral endarterectomies with past patch and plasties 11/11/13 resulting in marked improvement in his symptoms. His most recent lower extremity arterial Doppler studies performed 01/02/15 revealed a left ABI 1.2 with a widely patent endarterectomy site. His right lower extremity showed high-frequency signal in his right common iliac artery and right common femoral artery although he denies any symptoms. He was recently admitted with chest pain to [facility] and ruled out for myocardial infarction. A Myoview stress test showed  diaphragmatic attenuation without ischemia.   Current Outpatient Prescriptions  Medication Sig  Dispense Refill  . albuterol (PROVENTIL HFA;VENTOLIN HFA) 108 (90 BASE) MCG/ACT inhaler Inhale 2 puffs into the lungs every 4 (four) hours as needed for wheezing or shortness of breath. 1 Inhaler 0  . aspirin EC 81 MG tablet Take 81 mg by mouth at bedtime.    . budesonide-formoterol (SYMBICORT) 160-4.5 MCG/ACT inhaler Inhale 2 puffs into the lungs 2 (two) times daily.     . Cholecalciferol (VITAMIN D3) 2000 UNITS TABS Take 2,000 mg by mouth daily.    . diazepam (VALIUM) 5 MG tablet Take 5 mg by mouth every 8 (eight) hours as needed (sleep).    . diclofenac (VOLTAREN) 75 MG EC tablet Take 75 mg by mouth.    . metoprolol tartrate (LOPRESSOR) 25 MG tablet Take 25 mg by mouth 2 (two) times daily.     . mirtazapine (REMERON) 30 MG tablet Take 30 mg by mouth at bedtime.    . nitroGLYCERIN (NITROSTAT) 0.4 MG SL tablet Place 1 tablet (0.4 mg total) under the tongue every 5 (five) minutes as needed for chest pain. 20 tablet 0  . OXYGEN-HELIUM IN Inhale into the lungs as needed. 2 liters    . pantoprazole (PROTONIX) 40 MG tablet Take 40 mg by mouth 2 (two) times daily.    . temazepam (RESTORIL) 30 MG capsule Take 30 mg by mouth at bedtime as needed. for sleep  2  . tiotropium (SPIRIVA HANDIHALER) 18 MCG inhalation capsule Place 1 capsule (18 mcg total) into inhaler and inhale daily. 30 capsule 0  . amLODipine (NORVASC) 2.5 MG tablet Take 1 tablet (2.5 mg total) by mouth daily. 30 tablet 11   No current facility-administered medications for this visit.    Allergies  Allergen Reactions  . Ferrous Sulfate Nausea And Vomiting    History   Social History  . Marital Status: Married    Spouse Name: N/A  . Number of Children: 3  . Years of Education: N/A   Occupational History  . retired     Social History Main Topics  . Smoking status: Former Smoker -- 1.00 packs/day for 60 years    Types: Cigarettes    Quit date: 01/06/1997  . Smokeless tobacco: Never Used  . Alcohol Use: No     Comment: hx  etoh abuse in past, daughter states quit over 20 years ago  . Drug Use: No  . Sexual Activity: No   Other Topics Concern  . Not on file   Social History Narrative   Lives with his wife who is disabled.  He does not use any assist device.  He mows grass, cooks, etc and cares for her.       Review of Systems: General: negative for chills, fever, night sweats or weight changes.  Cardiovascular: negative for chest pain, dyspnea on exertion, edema, orthopnea, palpitations, paroxysmal nocturnal dyspnea or shortness of breath Dermatological: negative for rash Respiratory: negative for cough or wheezing Urologic: negative for hematuria Abdominal: negative for nausea, vomiting, diarrhea, bright red blood per rectum, melena, or hematemesis Neurologic: negative for visual changes, syncope, or dizziness All other systems reviewed and are otherwise negative except as noted above.    There were no vitals taken for this visit.  General appearance: alert and no distress Neck: no adenopathy, no JVD, supple, symmetrical, trachea midline, thyroid not enlarged, symmetric, no tenderness/mass/nodules and soft bilateral carotid bruits Lungs: clear to auscultation bilaterally Heart: regular rate and  rhythm, S1, S2 normal, no murmur, click, rub or gallop Extremities: extremities normal, atraumatic, no cyanosis or edema  EKG not performed today  ASSESSMENT AND PLAN:   PVD (peripheral vascular disease) History of peripheral vascular disease status post remote doing of his right common iliac artery. I am trimmed him 09/01/13 revealing 70% ostial right common iliac artery stenosis of 50-60% left, 95% calcified bilateral common femoral artery stenoses and a 95% calcified right external iliac artery stenosis. 2 months later he underwent bilateral common femoral endarterectomy and patch angioplasty by Dr. Myra GianottiBrabham resulting in marked improvement in his symptoms. Recent Dopplers performed 01/02/15 revealed what  appears to be recurrent stenosis in his right common femoral artery with disease in his right common and external iliac artery. He denies symptoms on the right side and has mild symptoms on the left which do not sound like claudication.   Hyperlipidemia with target LDL less than 70 History of hyperlipidemia not on statin drugs. His most recent lipid profile performed 06/17/13 revealed total cholesterol 160, LDL 65 and HDL 65   HTN (hypertension) History of hypertension blood pressure measured today at 188/66. His only anti-hypertensive medication is metoprolol. I'm going add low-dose amlodipine and will have him follow-up in our clinic in one month to reassess his blood pressure   Carotid artery disease History of carotid artery disease with recent Dopplers performed several weeks ago that showed moderate bilateral ICA stenosis. We will repeat this in 6 months.   CAD (coronary artery disease) History of CAD with cath performed in 1999 revealing noncritical CAD he was [facility] because of chest pain and ruled out for myocardial infarction. A Myoview stress test showed no evidence of ischemia. He's had no recurrent symptoms.       Runell GessJonathan J. Alfrieda Tarry MD FACP,FACC,FAHA, [facility] 01/17/2015 4:50 PM

## 2015-01-17 NOTE — Assessment & Plan Note (Signed)
History of carotid artery disease with recent Dopplers performed several weeks ago that showed moderate bilateral ICA stenosis. We will repeat this in 6 months.

## 2015-01-18 ENCOUNTER — Telehealth: Payer: Self-pay | Admitting: Cardiovascular Disease

## 2015-01-18 NOTE — Telephone Encounter (Signed)
Spoke to Tesoro CorporationFaye Shappell Per office note 01/17/15, amlodipine is in addition to taking metoprolol - take both  Mrs Levander CampionDraughn wanted to know if blood preesure had to be rechecked on 02/16/15 by primary or can patient wait until 02/26/15 - when regular schedule appointment with Dr Cyndia BentBadger. RN informed it will be okay to wait until 02/26/15- - send results- verbalized understanding.

## 2015-01-18 NOTE — Telephone Encounter (Signed)
Dr Allyson SabalBerry put  him on Amlodipine yesterday and he will start today. Will he still need to take his Metoprolol?

## 2015-01-25 ENCOUNTER — Encounter (HOSPITAL_COMMUNITY): Payer: Self-pay | Admitting: *Deleted

## 2015-01-25 ENCOUNTER — Inpatient Hospital Stay (HOSPITAL_COMMUNITY)
Admission: EM | Admit: 2015-01-25 | Discharge: 2015-01-29 | DRG: 194 | Disposition: A | Discharge status: Home Health | Payer: Medicare Other | Attending: Internal Medicine | Admitting: Internal Medicine

## 2015-01-25 ENCOUNTER — Emergency Department (HOSPITAL_COMMUNITY): Payer: Medicare Other

## 2015-01-25 DIAGNOSIS — N183 Chronic kidney disease, stage 3 (moderate): Secondary | ICD-10-CM | POA: Diagnosis present

## 2015-01-25 DIAGNOSIS — Z823 Family history of stroke: Secondary | ICD-10-CM | POA: Diagnosis not present

## 2015-01-25 DIAGNOSIS — R0989 Other specified symptoms and signs involving the circulatory and respiratory systems: Secondary | ICD-10-CM

## 2015-01-25 DIAGNOSIS — R0602 Shortness of breath: Secondary | ICD-10-CM | POA: Diagnosis present

## 2015-01-25 DIAGNOSIS — M25552 Pain in left hip: Secondary | ICD-10-CM | POA: Diagnosis present

## 2015-01-25 DIAGNOSIS — F329 Major depressive disorder, single episode, unspecified: Secondary | ICD-10-CM | POA: Diagnosis present

## 2015-01-25 DIAGNOSIS — M549 Dorsalgia, unspecified: Secondary | ICD-10-CM | POA: Diagnosis present

## 2015-01-25 DIAGNOSIS — Y95 Nosocomial condition: Secondary | ICD-10-CM | POA: Diagnosis present

## 2015-01-25 DIAGNOSIS — I472 Ventricular tachycardia: Secondary | ICD-10-CM | POA: Diagnosis present

## 2015-01-25 DIAGNOSIS — I129 Hypertensive chronic kidney disease with stage 1 through stage 4 chronic kidney disease, or unspecified chronic kidney disease: Secondary | ICD-10-CM | POA: Diagnosis present

## 2015-01-25 DIAGNOSIS — Z87891 Personal history of nicotine dependence: Secondary | ICD-10-CM

## 2015-01-25 DIAGNOSIS — D509 Iron deficiency anemia, unspecified: Secondary | ICD-10-CM | POA: Diagnosis present

## 2015-01-25 DIAGNOSIS — E860 Dehydration: Secondary | ICD-10-CM | POA: Diagnosis present

## 2015-01-25 DIAGNOSIS — E785 Hyperlipidemia, unspecified: Secondary | ICD-10-CM | POA: Diagnosis present

## 2015-01-25 DIAGNOSIS — Z681 Body mass index (BMI) 19 or less, adult: Secondary | ICD-10-CM

## 2015-01-25 DIAGNOSIS — Z9981 Dependence on supplemental oxygen: Secondary | ICD-10-CM

## 2015-01-25 DIAGNOSIS — E870 Hyperosmolality and hypernatremia: Secondary | ICD-10-CM | POA: Diagnosis present

## 2015-01-25 DIAGNOSIS — W19XXXA Unspecified fall, initial encounter: Secondary | ICD-10-CM | POA: Diagnosis present

## 2015-01-25 DIAGNOSIS — D539 Nutritional anemia, unspecified: Secondary | ICD-10-CM | POA: Diagnosis present

## 2015-01-25 DIAGNOSIS — I2510 Atherosclerotic heart disease of native coronary artery without angina pectoris: Secondary | ICD-10-CM | POA: Diagnosis present

## 2015-01-25 DIAGNOSIS — J9610 Chronic respiratory failure, unspecified whether with hypoxia or hypercapnia: Secondary | ICD-10-CM | POA: Diagnosis present

## 2015-01-25 DIAGNOSIS — E538 Deficiency of other specified B group vitamins: Secondary | ICD-10-CM | POA: Diagnosis present

## 2015-01-25 DIAGNOSIS — K219 Gastro-esophageal reflux disease without esophagitis: Secondary | ICD-10-CM | POA: Diagnosis present

## 2015-01-25 DIAGNOSIS — E46 Unspecified protein-calorie malnutrition: Secondary | ICD-10-CM | POA: Diagnosis present

## 2015-01-25 DIAGNOSIS — J449 Chronic obstructive pulmonary disease, unspecified: Secondary | ICD-10-CM | POA: Diagnosis present

## 2015-01-25 DIAGNOSIS — Z7951 Long term (current) use of inhaled steroids: Secondary | ICD-10-CM

## 2015-01-25 DIAGNOSIS — R52 Pain, unspecified: Secondary | ICD-10-CM

## 2015-01-25 DIAGNOSIS — G4700 Insomnia, unspecified: Secondary | ICD-10-CM | POA: Diagnosis present

## 2015-01-25 DIAGNOSIS — Z7982 Long term (current) use of aspirin: Secondary | ICD-10-CM

## 2015-01-25 DIAGNOSIS — R296 Repeated falls: Secondary | ICD-10-CM | POA: Diagnosis present

## 2015-01-25 DIAGNOSIS — N179 Acute kidney failure, unspecified: Secondary | ICD-10-CM | POA: Diagnosis present

## 2015-01-25 DIAGNOSIS — Z23 Encounter for immunization: Secondary | ICD-10-CM

## 2015-01-25 DIAGNOSIS — Z8673 Personal history of transient ischemic attack (TIA), and cerebral infarction without residual deficits: Secondary | ICD-10-CM

## 2015-01-25 DIAGNOSIS — Z8249 Family history of ischemic heart disease and other diseases of the circulatory system: Secondary | ICD-10-CM

## 2015-01-25 DIAGNOSIS — J189 Pneumonia, unspecified organism: Principal | ICD-10-CM | POA: Diagnosis present

## 2015-01-25 LAB — CBC
HEMATOCRIT: 28.9 % — AB (ref 39.0–52.0)
HEMOGLOBIN: 9 g/dL — AB (ref 13.0–17.0)
MCH: 30 pg (ref 26.0–34.0)
MCHC: 31.1 g/dL (ref 30.0–36.0)
MCV: 96.3 fL (ref 78.0–100.0)
PLATELETS: 180 10*3/uL (ref 150–400)
RBC: 3 MIL/uL — AB (ref 4.22–5.81)
RDW: 14.9 % (ref 11.5–15.5)
WBC: 5.9 10*3/uL (ref 4.0–10.5)

## 2015-01-25 LAB — BASIC METABOLIC PANEL
Anion gap: 11 (ref 5–15)
BUN: 26 mg/dL — ABNORMAL HIGH (ref 6–20)
CO2: 23 mmol/L (ref 22–32)
Calcium: 8.8 mg/dL — ABNORMAL LOW (ref 8.9–10.3)
Chloride: 112 mmol/L — ABNORMAL HIGH (ref 101–111)
Creatinine, Ser: 1.56 mg/dL — ABNORMAL HIGH (ref 0.61–1.24)
GFR calc Af Amer: 45 mL/min — ABNORMAL LOW (ref >60)
GFR calc non Af Amer: 39 mL/min — ABNORMAL LOW (ref >60)
Glucose, Bld: 91 mg/dL (ref 65–99)
Potassium: 3.9 mmol/L (ref 3.5–5.1)
Sodium: 146 mmol/L — ABNORMAL HIGH (ref 135–145)

## 2015-01-25 LAB — URINALYSIS, ROUTINE W REFLEX MICROSCOPIC
Glucose, UA: NEGATIVE mg/dL
Hgb urine dipstick: NEGATIVE
Ketones, ur: 15 mg/dL — AB
Leukocytes, UA: NEGATIVE
Nitrite: NEGATIVE
PH: 5 (ref 5.0–8.0)
PROTEIN: NEGATIVE mg/dL
Specific Gravity, Urine: 1.02 (ref 1.005–1.030)
Urobilinogen, UA: 0.2 mg/dL (ref 0.0–1.0)

## 2015-01-25 LAB — I-STAT TROPONIN, ED: Troponin i, poc: 0 ng/mL (ref 0.00–0.08)

## 2015-01-25 LAB — BRAIN NATRIURETIC PEPTIDE: B Natriuretic Peptide: 214.1 pg/mL — ABNORMAL HIGH (ref 0.0–100.0)

## 2015-01-25 MED ORDER — HEPARIN SODIUM (PORCINE) 5000 UNIT/ML IJ SOLN
5000.0000 [IU] | Freq: Three times a day (TID) | INTRAMUSCULAR | Status: DC
  Administered 2015-01-26 – 2015-01-29 (×8): 5000 [IU] via SUBCUTANEOUS
  Filled 2015-01-25 (×12): qty 1

## 2015-01-25 MED ORDER — TIOTROPIUM BROMIDE MONOHYDRATE 18 MCG IN CAPS
18.0000 ug | ORAL_CAPSULE | Freq: Every day | RESPIRATORY_TRACT | Status: DC
  Administered 2015-01-27 – 2015-01-29 (×3): 18 ug via RESPIRATORY_TRACT
  Filled 2015-01-25: qty 5

## 2015-01-25 MED ORDER — ZOLPIDEM TARTRATE 5 MG PO TABS
5.0000 mg | ORAL_TABLET | Freq: Every day | ORAL | Status: DC
  Administered 2015-01-26 – 2015-01-28 (×4): 5 mg via ORAL
  Filled 2015-01-25 (×4): qty 1

## 2015-01-25 MED ORDER — METHYLPREDNISOLONE SODIUM SUCC 125 MG IJ SOLR
125.0000 mg | Freq: Once | INTRAMUSCULAR | Status: AC
  Administered 2015-01-25: 125 mg via INTRAVENOUS
  Filled 2015-01-25: qty 2

## 2015-01-25 MED ORDER — IPRATROPIUM-ALBUTEROL 0.5-2.5 (3) MG/3ML IN SOLN
3.0000 mL | Freq: Once | RESPIRATORY_TRACT | Status: AC
  Administered 2015-01-25: 3 mL via RESPIRATORY_TRACT
  Filled 2015-01-25: qty 3

## 2015-01-25 MED ORDER — IPRATROPIUM-ALBUTEROL 0.5-2.5 (3) MG/3ML IN SOLN: 3.0000 mL | RESPIRATORY_TRACT | Status: DC | PRN

## 2015-01-25 MED ORDER — PANTOPRAZOLE SODIUM 40 MG PO TBEC
40.0000 mg | DELAYED_RELEASE_TABLET | Freq: Two times a day (BID) | ORAL | Status: DC
  Administered 2015-01-26 – 2015-01-29 (×8): 40 mg via ORAL
  Filled 2015-01-25 (×8): qty 1

## 2015-01-25 MED ORDER — LEVOFLOXACIN IN D5W 750 MG/150ML IV SOLN: 750.0000 mg | INTRAVENOUS | Status: DC

## 2015-01-25 MED ORDER — LEVOFLOXACIN IN D5W 750 MG/150ML IV SOLN
750.0000 mg | Freq: Once | INTRAVENOUS | Status: AC
  Administered 2015-01-25: 750 mg via INTRAVENOUS
  Filled 2015-01-25: qty 150

## 2015-01-25 MED ORDER — BUDESONIDE-FORMOTEROL FUMARATE 160-4.5 MCG/ACT IN AERO
2.0000 | INHALATION_SPRAY | Freq: Two times a day (BID) | RESPIRATORY_TRACT | Status: DC
  Administered 2015-01-26 – 2015-01-28 (×5): 2 via RESPIRATORY_TRACT
  Filled 2015-01-25: qty 6

## 2015-01-25 MED ORDER — ASPIRIN EC 81 MG PO TBEC
81.0000 mg | DELAYED_RELEASE_TABLET | Freq: Every day | ORAL | Status: DC
  Administered 2015-01-26 – 2015-01-28 (×4): 81 mg via ORAL
  Filled 2015-01-25 (×5): qty 1

## 2015-01-25 MED ORDER — MIRTAZAPINE 30 MG PO TABS
30.0000 mg | ORAL_TABLET | Freq: Every day | ORAL | Status: DC
  Administered 2015-01-26 – 2015-01-28 (×4): 30 mg via ORAL
  Filled 2015-01-25 (×5): qty 1

## 2015-01-25 MED ORDER — SODIUM CHLORIDE 0.45 % IV SOLN
INTRAVENOUS | Status: DC
  Administered 2015-01-25: via INTRAVENOUS

## 2015-01-25 NOTE — ED Provider Notes (Signed)
CSN: [id number]     Arrival date & time 01/25/15  1647 History   First MD Initiated Contact with Patient 01/25/15 1649     Chief Complaint  Patient presents with  . Fall  . Shortness of Breath     (Consider location/radiation/quality/duration/timing/severity/associated sxs/prior Treatment) HPI Comments: Patient was sent to the emergency department from his doctor's office. Patient reportedly had a fall 4 days ago. Since then he has been having pain in his back all on the left side. He reports the pain goes all the way down to his "hip bone". He did hit his head when he fell, but does not have a headache. Patient was seen in the ER today because he is experiencing shortness of breath and weakness. He was sent to the ER for evaluation of possible pneumonia.  Patient is a 81 y.o. male presenting with fall and shortness of breath.  Fall Associated symptoms include shortness of breath.  Shortness of Breath   Past Medical History  Diagnosis Date  . COPD (chronic obstructive pulmonary disease)   . CAD (coronary artery disease)     a.  nonobstructive CAD by cath 1999. b. Myoview 04/2012: fixed inferior and inferoseptal bowel attenuation artifact, no reversible ischemia. EF 62%. Low risk scan.  Marland Kitchen HTN (hypertension)   . PUD (peptic ulcer disease)     a. s/p surgery 1976.  Marland Kitchen Syncope     2012  . Carotid artery disease 03/22/13    a. 50-69% BICA by duplex 03/2013, repeat needed 11/2013.  Marland Kitchen PAD (peripheral artery disease)     a. prior stenting of RCIA 1999. b. history of 90% vertebral artery narrowing, 40% subclavian artery narrowing. c. Diagnostic PV angio 08/2013 for progressive claudication - will ultimately need endarterectomy/patch angio on bilat CFA stenosis; will also need atherectomy/PT/stenting to REIA.   . CKD (chronic kidney disease), stage III   . HLD (hyperlipidemia)   . Chronic respiratory failure     a. On home O2.  Marland Kitchen NSVT (nonsustained ventricular tachycardia)     a. Brief NSVT  (6b) during 08/2013 adm.  . Stroke   . Shortness of breath   . On supplemental oxygen therapy     2l   Past Surgical History  Procedure Laterality Date  . Cataract extraction    . Gastrectomy    . Transurethral resection of prostate    . Coronary angioplasty with stent placement      stent in 2003; history of right common iliac artery stent hx  . Endarterectomy femoral Bilateral 11/11/2013    Procedure: BILATERAL FEMORAL ENDARTERECTOMIES WITH BILATERAL PATCH ANGIOPLASTIES;  Surgeon: Nada Libman, MD;  Location: [facility] OR;  Service: Vascular;  Laterality: Bilateral;  . Lower extremity angiogram N/A 09/01/2013    Procedure: LOWER EXTREMITY ANGIOGRAM;  Surgeon: Runell Gess, MD;  Location: [facility] CATH LAB;  Service: Cardiovascular;  Laterality: N/A;   Family History  Problem Relation Age of Onset  . Hypertension    . Coronary artery disease    . Stroke Mother   . Heart disease Mother   . Hypertension Mother   . Heart attack Mother   . Heart attack Brother   . Heart disease Brother   . Heart disease Father   . Hypertension Father   . Heart attack Father    History  Substance Use Topics  . Smoking status: Former Smoker -- 1.00 packs/day for 60 years    Types: Cigarettes    Quit date: 01/06/1997  .  Smokeless tobacco: Never Used  . Alcohol Use: No     Comment: hx etoh abuse in past, daughter states quit over 20 years ago    Review of Systems  Respiratory: Positive for shortness of breath.   Musculoskeletal: Positive for back pain.  Neurological: Positive for weakness.  All other systems reviewed and are negative.     Allergies  Ferrous sulfate  Home Medications   Prior to Admission medications   Medication Sig Start Date End Date Taking? Authorizing Provider  albuterol (PROVENTIL HFA;VENTOLIN HFA) 108 (90 BASE) MCG/ACT inhaler Inhale 2 puffs into the lungs every 4 (four) hours as needed for wheezing or shortness of breath. 06/13/14  Yes Renae Fickle, MD  amLODipine  (NORVASC) 2.5 MG tablet Take 1 tablet (2.5 mg total) by mouth daily. 01/17/15  Yes Runell Gess, MD  aspirin EC 81 MG tablet Take 81 mg by mouth at bedtime.   Yes Historical Provider, MD  budesonide-formoterol (SYMBICORT) 160-4.5 MCG/ACT inhaler Inhale 2 puffs into the lungs 2 (two) times daily.    Yes Historical Provider, MD  Cholecalciferol (VITAMIN D3) 2000 UNITS TABS Take 2,000 mg by mouth at bedtime.    Yes Historical Provider, MD  diazepam (VALIUM) 5 MG tablet Take 5 mg by mouth every 8 (eight) hours as needed for anxiety (sleep).    Yes Historical Provider, MD  diclofenac (VOLTAREN) 75 MG EC tablet Take 75 mg by mouth 2 (two) times daily.  09/18/14 09/18/15 Yes Historical Provider, MD  metoprolol tartrate (LOPRESSOR) 25 MG tablet Take 25 mg by mouth 2 (two) times daily.    Yes Historical Provider, MD  mirtazapine (REMERON) 30 MG tablet Take 30 mg by mouth at bedtime.   Yes Historical Provider, MD  nitroGLYCERIN (NITROSTAT) 0.4 MG SL tablet Place 1 tablet (0.4 mg total) under the tongue every 5 (five) minutes as needed for chest pain. 06/13/14  Yes Renae Fickle, MD  OXYGEN-HELIUM IN Inhale 3 L into the lungs as needed. 2 liters   Yes Historical Provider, MD  pantoprazole (PROTONIX) 40 MG tablet Take 40 mg by mouth 2 (two) times daily.   Yes Historical Provider, MD  temazepam (RESTORIL) 30 MG capsule Take 30 mg by mouth at bedtime. for sleep 12/15/14  Yes Historical Provider, MD  tiotropium (SPIRIVA HANDIHALER) 18 MCG inhalation capsule Place 1 capsule (18 mcg total) into inhaler and inhale daily. 06/13/14   Renae Fickle, MD   BP 164/58 mmHg  Pulse 76  Temp(Src) 97.7 F (36.5 C) (Oral)  Resp 31  SpO2 97% Physical Exam  Constitutional: He is oriented to person, place, and time. He appears well-developed and well-nourished. No distress.  HENT:  Head: Normocephalic and atraumatic.  Right Ear: Hearing normal.  Left Ear: Hearing normal.  Nose: Nose normal.  Mouth/Throat: Oropharynx is  clear and moist and mucous membranes are normal.  Eyes: Conjunctivae and EOM are normal. Pupils are equal, round, and reactive to light.  Neck: Normal range of motion. Neck supple.  Cardiovascular: Regular rhythm, S1 normal and S2 normal.  Exam reveals no gallop and no friction rub.   No murmur heard. Pulmonary/Chest: Effort normal and breath sounds normal. No respiratory distress. He exhibits no tenderness.  Abdominal: Soft. Normal appearance and bowel sounds are normal. There is no hepatosplenomegaly. There is no tenderness. There is no rebound, no guarding, no tenderness at McBurney's point and negative Murphy's sign. No hernia.  Musculoskeletal: Normal range of motion.  Neurological: He is alert and oriented to person,  place, and time. He has normal strength. No cranial nerve deficit or sensory deficit. Coordination normal. GCS eye subscore is 4. GCS verbal subscore is 5. GCS motor subscore is 6.  Skin: Skin is warm, dry and intact. No rash noted. No cyanosis.  Psychiatric: He has a normal mood and affect. His speech is normal and behavior is normal. Thought content normal.  Nursing note and vitals reviewed.   ED Course  Procedures (including critical care time) Labs Review Labs Reviewed  BASIC METABOLIC PANEL - Abnormal; Notable for the following:    Sodium 146 (*)    Chloride 112 (*)    BUN 26 (*)    Creatinine, Ser 1.56 (*)    Calcium 8.8 (*)    GFR calc non Af Amer 39 (*)    GFR calc Af Amer 45 (*)    All other components within normal limits  CBC - Abnormal; Notable for the following:    RBC 3.00 (*)    Hemoglobin 9.0 (*)    HCT 28.9 (*)    All other components within normal limits  BRAIN NATRIURETIC PEPTIDE - Abnormal; Notable for the following:    B Natriuretic Peptide 214.1 (*)    All other components within normal limits  URINALYSIS, ROUTINE W REFLEX MICROSCOPIC (NOT AT [facility]) - Abnormal; Notable for the following:    Bilirubin Urine SMALL (*)    Ketones, ur 15 (*)     All other components within normal limits  I-STAT TROPOININ, ED    Imaging Review Dg Chest 2 View (if Patient Has Fever And/or Copd)  01/25/2015   CLINICAL DATA:  Increase shortness of breath with weakness and fatigue  EXAM: CHEST  2 VIEW  COMPARISON:  11/28/2014  FINDINGS: Right upper lobe airspace disease which is new. There is a background of hyperinflation and emphysematous change. Normal heart size and mediastinal contours. Surgical clips again noted at the GE junction.  IMPRESSION: 1. Right upper lobe pneumonia. 2. Emphysema.   Electronically Signed   By: Marnee Spring M.D.   On: 01/25/2015 18:11   Dg Thoracic Spine W/swimmers  01/25/2015   CLINICAL DATA:  81 year old male with a history of fall 1 week prior. Left-sided back and hip pain  EXAM: THORACIC SPINE - 2 VIEW + SWIMMERS  COMPARISON:  November 25, 2014  FINDINGS: Thoracic vertebral elements maintain relative anatomic alignment. No subluxation.  Upper thoracic (likely T4) compression fracture, unchanged from comparison plain film.  Osteopenia, somewhat limiting evaluation of the vertebral bodies. No displaced fracture or fracture line identified.  Dense atherosclerotic calcifications.  Surgical changes of the epigastric region/lower mediastinum.  Coarsened interstitial markings of the lungs.  IMPRESSION: No acute fracture or malalignment identified, though osteopenia somewhat limits evaluation.  Upper thoracic (likely T4) compression fracture, unchanged from comparison plain film from April.  Signed,  Yvone Neu. Loreta Ave, DO  [facility]  [facility]   Electronically Signed   By: Gilmer Mor D.O.   On: 01/25/2015 18:19   Dg Lumbar Spine Complete  01/25/2015   CLINICAL DATA:  Fall 1 week ago with left-sided back and hip pain.  EXAM: LUMBAR SPINE - COMPLETE 4+ VIEW  COMPARISON:  Lumbar spine MRI 02/23/2004  FINDINGS: Superior endplate deformities throughout the lumbar spine appear stable from 2005  MRI. Degenerative disc disease is advanced, with the worst disc narrowing at L4-5 and L5-S1. No evidence of acute fracture, endplate erosion, or focal bone lesion.  Osteopenia, which limits the sensitivity  of radiography.  Diffuse atherosclerotic calcification of the aorta and iliacs with a common iliac stent on the right. Surgical changes noted in the epigastrium.  IMPRESSION: 1. No acute findings. 2. Chronic/degenerative changes noted above.   Electronically Signed   By: Marnee Spring M.D.   On: 01/25/2015 18:16   Dg Hip Unilat With Pelvis 2-3 Views Left  01/25/2015   CLINICAL DATA:  Fall with left-sided hip and back pain. Initial encounter.  EXAM: LEFT HIP (WITH PELVIS) 2-3 VIEWS  COMPARISON:  None.  FINDINGS: Bones are osteopenic. No acute fracture or dislocation is identified. The bony pelvis is unremarkable. Visualized iliac arteries are heavily calcified. No bony lesions or destruction. No significant arthropathy.  IMPRESSION: No acute fracture identified.   Electronically Signed   By: Irish Lack M.D.   On: 01/25/2015 18:14     EKG Interpretation   Date/Time:  Thursday January 25 2015 17:15:14 EDT Ventricular Rate:  76 PR Interval:  173 QRS Duration: 88 QT Interval:  341 QTC Calculation: 383 R Axis:   64 Text Interpretation:  Sinus rhythm inf-lateral repolarization abnormality,  no change from prior Confirmed by POLLINA  MD, CHRISTOPHER [phone]) on  01/25/2015 5:42:58 PM      MDM   Final diagnoses:  Fall  HCAP (healthcare-associated pneumonia)    Patient presents to the ER for evaluation of generalized weakness, fall with shortness of breath. Patient reports a fall earlier in the week resulting in severe pain in his back and pelvis region. X-ray of thoracic spine, lumbosacral spine, pelvis and left hip were negative for acute fracture or abnormality. Patient reports that he has been experiencing increased shortness of breath associated with weakness. He saw his doctor today and  was referred to the ER because of concern over possible pneumonia. X-ray does confirm pneumonia. Patient was hospitalized 2 months ago. This would constitute healthcare associated pneumonia. Patient does have a history of significant COPD and oxygen dependent. He has had increased respiratory rate here in the ER, but is not hypoxic. Patient will require hospitalization for further management of his pneumonia and COPD.    Gilda Crease, MD 01/25/15 2023
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
## 2015-01-25 NOTE — ED Notes (Signed)
Internal medicine MD at bedside

## 2015-01-25 NOTE — Progress Notes (Signed)
ANTIBIOTIC CONSULT NOTE - INITIAL  Pharmacy Consult for Levaquin Indication: pneumonia  Allergies  Allergen Reactions  . Ferrous Sulfate Nausea And Vomiting    Patient Measurements: Height: 5\' 10"  (177.8 cm) Weight: 108 lb 1.6 oz (49.034 kg) (scale A) IBW/kg (Calculated) : 73  Vital Signs: Temp: 98.2 F (36.8 C) (06/09 2331) Temp Source: Oral (06/09 2331) BP: 134/61 mmHg (06/09 2331) Pulse Rate: 80 (06/09 2331)  Labs:  Recent Labs  01/25/15 1705  WBC 5.9  HGB 9.0*  PLT 180  CREATININE 1.56*   Estimated Creatinine Clearance: 24 mL/min (by C-G formula based on Cr of 1.56).  Medical History: Past Medical History  Diagnosis Date  . COPD (chronic obstructive pulmonary disease)   . CAD (coronary artery disease)     a.  nonobstructive CAD by cath 1999. b. Myoview 04/2012: fixed inferior and inferoseptal bowel attenuation artifact, no reversible ischemia. EF 62%. Low risk scan.  Marland Kitchen HTN (hypertension)   . PUD (peptic ulcer disease)     a. s/p surgery 1976.  Marland Kitchen Syncope     2012  . Carotid artery disease 03/22/13    a. 50-69% BICA by duplex 03/2013, repeat needed 11/2013.  Marland Kitchen PAD (peripheral artery disease)     a. prior stenting of RCIA 1999. b. history of 90% vertebral artery narrowing, 40% subclavian artery narrowing. c. Diagnostic PV angio 08/2013 for progressive claudication - will ultimately need endarterectomy/patch angio on bilat CFA stenosis; will also need atherectomy/PT/stenting to REIA.   . CKD (chronic kidney disease), stage III   . HLD (hyperlipidemia)   . Chronic respiratory failure     a. On home O2.  Marland Kitchen NSVT (nonsustained ventricular tachycardia)     a. Brief NSVT (6b) during 08/2013 adm.  . Stroke   . Shortness of breath   . On supplemental oxygen therapy     2l    Assessment: 81 y/o M here with shortness of breath/fall, CXR with likely RUL PNA, WBC WNL, afebrile, renal function requiring dosage adjustment for Levaquin, other labs as above.   Plan:   -Levaquin 750 mg IV q48h -Trend WBC, temp, renal function   Alec Walker 01/25/2015,11:45 PM

## 2015-01-25 NOTE — ED Notes (Signed)
Patient transported to X-ray 

## 2015-01-25 NOTE — ED Notes (Signed)
Pt presents from MD office via [facility] for increasing SOB and weakness.  Pt reports falling about a week ago after becoming dizzy, now complaining of left lower back pain, also reports hitting his head.  Pt was able to get up and ambulate after fall.  Per report MD sends to ED for possible PNA.  Pt also reports losing weight recently.  Pt hx COPD on 3L home O2.  Pt a x 4, NAD.  EMS reports wife living with pt has recently been sick.  BP-143/106 P-83 R-16 O2-100% 3L.

## 2015-01-25 NOTE — ED Notes (Signed)
Pt is aware urine is needed for testing, urinal at bedside. 

## 2015-01-25 NOTE — Progress Notes (Signed)
Received pt report from Jessica, RN-ED. 

## 2015-01-26 ENCOUNTER — Inpatient Hospital Stay (HOSPITAL_COMMUNITY): Payer: Medicare Other

## 2015-01-26 DIAGNOSIS — J449 Chronic obstructive pulmonary disease, unspecified: Secondary | ICD-10-CM

## 2015-01-26 DIAGNOSIS — J189 Pneumonia, unspecified organism: Principal | ICD-10-CM

## 2015-01-26 DIAGNOSIS — Z9981 Dependence on supplemental oxygen: Secondary | ICD-10-CM

## 2015-01-26 DIAGNOSIS — I2510 Atherosclerotic heart disease of native coronary artery without angina pectoris: Secondary | ICD-10-CM

## 2015-01-26 DIAGNOSIS — R0989 Other specified symptoms and signs involving the circulatory and respiratory systems: Secondary | ICD-10-CM

## 2015-01-26 DIAGNOSIS — N179 Acute kidney failure, unspecified: Secondary | ICD-10-CM

## 2015-01-26 DIAGNOSIS — I129 Hypertensive chronic kidney disease with stage 1 through stage 4 chronic kidney disease, or unspecified chronic kidney disease: Secondary | ICD-10-CM

## 2015-01-26 DIAGNOSIS — F329 Major depressive disorder, single episode, unspecified: Secondary | ICD-10-CM

## 2015-01-26 DIAGNOSIS — Y95 Nosocomial condition: Secondary | ICD-10-CM

## 2015-01-26 DIAGNOSIS — N189 Chronic kidney disease, unspecified: Secondary | ICD-10-CM

## 2015-01-26 DIAGNOSIS — R296 Repeated falls: Secondary | ICD-10-CM

## 2015-01-26 DIAGNOSIS — D509 Iron deficiency anemia, unspecified: Secondary | ICD-10-CM

## 2015-01-26 DIAGNOSIS — Z87891 Personal history of nicotine dependence: Secondary | ICD-10-CM

## 2015-01-26 DIAGNOSIS — K219 Gastro-esophageal reflux disease without esophagitis: Secondary | ICD-10-CM

## 2015-01-26 DIAGNOSIS — Z7982 Long term (current) use of aspirin: Secondary | ICD-10-CM

## 2015-01-26 LAB — COMPREHENSIVE METABOLIC PANEL
ALK PHOS: 64 U/L (ref 38–126)
ALT: 11 U/L — AB (ref 17–63)
ANION GAP: 11 (ref 5–15)
AST: 10 U/L — ABNORMAL LOW (ref 15–41)
Albumin: 2.2 g/dL — ABNORMAL LOW (ref 3.5–5.0)
BUN: 25 mg/dL — ABNORMAL HIGH (ref 6–20)
CHLORIDE: 109 mmol/L (ref 101–111)
CO2: 24 mmol/L (ref 22–32)
CREATININE: 1.29 mg/dL — AB (ref 0.61–1.24)
Calcium: 8.6 mg/dL — ABNORMAL LOW (ref 8.9–10.3)
GFR calc Af Amer: 57 mL/min — ABNORMAL LOW (ref >60)
GFR calc non Af Amer: 49 mL/min — ABNORMAL LOW (ref >60)
GLUCOSE: 191 mg/dL — AB (ref 65–99)
POTASSIUM: 4 mmol/L (ref 3.5–5.1)
Sodium: 144 mmol/L (ref 135–145)
Total Bilirubin: 0.5 mg/dL (ref 0.3–1.2)
Total Protein: 5.3 g/dL — ABNORMAL LOW (ref 6.5–8.1)

## 2015-01-26 LAB — BASIC METABOLIC PANEL
ANION GAP: 11 (ref 5–15)
BUN: 28 mg/dL — ABNORMAL HIGH (ref 6–20)
CALCIUM: 8.9 mg/dL (ref 8.9–10.3)
CO2: 20 mmol/L — ABNORMAL LOW (ref 22–32)
Chloride: 111 mmol/L (ref 101–111)
Creatinine, Ser: 1.25 mg/dL — ABNORMAL HIGH (ref 0.61–1.24)
GFR, EST AFRICAN AMERICAN: 59 mL/min — AB (ref >60)
GFR, EST NON AFRICAN AMERICAN: 51 mL/min — AB (ref >60)
GLUCOSE: 154 mg/dL — AB (ref 65–99)
Potassium: 4.2 mmol/L (ref 3.5–5.1)
Sodium: 142 mmol/L (ref 135–145)

## 2015-01-26 LAB — MRSA PCR SCREENING: MRSA BY PCR: POSITIVE — AB

## 2015-01-26 MED ORDER — ENSURE ENLIVE PO LIQD
237.0000 mL | ORAL | Status: DC
  Administered 2015-01-26: 237 mL via ORAL

## 2015-01-26 MED ORDER — PNEUMOCOCCAL VAC POLYVALENT 25 MCG/0.5ML IJ INJ
0.5000 mL | INJECTION | INTRAMUSCULAR | Status: AC
  Administered 2015-01-27: 0.5 mL via INTRAMUSCULAR
  Filled 2015-01-26: qty 0.5

## 2015-01-26 MED ORDER — FERROUS GLUCONATE 324 (38 FE) MG PO TABS
324.0000 mg | ORAL_TABLET | Freq: Every day | ORAL | Status: DC
Start: 2015-01-26 — End: 2015-01-29
  Administered 2015-01-26 – 2015-01-29 (×4): 324 mg via ORAL
  Filled 2015-01-26 (×4): qty 1

## 2015-01-26 MED ORDER — VANCOMYCIN HCL 500 MG IV SOLR
500.0000 mg | INTRAVENOUS | Status: DC
  Administered 2015-01-27: 500 mg via INTRAVENOUS
  Filled 2015-01-26 (×2): qty 500

## 2015-01-26 MED ORDER — CHLORHEXIDINE GLUCONATE CLOTH 2 % EX PADS
6.0000 | MEDICATED_PAD | Freq: Every day | CUTANEOUS | Status: DC
  Administered 2015-01-26 – 2015-01-29 (×3): 6 via TOPICAL

## 2015-01-26 MED ORDER — PIPERACILLIN-TAZOBACTAM 3.375 G IVPB
3.3750 g | Freq: Three times a day (TID) | INTRAVENOUS | Status: DC
  Administered 2015-01-26 – 2015-01-27 (×5): 3.375 g via INTRAVENOUS
  Filled 2015-01-26 (×8): qty 50

## 2015-01-26 MED ORDER — SODIUM CHLORIDE 0.9 % IV SOLN
INTRAVENOUS | Status: AC
  Administered 2015-01-26: 11:00:00 via INTRAVENOUS

## 2015-01-26 MED ORDER — VANCOMYCIN HCL IN DEXTROSE 750-5 MG/150ML-% IV SOLN
750.0000 mg | Freq: Once | INTRAVENOUS | Status: AC
  Administered 2015-01-26: 750 mg via INTRAVENOUS
  Filled 2015-01-26: qty 150

## 2015-01-26 MED ORDER — MUPIROCIN 2 % EX OINT
1.0000 "application " | TOPICAL_OINTMENT | Freq: Two times a day (BID) | CUTANEOUS | Status: DC
  Administered 2015-01-26 – 2015-01-29 (×7): 1 via NASAL
  Filled 2015-01-26: qty 22

## 2015-01-26 NOTE — Progress Notes (Addendum)
Initial Nutrition Assessment  DOCUMENTATION CODES:  Non-severe (moderate) malnutrition in context of chronic illness, Underweight  INTERVENTION:  Ensure Enlive (each supplement provides 350kcal and 20 grams of protein), Snacks  NUTRITION DIAGNOSIS:  Malnutrition related to chronic illness as evidenced by moderate depletion of body fat, severe depletion of muscle mass.   GOAL:  Patient will meet greater than or equal to 90% of their needs   MONITOR:  PO intake, Labs, Weight trends, Skin  REASON FOR ASSESSMENT:  Malnutrition Screening Tool    ASSESSMENT: 81 y/o M w/ PMHx of COPD on 3L home O2, CAD, HTN, and CKD who presents with SOB. Pt fell on his left side while walking 4 days ago. States he has been "staggering" more lately and uses a cane and walker to ambulate. He blacked out for 4-5 mins and was able to get up on his own afterwards. He blacks out 2-3 times a weeks and describes episodes as fading and and out. Xray of the lumbar spine did reveal a T4 compression fracture that is unchanged from April.  Pt states he has been eating less due to a decreased appetite for the past year which has caused weight loss. He estimates he has been eating about 50% less than he used to. He has nutritional supplements at home but, doesn't like them much. He is agreeable to trying the new version of Ensure, Enlive. Weight history shows pt has lost 10 lbs in the past 8 months- 8% weight loss. RD encouraged snacking in between meals to prevent further weight loss; he is agreeable to receiving snacks during admission. Per physical exam, pt has severe muscle wasting and moderate wasting of fat mass. Per nursing notes, pt consumed 75% of breakfast this morning.   Labs: low calcium, low hemoglobin  Height:  Ht Readings from Last 1 Encounters:  01/25/15 5\' 10"  (1.778 m)    Weight:  Wt Readings from Last 1 Encounters:  01/25/15 108 lb 1.6 oz (49.034 kg)    Ideal Body Weight:  75.5 kg  Wt  Readings from Last 10 Encounters:  01/25/15 108 lb 1.6 oz (49.034 kg)  11/28/14 114 lb (51.71 kg)  06/12/14 118 lb 6.2 oz (53.7 kg)  03/27/14 119 lb 6.4 oz (54.159 kg)  11/28/13 124 lb 8 oz (56.473 kg)  11/11/13 121 lb 11.1 oz (55.2 kg)  11/07/13 121 lb 9.6 oz (55.157 kg)  10/31/13 122 lb 9.6 oz (55.611 kg)  10/10/13 117 lb 8 oz (53.298 kg)  09/27/13 119 lb (53.978 kg)    BMI:  Body mass index is 15.51 kg/(m^2). (Underweight)  Estimated Nutritional Needs:  Kcal:  1500-1700  Protein:  70-80 grams  Fluid:  1.5-1.7 L/day  Skin:  Reviewed, no issues  Diet Order:  Diet Heart Room service appropriate?: Yes; Fluid consistency:: Thin  EDUCATION NEEDS:  No education needs identified at this time   Intake/Output Summary (Last 24 hours) at 01/26/15 1122 Last data filed at 01/26/15 1106  Gross per 24 hour  Intake 1711.67 ml  Output    350 ml  Net 1361.67 ml    Last BM:  PTA  Ian Malkin RD, LDN Inpatient Clinical Dietitian Pager: [phone] After Hours Pager: [phone]

## 2015-01-26 NOTE — Evaluation (Signed)
Physical Therapy Evaluation Patient Details Name: Alec Walker MRN: [id number] DOB: 04-10-1927 Today's Date: 01/26/2015   History of Present Illness  Pt is an 81 y/o M w/ PMHx of COPD on 3L home O2, CAD, HTN, and CKD who presents with SOB. Pt with synocpe and fall 3 days ago and now with pneumonia  Clinical Impression  I am very familiar with pt as I have worked with him in [facility] setting for years.  He is depressed and doesn't eat well. He has had OT etc in past and can fix the food (and has the food) but just doesn't eat regularly. He reprots if he eats one meal a day then he is doing well.  This has been ongoing for at least a year.  The [facility] has called several times to get meals on wheels to pt but they wont do services to his house????  Still dont understand this - this would be very helpful to pt as he feels it would eat it if it was delivered hot to him.  Pts O2 sats dropped today with moiblty - this is not unusual for him - he has all necessary O2 equipment at home and I have had the respiratory therapist to his house to get him the lightest weight portable O2 for him (although he doesn't take it when he goes out or walks to his mailbox).  I feel pt can return home with [facility] services.  Getting meals on wheels would help him a lot - i will even follow up on this as was involved prior.    Follow Up Recommendations Home health PT;Supervision - Intermittent    Equipment Recommendations  None recommended by PT    Recommendations for Other Services       Precautions / Restrictions Precautions Precautions: Fall Restrictions Weight Bearing Restrictions: No      Mobility  Bed Mobility Overal bed mobility: Modified Independent                Transfers Overall transfer level: Needs assistance Equipment used: Rolling walker (2 wheeled) Transfers: Sit to/from Stand Sit to Stand: Min guard            Ambulation/Gait Ambulation/Gait assistance: Min guard Ambulation Distance  (Feet): 120 Feet Assistive device: Rolling walker (2 wheeled) Gait Pattern/deviations: Step-through pattern;Trunk flexed     General Gait Details: pt reports his left hip felt tired during the walk.  pt reminded to focus on breathing when walking.  pt used RW for Wellsite geologist    Modified Rankin (Stroke Patients Only)       Balance Overall balance assessment: No apparent balance deficits (not formally assessed);History of Falls (pt reprots his fall was from getting weak feeling - he says from not eating well)                                           Pertinent Vitals/Pain Pain Assessment: No/denies pain    Home Living Family/patient expects to be discharged to:: Private residence Living Arrangements: Spouse/significant other Available Help at Discharge: Family;Available PRN/intermittently Type of Home: House Home Access: Stairs to enter Entrance Stairs-Rails: Right Entrance Stairs-Number of Steps: 7 Home Layout: One level Home Equipment: Walker - 2 wheels;Cane - single point (home O2) Additional Comments: pt lives with wife -she is independnt  with ADLs etc but she stays up all night on computer and sleeps all day.  pt on his own for meals -pt and wife dont eat same food    Prior Function Level of Independence: Independent         Comments: Drives and does cooking at home - but he says it tires him out and then he doesnt eat     Hand Dominance        Extremity/Trunk Assessment               Lower Extremity Assessment: Generalized weakness      Cervical / Trunk Assessment: Kyphotic  Communication   Communication: HOH  Cognition Arousal/Alertness: Awake/alert Behavior During Therapy: WFL for tasks assessed/performed                        General Comments      Exercises        Assessment/Plan    PT Assessment Patient needs continued PT services  PT Diagnosis Generalized  weakness   PT Problem List Decreased strength;Decreased activity tolerance;Decreased mobility;Cardiopulmonary status limiting activity  PT Treatment Interventions Gait training;Functional mobility training;Therapeutic activities;Patient/family education   PT Goals (Current goals can be found in the Care Plan section) Acute Rehab PT Goals Patient Stated Goal: to get home PT Goal Formulation: With patient Time For Goal Achievement: 02/09/15 Potential to Achieve Goals: Good    Frequency Min 3X/week   Barriers to discharge Decreased caregiver support      Co-evaluation               End of Session Equipment Utilized During Treatment: Gait belt Activity Tolerance: Patient tolerated treatment well Patient left: in chair;with call bell/phone within reach Nurse Communication: Mobility status         Time: 1225-1310 PT Time Calculation (min) (ACUTE ONLY): 45 min   Charges:   PT Evaluation $Initial PT Evaluation Tier I: 1 Procedure PT Treatments $Gait Training: 23-37 mins   PT G Codes:        Judson Roch 01/26/2015, 1:54 PM 01/26/2015   Ranae Palms, PT

## 2015-01-26 NOTE — Progress Notes (Signed)
Subjective:  States that he has fallen four times in around the last year. He feels that everything "goes out of his head" and then he will regain consciousness during these episodes. He reports a cough productive of white phlegm for the last year as well. He reports a month of subjective chills as well. He states decreased water intake at home. He denies any pain. He states feeling dizzy around the episodes of passing out. Has occasional palpitations but not related to the episodes.  Objective: Vital signs in last 24 hours: Filed Vitals:   01/25/15 2245 01/25/15 2331 01/26/15 0656 01/26/15 1359  BP: 108/60 134/61 128/63 122/75  Pulse: 78 80 71 70  Temp:  98.2 F (36.8 C) 97.8 F (36.6 C) 97.7 F (36.5 C)  TempSrc:  Oral Oral Oral  Resp: Height:   (1.778 m)    Weight:  108 lb 1.6 oz (49.034 kg)    SpO2: 95% 100% 99% 98%   Weight change:   Intake/Output Summary (Last 24 hours) at 01/26/15 1433 Last data filed at 01/26/15 1300  Gross per 24 hour  Intake 1951.67 ml  Output    350 ml  Net 1601.67 ml   Constitutional: No distress.  Cachetic HENT: Dry mucous membranes, Eyes: Conjunctivae and EOM are normal. Pupils are equal, round, and reactive to light.  Cardiovascular: RRR, No murmur heard. Pulmonary/Chest: Effort normal. No respiratory distress. He has no wheezes.Decreased breath sound on the RLL  Abdominal: Soft. Bowel sounds are normal. He exhibits no distension. There is no tenderness. No ab bruit on my exam. Musculoskeletal: He exhibits no edema or tenderness.  Skin: Skin is warm and dry.   Lab Results: Basic Metabolic Panel:  Recent Labs Lab 01/25/15 1705 01/26/15 0250  NA 146* 144  K 3.9 4.0  CL 112* 109  CO2 23 24  GLUCOSE 91 191*  BUN 26* 25*  CREATININE 1.56* 1.29*  CALCIUM 8.8* 8.6*   Liver Function Tests:  Recent Labs Lab 01/26/15 0250  AST 10*  ALT 11*  ALKPHOS 64  BILITOT 0.5  PROT 5.3*  ALBUMIN 2.2*    CBC:  Recent Labs Lab 01/25/15 1705  WBC 5.9  HGB 9.0*  HCT 28.9*  MCV 96.3  PLT 180   Urinalysis:  Recent Labs Lab 01/25/15 1938  COLORURINE YELLOW  LABSPEC 1.020  PHURINE 5.0  GLUCOSEU NEGATIVE  HGBUR NEGATIVE  BILIRUBINUR SMALL*  KETONESUR 15*  PROTEINUR NEGATIVE  UROBILINOGEN 0.2  NITRITE NEGATIVE  LEUKOCYTESUR NEGATIVE   Misc. Labs:  Micro Results: Recent Results (from the past 240 hour(s))  MRSA PCR Screening     Status: Abnormal   Collection Time: 01/25/15 11:46 PM  Result Value Ref Range Status   MRSA by PCR POSITIVE (A) NEGATIVE Final    Comment:        The GeneXpert MRSA Assay (FDA approved for NASAL specimens only), is one component of a comprehensive MRSA colonization surveillance program. It is not intended to diagnose MRSA infection nor to guide or monitor treatment for MRSA infections. RESULT CALLED TO, READ BACK BY AND VERIFIED WITH: RN Kathrynn Speed [id number]  THANEY    Studies/Results: Dg Chest 2 View (if Patient Has Fever And/or Copd)  01/25/2015   CLINICAL DATA:  Increase shortness of breath with weakness and fatigue  EXAM: CHEST  2 VIEW  COMPARISON:  11/28/2014  FINDINGS: Right upper lobe airspace disease which is new. There is a background of hyperinflation and  emphysematous change. Normal heart size and mediastinal contours. Surgical clips again noted at the GE junction.  IMPRESSION: 1. Right upper lobe pneumonia. 2. Emphysema.   Electronically Signed   By: Marnee Spring M.D.   On: 01/25/2015 18:11   Dg Thoracic Spine W/swimmers  01/25/2015   CLINICAL DATA:  81 year old male with a history of fall 1 week prior. Left-sided back and hip pain  EXAM: THORACIC SPINE - 2 VIEW + SWIMMERS  COMPARISON:  November 25, 2014  FINDINGS: Thoracic vertebral elements maintain relative anatomic alignment. No subluxation.  Upper thoracic (likely T4) compression fracture, unchanged from comparison plain film.  Osteopenia, somewhat limiting evaluation of the  vertebral bodies. No displaced fracture or fracture line identified.  Dense atherosclerotic calcifications.  Surgical changes of the epigastric region/lower mediastinum.  Coarsened interstitial markings of the lungs.  IMPRESSION: No acute fracture or malalignment identified, though osteopenia somewhat limits evaluation.  Upper thoracic (likely T4) compression fracture, unchanged from comparison plain film from April.  Signed,  Yvone Neu. Loreta Ave, DO  [facility]  [facility]   Electronically Signed   By: Gilmer Mor D.O.   On: 01/25/2015 18:19   Dg Lumbar Spine Complete  01/25/2015   CLINICAL DATA:  Fall 1 week ago with left-sided back and hip pain.  EXAM: LUMBAR SPINE - COMPLETE 4+ VIEW  COMPARISON:  Lumbar spine MRI 02/23/2004  FINDINGS: Superior endplate deformities throughout the lumbar spine appear stable from 2005 MRI. Degenerative disc disease is advanced, with the worst disc narrowing at L4-5 and L5-S1. No evidence of acute fracture, endplate erosion, or focal bone lesion.  Osteopenia, which limits the sensitivity of radiography.  Diffuse atherosclerotic calcification of the aorta and iliacs with a common iliac stent on the right. Surgical changes noted in the epigastrium.  IMPRESSION: 1. No acute findings. 2. Chronic/degenerative changes noted above.   Electronically Signed   By: Marnee Spring M.D.   On: 01/25/2015 18:16   US Abdomen Complete  01/26/2015   CLINICAL DATA:  81 year old male with left side pain and abdominal bruit. Initial encounter.  EXAM: ULTRASOUND ABDOMEN COMPLETE  COMPARISON:  Lumbar radiographs 01/25/2015.  Lumbar MRI 02/23/2004.  FINDINGS: Gallbladder: No gallstones or wall thickening visualized. No sonographic Murphy sign noted.  Common bile duct: Diameter: Not visualized due to overlying bowel gas  Liver: No intrahepatic biliary ductal dilatation. Mildly increased liver echogenicity. No discrete liver lesion.  IVC: No abnormality  visualized.  Pancreas: Obscured by bowel gas  Spleen: Size and appearance within normal limits.  Right Kidney: Length: 9.4 cm. Echogenicity within normal limits. No mass or hydronephrosis visualized.  Left Kidney: Length: 9.4 cm. Suggestion of a nonobstructing upper pole calculus measuring 5 mm (image 43. Echogenicity within normal limits. No mass or hydronephrosis visualized.  Abdominal aorta: Incompletely visualized due to overlying bowel gas, visualized portions within normal limits. No abdominal aortic aneurysm is identified. There is irregularity of the aorta compatible with atherosclerosis.  Other findings: None.  IMPRESSION: 1. Suboptimal due to excessive bowel gas. No abdominal aortic aneurysm identified. 2. No acute findings identified in the abdomen.   Electronically Signed   By: Odessa Fleming M.D.   On: 01/26/2015 07:23   Dg Hip Unilat With Pelvis 2-3 Views Left  01/25/2015   CLINICAL DATA:  Fall with left-sided hip and back pain. Initial encounter.  EXAM: LEFT HIP (WITH PELVIS) 2-3 VIEWS  COMPARISON:  None.  FINDINGS: Bones are osteopenic. No acute fracture or dislocation is identified.  The bony pelvis is unremarkable. Visualized iliac arteries are heavily calcified. No bony lesions or destruction. No significant arthropathy.  IMPRESSION: No acute fracture identified.   Electronically Signed   By: Irish Lack M.D.   On: 01/25/2015 18:14   Medications: I have reviewed the patient's current medications. Scheduled Meds: . aspirin EC  81 mg Oral QHS  . budesonide-formoterol  2 puff Inhalation BID  . Chlorhexidine Gluconate Cloth  6 each Topical Q0600  . feeding supplement (ENSURE ENLIVE)  237 mL Oral Q24H  . ferrous gluconate  324 mg Oral Daily  . heparin  5,000 Units Subcutaneous 3 times per day  . mirtazapine  30 mg Oral QHS  . mupirocin ointment  1 application Nasal BID  . pantoprazole  40 mg Oral BID  . piperacillin-tazobactam (ZOSYN)  IV  3.375 g Intravenous 3 times per day  . [START ON  01/27/2015] pneumococcal 23 valent vaccine  0.5 mL Intramuscular Tomorrow-1000  . tiotropium  18 mcg Inhalation Daily  . [START ON 01/27/2015] vancomycin  500 mg Intravenous Q24H  . zolpidem  5 mg Oral QHS   Continuous Infusions: . sodium chloride 75 mL/hr at 01/26/15 1105   PRN Meds:.ipratropium-albuterol Assessment/Plan: Active Problems:   HCAP (healthcare-associated pneumonia)  81 y/o male w/pmhx of CKD, COPD, anemia, and HTN who presents with SOB from PCP office found to have RUL PNA.   HCAP- recent hospital admission on 11/2014 for 3 days. CXR reveals RUL PNA. He was given duoneb tx, solumedrol , and started on levaquin in the ED.  - switched to Vanc+zosyn for HCAP coverage today. Also concerning for aspiration pneumonia as patient mentioned sometimes coughing/choking with swallowing.   Fall- likely combination of mechanical and orthostatic hypotensin. Several episodes of syncope over the last year. He feels dizzy, has lo PO intake, and also has weakness on his legs/balance problems. Echocardiogram from April 2016 unremarkable for valvular disease.  Plain films neg for acute fractures. - PT/OT - encouraged to drink plenty of water -Consider Holter monitor outpatient  Acute on chronic kidney disease-- Cr on admission 1.56, b/l around 1.2-1.3, now 1.29.  - 1/2 NS x 12 hrs. Switched to NS for 10 hours as he is volume depleted. - strict I/O  Hypernatremia - 146 >> 144 likely 2/2 to dehydration -improved with half normal saline. Changed to NS as above to correct volume depletion first.  - monitor bmet.  COPD-- on 3L O2 - continue albuterol, symbicort, spiriva, and proair inhalers  Iron deficiency anemia-- Anemia panel in 2014 revealed TIBC WNL (378), Iron 40, ferritin low and iron sat low. Folate in 2015 13.1. B/l hgb 11. On admission hgb 9.0. last admission in 11/2014 his hgb was 9.6.  - can start on ferrous gluconate as he has allergies to ferrous sulfate  Abdominal bruit  heard on admission, I do not hear the bruit on my exam - Abdominal ultrasound with no evidence of abdominal aortic aneurysm, though with some incomplete visualization due to overlying bowel gas.   HTN - low BP so hold home metoprolol  BID and norvasc 2.5mg   CAD-- stress test in 2013 neg for ischemia. Has moderate b/l ICA stenosis, gets carotid dopplers q6 months. LDL 65 in 2014, he is not on a statin. Follows w/ Dr. Allyson Sabal. - asa  daily  GERD-- likely uncontrolled due to vomiting and heart burn symptoms. - cont home protonix  qd  Depression - cont home mirtazapine and temazepam  Diet-- H/H DVT ppx-- hep [place]  CODE: FULL   Dispo: Disposition is deferred at this time, awaiting improvement of current medical problems.   The patient does have a current PCP Eartha Inch, MD) and does not need an [facility] hospital follow-up appointment after discharge.  The patient does not have transportation limitations that hinder transportation to clinic appointments.  .Services Needed at time of discharge: Y = Yes, Blank = No PT:   OT:   RN:   Equipment:   Other:     LOS: 1 day   Hyacinth Meeker, MD 01/26/2015, 2:33 PM

## 2015-01-26 NOTE — Plan of Care (Signed)
Problem: Phase II Progression Outcomes Goal: Wean O2 if indicated Outcome: Not Met (add Reason) Pt on home O2.

## 2015-01-26 NOTE — Progress Notes (Signed)
ANTIBIOTIC CONSULT NOTE - INITIAL  Pharmacy Consult for Vancomycin + Zosyn Indication: r/o HCAP  Allergies  Allergen Reactions  . Ferrous Sulfate Nausea And Vomiting    Patient Measurements: Height: 5\' 10"  (177.8 cm) Weight: 108 lb 1.6 oz (49.034 kg) (scale A) IBW/kg (Calculated) : 73  Vital Signs: Temp: 97.8 F (36.6 C) (06/10 0656) Temp Source: Oral (06/10 0656) BP: 128/63 mmHg (06/10 0656) Pulse Rate: 71 (06/10 0656) Intake/Output from previous day: 06/09 0701 - 06/10 0700 In: 941.7 [P.O.:240; I.V.:701.7] Out: 0  Intake/Output from this shift: Total I/O In: 770 [P.O.:360; I.V.:410] Out: 350 [Urine:350]  Labs:  Recent Labs  01/25/15 1705 01/26/15 0250  WBC 5.9  --   HGB 9.0*  --   PLT 180  --   CREATININE 1.56* 1.29*   Estimated Creatinine Clearance: 29 mL/min (by C-G formula based on Cr of 1.29). No results for input(s): VANCOTROUGH, VANCOPEAK, VANCORANDOM, GENTTROUGH, GENTPEAK, GENTRANDOM, TOBRATROUGH, TOBRAPEAK, TOBRARND, AMIKACINPEAK, AMIKACINTROU, AMIKACIN in the last 72 hours.   Microbiology: Recent Results (from the past 720 hour(s))  MRSA PCR Screening     Status: Abnormal   Collection Time: 01/25/15 11:46 PM  Result Value Ref Range Status   MRSA by PCR POSITIVE (A) NEGATIVE Final    Comment:        The GeneXpert MRSA Assay (FDA approved for NASAL specimens only), is one component of a comprehensive MRSA colonization surveillance program. It is not intended to diagnose MRSA infection nor to guide or monitor treatment for MRSA infections. RESULT CALLED TO, READ BACK BY AND VERIFIED WITH: RN Kathrynn Speed [id number] @0410  THANEY     Medical History: Past Medical History  Diagnosis Date  . COPD (chronic obstructive pulmonary disease)   . CAD (coronary artery disease)     a.  nonobstructive CAD by cath 1999. b. Myoview 04/2012: fixed inferior and inferoseptal bowel attenuation artifact, no reversible ischemia. EF 62%. Low risk scan.  Marland Kitchen HTN  (hypertension)   . PUD (peptic ulcer disease)     a. s/p surgery 1976.  Marland Kitchen Syncope     2012  . Carotid artery disease 03/22/13    a. 50-69% BICA by duplex 03/2013, repeat needed 11/2013.  Marland Kitchen PAD (peripheral artery disease)     a. prior stenting of RCIA 1999. b. history of 90% vertebral artery narrowing, 40% subclavian artery narrowing. c. Diagnostic PV angio 08/2013 for progressive claudication - will ultimately need endarterectomy/patch angio on bilat CFA stenosis; will also need atherectomy/PT/stenting to REIA.   . CKD (chronic kidney disease), stage III   . HLD (hyperlipidemia)   . Chronic respiratory failure     a. On home O2.  Marland Kitchen NSVT (nonsustained ventricular tachycardia)     a. Brief NSVT (6b) during 08/2013 adm.  . Stroke   . Shortness of breath   . On supplemental oxygen therapy     2l    Assessment: 65 YOM with hx COPD on home oxygen who presented on 6/9 with SOB. Pharmacy initially consulted to start Levaquin for PNA coverage - however now asked to broaden to Vancomycin + Zosyn this morning. SCr 1.29, CrCl~25-30 ml/min.   Goal of Therapy:  Vancomycin trough level 15-20 mcg/ml  Plan:  1. Start Vancomycin 750 mg IV x 1 followed by 500 mg IV every 24 hours 2. Start Zosyn 3.375g IV every 8 hours (infused over 4 hours) 3. Will continue to follow renal function, culture results, LOT, and antibiotic de-escalation plans   Georgina Pillion, PharmD, BCPS  Clinical Pharmacist Pager: [phone] 01/26/2015 11:35 AM
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
## 2015-01-26 NOTE — Progress Notes (Signed)
Alec Walker [id number] Admission Data: 01/26/2015 12:20 AM Attending Provider: Earl Lagos, MD RFX:[id number] C, MD Code Status: Full  Alec Walker is a 81 y.o. male patient admitted from ED:  -No acute distress noted.  -No complaints of shortness of breath.  -No complaints of chest pain.   Cardiac Monitoring: Box # 14 in place. Cardiac monitor yields:normal sinus rhythm.  Blood pressure 134/61, pulse 80, temperature 98.2 F (36.8 C), temperature source Oral, resp. rate 20, height 5\' 10"  (1.778 m), weight 49.034 kg (108 lb 1.6 oz), SpO2 100 %.   IV Fluids:  IV in place, occlusive dsg intact without redness, IV cath antecubital left, condition patent and no redness .45NaCl.   Allergies:  Ferrous sulfate  Past Medical History:   has a past medical history of COPD (chronic obstructive pulmonary disease); CAD (coronary artery disease); HTN (hypertension); PUD (peptic ulcer disease); Syncope; Carotid artery disease (03/22/13); PAD (peripheral artery disease); CKD (chronic kidney disease), stage III; HLD (hyperlipidemia); Chronic respiratory failure; NSVT (nonsustained ventricular tachycardia); Stroke; Shortness of breath; and On supplemental oxygen therapy.  Past Surgical History:   has past surgical history that includes Cataract extraction; Gastrectomy; Transurethral resection of prostate; Coronary angioplasty with stent; Endarterectomy femoral (Bilateral, 11/11/2013); and lower extremity angiogram (N/A, 09/01/2013).  Social History:   reports that he quit smoking about 18 years ago. His smoking use included Cigarettes. He has a 60 pack-year smoking history. He has never used smokeless tobacco. He reports that he does not drink alcohol or use illicit drugs.  Skin: charted on CHL  Patient/Family orientated to room. Information packet given to patient/family. Admission inpatient armband information verified with patient/family to include name and date of birth and placed on  patient arm. Side rails up x 2, fall assessment and education completed with patient/family. Patient/family able to verbalize understanding of risk associated with falls and verbalized understanding to call for assistance before getting out of bed. Call light within reach. Patient/family able to voice and demonstrate understanding of unit orientation instructions.

## 2015-01-26 NOTE — Evaluation (Signed)
Occupational Therapy Evaluation Patient Details Name: Alec Walker MRN: [id number] DOB: [birth year] Today's Date: 01/26/2015    History of Present Illness Pt is an 81 y/o M w/ PMHx of COPD on 3L home O2, CAD, HTN, and CKD who presents with SOB. Pt with synocpe and fall 3 days ago and now with pneumonia   Clinical Impression   PTA pt lived at home and was independent with ADLs. Pt limited by generalized weakness and has hx of falls. Pt requires min guard for functional mobility and would benefit from [facility] for strengthening and home safety. Pt will benefit from acute OT to progress to supervision level.      Follow Up Recommendations  Home health OT    Equipment Recommendations  None recommended by OT    Recommendations for Other Services       Precautions / Restrictions Precautions Precautions: Fall Restrictions Weight Bearing Restrictions: No      Mobility Bed Mobility Overal bed mobility: Modified Independent             General bed mobility comments: Pt sitting in recliner when OT arrived  Transfers Overall transfer level: Needs assistance Equipment used: Rolling walker (2 wheeled) Transfers: Sit to/from Stand Sit to Stand: Min guard         General transfer comment: Min guard to steady and rise         ADL Overall ADL's : Needs assistance/impaired Eating/Feeding: Independent;Sitting   Grooming: Set up;Sitting   Upper Body Bathing: Set up;Sitting   Lower Body Bathing: Min guard;Sit to/from stand   Upper Body Dressing : Set up;Sitting   Lower Body Dressing: Min guard;Sit to/from stand   Toilet Transfer: Min guard;Ambulation;RW           Functional mobility during ADLs: Min guard;Rolling walker       Vision Additional Comments: No change from baseline          Pertinent Vitals/Pain Pain Assessment: No/denies pain     Hand Dominance Right   Extremity/Trunk Assessment Upper Extremity Assessment Upper Extremity Assessment:  Generalized weakness   Lower Extremity Assessment Lower Extremity Assessment: Generalized weakness   Cervical / Trunk Assessment Cervical / Trunk Assessment: Kyphotic   Communication Communication Communication: HOH   Cognition Arousal/Alertness: Awake/alert Behavior During Therapy: WFL for tasks assessed/performed Overall Cognitive Status: Within Functional Limits for tasks assessed                                Home Living Family/patient expects to be discharged to:: Private residence Living Arrangements: Spouse/significant other Available Help at Discharge: Family;Available PRN/intermittently Type of Home: House Home Access: Stairs to enter [employer] of Steps: 7 Entrance Stairs-Rails: Right Home Layout: One level     Bathroom Shower/Tub: Walk-in shower;Door   [employer] Toilet: Standard     Home Equipment: Environmental consultant - 2 wheels;Cane - single point (home O2)   Additional Comments: pt lives with wife -she is independnt with ADLs etc but she stays up all night on computer and sleeps all day.  pt on his own for meals -pt and wife dont eat same food      Prior Functioning/Environment Level of Independence: Independent        Comments: Drives and does cooking at home - but he says it tires him out and then he doesnt eat    OT Diagnosis: Generalized weakness   OT Problem List: Decreased strength;Decreased activity tolerance;Impaired balance (  sitting and/or standing)   OT Treatment/Interventions: Self-care/ADL training;Therapeutic exercise;Energy conservation;DME and/or AE instruction;Therapeutic activities;Patient/family education;Balance training    OT Goals(Current goals can be found in the care plan section) Acute Rehab OT Goals Patient Stated Goal: to get home OT Goal Formulation: With patient Time For Goal Achievement: 02/09/15 Potential to Achieve Goals: Good ADL Goals Pt Will Perform Lower Body Bathing: with supervision;sit to/from  stand Pt Will Perform Lower Body Dressing: with supervision;sit to/from stand Pt Will Transfer to Toilet: with supervision;ambulating  OT Frequency: Min 2X/week   Barriers to D/C: Decreased caregiver support             End of Session Equipment Utilized During Treatment: Gait belt;Rolling walker  Activity Tolerance: Patient tolerated treatment well Patient left: in chair;with call bell/phone within reach   Time: 1415-1430 OT Time Calculation (min): 15 min Charges:  OT General Charges $OT Visit: 1 Procedure OT Evaluation $Initial OT Evaluation Tier I: 1 Procedure G-Codes:    Rae Lips 02/07/15, 3:00 PM   Carney Living, OTR/L Occupational Therapist [phone] (pager)

## 2015-01-26 NOTE — Progress Notes (Signed)
UR completed 

## 2015-01-27 DIAGNOSIS — J189 Pneumonia, unspecified organism: Secondary | ICD-10-CM | POA: Diagnosis not present

## 2015-01-27 LAB — CBC WITH DIFFERENTIAL/PLATELET
Basophils Absolute: 0 10*3/uL (ref 0.0–0.1)
Basophils Relative: 0 % (ref 0–1)
Eosinophils Absolute: 0.1 10*3/uL (ref 0.0–0.7)
Eosinophils Relative: 2 % (ref 0–5)
HCT: 27.4 % — ABNORMAL LOW (ref 39.0–52.0)
Hemoglobin: 8.6 g/dL — ABNORMAL LOW (ref 13.0–17.0)
LYMPHS ABS: 0.9 10*3/uL (ref 0.7–4.0)
Lymphocytes Relative: 16 % (ref 12–46)
MCH: 29.5 pg (ref 26.0–34.0)
MCHC: 31.4 g/dL (ref 30.0–36.0)
MCV: 93.8 fL (ref 78.0–100.0)
MONO ABS: 0.5 10*3/uL (ref 0.1–1.0)
Monocytes Relative: 9 % (ref 3–12)
NEUTROS ABS: 3.9 10*3/uL (ref 1.7–7.7)
NEUTROS PCT: 73 % (ref 43–77)
Platelets: 196 10*3/uL (ref 150–400)
RBC: 2.92 MIL/uL — ABNORMAL LOW (ref 4.22–5.81)
RDW: 14.2 % (ref 11.5–15.5)
WBC: 5.4 10*3/uL (ref 4.0–10.5)

## 2015-01-27 LAB — BASIC METABOLIC PANEL
Anion gap: 8 (ref 5–15)
BUN: 28 mg/dL — AB (ref 6–20)
CALCIUM: 8.8 mg/dL — AB (ref 8.9–10.3)
CHLORIDE: 112 mmol/L — AB (ref 101–111)
CO2: 24 mmol/L (ref 22–32)
Creatinine, Ser: 1.25 mg/dL — ABNORMAL HIGH (ref 0.61–1.24)
GFR calc Af Amer: 59 mL/min — ABNORMAL LOW (ref >60)
GFR, EST NON AFRICAN AMERICAN: 51 mL/min — AB (ref >60)
GLUCOSE: 100 mg/dL — AB (ref 65–99)
POTASSIUM: 3.6 mmol/L (ref 3.5–5.1)
Sodium: 144 mmol/L (ref 135–145)

## 2015-01-27 MED ORDER — BENZONATATE 100 MG PO CAPS
100.0000 mg | ORAL_CAPSULE | Freq: Two times a day (BID) | ORAL | Status: DC
  Administered 2015-01-27 – 2015-01-29 (×5): 100 mg via ORAL
  Filled 2015-01-27 (×6): qty 1

## 2015-01-27 MED ORDER — ALBUTEROL SULFATE (2.5 MG/3ML) 0.083% IN NEBU: 2.5000 mg | INHALATION_SOLUTION | RESPIRATORY_TRACT | Status: DC | PRN

## 2015-01-27 MED ORDER — SENNOSIDES-DOCUSATE SODIUM 8.6-50 MG PO TABS
1.0000 | ORAL_TABLET | Freq: Every evening | ORAL | Status: DC | PRN
  Filled 2015-01-27: qty 1

## 2015-01-27 MED ORDER — SIMETHICONE 80 MG PO CHEW
80.0000 mg | CHEWABLE_TABLET | Freq: Four times a day (QID) | ORAL | Status: DC | PRN
  Administered 2015-01-27: 80 mg via ORAL
  Filled 2015-01-27 (×2): qty 1

## 2015-01-27 MED ORDER — AMLODIPINE BESYLATE 2.5 MG PO TABS
2.5000 mg | ORAL_TABLET | Freq: Every day | ORAL | Status: DC
  Administered 2015-01-27 – 2015-01-28 (×2): 2.5 mg via ORAL
  Filled 2015-01-27 (×2): qty 1

## 2015-01-27 MED ORDER — METOPROLOL TARTRATE 25 MG PO TABS
25.0000 mg | ORAL_TABLET | Freq: Two times a day (BID) | ORAL | Status: DC
  Administered 2015-01-27 – 2015-01-29 (×5): 25 mg via ORAL
  Filled 2015-01-27 (×6): qty 1

## 2015-01-27 NOTE — Progress Notes (Signed)
Received call from central telemetry stating pt had 5 beat run [facility]. This RN and charge RN reviewed strip and did note VTACH. Pt observed resting w/ eyes closed, no s/s distresses noted. Paged on call Dr at this time regarding event and pt status.

## 2015-01-27 NOTE — Plan of Care (Signed)
Problem: Phase II Progression Outcomes Goal: Pain controlled Outcome: Progressing No c/o pain noted, no s/s/ pain observed

## 2015-01-27 NOTE — Progress Notes (Signed)
Subjective:  Pt seen and examined in AM. Pt had asymptomatic 5 beats of vtach last night. He reports feeling well with improved dyspnea and is currently on 3L [place] oxygen. He continues to have dry cough but denies fever, chills, chest pain, or lightheadedness.    Objective: Vital signs in last 24 hours: Filed Vitals:   01/26/15 1359 01/26/15 2206 01/27/15 0624 01/27/15 0750  BP: 122/75 124/67 153/68   Pulse: 70 68 69   Temp: 97.7 F (36.5 C) 97.8 F (36.6 C) 97.6 F (36.4 C)   TempSrc: Oral Oral Oral   Resp: 18 18 18    Height:      Weight:   108 lb 0.4 oz (49 kg)   SpO2: 98% 100% 100% 99%   Weight change: -1.2 oz (-0.034 kg)  Intake/Output Summary (Last 24 hours) at 01/27/15 0804 Last data filed at 01/27/15 0520  Gross per 24 hour  Intake 2028.75 ml  Output    351 ml  Net 1677.75 ml   PHYSICAL EXAMINATION:  General: Cachetic Heart: Normal rate and rhythm, soft systolic murmur present  Lungs: Bibasilar crackles (R>L) with no wheezing or ronchi Abdomen: Soft, non-tender, non-distended with normal BS Extremities: No edema   Lab Results: Basic Metabolic Panel:  Recent Labs Lab 01/26/15 1650 01/27/15 0302  NA 142 144  K 4.2 3.6  CL 111 112*  CO2 20* 24  GLUCOSE 154* 100*  BUN 28* 28*  CREATININE 1.25* 1.25*  CALCIUM 8.9 8.8*   Liver Function Tests:  Recent Labs Lab 01/26/15 0250  AST 10*  ALT 11*  ALKPHOS 64  BILITOT 0.5  PROT 5.3*  ALBUMIN 2.2*   CBC:  Recent Labs Lab 01/25/15 1705 01/27/15 0302  WBC 5.9 5.4  NEUTROABS  --  3.9  HGB 9.0* 8.6*  HCT 28.9* 27.4*  MCV 96.3 93.8  PLT 180 196   Urinalysis:  Recent Labs Lab 01/25/15 1938  COLORURINE YELLOW  LABSPEC 1.020  PHURINE 5.0  GLUCOSEU NEGATIVE  HGBUR NEGATIVE  BILIRUBINUR SMALL*  KETONESUR 15*  PROTEINUR NEGATIVE  UROBILINOGEN 0.2  NITRITE NEGATIVE  LEUKOCYTESUR NEGATIVE   Misc. Labs:  Micro Results: Recent Results (from the past 240 hour(s))  MRSA PCR Screening      Status: Abnormal   Collection Time: 01/25/15 11:46 PM  Result Value Ref Range Status   MRSA by PCR POSITIVE (A) NEGATIVE Final    Comment:        The GeneXpert MRSA Assay (FDA approved for NASAL specimens only), is one component of a comprehensive MRSA colonization surveillance program. It is not intended to diagnose MRSA infection nor to guide or monitor treatment for MRSA infections. RESULT CALLED TO, READ BACK BY AND VERIFIED WITH: RN Kathrynn Speed [id number] @0410  THANEY    Studies/Results: Dg Chest 2 View (if Patient Has Fever And/or Copd)  01/25/2015   CLINICAL DATA:  Increase shortness of breath with weakness and fatigue  EXAM: CHEST  2 VIEW  COMPARISON:  11/28/2014  FINDINGS: Right upper lobe airspace disease which is new. There is a background of hyperinflation and emphysematous change. Normal heart size and mediastinal contours. Surgical clips again noted at the GE junction.  IMPRESSION: 1. Right upper lobe pneumonia. 2. Emphysema.   Electronically Signed   By: Marnee Spring M.D.   On: 01/25/2015 18:11   Dg Thoracic Spine W/swimmers  01/25/2015   CLINICAL DATA:  81 year old male with a history of fall 1 week prior. Left-sided back and hip pain  EXAM: THORACIC SPINE - 2 VIEW + SWIMMERS  COMPARISON:  November 25, 2014  FINDINGS: Thoracic vertebral elements maintain relative anatomic alignment. No subluxation.  Upper thoracic (likely T4) compression fracture, unchanged from comparison plain film.  Osteopenia, somewhat limiting evaluation of the vertebral bodies. No displaced fracture or fracture line identified.  Dense atherosclerotic calcifications.  Surgical changes of the epigastric region/lower mediastinum.  Coarsened interstitial markings of the lungs.  IMPRESSION: No acute fracture or malalignment identified, though osteopenia somewhat limits evaluation.  Upper thoracic (likely T4) compression fracture, unchanged from comparison plain film from April.  Signed,  Yvone Neu. Loreta Ave, DO  [facility]  [facility]   Electronically Signed   By: Gilmer Mor D.O.   On: 01/25/2015 18:19   Dg Lumbar Spine Complete  01/25/2015   CLINICAL DATA:  Fall 1 week ago with left-sided back and hip pain.  EXAM: LUMBAR SPINE - COMPLETE 4+ VIEW  COMPARISON:  Lumbar spine MRI 02/23/2004  FINDINGS: Superior endplate deformities throughout the lumbar spine appear stable from 2005 MRI. Degenerative disc disease is advanced, with the worst disc narrowing at L4-5 and L5-S1. No evidence of acute fracture, endplate erosion, or focal bone lesion.  Osteopenia, which limits the sensitivity of radiography.  Diffuse atherosclerotic calcification of the aorta and iliacs with a common iliac stent on the right. Surgical changes noted in the epigastrium.  IMPRESSION: 1. No acute findings. 2. Chronic/degenerative changes noted above.   Electronically Signed   By: Marnee Spring M.D.   On: 01/25/2015 18:16   US Abdomen Complete  01/26/2015   CLINICAL DATA:  81 year old male with left side pain and abdominal bruit. Initial encounter.  EXAM: ULTRASOUND ABDOMEN COMPLETE  COMPARISON:  Lumbar radiographs 01/25/2015.  Lumbar MRI 02/23/2004.  FINDINGS: Gallbladder: No gallstones or wall thickening visualized. No sonographic Murphy sign noted.  Common bile duct: Diameter: Not visualized due to overlying bowel gas  Liver: No intrahepatic biliary ductal dilatation. Mildly increased liver echogenicity. No discrete liver lesion.  IVC: No abnormality visualized.  Pancreas: Obscured by bowel gas  Spleen: Size and appearance within normal limits.  Right Kidney: Length: 9.4 cm. Echogenicity within normal limits. No mass or hydronephrosis visualized.  Left Kidney: Length: 9.4 cm. Suggestion of a nonobstructing upper pole calculus measuring 5 mm (image 43. Echogenicity within normal limits. No mass or hydronephrosis visualized.  Abdominal aorta: Incompletely visualized due to overlying bowel gas, visualized  portions within normal limits. No abdominal aortic aneurysm is identified. There is irregularity of the aorta compatible with atherosclerosis.  Other findings: None.  IMPRESSION: 1. Suboptimal due to excessive bowel gas. No abdominal aortic aneurysm identified. 2. No acute findings identified in the abdomen.   Electronically Signed   By: Odessa Fleming M.D.   On: 01/26/2015 07:23   Dg Hip Unilat With Pelvis 2-3 Views Left  01/25/2015   CLINICAL DATA:  Fall with left-sided hip and back pain. Initial encounter.  EXAM: LEFT HIP (WITH PELVIS) 2-3 VIEWS  COMPARISON:  None.  FINDINGS: Bones are osteopenic. No acute fracture or dislocation is identified. The bony pelvis is unremarkable. Visualized iliac arteries are heavily calcified. No bony lesions or destruction. No significant arthropathy.  IMPRESSION: No acute fracture identified.   Electronically Signed   By: Irish Lack M.D.   On: 01/25/2015 18:14   Medications: I have reviewed the patient's current medications. Scheduled Meds: . aspirin EC  81 mg Oral QHS  . budesonide-formoterol  2 puff Inhalation BID  .  Chlorhexidine Gluconate Cloth  6 each Topical Q0600  . feeding supplement (ENSURE ENLIVE)  237 mL Oral Q24H  . ferrous gluconate  324 mg Oral Daily  . heparin  5,000 Units Subcutaneous 3 times per day  . mirtazapine  30 mg Oral QHS  . mupirocin ointment  1 application Nasal BID  . pantoprazole  40 mg Oral BID  . piperacillin-tazobactam (ZOSYN)  IV  3.375 g Intravenous 3 times per day  . pneumococcal 23 valent vaccine  0.5 mL Intramuscular Tomorrow-1000  . tiotropium  18 mcg Inhalation Daily  . vancomycin  500 mg Intravenous Q24H  . zolpidem  5 mg Oral QHS   Continuous Infusions:   PRN Meds:.ipratropium-albuterol, senna-docusate, simethicone Assessment/Plan: Active Problems:   HCAP (healthcare-associated pneumonia)  81 y/o male w/pmhx of CKD, COPD, anemia, and HTN who presents with SOB from PCP office found to have RUL PNA.   Right Upper  lobe HCAP vs aspiration pneumonia- Pt with improved dyspnea currently on home 3L with 99-100% SpO2. Pt with recent hospital admission on 11/2014 for 3 days and reports coughing/choking with swallowing concerning for HCAP vs  aspiration pneumonia. Chest xray with RUL PNA. -Oxygen therapy to keep SpO2 >92% (pt at home on 3 L) -No blood cultures were drawn on admission   -Obtain sputum cultures  -Continue Day 2 of IV vancomycin and zosyn, transition to PO (augmentin & doxycyline) tomm for total 7-10 days if continues to improve -Obtain PCT level  -Obtain swallow evaluation  -Ambulate tomorrow and assess oxygen requirement  -Pt needs repeat chest xray in 4-6 weeks to ensure resolution  Recurrent Falls - Etiology unclear, pt reports occasional palpitations concerning for PAF vs orthostatic hypotension in setting of malnutrition. 2D-echo on 11/27/14 with grade 1 diastolic dysfunction  with no valvular dysfunction.  -Continue to monitor on telemetry  -Obtain orthostatic vital signs -Obtain dietician consult  -Continue PT and OT, recommend home health -Consider holter monitor as outpatient  CKD Stage 3 - Cr 1.25 at baseline 1.2-1.3 with normal urine output.   -Monitor daily weights and strict I & O's -Avoid nephrotoxins  Oxygen-dependent COPD - Currently with no exacerbation. Pt at home on 3L O2.  -Continue spiriva 18 mcg daily and symbicort 2 puffs BID  -Start albuterol nebulizer Q 4 hr PRN -Start tessalon 100 mg BID for cough  Iron deficiency anemia-- Hg 8.6 below baseline  9-11 with no active bleeding. Last anemia panel in 2014 revealed IDA.  -Continue ferrous gluconate 324 mg daily (allergy to ferrous sulfate) -Obtain anemia panel and smear review  -Follow-up FOBT  Hypertension - Currently hypertensive 153/68. -Restart home metoprolol 25 mg BID and norvasc 2.5 mg daily   CAD - Pt with no anginal symptoms. Pt with stress test in 2013 that was negative for ischemia. Pt has moderate b/l ICA  stenosis and receives carotid dopplers every 6 months.  -Continue aspirin  daily -Pt not on statin therapy at home   GERD - Currently with no symptoms.  -Continue home protonix 40 mg daily   Depression - Currently with stable mood.  -Continue home mirtazapine 30 mg daily and temazepam  Diet:  Heart healthy  DVT Ppx: SQ heparin TID CODE: Full   Dispo: Disposition is deferred at this time, awaiting improvement of current medical problems.   The patient does have a current PCP Eartha Inch, MD) and does not need an [facility] hospital follow-up appointment after discharge.  The patient does not have transportation limitations that hinder  transportation to clinic appointments.  .Services Needed at time of discharge: Y = Yes, Blank = No PT:  Home health  OT:  Home health   RN:   Equipment:   Other:     LOS: 2 days   Otis Brace, MD 01/27/2015, 8:04 AM

## 2015-01-28 LAB — CBC
HEMATOCRIT: 27.8 % — AB (ref 39.0–52.0)
HEMOGLOBIN: 8.9 g/dL — AB (ref 13.0–17.0)
MCH: 30 pg (ref 26.0–34.0)
MCHC: 32 g/dL (ref 30.0–36.0)
MCV: 93.6 fL (ref 78.0–100.0)
PLATELETS: 206 10*3/uL (ref 150–400)
RBC: 2.97 MIL/uL — ABNORMAL LOW (ref 4.22–5.81)
RDW: 14.5 % (ref 11.5–15.5)
WBC: 4.2 10*3/uL (ref 4.0–10.5)

## 2015-01-28 LAB — IRON AND TIBC
Iron: 32 ug/dL — ABNORMAL LOW (ref 45–182)
Saturation Ratios: 10 % — ABNORMAL LOW (ref 17.9–39.5)
TIBC: 332 ug/dL (ref 250–450)
UIBC: 300 ug/dL

## 2015-01-28 LAB — BASIC METABOLIC PANEL
ANION GAP: 8 (ref 5–15)
BUN: 24 mg/dL — AB (ref 6–20)
CALCIUM: 8.7 mg/dL — AB (ref 8.9–10.3)
CO2: 26 mmol/L (ref 22–32)
Chloride: 107 mmol/L (ref 101–111)
Creatinine, Ser: 1.35 mg/dL — ABNORMAL HIGH (ref 0.61–1.24)
GFR calc non Af Amer: 46 mL/min — ABNORMAL LOW (ref >60)
GFR, EST AFRICAN AMERICAN: 54 mL/min — AB (ref >60)
Glucose, Bld: 96 mg/dL (ref 65–99)
Potassium: 3.6 mmol/L (ref 3.5–5.1)
Sodium: 141 mmol/L (ref 135–145)

## 2015-01-28 LAB — VITAMIN B12: Vitamin B-12: 121 pg/mL — ABNORMAL LOW (ref 180–914)

## 2015-01-28 LAB — TECHNOLOGIST SMEAR REVIEW

## 2015-01-28 LAB — RETICULOCYTES
RBC.: 2.97 MIL/uL — ABNORMAL LOW (ref 4.22–5.81)
Retic Count, Absolute: 29.7 10*3/uL (ref 19.0–186.0)
Retic Ct Pct: 1 % (ref 0.4–3.1)

## 2015-01-28 LAB — FERRITIN: Ferritin: 38 ng/mL (ref 24–336)

## 2015-01-28 LAB — PROCALCITONIN: Procalcitonin: 0.11 ng/mL

## 2015-01-28 LAB — FOLATE: Folate: 8.5 ng/mL (ref >5.9)

## 2015-01-28 MED ORDER — CYANOCOBALAMIN 1000 MCG/ML IJ SOLN
1000.0000 ug | Freq: Once | INTRAMUSCULAR | Status: AC
  Administered 2015-01-28: 1000 ug via INTRAMUSCULAR
  Filled 2015-01-28: qty 1

## 2015-01-28 MED ORDER — AMOXICILLIN-POT CLAVULANATE 500-125 MG PO TABS
1.0000 | ORAL_TABLET | Freq: Two times a day (BID) | ORAL | Status: DC
  Administered 2015-01-29: 1000 mg via ORAL
  Filled 2015-01-28 (×2): qty 1

## 2015-01-28 MED ORDER — AMOXICILLIN-POT CLAVULANATE 875-125 MG PO TABS
1.0000 | ORAL_TABLET | Freq: Two times a day (BID) | ORAL | Status: AC
  Administered 2015-01-28 (×2): 1 via ORAL
  Filled 2015-01-28 (×2): qty 1

## 2015-01-28 MED ORDER — DOXYCYCLINE HYCLATE 100 MG PO TABS
100.0000 mg | ORAL_TABLET | Freq: Two times a day (BID) | ORAL | Status: DC
  Administered 2015-01-28 – 2015-01-29 (×3): 100 mg via ORAL
  Filled 2015-01-28 (×4): qty 1

## 2015-01-28 MED ORDER — AMOXICILLIN-POT CLAVULANATE 875-125 MG PO TABS
1.0000 | ORAL_TABLET | Freq: Two times a day (BID) | ORAL | Status: DC
  Filled 2015-01-28 (×2): qty 1

## 2015-01-28 MED ORDER — AMLODIPINE BESYLATE 5 MG PO TABS
5.0000 mg | ORAL_TABLET | Freq: Every day | ORAL | Status: DC
  Administered 2015-01-29: 5 mg via ORAL
  Filled 2015-01-28: qty 1

## 2015-01-28 MED ORDER — VITAMIN B-12 1000 MCG PO TABS
1000.0000 ug | ORAL_TABLET | Freq: Every day | ORAL | Status: DC
  Administered 2015-01-29: 1000 ug via ORAL
  Filled 2015-01-28: qty 1

## 2015-01-28 NOTE — Progress Notes (Addendum)
Subjective:  Pt seen and examined in AM. He reports feeling well with improved dyspnea and is on his home 3L [place] oxygen. He continues to have productive cough but denies fever, chills, or chest pain. He has mild lightheadedness with standing when he ambulates to the bathroom.     Objective: Vital signs in last 24 hours: Filed Vitals:   01/27/15 2030 01/27/15 2134 01/28/15 0453 01/28/15 0829  BP: 127/66  160/72   Pulse: 60  62   Temp: 98.4 F (36.9 C)  97.6 F (36.4 C)   TempSrc: Oral  Oral   Resp: 18  18   Height:      Weight:   111 lb 4.8 oz (50.485 kg)   SpO2: 97% 97% 100% 98%   Weight change: 3 lb 4.4 oz (1.485 kg)  Intake/Output Summary (Last 24 hours) at 01/28/15 0917 Last data filed at 01/28/15 0855  Gross per 24 hour  Intake   1180 ml  Output    400 ml  Net    780 ml   PHYSICAL EXAMINATION:  General: Chronically ill appearing  Heart: Normal rate and rhythm, soft systolic murmur present  Lungs: Bibasilar crackles (R>L) with no wheezing or ronchi Abdomen: Soft, non-tender, non-distended with normal BS Extremities: No edema   Lab Results: Basic Metabolic Panel:  Recent Labs Lab 01/27/15 0302 01/28/15 0325  NA 144 141  K 3.6 3.6  CL 112* 107  CO2 24 26  GLUCOSE 100* 96  BUN 28* 24*  CREATININE 1.25* 1.35*  CALCIUM 8.8* 8.7*   Liver Function Tests:  Recent Labs Lab 01/26/15 0250  AST 10*  ALT 11*  ALKPHOS 64  BILITOT 0.5  PROT 5.3*  ALBUMIN 2.2*   CBC:  Recent Labs Lab 01/27/15 0302 01/28/15 0325  WBC 5.4 4.2  NEUTROABS 3.9  --   HGB 8.6* 8.9*  HCT 27.4* 27.8*  MCV 93.8 93.6  PLT 196 206   Urinalysis:  Recent Labs Lab 01/25/15 1938  COLORURINE YELLOW  LABSPEC 1.020  PHURINE 5.0  GLUCOSEU NEGATIVE  HGBUR NEGATIVE  BILIRUBINUR SMALL*  KETONESUR 15*  PROTEINUR NEGATIVE  UROBILINOGEN 0.2  NITRITE NEGATIVE  LEUKOCYTESUR NEGATIVE   Misc. Labs:  Micro Results: Recent Results (from the past 240 hour(s))  MRSA PCR  Screening     Status: Abnormal   Collection Time: 01/25/15 11:46 PM  Result Value Ref Range Status   MRSA by PCR POSITIVE (A) NEGATIVE Final    Comment:        The GeneXpert MRSA Assay (FDA approved for NASAL specimens only), is one component of a comprehensive MRSA colonization surveillance program. It is not intended to diagnose MRSA infection nor to guide or monitor treatment for MRSA infections. RESULT CALLED TO, READ BACK BY AND VERIFIED WITH: RN LIZ YNOT [phone]  THANEY    Studies/Results: No results found. Medications: I have reviewed the patient's current medications. Scheduled Meds: . amLODipine  2.5 mg Oral Daily  . [START ON 01/29/2015] amoxicillin-clavulanate  1 tablet Oral BID  . amoxicillin-clavulanate  1 tablet Oral Q12H  . aspirin EC  81 mg Oral QHS  . benzonatate  100 mg Oral BID  . budesonide-formoterol  2 puff Inhalation BID  . Chlorhexidine Gluconate Cloth  6 each Topical Q0600  . doxycycline  100 mg Oral Q12H  . feeding supplement (ENSURE ENLIVE)  237 mL Oral Q24H  . ferrous gluconate  324 mg Oral Daily  . heparin  5,000 Units Subcutaneous 3 times  per day  . metoprolol tartrate  25 mg Oral BID  . mirtazapine  30 mg Oral QHS  . mupirocin ointment  1 application Nasal BID  . pantoprazole  40 mg Oral BID  . tiotropium  18 mcg Inhalation Daily  . zolpidem  5 mg Oral QHS   Continuous Infusions:   PRN Meds:.albuterol, ipratropium-albuterol, senna-docusate, simethicone Assessment/Plan: Active Problems:   HCAP (healthcare-associated pneumonia)  81 y/o male w/pmhx of CKD, COPD, anemia, and HTN who presents with SOB from PCP office found to have RUL PNA.   Right Upper lobe HCAP vs aspiration pneumonia- Pt improved currently on home 3L with 97-100% SpO2. Pt with recent hospital admission on 11/2014 for 3 days and reports coughing/choking with swallowing concerning for HCAP vs aspiration pneumonia. Chest xray with RUL PNA. -Oxygen therapy to keep SpO2 >92%  (pt at home on 3 L) -No blood cultures were drawn on admission   -Obtain sputum cultures  -Transition IV vancomycin and zosyn to PO augmentin 875-125 mg today (500-125 mg BID) & doxycyline 100 mg BID today for total 7 days antibiotics (currently on Day 3) -Continue tessalon 100 mg BID for cough -Awaiting swallow evaluation  -Ambulate today and assess oxygen requirement  -Pt needs repeat chest xray in 4-6 weeks to ensure resolution  Recurrent Falls - Etiology unclear, possibly due to ataxia from B12 deficiency. Pt also reports occasional palpitations concerning for PAF vs orthostatic hypotension in setting of malnutrition. 2D-echo on 11/27/14 with grade 1 diastolic dysfunction  with no valvular dysfunction.  -Continue to monitor on telemetry  -Obtain orthostatic vital signs -Give IM B12 1000 mcg today  -Start PO B12 1000 mcg daily tomm -Obtain dietician consult to assess nutrition requirement  -SW consult for meals on wheels  -Continue PT and OT, recommend home health -Consider holter monitor as outpatient  CKD Stage 3 - Cr 1.35 at baseline 1.2-1.3 with normal urine output.   -Monitor daily weights and strict I & O's -Avoid nephrotoxins  Oxygen-dependent COPD - Currently with no exacerbation. Pt at home on 3L O2.  -Continue spiriva 18 mcg daily and symbicort 2 puffs BID  -Continue albuterol nebulizer Q 4 hr PRN -Continue tessalon 100 mg BID for cough  B12 Deficiency - Level low at 121. Pt with recurrent falls possibly due to ataxia.  -Give IM B12 1000 mcg today -Start PO B12 1000 mcg daily tomm -Check intrinsic factor Ab  Chronic anemia-- Hg 8.9 near baseline  9-11 with no active bleeding. Anemia panel with probable mixed IDA with ferritin 38 and macrocytic anemia with B12 deficiency.   -Continue ferrous gluconate 324 mg daily (allergy to ferrous sulfate) -Follow-up FOBT -Pt needs outpatient screening colonoscopy   Hypertension - Currently hypertensive 160/72 -Continue home  metoprolol 25 mg BID  -Increase norvasc 2.5 mg daily to 5 mg daily    CAD - Pt with no anginal symptoms. Pt with stress test in 2013 that was negative for ischemia. Pt has moderate b/l ICA stenosis and receives carotid dopplers every 6 months.  -Continue aspirin 81mg  daily -Pt not on statin therapy at home   GERD - Currently with no symptoms.  -Continue home protonix 40 mg daily   Depression - Currently with stable mood.  -Continue home mirtazapine 30 mg daily and temazepam  Insomnia - Currently stable -Ambien 5 mg PRN    Diet:  Heart healthy  DVT Ppx: SQ heparin TID CODE: Full   Dispo: 1 day   The patient does have a  current PCP Eartha Inch, MD) and does not need an [facility] hospital follow-up appointment after discharge.  The patient does not have transportation limitations that hinder transportation to clinic appointments.  .Services Needed at time of discharge: Y = Yes, Blank = No PT:  Home health  OT:  Home health   RN:   Equipment:   Other:     LOS: 3 days   Otis Brace, MD 01/28/2015, 9:17 AM

## 2015-01-29 MED ORDER — CYANOCOBALAMIN 1000 MCG PO TABS: 1000.0000 ug | ORAL_TABLET | Freq: Every day | ORAL | Status: AC

## 2015-01-29 MED ORDER — ENSURE ENLIVE PO LIQD: 237.0000 mL | ORAL | Status: DC

## 2015-01-29 MED ORDER — SENNOSIDES-DOCUSATE SODIUM 8.6-50 MG PO TABS: 1.0000 | ORAL_TABLET | Freq: Every evening | ORAL | Status: AC | PRN

## 2015-01-29 MED ORDER — AMOXICILLIN-POT CLAVULANATE 500-125 MG PO TABS: 1.0000 | ORAL_TABLET | Freq: Two times a day (BID) | ORAL | Status: AC

## 2015-01-29 MED ORDER — BENZONATATE 100 MG PO CAPS: 100.0000 mg | ORAL_CAPSULE | Freq: Two times a day (BID) | ORAL | Status: DC

## 2015-01-29 MED ORDER — SIMETHICONE 80 MG PO CHEW: 80.0000 mg | CHEWABLE_TABLET | Freq: Four times a day (QID) | ORAL | Status: AC | PRN

## 2015-01-29 MED ORDER — AMLODIPINE BESYLATE 5 MG PO TABS: 5.0000 mg | ORAL_TABLET | Freq: Every day | ORAL | Status: DC

## 2015-01-29 MED ORDER — FERROUS GLUCONATE 324 (38 FE) MG PO TABS: 324.0000 mg | ORAL_TABLET | Freq: Every day | ORAL | Status: DC

## 2015-01-29 MED ORDER — DOXYCYCLINE HYCLATE 100 MG PO TABS: 100.0000 mg | ORAL_TABLET | Freq: Two times a day (BID) | ORAL | Status: AC

## 2015-01-29 NOTE — Care Management Note (Signed)
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 Soc Worker to see also to give patient information on Assisted Living Facilities for future planning. CM asked patient about any problems getting his medication- patient denied any problems getting his medication. He has Medicare as An[employer] for prescription drug coverage. SW also gave patient information about Meals on Wheels.  Expected Discharge Date:    01/29/2015              Expected Discharge Plan:  Home w Home Health Services  In-House Referral:   SW Consult  Discharge planning Services  CM Consult :    Choice offered to:  Patient  DME Arranged:  Walker rolling DME Agency:  [facility]  HH Arranged:  PT, OT [facility] Agency:  [facility]  Status of Service:   Completed  Medicare Important Message Given:  Yes Date Medicare IM Given:  01/29/15 Medicare IM give by:  Abelino Derrick RN  Cherrie Distance, RN,BSN,MHA [phone] 01/29/2015, 9:55 AM

## 2015-01-29 NOTE — Progress Notes (Signed)
Occupational Therapy Treatment Patient Details Name: Alec Walker MRN: [id number] DOB: 01/26/1927 Today's Date: 01/29/2015    History of present illness Pt is an 81 y/o M w/ PMHx of COPD on 3L home O2, CAD, HTN, and CKD who presents with SOB. Pt with synocpe and fall 3 days ago and now with pneumonia   OT comments  Pt. Agreeable to participation in OT.  Able to return safe demo of LB ADLS with education and reinforcement of energy conservation techniques during functional tasks  Follow Up Recommendations  Home health OT    Equipment Recommendations  None recommended by OT    Recommendations for Other Services      Precautions / Restrictions Precautions Precautions: Fall Restrictions Weight Bearing Restrictions: No       Mobility Bed Mobility               General bed mobility comments: Pt sitting in recliner when OT arrived  Transfers Overall transfer level: Needs assistance   Transfers: Sit to/from Stand Sit to Stand: Supervision              Balance                                   ADL Overall ADL's : Needs assistance/impaired                     Lower Body Dressing: Min guard;Sit to/from stand;Supervision/safety       Toileting- Architect and Hygiene: Supervision/safety;Sit to/from stand Toileting - Clothing Manipulation Details (indicate cue type and reason): simulated in room       General ADL Comments: pt. able to return demo of LB dressing. educated and reinforced benefits of energy conservation techniques      Vision                     Perception     Praxis      Cognition   Behavior During Therapy: WFL for tasks assessed/performed Overall Cognitive Status: Within Functional Limits for tasks assessed                       Extremity/Trunk Assessment               Exercises     Shoulder Instructions       General Comments      Pertinent Vitals/ Pain        Pain Assessment: No/denies pain  Home Living                                          Prior Functioning/Environment              Frequency Min 2X/week     Progress Toward Goals  OT Goals(current goals can now be found in the care plan section)  Progress towards OT goals: Progressing toward goals     Plan Discharge plan remains appropriate    Co-evaluation                 End of Session     Activity Tolerance Patient tolerated treatment well   Patient Left in chair;with call bell/phone within reach   Nurse Communication          Time: 4098-1191 OT Time  Calculation (min): 20 min  Charges: OT General Charges $OT Visit: 1 Procedure OT Treatments $Self Care/Home Management : 8-22 mins  Robet Leu, COTA/L 01/29/2015, 9:59 AM

## 2015-01-29 NOTE — Progress Notes (Signed)
Subjective:  Doing well. Breathing better. Coughing better. Afebrile. Expressed some sad mood due to wife's sickness and the fact that he has not been eating per his usual since he has to make the meals now. Expressed interest in Meals on wheels again today. Also interested in Assisted living.  Objective: Vital signs in last 24 hours: Filed Vitals:   01/28/15 1342 01/28/15 1345 01/28/15 2033 01/29/15 0558  BP: 104/62 104/59 110/75 154/68  Pulse: 65 71 74 74  Temp:   98.3 F (36.8 C) 98.3 F (36.8 C)  TempSrc:   Oral Oral  Resp:   20 18  Height:      Weight:    109 lb 6.4 oz (49.624 kg)  SpO2: 99% 97% 98% 99%   Weight change: -1 lb 14.4 oz (-0.862 kg)  Intake/Output Summary (Last 24 hours) at 01/29/15 0729 Last data filed at 01/29/15 0604  Gross per 24 hour  Intake    840 ml  Output      0 ml  Net    840 ml   PHYSICAL EXAMINATION:  General: Chronically ill appearing  Heart: Normal rate and rhythm, soft systolic murmur present  Lungs: Bibasilar crackles, with no wheezing or ronchi Abdomen: Soft, non-tender, non-distended with normal BS Extremities: No edema Neuro: a&ox3.    Lab Results: Basic Metabolic Panel:  Recent Labs Lab 01/27/15 0302 01/28/15 0325  NA 144 141  K 3.6 3.6  CL 112* 107  CO2 24 26  GLUCOSE 100* 96  BUN 28* 24*  CREATININE 1.25* 1.35*  CALCIUM 8.8* 8.7*   Liver Function Tests:  Recent Labs Lab 01/26/15 0250  AST 10*  ALT 11*  ALKPHOS 64  BILITOT 0.5  PROT 5.3*  ALBUMIN 2.2*   CBC:  Recent Labs Lab 01/27/15 0302 01/28/15 0325  WBC 5.4 4.2  NEUTROABS 3.9  --   HGB 8.6* 8.9*  HCT 27.4* 27.8*  MCV 93.8 93.6  PLT 196 206   Urinalysis:  Recent Labs Lab 01/25/15 1938  COLORURINE YELLOW  LABSPEC 1.020  PHURINE 5.0  GLUCOSEU NEGATIVE  HGBUR NEGATIVE  BILIRUBINUR SMALL*  KETONESUR 15*  PROTEINUR NEGATIVE  UROBILINOGEN 0.2  NITRITE NEGATIVE  LEUKOCYTESUR NEGATIVE   Misc. Labs:  Micro Results: Recent Results  (from the past 240 hour(s))  MRSA PCR Screening     Status: Abnormal   Collection Time: 01/25/15 11:46 PM  Result Value Ref Range Status   MRSA by PCR POSITIVE (A) NEGATIVE Final    Comment:        The GeneXpert MRSA Assay (FDA approved for NASAL specimens only), is one component of a comprehensive MRSA colonization surveillance program. It is not intended to diagnose MRSA infection nor to guide or monitor treatment for MRSA infections. RESULT CALLED TO, READ BACK BY AND VERIFIED WITH: RN LIZ YNOT [phone]  THANEY    Studies/Results: No results found. Medications: I have reviewed the patient's current medications. Scheduled Meds: . amLODipine  5 mg Oral Daily  . amoxicillin-clavulanate  1 tablet Oral BID  . aspirin EC  81 mg Oral QHS  . benzonatate  100 mg Oral BID  . budesonide-formoterol  2 puff Inhalation BID  . Chlorhexidine Gluconate Cloth  6 each Topical Q0600  . doxycycline  100 mg Oral Q12H  . feeding supplement (ENSURE ENLIVE)  237 mL Oral Q24H  . ferrous gluconate  324 mg Oral Daily  . heparin  5,000 Units Subcutaneous 3 times per day  . metoprolol tartrate  25 mg Oral BID  . mirtazapine  30 mg Oral QHS  . mupirocin ointment  1 application Nasal BID  . pantoprazole  40 mg Oral BID  . tiotropium  18 mcg Inhalation Daily  . vitamin B-12  1,000 mcg Oral Daily  . zolpidem  5 mg Oral QHS   Continuous Infusions:   PRN Meds:.albuterol, ipratropium-albuterol, senna-docusate, simethicone Assessment/Plan: Active Problems:   HCAP (healthcare-associated pneumonia)  81 y/o male w/pmhx of CKD, COPD, anemia, and HTN who presents with SOB from PCP office found to have RUL PNA.   Right Upper lobe HCAP vs aspiration pneumonia- Pt improved currently on home 3L with 97-100% SpO2. Pt with recent hospital admission on 11/2014 for 3 days and reports coughing/choking with swallowing concerning for HCAP vs aspiration pneumonia. Chest xray with RUL PNA. -Oxygen therapy to keep  SpO2 >92% (pt at home on 3 L) -No blood cultures were drawn on admission, sputum cx ordered but not collected.  -Transitioned from IV vancomycin and zosyn after 2 days to PO augmentin 875-125 mg BID & doxycyline 100 mg BID. Continue on discharge to complete 7 days course on 02/01/15 -Continue tessalon 100 mg BID for cough -SLP - recommended mech soft diet but showed no evidence of aspiration or dysphagia.  -Pt needs repeat chest xray in 4-6 weeks to ensure resolution  Recurrent Falls - Etiology unclear, possibly due to ataxia from B12 deficiency. Pt also reports occasional palpitations concerning for PAF vs orthostatic hypotension in setting of malnutrition and low PO intake. 2D-echo on 11/27/14 with grade 1 diastolic dysfunction  with no valvular dysfunction.  - monitored on tele, no arrythmia other than 5 beats on NSVT 2 nights ago.  orthostatic vital signs negative (but that's after fluid admin) B12 was low. received IM B12 1000 mcg today  -continue PO B12 1000 mcg daily -Obtained dietician consult to assess nutrition requirement and SW consulted for meals on wheels  -consulted PT and OT, recommend home health -may Consider holter monitor as outpatient if symptoms don't improve with B12 correction, but will leave up to PCP.   CKD Stage 3 - Cr 1.35 at baseline 1.2-1.3 with normal urine output.   -Avoid nephrotoxins  Oxygen-dependent COPD - Currently with no exacerbation. Pt at home on 3L O2.  -Continue spiriva 18 mcg daily and symbicort 2 puffs BID  -Continue albuterol nebulizer Q 4 hr PRN -Continue tessalon 100 mg BID for cough  B12 Deficiency - Level low at 121. Pt with recurrent falls possibly due to ataxia.  -Gave IM B12 1000 mcg, Started PO B12 1000 mcg daily -Checked intrinsic factor Ab - pending  Chronic anemia-- Hg 8.9 near baseline  9-11 with no active bleeding. MCV 90's. Anemia panel with probable mixed IDA with ferritin 38 and macrocytic anemia with B12 deficiency.   -Continue  ferrous gluconate 324 mg daily (allergy to ferrous sulfate), replete B12 as above. -Pt needs outpatient screening colonoscopy   Hypertension - Currently hypertensive 160/72 -Continue home metoprolol 25 mg BID  -Increased norvasc 2.5 mg daily to 5 mg daily   - further titration of BP meds per PCP on follow up.   CAD - Pt with no anginal symptoms. Pt with stress test in 2013 that was negative for ischemia. Pt has moderate b/l ICA stenosis and receives carotid dopplers every 6 months.  -Continue aspirin 81mg  daily -Pt not on statin therapy at home   GERD - Currently with no symptoms.  -Continue home protonix 40 mg daily  Depression - Currently with stable mood.  -Continue home mirtazapine 30 mg daily and temazepam  Insomnia - Currently stable -Ambien 5 mg PRN    Diet:  Heart healthy  DVT Ppx: SQ heparin TID CODE: Full   Dispo: 1 day   The patient does have a current PCP Eartha Inch, MD) and does not need an [facility] hospital follow-up appointment after discharge.  The patient does not have transportation limitations that hinder transportation to clinic appointments.  .Services Needed at time of discharge: Y = Yes, Blank = No PT:  Home health  OT:  Home health   RN:   Equipment:   Other:     LOS: 4 days   Hyacinth Meeker, MD 01/29/2015, 7:29 AM

## 2015-01-29 NOTE — Discharge Instructions (Signed)
Please finish taking your antibiotics through 02/01/2015.   Follow up with your primary doctor and discuss about your depressive thoughts.   You need follow up chest xray.  Talk to your doctor about assisted living.

## 2015-01-29 NOTE — Progress Notes (Signed)
Report given to Adam Phenix, flex RN for 11a-3p shift. Pt stated he should be going home today per MD this morning.  Pt has valuables slip in chart and this info given to Gabe.

## 2015-01-29 NOTE — Progress Notes (Signed)
Patient seen and examined. Case d.w residents in detail. I agree with findings and plan as documented in Dr. Marcelino Freestone note.  Patient is much improved today. He is stable for d/c home today on PO augmentin and doxy to complete 10 day course of abx for HCAP. Unlikely aspiration PNA. SLP eval appreciated. F/u repeat CXR in 4-6 weeks to ensure resolution of PNA.  Of note patient also noted to have B12 deficiency and started on B12 supplementation- possible etiology for recurrent falls. C/w home PT  Patient also noted to be iron deficient. Will need screening colonoscopy if he has not had one recently.  Information given to patient by SW about assisted living options for future planning

## 2015-01-29 NOTE — Progress Notes (Signed)
Pt's 02 sat = 94 % on room air while standing at rest.  Dropped to 88% while walking and room air.  02 added at 2L and 02 sat increased to 96% while walking.

## 2015-01-29 NOTE — Progress Notes (Signed)
Discharge instructions reviewed with the patient.  Medications reviewed to include the medications called in to his pharmacy.  Follow up appointments reviewed with the patient.  Patient voices understanding to teaching.  Printed copies given to the patient.  Patient was ambulatory to the door with oxygen, approx 500 feet, with his walker.  Home vis POV with Aurther Loft driving.

## 2015-01-29 NOTE — Progress Notes (Signed)
Nutrition Follow-up/Consult  DOCUMENTATION CODES:  Non-severe (moderate) malnutrition in context of chronic illness, Underweight  INTERVENTION: Encourage PO intake Provided and discussed "High Calorie High Protein Nutrition Therapy" and "Suggestions for Increasing Calories and Protein" handouts from the Academy of [employer] and Dietetics.  Pt declines any additional interventions at this time  NUTRITION DIAGNOSIS:  Malnutrition related to chronic illness as evidenced by moderate depletion of body fat, severe depletion of muscle mass.  Ongoing  GOAL:  Patient will meet greater than or equal to 90% of their needs  Unmet  MONITOR:  PO intake, Labs, Weight trends, Skin  REASON FOR ASSESSMENT:  Malnutrition Screening Tool    ASSESSMENT: 81 y/o M w/ PMHx of COPD on 3L home O2, CAD, HTN, and CKD who presents with SOB. Pt fell on his left side while walking 4 days ago. States he has been "staggering" more lately and uses a cane and walker to ambulate. He blacked out for 4-5 mins and was able to get up on his own afterwards. He blacks out 2-3 times a weeks and describes episodes as fading and and out. Xray of the lumbar spine did reveal a T4 compression fracture that is unchanged from April.  RD consulted for assessment. Follow-up with pt today. Per nursing notes, pt is eating 25 to 100% of meals, 25-75% of most. Pt tried Ensure but, does not like it. He states he receives meal-on-wheels at home, he does not like snacking. RD encouraged increasing PO intake to avoid further weight loss. Provided and discussed "High Calorie High Protein Nutrition Therapy" and "Suggestions for Increasing Calories and Protein" handouts from the Academy of Nutrition and Dietetics. Pt voiced understanding. RD contact information provided.   Height:  Ht Readings from Last 1 Encounters:  01/25/15 5\' 10"  (1.778 m)    Weight:  Wt Readings from Last 1 Encounters:  01/29/15 109 lb 6.4 oz (49.624 kg)     Ideal Body Weight:  75.5 kg  Wt Readings from Last 10 Encounters:  01/29/15 109 lb 6.4 oz (49.624 kg)  11/28/14 114 lb (51.71 kg)  06/12/14 118 lb 6.2 oz (53.7 kg)  03/27/14 119 lb 6.4 oz (54.159 kg)  11/28/13 124 lb 8 oz (56.473 kg)  11/11/13 121 lb 11.1 oz (55.2 kg)  11/07/13 121 lb 9.6 oz (55.157 kg)  10/31/13 122 lb 9.6 oz (55.611 kg)  10/10/13 117 lb 8 oz (53.298 kg)  09/27/13 119 lb (53.978 kg)    BMI:  Body mass index is 15.7 kg/(m^2).  (underweight)  Estimated Nutritional Needs:  Kcal:  1500-1700  Protein:  70-80 grams  Fluid:  1.5-1.7 L/day  Skin:  Reviewed, no issues  Diet Order:  DIET DYS 3 Room service appropriate?: Yes; Fluid consistency:: Thin  EDUCATION NEEDS:  No education needs identified at this time   Intake/Output Summary (Last 24 hours) at 01/29/15 1127 Last data filed at 01/29/15 1001  Gross per 24 hour  Intake    840 ml  Output      0 ml  Net    840 ml    Last BM:  6/12  Ian Malkin RD, LDN Inpatient Clinical Dietitian Pager: [phone] After Hours Pager: [phone]

## 2015-01-29 NOTE — Progress Notes (Signed)
Subjective:  No acute complaints overnight, he does complain of some hip pain but otherwise feels well. He denies any headache, N/V, chest pain, SOB. When asked he states he is depressed, mostly about his home life and his wife being disabled.   Objective: Vital signs in last 24 hours: Filed Vitals:   01/28/15 2033 01/29/15 0558 01/29/15 0745 01/29/15 0800  BP: 110/75 154/68  130/70  Pulse: 74 74  68  Temp: 98.3 F (36.8 C) 98.3 F (36.8 C)  98.2 F (36.8 C)  TempSrc: Oral Oral    Resp: 20 18  18   Height:      Weight:  49.624 kg (109 lb 6.4 oz)    SpO2: 98% 99% 94% 100%   Weight change: -0.862 kg (-1 lb 14.4 oz)  Intake/Output Summary (Last 24 hours) at 01/29/15 1300 Last data filed at 01/29/15 1001  Gross per 24 hour  Intake    840 ml  Output      0 ml  Net    840 ml    General: Sitting in the chair beside the bed with his clothes on.  HEENT: PERRL, EOMI, no scleral icterus Cardiac: RRR, no rubs, murmurs or gallops Pulm: clear to auscultation bilaterally, moving normal volumes of air Abd: soft, nontender, nondistended, BS present Ext: warm and well perfused, no pedal edema Neuro: alert and oriented X3, cranial nerves II-XII grossly intact. Positive rhomberg (01/28/15) Skin: bruising noted bilaterally on upper extremities.  Psych: appropriate affect  Lab Results:  Admission on 01/25/2015  Component Date Value Ref Range Status  . Sodium 01/25/2015 146* 135 - 145 mmol/L Final  . Potassium 01/25/2015 3.9  3.5 - 5.1 mmol/L Final  . Chloride 01/25/2015 112* 101 - 111 mmol/L Final  . CO2 01/25/2015 23  22 - 32 mmol/L Final  . Glucose, Bld 01/25/2015 91  65 - 99 mg/dL Final  . BUN 16/05/9603 26* 6 - 20 mg/dL Final  . Creatinine, Ser 01/25/2015 1.56* 0.61 - 1.24 mg/dL Final  . Calcium 54/04/8118 8.8* 8.9 - 10.3 mg/dL Final  . GFR calc non Af Amer 01/25/2015 39* >60 mL/min Final  . GFR calc Af Amer 01/25/2015 45* >60 mL/min Final  . Anion gap 01/25/2015 11  5 - 15  Final  . WBC 01/25/2015 5.9  4.0 - 10.5 K/uL Final  . RBC 01/25/2015 3.00* 4.22 - 5.81 MIL/uL Final  . Hemoglobin 01/25/2015 9.0* 13.0 - 17.0 g/dL Final  . HCT 14/78/2956 28.9* 39.0 - 52.0 % Final  . MCV 01/25/2015 96.3  78.0 - 100.0 fL Final  . MCH 01/25/2015 30.0  26.0 - 34.0 pg Final  . MCHC 01/25/2015 31.1  30.0 - 36.0 g/dL Final  . RDW 21/30/8657 14.9  11.5 - 15.5 % Final  . Platelets 01/25/2015 180  150 - 400 K/uL Final  . Troponin i, poc 01/25/2015 0.00  0.00 - 0.08 ng/mL Final  . Comment 3 01/25/2015          Final  . B Natriuretic Peptide 01/25/2015 214.1* 0.0 - 100.0 pg/mL Final  . Color, Urine 01/25/2015 YELLOW  YELLOW Final  . APPearance 01/25/2015 CLEAR  CLEAR Final  . Specific Gravity, Urine 01/25/2015 1.020  1.005 - 1.030 Final  . pH 01/25/2015 5.0  5.0 - 8.0 Final  . Glucose, UA 01/25/2015 NEGATIVE  NEGATIVE mg/dL Final  . Hgb urine dipstick 01/25/2015 NEGATIVE  NEGATIVE Final  . Bilirubin Urine 01/25/2015 SMALL* NEGATIVE Final  . Ketones, ur 01/25/2015 15* NEGATIVE  mg/dL Final  . Protein, ur 16/05/9603 NEGATIVE  NEGATIVE mg/dL Final  . Urobilinogen, UA 01/25/2015 0.2  0.0 - 1.0 mg/dL Final  . Nitrite 54/04/8118 NEGATIVE  NEGATIVE Final  . Leukocytes, UA 01/25/2015 NEGATIVE  NEGATIVE Final  . Sodium 01/26/2015 144  135 - 145 mmol/L Final  . Potassium 01/26/2015 4.0  3.5 - 5.1 mmol/L Final  . Chloride 01/26/2015 109  101 - 111 mmol/L Final  . CO2 01/26/2015 24  22 - 32 mmol/L Final  . Glucose, Bld 01/26/2015 191* 65 - 99 mg/dL Final  . BUN 14/78/2956 25* 6 - 20 mg/dL Final  . Creatinine, Ser 01/26/2015 1.29* 0.61 - 1.24 mg/dL Final  . Calcium 21/30/8657 8.6* 8.9 - 10.3 mg/dL Final  . Total Protein 01/26/2015 5.3* 6.5 - 8.1 g/dL Final  . Albumin 84/69/6295 2.2* 3.5 - 5.0 g/dL Final  . AST 28/41/3244 10* 15 - 41 U/L Final  . ALT 01/26/2015 11* 17 - 63 U/L Final  . Alkaline Phosphatase 01/26/2015 64  38 - 126 U/L Final  . Total Bilirubin 01/26/2015 0.5  0.3 - 1.2  mg/dL Final  . GFR calc non Af Amer 01/26/2015 49* >60 mL/min Final  . GFR calc Af Amer 01/26/2015 57* >60 mL/min Final  . Anion gap 01/26/2015 11  5 - 15 Final  . MRSA by PCR 01/25/2015 POSITIVE* NEGATIVE Final  . Sodium 01/26/2015 142  135 - 145 mmol/L Final  . Potassium 01/26/2015 4.2  3.5 - 5.1 mmol/L Final  . Chloride 01/26/2015 111  101 - 111 mmol/L Final  . CO2 01/26/2015 20* 22 - 32 mmol/L Final  . Glucose, Bld 01/26/2015 154* 65 - 99 mg/dL Final  . BUN 08/20/7251 28* 6 - 20 mg/dL Final  . Creatinine, Ser 01/26/2015 1.25* 0.61 - 1.24 mg/dL Final  . Calcium 66/44/0347 8.9  8.9 - 10.3 mg/dL Final  . GFR calc non Af Amer 01/26/2015 51* >60 mL/min Final  . GFR calc Af Amer 01/26/2015 59* >60 mL/min Final  . Anion gap 01/26/2015 11  5 - 15 Final  . Sodium 01/27/2015 144  135 - 145 mmol/L Final  . Potassium 01/27/2015 3.6  3.5 - 5.1 mmol/L Final  . Chloride 01/27/2015 112* 101 - 111 mmol/L Final  . CO2 01/27/2015 24  22 - 32 mmol/L Final  . Glucose, Bld 01/27/2015 100* 65 - 99 mg/dL Final  . BUN 42/59/5638 28* 6 - 20 mg/dL Final  . Creatinine, Ser 01/27/2015 1.25* 0.61 - 1.24 mg/dL Final  . Calcium 75/64/3329 8.8* 8.9 - 10.3 mg/dL Final  . GFR calc non Af Amer 01/27/2015 51* >60 mL/min Final  . GFR calc Af Amer 01/27/2015 59* >60 mL/min Final  . Anion gap 01/27/2015 8  5 - 15 Final  . WBC 01/27/2015 5.4  4.0 - 10.5 K/uL Final  . RBC 01/27/2015 2.92* 4.22 - 5.81 MIL/uL Final  . Hemoglobin 01/27/2015 8.6* 13.0 - 17.0 g/dL Final  . HCT 51/88/4166 27.4* 39.0 - 52.0 % Final  . MCV 01/27/2015 93.8  78.0 - 100.0 fL Final  . MCH 01/27/2015 29.5  26.0 - 34.0 pg Final  . MCHC 01/27/2015 31.4  30.0 - 36.0 g/dL Final  . RDW 02/15/1600 14.2  11.5 - 15.5 % Final  . Platelets 01/27/2015 196  150 - 400 K/uL Final  . Neutrophils Relative % 01/27/2015 73  43 - 77 % Final  . Neutro Abs 01/27/2015 3.9  1.7 - 7.7 K/uL Final  . Lymphocytes Relative  01/27/2015 16  12 - 46 % Final  . Lymphs Abs  01/27/2015 0.9  0.7 - 4.0 K/uL Final  . Monocytes Relative 01/27/2015 9  3 - 12 % Final  . Monocytes Absolute 01/27/2015 0.5  0.1 - 1.0 K/uL Final  . Eosinophils Relative 01/27/2015 2  0 - 5 % Final  . Eosinophils Absolute 01/27/2015 0.1  0.0 - 0.7 K/uL Final  . Basophils Relative 01/27/2015 0  0 - 1 % Final  . Basophils Absolute 01/27/2015 0.0  0.0 - 0.1 K/uL Final  . Procalcitonin 01/28/2015 0.11   Final  . Vitamin B-12 01/28/2015 121* 180 - 914 pg/mL Final  . Folate 01/28/2015 8.5  >5.9 ng/mL Final  . Iron 01/28/2015 32* 45 - 182 ug/dL Final  . TIBC 16/05/9603 332  250 - 450 ug/dL Final  . Saturation Ratios 01/28/2015 10* 17.9 - 39.5 % Final  . UIBC 01/28/2015 300   Final  . Ferritin 01/28/2015 38  24 - 336 ng/mL Final  . Retic Ct Pct 01/28/2015 1.0  0.4 - 3.1 % Final  . RBC. 01/28/2015 2.97* 4.22 - 5.81 MIL/uL Final  . Retic Count, Manual 01/28/2015 29.7  19.0 - 186.0 K/uL Final  . Tech Review 01/28/2015 POLYCHROMASIA PRESENT   Final  . WBC 01/28/2015 4.2  4.0 - 10.5 K/uL Final  . RBC 01/28/2015 2.97* 4.22 - 5.81 MIL/uL Final  . Hemoglobin 01/28/2015 8.9* 13.0 - 17.0 g/dL Final  . HCT 54/04/8118 27.8* 39.0 - 52.0 % Final  . MCV 01/28/2015 93.6  78.0 - 100.0 fL Final  . MCH 01/28/2015 30.0  26.0 - 34.0 pg Final  . MCHC 01/28/2015 32.0  30.0 - 36.0 g/dL Final  . RDW 14/78/2956 14.5  11.5 - 15.5 % Final  . Platelets 01/28/2015 206  150 - 400 K/uL Final  . Sodium 01/28/2015 141  135 - 145 mmol/L Final  . Potassium 01/28/2015 3.6  3.5 - 5.1 mmol/L Final  . Chloride 01/28/2015 107  101 - 111 mmol/L Final  . CO2 01/28/2015 26  22 - 32 mmol/L Final  . Glucose, Bld 01/28/2015 96  65 - 99 mg/dL Final  . BUN 21/30/8657 24* 6 - 20 mg/dL Final  . Creatinine, Ser 01/28/2015 1.35* 0.61 - 1.24 mg/dL Final  . Calcium 84/69/6295 8.7* 8.9 - 10.3 mg/dL Final  . GFR calc non Af Amer 01/28/2015 46* >60 mL/min Final  . GFR calc Af Amer 01/28/2015 54* >60 mL/min Final  . Anion gap 01/28/2015 8  5 -  15 Final   Micro Results: Recent Results (from the past 240 hour(s))  MRSA PCR Screening     Status: Abnormal   Collection Time: 01/25/15 11:46 PM  Result Value Ref Range Status   MRSA by PCR POSITIVE (A) NEGATIVE Final    Comment:        The GeneXpert MRSA Assay (FDA approved for NASAL specimens only), is one component of a comprehensive MRSA colonization surveillance program. It is not intended to diagnose MRSA infection nor to guide or monitor treatment for MRSA infections. RESULT CALLED TO, READ BACK BY AND VERIFIED WITH: RN LIZ YNOT [id number]  THANEY    Studies/Results: No results found. Medications: I have reviewed the patient's current medications. Scheduled Meds: . amLODipine  5 mg Oral Daily  . amoxicillin-clavulanate  1 tablet Oral BID  . aspirin EC  81 mg Oral QHS  . benzonatate  100 mg Oral BID  . budesonide-formoterol  2 puff Inhalation BID  .  Chlorhexidine Gluconate Cloth  6 each Topical Q0600  . doxycycline  100 mg Oral Q12H  . feeding supplement (ENSURE ENLIVE)  237 mL Oral Q24H  . ferrous gluconate  324 mg Oral Daily  . heparin  5,000 Units Subcutaneous 3 times per day  . metoprolol tartrate  25 mg Oral BID  . mirtazapine  30 mg Oral QHS  . mupirocin ointment  1 application Nasal BID  . pantoprazole  40 mg Oral BID  . tiotropium  18 mcg Inhalation Daily  . vitamin B-12  1,000 mcg Oral Daily  . zolpidem  5 mg Oral QHS   Continuous Infusions:  PRN Meds:.albuterol, ipratropium-albuterol, senna-docusate, simethicone Assessment/Plan: Active Problems:   HCAP (healthcare-associated pneumonia)  Discharge home today  Right Upper lobe HCAP vs aspiration pneumonia- Pt improved currently on home 3L with 97-100% SpO2. Pt with recent hospital admission on 11/2014 for 3 days and reports coughing/choking with swallowing concerning for HCAP vs aspiration pneumonia. Chest xray with RUL PNA. -Oxygen therapy to keep SpO2 >92% (pt at home on 3 L) -No blood cultures  were drawn on admission, sputum cx ordered but not collected.  -Transitioned from IV vancomycin and zosyn after 2 days to PO augmentin 875-125 mg BID & doxycyline 100 mg BID. Continue on discharge to complete 7 days course on 02/01/15 -Continue tessalon 100 mg BID for cough -SLP - recommended mech soft diet but showed no evidence of aspiration or dysphagia.  -Pt needs repeat chest xray in 4-6 weeks to ensure resolution - Pt to see speech therapy for swallow study to eval for aspiration. - Calling Patient's PCP to talk about our thoughts on the patient's condition and stability at home especially with his wife who is now disabled. Will also talk about colonoscopy.  Recurrent Falls - Etiology unclear, possibly due to ataxia from B12 deficiency. Pt also reports occasional palpitations concerning for PAF vs orthostatic hypotension in setting of malnutrition and low PO intake. 2D-echo on 11/27/14 with grade 1 diastolic dysfunction with no valvular dysfunction.  - monitored on tele, no arrythmia other than 5 beats on NSVT 2 nights ago. orthostatic vital signs negative (but that's after fluid admin) B12 was low. received IM B12 1000 mcg today  -continue PO B12 1000 mcg daily -Obtained dietician consult to assess nutrition requirement and SW consulted for meals on wheels  -consulted PT and OT, recommend home health -may Consider holter monitor as outpatient if symptoms don't improve with B12 correction, but will leave up to PCP.   CKD Stage 3 - Cr 1.35 at baseline 1.2-1.3 with normal urine output.  -Avoid nephrotoxins  Oxygen-dependent COPD - Currently with no exacerbation. Pt at home on 3L O2.  -Continue spiriva 18 mcg daily and symbicort 2 puffs BID  -Continue albuterol nebulizer Q 4 hr PRN -Continue tessalon 100 mg BID for cough  B12 Deficiency - Level low at 121. Pt with recurrent falls possibly due to ataxia.  -Gave IM B12 1000 mcg, Started PO B12 1000 mcg daily -Checked intrinsic  factor Ab - pending  Chronic anemia-- Hg 8.9 near baseline 9-11 with no active bleeding. MCV 90's. Anemia panel with probable mixed IDA with ferritin 38 and macrocytic anemia with B12 deficiency.  -Continue ferrous gluconate 324 mg daily (allergy to ferrous sulfate), replete B12 as above. -Pt may need outpatient screening colonoscopy   Hypertension - Currently hypertensive 160/72 -Continue home metoprolol 25 mg BID  -Increased norvasc 2.5 mg daily to 5 mg daily  - further titration  of BP meds per PCP on follow up.   CAD - Pt with no anginal symptoms. Pt with stress test in 2013 that was negative for ischemia. Pt has moderate b/l ICA stenosis and receives carotid dopplers every 6 months.  -Continue aspirin  daily -Pt not on statin therapy at home   GERD - Currently with no symptoms.  -Continue home protonix 40 mg daily   Depression - Currently with stable mood.  -Continue home mirtazapine 30 mg daily and temazepam - Patient may need further help with his depression.   Insomnia - Currently stable -Ambien 5 mg PRN    Diet: Heart healthy  DVT Ppx: SQ heparin TID CODE: Full     LOS: 4 days   Services Needed at time of discharge: Y = Yes, Blank = No PT: Home PT  OT:   RN:   Equipment:   Other:    Janett Labella, Med Student 01/29/2015, 1:00 PM

## 2015-01-29 NOTE — Evaluation (Signed)
Clinical/Bedside Swallow Evaluation Patient Details  Name: Alec Walker MRN: [id number] Date of Birth: [birth year]  Today's Date: 01/29/2015 Time: SLP Start Time (ACUTE ONLY): 4098 SLP Stop Time (ACUTE ONLY): 1013 SLP Time Calculation (min) (ACUTE ONLY): 15 min  Past Medical History:  Past Medical History  Diagnosis Date  . COPD (chronic obstructive pulmonary disease)   . CAD (coronary artery disease)     a.  nonobstructive CAD by cath 1999. b. Myoview 04/2012: fixed inferior and inferoseptal bowel attenuation artifact, no reversible ischemia. EF 62%. Low risk scan.  Marland Kitchen HTN (hypertension)   . PUD (peptic ulcer disease)     a. s/p surgery 1976.  Marland Kitchen Syncope     2012  . Carotid artery disease 03/22/13    a. 50-69% BICA by duplex 03/2013, repeat needed 11/2013.  Marland Kitchen PAD (peripheral artery disease)     a. prior stenting of RCIA 1999. b. history of 90% vertebral artery narrowing, 40% subclavian artery narrowing. c. Diagnostic PV angio 08/2013 for progressive claudication - will ultimately need endarterectomy/patch angio on bilat CFA stenosis; will also need atherectomy/PT/stenting to REIA.   . CKD (chronic kidney disease), stage III   . HLD (hyperlipidemia)   . Chronic respiratory failure     a. On home O2.  Marland Kitchen NSVT (nonsustained ventricular tachycardia)     a. Brief NSVT (6b) during 08/2013 adm.  . Stroke   . Shortness of breath   . On supplemental oxygen therapy     2l   Past Surgical History:  Past Surgical History  Procedure Laterality Date  . Cataract extraction    . Gastrectomy    . Transurethral resection of prostate    . Coronary angioplasty with stent placement      stent in 2003; history of right common iliac artery stent hx  . Endarterectomy femoral Bilateral 11/11/2013    Procedure: BILATERAL FEMORAL ENDARTERECTOMIES WITH BILATERAL PATCH ANGIOPLASTIES;  Surgeon: Nada Libman, MD;  Location: [facility] OR;  Service: Vascular;  Laterality: Bilateral;  . Lower extremity angiogram  N/A 09/01/2013    Procedure: LOWER EXTREMITY ANGIOGRAM;  Surgeon: Runell Gess, MD;  Location: [facility] CATH LAB;  Service: Cardiovascular;  Laterality: N/A;   HPI:  81 y/o male w/pmhx of CKD, COPD, anemia, and HTN who presents with SOB from PCP office found to have RUL PNA. Pt with recent hospital admission on 11/2014 for 3 days and reports coughing/choking with swallowing concerning for HCAP vs aspiration pneumonia.   Assessment / Plan / Recommendation Clinical Impression  Pt demonstrates no evidence of aspiration or oral or oropharyngeal dysphagia. He does report occasional regurgitation of food. He also has an UGI this year showing severe gastroesophageal reflux. Pt at risk of postprandial aspiration given this history. Advised pt regarding basic esophageal precautions. No further SLP f/u needed though pt may benefit from f/u with GI MD.     Aspiration Risk  Moderate    Diet Recommendation Dysphagia 3 (Mech soft);Thin   Medication Administration: Whole meds with liquid Compensations: Small sips/bites;Slow rate;Follow solids with liquid    Other  Recommendations Recommended Consults: Consider GI evaluation Oral Care Recommendations: Oral care BID   Follow Up Recommendations       Frequency and Duration        Pertinent Vitals/Pain NA    SLP Swallow Goals     Swallow Study Prior Functional Status       General Other Pertinent Information: 81 y/o male w/pmhx of CKD, COPD, anemia, and HTN  who presents with SOB from PCP office found to have RUL PNA. Pt with recent hospital admission on 11/2014 for 3 days and reports coughing/choking with swallowing concerning for HCAP vs aspiration pneumonia. Type of Study: Bedside swallow evaluation Previous Swallow Assessment: Upper GI 2/16 - severe gastroesophageal reflux Diet Prior to this Study: Regular;Thin liquids Temperature Spikes Noted: No Respiratory Status: Supplemental O2 delivered via (comment) History of Recent Intubation:  No Behavior/Cognition: Alert;Cooperative;Pleasant mood Oral Cavity - Dentition: Missing dentition Self-Feeding Abilities: Able to feed self Patient Positioning: Upright in chair/Tumbleform Baseline Vocal Quality: Normal Volitional Cough: Strong Volitional Swallow: Able to elicit    Oral/Motor/Sensory Function Overall Oral Motor/Sensory Function: Appears within functional limits for tasks assessed   Ice Chips     Thin Liquid Thin Liquid: Within functional limits    Nectar Thick Nectar Thick Liquid: Not tested   Honey Thick Honey Thick Liquid: Not tested   Puree Puree: Within functional limits   Solid   GO    Solid: Within functional limits       Narjis Walker, Alec Walker 01/29/2015,10:18 AM

## 2015-01-29 NOTE — Discharge Summary (Signed)
Name: Alec Walker MRN: [id number] DOB: 09/14/1926 81 y.o. PCP: Eartha Inch, MD  Date of Admission: 01/25/2015  4:47 PM Date of Discharge: 01/29/2015 Attending Physician: Earl Lagos, MD  Discharge Diagnosis:  Active Problems:   HCAP (healthcare-associated pneumonia)  Discharge Medications:   Medication List    TAKE these medications        albuterol 108 (90 BASE) MCG/ACT inhaler  Commonly known as:  PROVENTIL HFA;VENTOLIN HFA  Inhale 2 puffs into the lungs every 4 (four) hours as needed for wheezing or shortness of breath.     amLODipine 5 MG tablet  Commonly known as:  NORVASC  Take 1 tablet (5 mg total) by mouth daily.     amoxicillin-clavulanate 500-125 MG per tablet  Commonly known as:  AUGMENTIN  Take 1 tablet (500 mg total) by mouth 2 (two) times daily.     aspirin EC 81 MG tablet  Take 81 mg by mouth at bedtime.     benzonatate 100 MG capsule  Commonly known as:  TESSALON  Take 1 capsule (100 mg total) by mouth 2 (two) times daily.     budesonide-formoterol 160-4.5 MCG/ACT inhaler  Commonly known as:  SYMBICORT  Inhale 2 puffs into the lungs 2 (two) times daily.     cyanocobalamin 1000 MCG tablet  Take 1 tablet (1,000 mcg total) by mouth daily.     diazepam 5 MG tablet  Commonly known as:  VALIUM  Take 5 mg by mouth every 8 (eight) hours as needed for anxiety (sleep).     diclofenac 75 MG EC tablet  Commonly known as:  VOLTAREN  Take 75 mg by mouth 2 (two) times daily.     doxycycline 100 MG tablet  Commonly known as:  VIBRA-TABS  Take 1 tablet (100 mg total) by mouth every 12 (twelve) hours.     feeding supplement (ENSURE ENLIVE) Liqd  Take 237 mLs by mouth daily.     ferrous gluconate 324 MG tablet  Commonly known as:  FERGON  Take 1 tablet (324 mg total) by mouth daily.     metoprolol tartrate 25 MG tablet  Commonly known as:  LOPRESSOR  Take 25 mg by mouth 2 (two) times daily.     mirtazapine 30 MG tablet  Commonly known  as:  REMERON  Take 30 mg by mouth at bedtime.     nitroGLYCERIN 0.4 MG SL tablet  Commonly known as:  NITROSTAT  Place 1 tablet (0.4 mg total) under the tongue every 5 (five) minutes as needed for chest pain.     OXYGEN  Inhale 3 L into the lungs as needed. 2 liters     pantoprazole 40 MG tablet  Commonly known as:  PROTONIX  Take 40 mg by mouth 2 (two) times daily.     senna-docusate 8.6-50 MG per tablet  Commonly known as:  Senokot-S  Take 1 tablet by mouth at bedtime as needed for mild constipation.     simethicone 80 MG chewable tablet  Commonly known as:  MYLICON  Chew 1 tablet (80 mg total) by mouth every 6 (six) hours as needed for flatulence.     temazepam 30 MG capsule  Commonly known as:  RESTORIL  Take 30 mg by mouth at bedtime. for sleep     tiotropium 18 MCG inhalation capsule  Commonly known as:  SPIRIVA HANDIHALER  Place 1 capsule (18 mcg total) into inhaler and inhale daily.     Vitamin D3 2000 UNITS  Tabs  Take 2,000 mg by mouth at bedtime.        Disposition and follow-up:   Alec Walker was discharged from [facility] in Stable condition.  At the hospital follow up visit please address:  1.  Please make sure he finishes his abx course through 02/01/15 - total 7 days. (augmentin + doxy) Obtain CXR in 4-6 weeks for assessing clearance of RUL PNA.  Patient has hx of recurrent falls likely due to orthostatic hypotension, poor nutrition, weakness, and B12 deficiency.  He also mentioned some occasional palpitations. Monitored on tele in the hospital without any arrythmias. Please consider outpatient Holter's monitor if his symptoms don't improve after B12 repletion.  Consider GI referral for colonoscopy with his chronic iron deficiency + b12 def anemia.  Patient is amenable to assisted living, please discuss this on follow up.    Patient is having some adjustment disorder related to his wife's illness. Please assess his mood on  follow up.  Please check BP and titrate BP meds on follow up. We increased his amlodipine to  daily.  2.  Labs / imaging needed at time of follow-up: BMET, B12 level after several weeks.  3.  Pending labs/ test needing follow-up: intrinsic factor  Follow-up Appointments: Follow-up Information    Follow up with Eartha Inch, MD. Schedule an appointment as soon as possible for a visit on 01/31/2015.   Specialty:  Family Medicine   Why:   noon   Contact information:   [address] [place] [address] [phone]       Discharge Instructions:   Consultations:    Procedures Performed:  Dg Chest 2 View (if Patient Has Fever And/or Copd)  01/25/2015   CLINICAL DATA:  Increase shortness of breath with weakness and fatigue  EXAM: CHEST  2 VIEW  COMPARISON:  11/28/2014  FINDINGS: Right upper lobe airspace disease which is new. There is a background of hyperinflation and emphysematous change. Normal heart size and mediastinal contours. Surgical clips again noted at the GE junction.  IMPRESSION: 1. Right upper lobe pneumonia. 2. Emphysema.   Electronically Signed   By: Marnee Spring M.D.   On: 01/25/2015 18:11   Dg Thoracic Spine W/swimmers  01/25/2015   CLINICAL DATA:  81 year old male with a history of fall 1 week prior. Left-sided back and hip pain  EXAM: THORACIC SPINE - 2 VIEW + SWIMMERS  COMPARISON:  November 25, 2014  FINDINGS: Thoracic vertebral elements maintain relative anatomic alignment. No subluxation.  Upper thoracic (likely T4) compression fracture, unchanged from comparison plain film.  Osteopenia, somewhat limiting evaluation of the vertebral bodies. No displaced fracture or fracture line identified.  Dense atherosclerotic calcifications.  Surgical changes of the epigastric region/lower mediastinum.  Coarsened interstitial markings of the lungs.  IMPRESSION: No acute fracture or malalignment identified, though osteopenia somewhat limits evaluation.  Upper thoracic  (likely T4) compression fracture, unchanged from comparison plain film from April.  Signed,  Yvone Neu. Loreta Ave, DO  [facility]  [facility]   Electronically Signed   By: Gilmer Mor D.O.   On: 01/25/2015 18:19   Dg Lumbar Spine Complete  01/25/2015   CLINICAL DATA:  Fall 1 week ago with left-sided back and hip pain.  EXAM: LUMBAR SPINE - COMPLETE 4+ VIEW  COMPARISON:  Lumbar spine MRI 02/23/2004  FINDINGS: Superior endplate deformities throughout the lumbar spine appear stable from 2005 MRI. Degenerative disc disease is advanced, with the worst disc  narrowing at L4-5 and L5-S1. No evidence of acute fracture, endplate erosion, or focal bone lesion.  Osteopenia, which limits the sensitivity of radiography.  Diffuse atherosclerotic calcification of the aorta and iliacs with a common iliac stent on the right. Surgical changes noted in the epigastrium.  IMPRESSION: 1. No acute findings. 2. Chronic/degenerative changes noted above.   Electronically Signed   By: Marnee Spring M.D.   On: 01/25/2015 18:16   US Abdomen Complete  01/26/2015   CLINICAL DATA:  81 year old male with left side pain and abdominal bruit. Initial encounter.  EXAM: ULTRASOUND ABDOMEN COMPLETE  COMPARISON:  Lumbar radiographs 01/25/2015.  Lumbar MRI 02/23/2004.  FINDINGS: Gallbladder: No gallstones or wall thickening visualized. No sonographic Murphy sign noted.  Common bile duct: Diameter: Not visualized due to overlying bowel gas  Liver: No intrahepatic biliary ductal dilatation. Mildly increased liver echogenicity. No discrete liver lesion.  IVC: No abnormality visualized.  Pancreas: Obscured by bowel gas  Spleen: Size and appearance within normal limits.  Right Kidney: Length: 9.4 cm. Echogenicity within normal limits. No mass or hydronephrosis visualized.  Left Kidney: Length: 9.4 cm. Suggestion of a nonobstructing upper pole calculus measuring 5 mm (image 43. Echogenicity within normal  limits. No mass or hydronephrosis visualized.  Abdominal aorta: Incompletely visualized due to overlying bowel gas, visualized portions within normal limits. No abdominal aortic aneurysm is identified. There is irregularity of the aorta compatible with atherosclerosis.  Other findings: None.  IMPRESSION: 1. Suboptimal due to excessive bowel gas. No abdominal aortic aneurysm identified. 2. No acute findings identified in the abdomen.   Electronically Signed   By: Odessa Fleming M.D.   On: 01/26/2015 07:23   Dg Hip Unilat With Pelvis 2-3 Views Left  01/25/2015   CLINICAL DATA:  Fall with left-sided hip and back pain. Initial encounter.  EXAM: LEFT HIP (WITH PELVIS) 2-3 VIEWS  COMPARISON:  None.  FINDINGS: Bones are osteopenic. No acute fracture or dislocation is identified. The bony pelvis is unremarkable. Visualized iliac arteries are heavily calcified. No bony lesions or destruction. No significant arthropathy.  IMPRESSION: No acute fracture identified.   Electronically Signed   By: Irish Lack M.D.   On: 01/25/2015 18:14     Admission HPI:   Pt is an 81 y/o M w/ PMHx of COPD on 3L home O2, CAD, HTN, and CKD who presents with SOB. He was seen by his PCP Dr. Cyndia Bent today in clinic for a fall and SOB. Pt fell on his left side while walking 4 days ago. States he has been "staggering" more lately and uses a cane and walker to ambulate. He blacked out for 4-5 mins and was able to get up on his own afterwards. He blacks out 2-3 times a weeks and describes episodes as fading and and out. Pt states he is still having left lateral rib pain from the fall that has remained stable. Plain films in the ED were neg for acute fractures, Xray of the lumbar spine did reveal a T4 compression fracture that is unchanged from April. Pt is also having SOB that is a recurrent problem for him. He has had increased SOB since his fall and increased his home O2 from 3L to 4L with improvement in SOB. He has a productive cough w/ clear  sputum and chills. He has issues with vomiting due to feeling full and heartburn and last vomited 3 days ago. Denies blood in vomit. Pt was advised to go to the ED for further workup  of SOB.  Last admitted on 4/9 for observation of chest pain. Trops neg x3, no intervention was done at that time.   Hospital Course by problem list:   81 y/o male w/pmhx of CKD, COPD, anemia, and HTN who presents with SOB from PCP office found to have RUL PNA.   Right Upper lobe HCAP vs aspiration pneumonia- Pt improved currently on home 3L with 97-100% SpO2. Pt with recent hospital admission on 11/2014 for 3 days and reports coughing/choking with swallowing concerning for HCAP vs aspiration pneumonia. Chest xray with RUL PNA. -Oxygen therapy to keep SpO2 >92% (pt at home on 3 L) -No blood cultures were drawn on admission, sputum cx ordered but not collected.  -Transitioned from IV vancomycin and zosyn after 2 days to PO augmentin 500-125 mg BID & doxycyline 100 mg BID. Continue on discharge to complete 7 days course on 02/01/15 -Continue tessalon 100 mg BID for cough -Awaiting swallow evaluation as -Pt needs repeat chest xray in 4-6 weeks to ensure resolution  Recurrent Falls - Etiology unclear, possibly due to ataxia from B12 deficiency. Pt also reports occasional palpitations concerning for PAF vs orthostatic hypotension in setting of malnutrition and low PO intake. 2D-echo on 11/27/14 with grade 1 diastolic dysfunction with no valvular dysfunction.  - monitored on tele, no arrythmia other than 5 beats on NSVT 2 nights ago. orthostatic vital signs negative (but that's after fluid admin) B12 was low. received IM B12 1000 mcg today  -continue PO B12 1000 mcg daily -Obtained dietician consult to assess nutrition requirement and SW consulted for meals on wheels  -consulted PT and OT, recommend home health -may Consider holter monitor as outpatient if symptoms don't improve with B12 correction, but will leave up  to PCP.   CKD Stage 3 - Cr 1.35 at baseline 1.2-1.3 with normal urine output.  -Avoid nephrotoxins  Oxygen-dependent COPD - Currently with no exacerbation. Pt at home on 3L O2.  -Continue spiriva 18 mcg daily and symbicort 2 puffs BID  -Continue albuterol nebulizer Q 4 hr PRN -Continue tessalon 100 mg BID for cough  B12 Deficiency - Level low at 121. Pt with recurrent falls possibly due to ataxia.  -Gave IM B12 1000 mcg, Started PO B12 1000 mcg daily -Checked intrinsic factor Ab - pending  Chronic anemia-- Hg 8.9 near baseline 9-11 with no active bleeding. MCV 90's. Anemia panel with probable mixed IDA with ferritin 38 and macrocytic anemia with B12 deficiency.  -Continue ferrous gluconate 324 mg daily (allergy to ferrous sulfate), replete B12 as above. -Pt needs outpatient screening colonoscopy   Hypertension - Currently hypertensive 160/72 -Continue home metoprolol 25 mg BID  -Increased norvasc 2.5 mg daily to 5 mg daily  - further titration of BP meds per PCP on follow up.   CAD - Pt with no anginal symptoms. Pt with stress test in 2013 that was negative for ischemia. Pt has moderate b/l ICA stenosis and receives carotid dopplers every 6 months.  -Continue aspirin 81mg  daily -Pt not on statin therapy at home   GERD - Currently with no symptoms.  -Continue home protonix 40 mg daily   Depression - Currently with stable mood.  -Continue home mirtazapine 30 mg daily and temazepam  Insomnia - Currently stable -Ambien 5 mg PRN   Discharge Vitals:   BP 130/70 mmHg  Pulse 72  Temp(Src) 98 F (36.7 C) (Oral)  Resp 20  Ht 5\' 10"  (1.778 m)  Wt 109 lb 6.4 oz (49.624 kg)  BMI 15.70 kg/m2  SpO2 100%  Discharge Labs:  No results found for this or any previous visit (from the past 24 hour(s)).  Signed: Hyacinth Meeker, MD 01/29/2015, 1:23 PM    Services Ordered on Discharge: home health PT/ot Equipment Ordered on Discharge:

## 2015-01-30 LAB — INTRINSIC FACTOR ANTIBODIES: Intrinsic Factor: 10.6 AU/mL — ABNORMAL HIGH (ref 0.0–1.1)

## 2015-05-03 ENCOUNTER — Emergency Department (HOSPITAL_COMMUNITY): Payer: Medicare Other

## 2015-05-03 ENCOUNTER — Encounter (HOSPITAL_COMMUNITY): Payer: Self-pay

## 2015-05-03 ENCOUNTER — Emergency Department (HOSPITAL_COMMUNITY)
Admission: EM | Admit: 2015-05-03 | Discharge: 2015-05-03 | Disposition: A | Discharge status: Home / Self Care | Payer: Medicare Other | Attending: Emergency Medicine | Admitting: Emergency Medicine

## 2015-05-03 DIAGNOSIS — Z8673 Personal history of transient ischemic attack (TIA), and cerebral infarction without residual deficits: Secondary | ICD-10-CM | POA: Insufficient documentation

## 2015-05-03 DIAGNOSIS — F0390 Unspecified dementia without behavioral disturbance: Secondary | ICD-10-CM | POA: Insufficient documentation

## 2015-05-03 DIAGNOSIS — Z79899 Other long term (current) drug therapy: Secondary | ICD-10-CM | POA: Insufficient documentation

## 2015-05-03 DIAGNOSIS — I2510 Atherosclerotic heart disease of native coronary artery without angina pectoris: Secondary | ICD-10-CM | POA: Diagnosis not present

## 2015-05-03 DIAGNOSIS — N183 Chronic kidney disease, stage 3 (moderate): Secondary | ICD-10-CM | POA: Diagnosis not present

## 2015-05-03 DIAGNOSIS — Z791 Long term (current) use of non-steroidal anti-inflammatories (NSAID): Secondary | ICD-10-CM | POA: Insufficient documentation

## 2015-05-03 DIAGNOSIS — I472 Ventricular tachycardia: Secondary | ICD-10-CM | POA: Insufficient documentation

## 2015-05-03 DIAGNOSIS — Z8711 Personal history of peptic ulcer disease: Secondary | ICD-10-CM | POA: Insufficient documentation

## 2015-05-03 DIAGNOSIS — J449 Chronic obstructive pulmonary disease, unspecified: Secondary | ICD-10-CM | POA: Insufficient documentation

## 2015-05-03 DIAGNOSIS — Z9861 Coronary angioplasty status: Secondary | ICD-10-CM | POA: Diagnosis not present

## 2015-05-03 DIAGNOSIS — R112 Nausea with vomiting, unspecified: Secondary | ICD-10-CM | POA: Diagnosis present

## 2015-05-03 DIAGNOSIS — Z87891 Personal history of nicotine dependence: Secondary | ICD-10-CM | POA: Diagnosis not present

## 2015-05-03 DIAGNOSIS — Z7951 Long term (current) use of inhaled steroids: Secondary | ICD-10-CM | POA: Insufficient documentation

## 2015-05-03 DIAGNOSIS — K529 Noninfective gastroenteritis and colitis, unspecified: Secondary | ICD-10-CM | POA: Diagnosis not present

## 2015-05-03 DIAGNOSIS — Z7982 Long term (current) use of aspirin: Secondary | ICD-10-CM | POA: Insufficient documentation

## 2015-05-03 DIAGNOSIS — I129 Hypertensive chronic kidney disease with stage 1 through stage 4 chronic kidney disease, or unspecified chronic kidney disease: Secondary | ICD-10-CM | POA: Insufficient documentation

## 2015-05-03 LAB — BASIC METABOLIC PANEL
Anion gap: 6 (ref 5–15)
BUN: 26 mg/dL — ABNORMAL HIGH (ref 6–20)
CALCIUM: 8.4 mg/dL — AB (ref 8.9–10.3)
CO2: 24 mmol/L (ref 22–32)
CREATININE: 1.32 mg/dL — AB (ref 0.61–1.24)
Chloride: 115 mmol/L — ABNORMAL HIGH (ref 101–111)
GFR calc Af Amer: 55 mL/min — ABNORMAL LOW (ref >60)
GFR, EST NON AFRICAN AMERICAN: 47 mL/min — AB (ref >60)
Glucose, Bld: 99 mg/dL (ref 65–99)
Potassium: 3.8 mmol/L (ref 3.5–5.1)
SODIUM: 145 mmol/L (ref 135–145)

## 2015-05-03 LAB — URINALYSIS, ROUTINE W REFLEX MICROSCOPIC
GLUCOSE, UA: NEGATIVE mg/dL
HGB URINE DIPSTICK: NEGATIVE
KETONES UR: NEGATIVE mg/dL
Leukocytes, UA: NEGATIVE
Nitrite: NEGATIVE
PROTEIN: NEGATIVE mg/dL
Specific Gravity, Urine: 1.024 (ref 1.005–1.030)
UROBILINOGEN UA: 0.2 mg/dL (ref 0.0–1.0)
pH: 5.5 (ref 5.0–8.0)

## 2015-05-03 LAB — CBC
HCT: 27.9 % — ABNORMAL LOW (ref 39.0–52.0)
Hemoglobin: 8.5 g/dL — ABNORMAL LOW (ref 13.0–17.0)
MCH: 28.1 pg (ref 26.0–34.0)
MCHC: 30.5 g/dL (ref 30.0–36.0)
MCV: 92.4 fL (ref 78.0–100.0)
PLATELETS: 200 10*3/uL (ref 150–400)
RBC: 3.02 MIL/uL — ABNORMAL LOW (ref 4.22–5.81)
RDW: 15.3 % (ref 11.5–15.5)
WBC: 7.2 10*3/uL (ref 4.0–10.5)

## 2015-05-03 LAB — CBG MONITORING, ED: Glucose-Capillary: 78 mg/dL (ref 65–99)

## 2015-05-03 MED ORDER — SODIUM CHLORIDE 0.9 % IV BOLUS (SEPSIS)
500.0000 mL | Freq: Once | INTRAVENOUS | Status: AC
  Administered 2015-05-03: 500 mL via INTRAVENOUS

## 2015-05-03 MED ORDER — ONDANSETRON HCL 4 MG PO TABS: 4.0000 mg | ORAL_TABLET | Freq: Four times a day (QID) | ORAL | Status: DC

## 2015-05-03 MED ORDER — ONDANSETRON HCL 4 MG/2ML IJ SOLN
2.0000 mg | Freq: Once | INTRAMUSCULAR | Status: AC
  Administered 2015-05-03: 2 mg via INTRAVENOUS
  Filled 2015-05-03: qty 2

## 2015-05-03 NOTE — ED Notes (Signed)
Bed: WA01 Expected date:  Expected time:  Means of arrival:  Comments: Ems-83 weakness

## 2015-05-03 NOTE — Discharge Instructions (Signed)
Medication for nausea.  Increase fluids.  Rest. °

## 2015-05-03 NOTE — ED Notes (Signed)
Per EMS- Patient is from home and has a nursing aide during the day. Patient c/o N/V, weakness, and dizziness x 4 days. Patient was given 300 ml NS prior to arrival to the ED.

## 2015-05-03 NOTE — ED Provider Notes (Signed)
CSN: [id number]     Arrival date & time 05/03/15  1419 History   First MD Initiated Contact with Patient 05/03/15 1535     Chief Complaint  Patient presents with  . Weakness  . Emesis  . Dizziness     (Consider location/radiation/quality/duration/timing/severity/associated sxs/prior Treatment) HPI....... level V caveat for mild dementia. Patient complains of nausea, vomiting, weakness for 2 days. He has been taking fluids. No fever, sweats, chills, dyspnea, chest pain. He lives at home with the help of a Agricultural engineer. Severity of symptoms moderate. No hematemesis or bloody stool.  Past Medical History  Diagnosis Date  . COPD (chronic obstructive pulmonary disease)   . CAD (coronary artery disease)     a.  nonobstructive CAD by cath 1999. b. Myoview 04/2012: fixed inferior and inferoseptal bowel attenuation artifact, no reversible ischemia. EF 62%. Low risk scan.  Marland Kitchen HTN (hypertension)   . PUD (peptic ulcer disease)     a. s/p surgery 1976.  Marland Kitchen Syncope     2012  . Carotid artery disease 03/22/13    a. 50-69% BICA by duplex 03/2013, repeat needed 11/2013.  Marland Kitchen PAD (peripheral artery disease)     a. prior stenting of RCIA 1999. b. history of 90% vertebral artery narrowing, 40% subclavian artery narrowing. c. Diagnostic PV angio 08/2013 for progressive claudication - will ultimately need endarterectomy/patch angio on bilat CFA stenosis; will also need atherectomy/PT/stenting to REIA.   . CKD (chronic kidney disease), stage III   . HLD (hyperlipidemia)   . Chronic respiratory failure     a. On home O2.  Marland Kitchen NSVT (nonsustained ventricular tachycardia)     a. Brief NSVT (6b) during 08/2013 adm.  . Stroke   . Shortness of breath   . On supplemental oxygen therapy     2l   Past Surgical History  Procedure Laterality Date  . Cataract extraction    . Gastrectomy    . Transurethral resection of prostate    . Coronary angioplasty with stent placement      stent in 2003; history of right  common iliac artery stent hx  . Endarterectomy femoral Bilateral 11/11/2013    Procedure: BILATERAL FEMORAL ENDARTERECTOMIES WITH BILATERAL PATCH ANGIOPLASTIES;  Surgeon: Nada Libman, MD;  Location: [facility] OR;  Service: Vascular;  Laterality: Bilateral;  . Lower extremity angiogram N/A 09/01/2013    Procedure: LOWER EXTREMITY ANGIOGRAM;  Surgeon: Runell Gess, MD;  Location: [facility] CATH LAB;  Service: Cardiovascular;  Laterality: N/A;   Family History  Problem Relation Age of Onset  . Hypertension    . Coronary artery disease    . Stroke Mother   . Heart disease Mother   . Hypertension Mother   . Heart attack Mother   . Heart attack Brother   . Heart disease Brother   . Heart disease Father   . Hypertension Father   . Heart attack Father    Social History  Substance Use Topics  . Smoking status: Former Smoker -- 1.00 packs/day for 60 years    Types: Cigarettes    Quit date: 01/06/1997  . Smokeless tobacco: Never Used  . Alcohol Use: No     Comment: hx etoh abuse in past, daughter states quit over 20 years ago    Review of Systems  Unable to perform ROS: Dementia      Allergies  Ferrous sulfate  Home Medications   Prior to Admission medications   Medication Sig Start Date End Date Taking? Authorizing Provider  albuterol (PROAIR HFA) 108 (90 BASE) MCG/ACT inhaler INHALE 2 PUFFS INTO THE LUNGS EVERY 4 HOURS AS NEEDED FOR WHEEZING OR SHORTNESS OF BREATH 04/24/15  Yes Historical Provider, MD  albuterol (PROVENTIL HFA;VENTOLIN HFA) 108 (90 BASE) MCG/ACT inhaler Inhale 2 puffs into the lungs every 4 (four) hours as needed for wheezing or shortness of breath. 06/13/14  Yes Renae Fickle, MD  amLODipine (NORVASC) 5 MG tablet Take 1 tablet (5 mg total) by mouth daily. 01/29/15  Yes Tasrif Ahmed, MD  aspirin EC 81 MG tablet Take 81 mg by mouth at bedtime.   Yes Historical Provider, MD  benzonatate (TESSALON) 100 MG capsule Take 1 capsule (100 mg total) by mouth 2 (two) times daily.  01/29/15  Yes Tasrif Ahmed, MD  budesonide-formoterol (SYMBICORT) 160-4.5 MCG/ACT inhaler Inhale 2 puffs into the lungs 2 (two) times daily.    Yes Historical Provider, MD  Cholecalciferol (VITAMIN D3) 2000 UNITS TABS Take 2,000 mg by mouth at bedtime.    Yes Historical Provider, MD  diclofenac (VOLTAREN) 75 MG EC tablet Take 75 mg by mouth 2 (two) times daily.  09/18/14 09/18/15 Yes Historical Provider, MD  ferrous gluconate (FERGON) 324 MG tablet Take 1 tablet (324 mg total) by mouth daily. 01/29/15  Yes Tasrif Ahmed, MD  metoprolol tartrate (LOPRESSOR) 25 MG tablet Take 25 mg by mouth 2 (two) times daily.    Yes Historical Provider, MD  mirtazapine (REMERON) 30 MG tablet Take 30 mg by mouth at bedtime.   Yes Historical Provider, MD  pantoprazole (PROTONIX) 40 MG tablet Take 40 mg by mouth 2 (two) times daily.   Yes Historical Provider, MD  simethicone (MYLICON) 80 MG chewable tablet Chew 1 tablet (80 mg total) by mouth every 6 (six) hours as needed for flatulence. 01/29/15  Yes Tasrif Ahmed, MD  temazepam (RESTORIL) 30 MG capsule Take 30 mg by mouth at bedtime. for sleep 12/15/14  Yes Historical Provider, MD  tiotropium (SPIRIVA HANDIHALER) 18 MCG inhalation capsule Place 1 capsule (18 mcg total) into inhaler and inhale daily. 06/13/14  Yes Renae Fickle, MD  vitamin B-12 1000 MCG tablet Take 1 tablet (1,000 mcg total) by mouth daily. 01/29/15  Yes Tasrif Ahmed, MD  albuterol (PROVENTIL) (2.5 MG/3ML) 0.083% nebulizer solution INHALE 1 VIAL VIA NEBULIZER EVERY 6 HOURS AS NEEDED FOR WHEEZING 04/23/15   Historical Provider, MD  feeding supplement, ENSURE ENLIVE, (ENSURE ENLIVE) LIQD Take 237 mLs by mouth daily. Patient not taking: Reported on 05/03/2015 01/29/15   Hyacinth Meeker, MD  nitroGLYCERIN (NITROSTAT) 0.4 MG SL tablet Place 1 tablet (0.4 mg total) under the tongue every 5 (five) minutes as needed for chest pain. 06/13/14   Renae Fickle, MD  ondansetron (ZOFRAN) 4 MG tablet Take 1 tablet (4 mg total)  by mouth every 6 (six) hours. 05/03/15   Donnetta Hutching, MD  OXYGEN-HELIUM IN Inhale 3 L into the lungs as needed. 2 liters    Historical Provider, MD  senna-docusate (SENOKOT-S) 8.6-50 MG per tablet Take 1 tablet by mouth at bedtime as needed for mild constipation. Patient not taking: Reported on 05/03/2015 01/29/15   Tasrif Ahmed, MD   BP 135/57 mmHg  Pulse 69  Temp(Src) 98.1 F (36.7 C) (Oral)  Resp 14  SpO2 100% Physical Exam  Constitutional:  Frail, nontoxic  HENT:  Head: Normocephalic and atraumatic.  Eyes: Conjunctivae and EOM are normal. Pupils are equal, round, and reactive to light.  Neck: Normal range of motion. Neck supple.  Cardiovascular: Normal rate and regular  rhythm.   Pulmonary/Chest: Effort normal and breath sounds normal.  Abdominal: Soft. Bowel sounds are normal.  Musculoskeletal: Normal range of motion.  Neurological: He is alert.  Skin: Skin is warm and dry.  Psychiatric: He has a normal mood and affect.  Nursing note and vitals reviewed.   ED Course  Procedures (including critical care time) Labs Review Labs Reviewed  BASIC METABOLIC PANEL - Abnormal; Notable for the following:    Chloride 115 (*)    BUN 26 (*)    Creatinine, Ser 1.32 (*)    Calcium 8.4 (*)    GFR calc non Af Amer 47 (*)    GFR calc Af Amer 55 (*)    All other components within normal limits  CBC - Abnormal; Notable for the following:    RBC 3.02 (*)    Hemoglobin 8.5 (*)    HCT 27.9 (*)    All other components within normal limits  URINALYSIS, ROUTINE W REFLEX MICROSCOPIC (NOT AT [facility]) - Abnormal; Notable for the following:    Color, Urine AMBER (*)    APPearance CLOUDY (*)    Bilirubin Urine SMALL (*)    All other components within normal limits  CBG MONITORING, ED    Imaging Review No results found. I have personally reviewed and evaluated these images and lab results as part of my medical decision-making.   EKG Interpretation   Date/Time:  Thursday May 03 2015  14:27:31 EDT Ventricular Rate:  73 PR Interval:  182 QRS Duration: 88 QT Interval:  543 QTC Calculation: 598 R Axis:   45 Text Interpretation:  Sinus arrhythmia Repol abnrm suggests ischemia,  anterolateral Prolonged QT interval Confirmed by Adriana Simas  MD, Mahira Petras ([id number])  on 05/03/2015 4:50:57 PM      MDM   Final diagnoses:  Gastroenteritis    Patient feels much better after IV fluids and IV Zofran.  Screening labs show anemia which is not a new finding. Glucose and potassium normal. Urinalysis shows no evidence of infection. Discharge medications Zofran 4 mg    Donnetta Hutching, MD 05/06/15 0800

## 2015-05-03 NOTE — Progress Notes (Signed)
CSW was consulted by nurse to speak with patient regarding transportation issues.  CSW reached out to patient's wife. Wife gave CSW information for a church member names Ginger/ [phone] and [phone].   CSW spoke with Ginger who states that she will be able to pick up patient upon discharge and take him home.   CSW made patient, wife, and nurse aware that Ginger will be picking up the patient.   Trish Mage [phone] ED CSW 05/03/2015 7:03 PM

## 2015-05-15 ENCOUNTER — Other Ambulatory Visit (HOSPITAL_COMMUNITY): Payer: Self-pay | Admitting: Family Medicine

## 2015-05-15 DIAGNOSIS — R112 Nausea with vomiting, unspecified: Secondary | ICD-10-CM

## 2015-05-17 ENCOUNTER — Other Ambulatory Visit (HOSPITAL_COMMUNITY): Payer: Self-pay | Admitting: Family Medicine

## 2015-05-17 ENCOUNTER — Ambulatory Visit (HOSPITAL_COMMUNITY)
Admission: RE | Admit: 2015-05-17 | Discharge: 2015-05-17 | Disposition: A | Discharge status: Home / Self Care | Payer: Medicare Other | Source: Ambulatory Visit | Attending: Family Medicine | Admitting: Family Medicine

## 2015-05-17 DIAGNOSIS — R112 Nausea with vomiting, unspecified: Secondary | ICD-10-CM

## 2015-05-17 DIAGNOSIS — K219 Gastro-esophageal reflux disease without esophagitis: Secondary | ICD-10-CM | POA: Diagnosis not present

## 2015-05-17 DIAGNOSIS — R634 Abnormal weight loss: Secondary | ICD-10-CM | POA: Diagnosis not present

## 2015-05-21 ENCOUNTER — Inpatient Hospital Stay (HOSPITAL_COMMUNITY)
Admission: EM | Admit: 2015-05-21 | Discharge: 2015-05-25 | DRG: 380 | Disposition: A | Discharge status: Skilled Nursing Facility | Payer: Medicare Other | Attending: Internal Medicine | Admitting: Internal Medicine

## 2015-05-21 ENCOUNTER — Encounter (HOSPITAL_COMMUNITY): Payer: Self-pay

## 2015-05-21 ENCOUNTER — Emergency Department (HOSPITAL_COMMUNITY): Payer: Medicare Other

## 2015-05-21 ENCOUNTER — Observation Stay (HOSPITAL_COMMUNITY): Payer: Medicare Other

## 2015-05-21 DIAGNOSIS — D649 Anemia, unspecified: Secondary | ICD-10-CM | POA: Diagnosis present

## 2015-05-21 DIAGNOSIS — Z8673 Personal history of transient ischemic attack (TIA), and cerebral infarction without residual deficits: Secondary | ICD-10-CM | POA: Diagnosis not present

## 2015-05-21 DIAGNOSIS — Z7982 Long term (current) use of aspirin: Secondary | ICD-10-CM

## 2015-05-21 DIAGNOSIS — R079 Chest pain, unspecified: Secondary | ICD-10-CM | POA: Diagnosis not present

## 2015-05-21 DIAGNOSIS — I739 Peripheral vascular disease, unspecified: Secondary | ICD-10-CM | POA: Diagnosis present

## 2015-05-21 DIAGNOSIS — E869 Volume depletion, unspecified: Secondary | ICD-10-CM | POA: Diagnosis present

## 2015-05-21 DIAGNOSIS — Z681 Body mass index (BMI) 19 or less, adult: Secondary | ICD-10-CM

## 2015-05-21 DIAGNOSIS — IMO0001 Reserved for inherently not codable concepts without codable children: Secondary | ICD-10-CM | POA: Diagnosis present

## 2015-05-21 DIAGNOSIS — I10 Essential (primary) hypertension: Secondary | ICD-10-CM

## 2015-05-21 DIAGNOSIS — E43 Unspecified severe protein-calorie malnutrition: Secondary | ICD-10-CM | POA: Diagnosis present

## 2015-05-21 DIAGNOSIS — J449 Chronic obstructive pulmonary disease, unspecified: Secondary | ICD-10-CM | POA: Diagnosis present

## 2015-05-21 DIAGNOSIS — Z66 Do not resuscitate: Secondary | ICD-10-CM | POA: Diagnosis present

## 2015-05-21 DIAGNOSIS — R05 Cough: Secondary | ICD-10-CM

## 2015-05-21 DIAGNOSIS — M1990 Unspecified osteoarthritis, unspecified site: Secondary | ICD-10-CM | POA: Diagnosis present

## 2015-05-21 DIAGNOSIS — R112 Nausea with vomiting, unspecified: Secondary | ICD-10-CM | POA: Diagnosis present

## 2015-05-21 DIAGNOSIS — R531 Weakness: Secondary | ICD-10-CM

## 2015-05-21 DIAGNOSIS — Z8711 Personal history of peptic ulcer disease: Secondary | ICD-10-CM | POA: Diagnosis not present

## 2015-05-21 DIAGNOSIS — E785 Hyperlipidemia, unspecified: Secondary | ICD-10-CM | POA: Diagnosis present

## 2015-05-21 DIAGNOSIS — R778 Other specified abnormalities of plasma proteins: Secondary | ICD-10-CM | POA: Diagnosis present

## 2015-05-21 DIAGNOSIS — Z9981 Dependence on supplemental oxygen: Secondary | ICD-10-CM

## 2015-05-21 DIAGNOSIS — I2510 Atherosclerotic heart disease of native coronary artery without angina pectoris: Secondary | ICD-10-CM | POA: Diagnosis not present

## 2015-05-21 DIAGNOSIS — J438 Other emphysema: Secondary | ICD-10-CM | POA: Diagnosis not present

## 2015-05-21 DIAGNOSIS — J9611 Chronic respiratory failure with hypoxia: Secondary | ICD-10-CM | POA: Diagnosis present

## 2015-05-21 DIAGNOSIS — N183 Chronic kidney disease, stage 3 unspecified: Secondary | ICD-10-CM | POA: Diagnosis present

## 2015-05-21 DIAGNOSIS — K283 Acute gastrojejunal ulcer without hemorrhage or perforation: Principal | ICD-10-CM | POA: Diagnosis present

## 2015-05-21 DIAGNOSIS — I779 Disorder of arteries and arterioles, unspecified: Secondary | ICD-10-CM | POA: Diagnosis present

## 2015-05-21 DIAGNOSIS — K2210 Ulcer of esophagus without bleeding: Secondary | ICD-10-CM | POA: Diagnosis not present

## 2015-05-21 DIAGNOSIS — I248 Other forms of acute ischemic heart disease: Secondary | ICD-10-CM | POA: Diagnosis present

## 2015-05-21 DIAGNOSIS — Z8249 Family history of ischemic heart disease and other diseases of the circulatory system: Secondary | ICD-10-CM | POA: Diagnosis not present

## 2015-05-21 DIAGNOSIS — R627 Adult failure to thrive: Secondary | ICD-10-CM | POA: Diagnosis present

## 2015-05-21 DIAGNOSIS — Z791 Long term (current) use of non-steroidal anti-inflammatories (NSAID): Secondary | ICD-10-CM

## 2015-05-21 DIAGNOSIS — Z823 Family history of stroke: Secondary | ICD-10-CM | POA: Diagnosis not present

## 2015-05-21 DIAGNOSIS — I129 Hypertensive chronic kidney disease with stage 1 through stage 4 chronic kidney disease, or unspecified chronic kidney disease: Secondary | ICD-10-CM | POA: Diagnosis present

## 2015-05-21 DIAGNOSIS — R64 Cachexia: Secondary | ICD-10-CM | POA: Diagnosis present

## 2015-05-21 DIAGNOSIS — R1110 Vomiting, unspecified: Secondary | ICD-10-CM

## 2015-05-21 DIAGNOSIS — R7989 Other specified abnormal findings of blood chemistry: Secondary | ICD-10-CM | POA: Diagnosis present

## 2015-05-21 DIAGNOSIS — Z903 Acquired absence of stomach [part of]: Secondary | ICD-10-CM

## 2015-05-21 DIAGNOSIS — Z87891 Personal history of nicotine dependence: Secondary | ICD-10-CM | POA: Diagnosis not present

## 2015-05-21 DIAGNOSIS — R059 Cough, unspecified: Secondary | ICD-10-CM

## 2015-05-21 LAB — COMPREHENSIVE METABOLIC PANEL
ALT: 8 U/L — ABNORMAL LOW (ref 17–63)
ANION GAP: 5 (ref 5–15)
AST: 9 U/L — ABNORMAL LOW (ref 15–41)
Albumin: 1.7 g/dL — ABNORMAL LOW (ref 3.5–5.0)
Alkaline Phosphatase: 75 U/L (ref 38–126)
BILIRUBIN TOTAL: 0.3 mg/dL (ref 0.3–1.2)
BUN: 19 mg/dL (ref 6–20)
CO2: 25 mmol/L (ref 22–32)
Calcium: 8 mg/dL — ABNORMAL LOW (ref 8.9–10.3)
Chloride: 109 mmol/L (ref 101–111)
Creatinine, Ser: 1.3 mg/dL — ABNORMAL HIGH (ref 0.61–1.24)
GFR calc Af Amer: 56 mL/min — ABNORMAL LOW (ref >60)
GFR, EST NON AFRICAN AMERICAN: 48 mL/min — AB (ref >60)
Glucose, Bld: 99 mg/dL (ref 65–99)
POTASSIUM: 3.3 mmol/L — AB (ref 3.5–5.1)
Sodium: 139 mmol/L (ref 135–145)
TOTAL PROTEIN: 4.4 g/dL — AB (ref 6.5–8.1)

## 2015-05-21 LAB — LIPASE, BLOOD: LIPASE: 22 U/L (ref 22–51)

## 2015-05-21 LAB — CBC WITH DIFFERENTIAL/PLATELET
Basophils Absolute: 0 10*3/uL (ref 0.0–0.1)
Basophils Relative: 0 %
Eosinophils Absolute: 0.3 10*3/uL (ref 0.0–0.7)
Eosinophils Relative: 4 %
HEMATOCRIT: 27.7 % — AB (ref 39.0–52.0)
Hemoglobin: 8.3 g/dL — ABNORMAL LOW (ref 13.0–17.0)
LYMPHS PCT: 9 %
Lymphs Abs: 0.7 10*3/uL (ref 0.7–4.0)
MCH: 27.1 pg (ref 26.0–34.0)
MCHC: 30 g/dL (ref 30.0–36.0)
MCV: 90.5 fL (ref 78.0–100.0)
MONO ABS: 0.6 10*3/uL (ref 0.1–1.0)
MONOS PCT: 8 %
NEUTROS ABS: 5.9 10*3/uL (ref 1.7–7.7)
Neutrophils Relative %: 79 %
Platelets: 315 10*3/uL (ref 150–400)
RBC: 3.06 MIL/uL — ABNORMAL LOW (ref 4.22–5.81)
RDW: 15.1 % (ref 11.5–15.5)
WBC: 7.6 10*3/uL (ref 4.0–10.5)

## 2015-05-21 LAB — URINALYSIS, ROUTINE W REFLEX MICROSCOPIC
Glucose, UA: NEGATIVE mg/dL
Hgb urine dipstick: NEGATIVE
KETONES UR: 15 mg/dL — AB
Leukocytes, UA: NEGATIVE
NITRITE: NEGATIVE
PH: 5.5 (ref 5.0–8.0)
Protein, ur: 30 mg/dL — AB
Specific Gravity, Urine: 1.026 (ref 1.005–1.030)
UROBILINOGEN UA: 1 mg/dL (ref 0.0–1.0)

## 2015-05-21 LAB — I-STAT CG4 LACTIC ACID, ED: LACTIC ACID, VENOUS: 0.62 mmol/L (ref 0.5–2.0)

## 2015-05-21 LAB — APTT: aPTT: 36 seconds (ref 24–37)

## 2015-05-21 LAB — POC OCCULT BLOOD, ED: FECAL OCCULT BLD: NEGATIVE

## 2015-05-21 LAB — TROPONIN I
TROPONIN I: 0.03 ng/mL (ref <0.031)
Troponin I: 0.04 ng/mL — ABNORMAL HIGH (ref <0.031)

## 2015-05-21 LAB — PROTIME-INR
INR: 1.16 (ref 0.00–1.49)
PROTHROMBIN TIME: 15 s (ref 11.6–15.2)

## 2015-05-21 LAB — URINE MICROSCOPIC-ADD ON

## 2015-05-21 MED ORDER — SODIUM CHLORIDE 0.9 % IV SOLN
1000.0000 mL | Freq: Once | INTRAVENOUS | Status: AC
  Administered 2015-05-21: 1000 mL via INTRAVENOUS

## 2015-05-21 MED ORDER — HEPARIN SODIUM (PORCINE) 5000 UNIT/ML IJ SOLN
5000.0000 [IU] | Freq: Three times a day (TID) | INTRAMUSCULAR | Status: DC
  Administered 2015-05-21 – 2015-05-23 (×5): 5000 [IU] via SUBCUTANEOUS
  Filled 2015-05-21 (×5): qty 1

## 2015-05-21 MED ORDER — ONDANSETRON HCL 4 MG/2ML IJ SOLN
4.0000 mg | Freq: Three times a day (TID) | INTRAMUSCULAR | Status: DC | PRN
Start: 2015-05-21 — End: 2015-05-25

## 2015-05-21 MED ORDER — SODIUM CHLORIDE 0.9 % IV SOLN
1000.0000 mL | INTRAVENOUS | Status: DC
  Administered 2015-05-21: 1000 mL via INTRAVENOUS

## 2015-05-21 MED ORDER — MIRTAZAPINE 15 MG PO TABS
30.0000 mg | ORAL_TABLET | Freq: Every day | ORAL | Status: DC
  Administered 2015-05-21 – 2015-05-23 (×3): 30 mg via ORAL
  Filled 2015-05-21 (×2): qty 1
  Filled 2015-05-21 (×2): qty 2
  Filled 2015-05-21: qty 1
  Filled 2015-05-21: qty 2

## 2015-05-21 MED ORDER — TEMAZEPAM 15 MG PO CAPS
30.0000 mg | ORAL_CAPSULE | Freq: Every day | ORAL | Status: DC
  Administered 2015-05-21 – 2015-05-24 (×3): 30 mg via ORAL
  Filled 2015-05-21 (×3): qty 2

## 2015-05-21 MED ORDER — ATORVASTATIN CALCIUM 40 MG PO TABS
40.0000 mg | ORAL_TABLET | Freq: Every day | ORAL | Status: DC
  Administered 2015-05-21 – 2015-05-24 (×4): 40 mg via ORAL
  Filled 2015-05-21 (×4): qty 1

## 2015-05-21 MED ORDER — GUAIFENESIN ER 600 MG PO TB12
600.0000 mg | ORAL_TABLET | Freq: Two times a day (BID) | ORAL | Status: DC
  Administered 2015-05-21 – 2015-05-25 (×7): 600 mg via ORAL
  Filled 2015-05-21 (×7): qty 1

## 2015-05-21 MED ORDER — ONDANSETRON HCL 4 MG/2ML IJ SOLN: 4.0000 mg | Freq: Once | INTRAMUSCULAR | Status: DC

## 2015-05-21 MED ORDER — MORPHINE SULFATE (PF) 2 MG/ML IV SOLN: 2.0000 mg | INTRAVENOUS | Status: DC | PRN

## 2015-05-21 MED ORDER — DICLOFENAC SODIUM 75 MG PO TBEC
75.0000 mg | DELAYED_RELEASE_TABLET | Freq: Two times a day (BID) | ORAL | Status: DC
  Administered 2015-05-22: 75 mg via ORAL
  Filled 2015-05-21 (×3): qty 1

## 2015-05-21 MED ORDER — ALBUTEROL SULFATE (2.5 MG/3ML) 0.083% IN NEBU: 2.5000 mg | INHALATION_SOLUTION | RESPIRATORY_TRACT | Status: DC | PRN

## 2015-05-21 MED ORDER — BUDESONIDE-FORMOTEROL FUMARATE 160-4.5 MCG/ACT IN AERO
2.0000 | INHALATION_SPRAY | Freq: Two times a day (BID) | RESPIRATORY_TRACT | Status: DC
  Administered 2015-05-22 – 2015-05-25 (×6): 2 via RESPIRATORY_TRACT
  Filled 2015-05-21: qty 6

## 2015-05-21 MED ORDER — VITAMIN D 1000 UNITS PO TABS
2000.0000 [IU] | ORAL_TABLET | Freq: Every day | ORAL | Status: DC
  Administered 2015-05-21 – 2015-05-24 (×4): 2000 [IU] via ORAL
  Filled 2015-05-21 (×4): qty 2

## 2015-05-21 MED ORDER — ACETAMINOPHEN 325 MG PO TABS
650.0000 mg | ORAL_TABLET | Freq: Four times a day (QID) | ORAL | Status: DC | PRN
  Administered 2015-05-22 – 2015-05-24 (×2): 650 mg via ORAL
  Filled 2015-05-21 (×2): qty 2

## 2015-05-21 MED ORDER — ASPIRIN EC 81 MG PO TBEC
81.0000 mg | DELAYED_RELEASE_TABLET | Freq: Every day | ORAL | Status: DC
  Administered 2015-05-21 – 2015-05-24 (×4): 81 mg via ORAL
  Filled 2015-05-21 (×4): qty 1

## 2015-05-21 MED ORDER — SODIUM CHLORIDE 0.9 % IV SOLN
INTRAVENOUS | Status: DC
  Administered 2015-05-21: via INTRAVENOUS

## 2015-05-21 MED ORDER — IOHEXOL 300 MG/ML  SOLN
100.0000 mL | Freq: Once | INTRAMUSCULAR | Status: AC | PRN
  Administered 2015-05-21: 100 mL via INTRAVENOUS

## 2015-05-21 MED ORDER — TIOTROPIUM BROMIDE MONOHYDRATE 18 MCG IN CAPS
18.0000 ug | ORAL_CAPSULE | Freq: Every day | RESPIRATORY_TRACT | Status: DC
  Administered 2015-05-22 – 2015-05-25 (×3): 18 ug via RESPIRATORY_TRACT
  Filled 2015-05-21: qty 5

## 2015-05-21 MED ORDER — ACETAMINOPHEN 650 MG RE SUPP: 650.0000 mg | Freq: Four times a day (QID) | RECTAL | Status: DC | PRN

## 2015-05-21 MED ORDER — NITROGLYCERIN 0.4 MG SL SUBL: 0.4000 mg | SUBLINGUAL_TABLET | SUBLINGUAL | Status: DC | PRN

## 2015-05-21 MED ORDER — VITAMIN B-12 1000 MCG PO TABS
1000.0000 ug | ORAL_TABLET | Freq: Every day | ORAL | Status: DC
  Administered 2015-05-22 – 2015-05-25 (×3): 1000 ug via ORAL
  Filled 2015-05-21 (×3): qty 1

## 2015-05-21 MED ORDER — VITAMIN D3 50 MCG (2000 UT) PO TABS: 2000.0000 mg | ORAL_TABLET | Freq: Every day | ORAL | Status: DC

## 2015-05-21 MED ORDER — PANTOPRAZOLE SODIUM 40 MG IV SOLR
40.0000 mg | INTRAVENOUS | Status: DC
  Administered 2015-05-21 – 2015-05-23 (×3): 40 mg via INTRAVENOUS
  Filled 2015-05-21 (×3): qty 40

## 2015-05-21 MED ORDER — METOPROLOL TARTRATE 25 MG PO TABS
25.0000 mg | ORAL_TABLET | Freq: Two times a day (BID) | ORAL | Status: DC
  Administered 2015-05-21 – 2015-05-25 (×7): 25 mg via ORAL
  Filled 2015-05-21 (×7): qty 1

## 2015-05-21 NOTE — ED Provider Notes (Signed)
CSN: [id number]     Arrival date & time 05/21/15  1634 History   First MD Initiated Contact with Patient 05/21/15 1640     Chief Complaint  Patient presents with  . Emesis  . Dizziness     (Consider location/radiation/quality/duration/timing/severity/associated sxs/prior Treatment) Patient is a 81 y.o. male presenting with vomiting.  Emesis Severity:  Mild Duration:  3 months Timing:  Constant Quality:  Stomach contents Able to tolerate:  Liquids Progression:  Worsening Chronicity:  New Recent urination:  Normal Context: not post-tussive and not self-induced   Relieved by:  Nothing Worsened by:  Nothing tried Ineffective treatments:  None tried Associated symptoms: no abdominal pain, no chills, no cough, no fever and no headaches   Risk factors: no alcohol use, no suspect food intake and no travel to endemic areas     Past Medical History  Diagnosis Date  . COPD (chronic obstructive pulmonary disease) (HCC)   . CAD (coronary artery disease)     a.  nonobstructive CAD by cath 1999. b. Myoview 04/2012: fixed inferior and inferoseptal bowel attenuation artifact, no reversible ischemia. EF 62%. Low risk scan.  Marland Kitchen HTN (hypertension)   . PUD (peptic ulcer disease)     a. s/p surgery 1976.  Marland Kitchen Syncope     2012  . Carotid artery disease (HCC) 03/22/13    a. 50-69% BICA by duplex 03/2013, repeat needed 11/2013.  Marland Kitchen PAD (peripheral artery disease) (HCC)     a. prior stenting of RCIA 1999. b. history of 90% vertebral artery narrowing, 40% subclavian artery narrowing. c. Diagnostic PV angio 08/2013 for progressive claudication - will ultimately need endarterectomy/patch angio on bilat CFA stenosis; will also need atherectomy/PT/stenting to REIA.   . CKD (chronic kidney disease), stage III   . HLD (hyperlipidemia)   . Chronic respiratory failure (HCC)     a. On home O2.  Marland Kitchen NSVT (nonsustained ventricular tachycardia) (HCC)     a. Brief NSVT (6b) during 08/2013 adm.  . Stroke (HCC)   .  Shortness of breath   . On supplemental oxygen therapy     2l   Past Surgical History  Procedure Laterality Date  . Cataract extraction    . Gastrectomy    . Transurethral resection of prostate    . Coronary angioplasty with stent placement      stent in 2003; history of right common iliac artery stent hx  . Endarterectomy femoral Bilateral 11/11/2013    Procedure: BILATERAL FEMORAL ENDARTERECTOMIES WITH BILATERAL PATCH ANGIOPLASTIES;  Surgeon: Nada Libman, MD;  Location: [facility] OR;  Service: Vascular;  Laterality: Bilateral;  . Lower extremity angiogram N/A 09/01/2013    Procedure: LOWER EXTREMITY ANGIOGRAM;  Surgeon: Runell Gess, MD;  Location: [facility] CATH LAB;  Service: Cardiovascular;  Laterality: N/A;   Family History  Problem Relation Age of Onset  . Hypertension    . Coronary artery disease    . Stroke Mother   . Heart disease Mother   . Hypertension Mother   . Heart attack Mother   . Heart attack Brother   . Heart disease Brother   . Heart disease Father   . Hypertension Father   . Heart attack Father    Social History  Substance Use Topics  . Smoking status: Former Smoker -- 1.00 packs/day for 60 years    Types: Cigarettes    Quit date: 01/06/1997  . Smokeless tobacco: Never Used  . Alcohol Use: No     Comment: hx etoh  abuse in past, daughter states quit over 20 years ago    Review of Systems  Constitutional: Positive for activity change, appetite change, fatigue and unexpected weight change. Negative for chills.  Eyes: Negative for photophobia and pain.  Respiratory: Negative for cough and shortness of breath.   Gastrointestinal: Positive for nausea and vomiting. Negative for abdominal pain.  Neurological: Negative for headaches.      Allergies  Ferrous sulfate  Home Medications   Prior to Admission medications   Medication Sig Start Date End Date Taking? Authorizing Provider  albuterol (PROVENTIL HFA;VENTOLIN HFA) 108 (90 BASE) MCG/ACT inhaler Inhale  2 puffs into the lungs every 4 (four) hours as needed for wheezing or shortness of breath. 06/13/14  Yes Renae Fickle, MD  albuterol (PROVENTIL) (2.5 MG/3ML) 0.083% nebulizer solution INHALE 1 VIAL VIA NEBULIZER EVERY 6 HOURS AS NEEDED FOR WHEEZING 04/23/15  Yes Historical Provider, MD  aspirin EC 81 MG tablet Take 81 mg by mouth at bedtime.   Yes Historical Provider, MD  budesonide-formoterol (SYMBICORT) 160-4.5 MCG/ACT inhaler Inhale 2 puffs into the lungs 2 (two) times daily.    Yes Historical Provider, MD  Cholecalciferol (VITAMIN D3) 2000 UNITS TABS Take 2,000 mg by mouth at bedtime.    Yes Historical Provider, MD  diclofenac (VOLTAREN) 75 MG EC tablet Take 75 mg by mouth 2 (two) times daily.  09/18/14 09/18/15 Yes Historical Provider, MD  metoprolol tartrate (LOPRESSOR) 25 MG tablet Take 25 mg by mouth 2 (two) times daily.    Yes Historical Provider, MD  mirtazapine (REMERON) 30 MG tablet Take 30 mg by mouth at bedtime.   Yes Historical Provider, MD  nitroGLYCERIN (NITROSTAT) 0.4 MG SL tablet Place 1 tablet (0.4 mg total) under the tongue every 5 (five) minutes as needed for chest pain. 06/13/14  Yes Renae Fickle, MD  OXYGEN-HELIUM IN Inhale 3 L into the lungs daily. 2 liters   Yes Historical Provider, MD  pantoprazole (PROTONIX) 40 MG tablet Take 40 mg by mouth 2 (two) times daily.   Yes Historical Provider, MD  simethicone (MYLICON) 80 MG chewable tablet Chew 1 tablet (80 mg total) by mouth every 6 (six) hours as needed for flatulence. 01/29/15  Yes Tasrif Ahmed, MD  temazepam (RESTORIL) 30 MG capsule Take 30 mg by mouth at bedtime. for sleep 12/15/14  Yes Historical Provider, MD  tiotropium (SPIRIVA HANDIHALER) 18 MCG inhalation capsule Place 1 capsule (18 mcg total) into inhaler and inhale daily. 06/13/14  Yes Renae Fickle, MD  vitamin B-12 1000 MCG tablet Take 1 tablet (1,000 mcg total) by mouth daily. 01/29/15  Yes Tasrif Ahmed, MD  amLODipine (NORVASC) 5 MG tablet Take 1 tablet (5 mg  total) by mouth daily. Patient not taking: Reported on 05/21/2015 01/29/15   Hyacinth Meeker, MD  benzonatate (TESSALON) 100 MG capsule Take 1 capsule (100 mg total) by mouth 2 (two) times daily. Patient not taking: Reported on 05/21/2015 01/29/15   Hyacinth Meeker, MD  feeding supplement, ENSURE ENLIVE, (ENSURE ENLIVE) LIQD Take 237 mLs by mouth daily. Patient not taking: Reported on 05/03/2015 01/29/15   Hyacinth Meeker, MD  ferrous gluconate (FERGON) 324 MG tablet Take 1 tablet (324 mg total) by mouth daily. Patient not taking: Reported on 05/21/2015 01/29/15   Hyacinth Meeker, MD  ondansetron (ZOFRAN) 4 MG tablet Take 1 tablet (4 mg total) by mouth every 6 (six) hours. Patient not taking: Reported on 05/21/2015 05/03/15   Donnetta Hutching, MD  senna-docusate (SENOKOT-S) 8.6-50 MG per tablet Take 1  tablet by mouth at bedtime as needed for mild constipation. Patient not taking: Reported on 05/03/2015 01/29/15   Tasrif Ahmed, MD   BP 153/54 mmHg  Pulse 69  Temp(Src) 98.7 F (37.1 C) (Oral)  Resp 18  Ht  (1.778 m)  Wt 100 lb 12.8 oz (45.723 kg)  BMI 14.46 kg/m2  SpO2 100% Physical Exam  Constitutional: He is oriented to person, place, and time. He appears well-developed. He appears cachectic.  HENT:  Head: Normocephalic and atraumatic.  Eyes: Conjunctivae and EOM are normal.  Neck: Normal range of motion. Neck supple.  Cardiovascular: Normal rate and regular rhythm.   Pulmonary/Chest: Effort normal. No respiratory distress.  Abdominal: Soft. There is no tenderness.  Musculoskeletal: Normal range of motion. He exhibits no edema or tenderness.  Neurological: He is alert and oriented to person, place, and time.  Skin: Skin is warm and dry.  Nursing note and vitals reviewed.   ED Course  Procedures (including critical care time) Labs Review Labs Reviewed  MRSA PCR SCREENING - Abnormal; Notable for the following:    MRSA by PCR POSITIVE (*)    All other components within normal limits  CBC WITH  DIFFERENTIAL/PLATELET - Abnormal; Notable for the following:    RBC 3.06 (*)    Hemoglobin 8.3 (*)    HCT 27.7 (*)    All other components within normal limits  COMPREHENSIVE METABOLIC PANEL - Abnormal; Notable for the following:    Potassium 3.3 (*)    Creatinine, Ser 1.30 (*)    Calcium 8.0 (*)    Total Protein 4.4 (*)    Albumin 1.7 (*)    AST 9 (*)    ALT 8 (*)    GFR calc non Af Amer 48 (*)    GFR calc Af Amer 56 (*)    All other components within normal limits  URINALYSIS, ROUTINE W REFLEX MICROSCOPIC (NOT AT [facility]) - Abnormal; Notable for the following:    Color, Urine AMBER (*)    Bilirubin Urine MODERATE (*)    Ketones, ur 15 (*)    Protein, ur 30 (*)    All other components within normal limits  TROPONIN I - Abnormal; Notable for the following:    Troponin I 0.04 (*)    All other components within normal limits  URINE MICROSCOPIC-ADD ON - Abnormal; Notable for the following:    Casts HYALINE CASTS (*)    All other components within normal limits  LIPASE, BLOOD  TROPONIN I  PROTIME-INR  APTT  OCCULT BLOOD X 1 CARD TO LAB, STOOL  BRAIN NATRIURETIC PEPTIDE  TROPONIN I  TROPONIN I  HEMOGLOBIN A1C  LIPID PANEL  BASIC METABOLIC PANEL  CBC  I-STAT CG4 LACTIC ACID, ED  POC OCCULT BLOOD, ED    Imaging Review Dg Chest Port 1 View  05/21/2015   CLINICAL DATA:  Acute onset of cough.  Initial encounter.  EXAM: PORTABLE CHEST 1 VIEW  COMPARISON:  Chest radiograph performed earlier today at 7:37 p.m.  FINDINGS: Increased interstitial markings appear to be chronic in nature, with underlying scarring and emphysematous change. No pleural effusion or pneumothorax is seen.  The cardiomediastinal silhouette is normal in size. No acute osseous abnormalities are identified.  IMPRESSION: Chronic lung changes noted, with scarring and emphysematous change. No definite acute airspace consolidation seen.   Electronically Signed   By: Roanna Raider M.D.   On: 05/21/2015 22:49   Dg Abd  Acute W/chest  05/21/2015   CLINICAL  DATA:  Nausea and vomiting and dizziness.  EXAM: DG ABDOMEN ACUTE W/ 1V CHEST  COMPARISON:  05/17/2015, 05/03/2015  FINDINGS: Hyperinflation evident with mild interstitial prominence, suspect background COPD/emphysema. Normal heart size and vascularity. Bibasilar atelectasis versus scarring as before. No new superimposed pneumonia, collapse or consolidation. No effusion or pneumothorax. No significant edema pattern. Stable mild left hilar prominence, suspect vascular markings. Atherosclerosis of the aorta. Bones are osteopenic. Degenerative changes of the spine.  Large amount retained barium throughout the entire colon. No obstruction pattern. No significant ileus. Aortoiliac extensive atherosclerosis. No free air on the decubitus view.  IMPRESSION: Stable chronic COPD/ emphysema and parenchymal scarring.  Large volume of retained barium throughout the colon. Negative for obstruction or free air.  Aortoiliac atherosclerosis  Osteopenia and degenerative changes of the spine.   Electronically Signed   By: Judie Petit.  Shick M.D.   On: 05/21/2015 20:03   I have personally reviewed and evaluated these images and lab results as part of my medical decision-making.   EKG Interpretation   Date/Time:  Monday May 21 2015 18:32:16 EDT Ventricular Rate:  66 PR Interval:  180 QRS Duration: 90 QT Interval:  386 QTC Calculation: 404 R Axis:   51 Text Interpretation:  Sinus rhythm Multiple premature complexes, vent   Nonspecific repol abnormality, diffuse leads similar to ECG 9/26 Confirmed  by [facility] MD, Barbara Cower [phone]) on 05/21/2015 6:34:49 PM      MDM   Final diagnoses:  Coughing   81 year old male here with 3 months of progressively worsening nausea and vomiting approximately 2 hours after eating. Has lost anywhere from 30-40 pounds during that time. Has gotten the parents followed by his primary doctor which was reportedly negative. On exam his emaciated but no other acute  abnormality. Labs are all normal except for hypoalbuminemia, elevated troponin. EKG without any evidence of acute changes. To the get a CT scan to look for neoplasm or other causes for obstruction however he still had barium from his swallow study 4 days ago which would limit visualization so plain films done instead without evidence of obstruction. Discussed case with medicine for admission for bowel cleanout and CT scan to evaluate for cancer.      Marily Memos, MD 05/22/15 [phone]

## 2015-05-21 NOTE — ED Notes (Addendum)
Pt. Presents from PCP office with complaint of nausea/vomiting and dizziness x 1 month. Pt. Prescribed zofran by PCP with little improvement. Pt. States he is unable to tolerate PO fluids/food/meds. Pt. States he has lost 7 lbs in a month. Pt. Denies pain. Denies diarrhea. Pt. On home O2 PRN, sats 96-98% on RA.  Pt. Given  zofran in route.

## 2015-05-21 NOTE — Progress Notes (Signed)
Alec Walker [id number] Admission Data: 05/21/2015 11:05 PM Attending Provider: Lorretta Harp, MD WUJ:[id number],NWGNFAO C, MD Code Status: DNR  Alec Walker is a 81 y.o. male patient admitted from ED:  -No acute distress noted.  -No complaints of shortness of breath.  -No complaints of chest pain.   Cardiac Monitoring: Box # 3E23 in place. Cardiac monitor yields:normal sinus rhythm.  Blood pressure 153/54, pulse 69, temperature 98.7 F (37.1 C), temperature source Oral, resp. rate 18, height  (1.778 m), weight 45.723 kg (100 lb 12.8 oz), SpO2 100 %.   IV Fluids:  IV in place, occlusive dsg intact without redness, IV cath antecubital left, condition patent and no redness none.   Allergies:  Ferrous sulfate  Past Medical History:   has a past medical history of COPD (chronic obstructive pulmonary disease) (HCC); CAD (coronary artery disease); HTN (hypertension); PUD (peptic ulcer disease); Syncope; Carotid artery disease (HCC) (03/22/13); PAD (peripheral artery disease) (HCC); CKD (chronic kidney disease), stage III; HLD (hyperlipidemia); Chronic respiratory failure (HCC); NSVT (nonsustained ventricular tachycardia) (HCC); Stroke [facility]); Shortness of breath; and On supplemental oxygen therapy.  Past Surgical History:   has past surgical history that includes Cataract extraction; Gastrectomy; Transurethral resection of prostate; Coronary angioplasty with stent; Endarterectomy femoral (Bilateral, 11/11/2013); and lower extremity angiogram (N/A, 09/01/2013).  Social History:   reports that he quit smoking about 18 years ago. His smoking use included Cigarettes. He has a 60 pack-year smoking history. He has never used smokeless tobacco. He reports that he does not drink alcohol or use illicit drugs.  Skin: charted on CHL  Patient/Family orientated to room. Information packet given to patient/family. Admission inpatient armband information verified with patient/family to include name and  date of birth and placed on patient arm. Side rails up x 2, fall assessment and education completed with patient/family. Patient/family able to verbalize understanding of risk associated with falls and verbalized understanding to call for assistance before getting out of bed. Call light within reach. Patient/family able to voice and demonstrate understanding of unit orientation instructions.

## 2015-05-21 NOTE — ED Notes (Signed)
Attempted report 

## 2015-05-21 NOTE — Progress Notes (Signed)
Received pt report from Anna,RN-ED. 

## 2015-05-21 NOTE — ED Notes (Signed)
Niu, MD at bedside. °

## 2015-05-21 NOTE — ED Notes (Addendum)
Pt is in CT

## 2015-05-21 NOTE — H&P (Signed)
[facility] History and Physical  Alec Walker EAV:[id number] DOB: 1926-10-24 DOA: 05/21/2015  Referring physician: ED physician PCP: Alec Inch, MD  Specialists:   Chief Complaint: Nausea, vomiting, regurgitation, weight loss  HPI: Alec Walker is a 81 y.o. male with PMH of hypertension, hyperlipidemia, GERD, CAD, S/P stent placement, COPD on 3 L oxygen at home, PUD, PAD, CKD-III, stroke, anemia, diastolic congestive heart failure, s/p of billroth II procedure, who presents with nausea, vomiting, regurgitation weight loss.  He reports that he has been having nausea, vomiting and regurgitation for about one month. He backs up the food that he ate to both liquid and solid food. Patient lost 13 pounds over 4 weeks. He has mild pain over epigastric area, no diarrhea. He had EGD on 1979, which showed normal esophageal less, but with obstruction from Billroth II procedure.  He had sigmoidoscopy on 1982, which showed IBS. He had UGI w/KUB on 05/17/15 which showed no stricture or mass identified to account for the patient's weight loss and regurgitation.  Patient also reports having intermittent mild chest pain. Currently no chest pain per patient. He reports that she has cough due to COPD, sometimes coughs up dark sputum. Currently no worsening SOB. No fever or chills.  In ED, patient was found to have troponin 0.04, WBC 7.6, stable hemoglobin, negative urinalysis, lactate 0.62, temperature normal, no tachycardia, stable renal function. Acute abd/chest showed stable chronic COPD/ emphysema and parenchymal scarring, large volume of retained barium throughout the colon. Negative for obstruction or free air.  Where does patient live?   At home  Can patient participate in ADLs?  Barely   Review of Systems:   General: no fevers, chills, no changes in body weight, has poor appetite, has fatigue HEENT: no blurry vision, hearing changes or sore throat Pulm: has dyspnea, has coughing,  wheezing CV: has chest pain, no palpitations Abd: no nausea, vomiting, abdominal pain, regurgitation, no diarrhea, constipation GU: no dysuria, burning on urination, increased urinary frequency, hematuria  Ext: no leg edema Neuro: no unilateral weakness, numbness, or tingling, no vision change or hearing loss Skin: no rash MSK: No muscle spasm, no deformity, no limitation of range of movement in spin Heme: No easy bruising.  Travel history: No recent long distant travel.  Allergy:  Allergies  Allergen Reactions  . Ferrous Sulfate Nausea And Vomiting    Past Medical History  Diagnosis Date  . COPD (chronic obstructive pulmonary disease) (HCC)   . CAD (coronary artery disease)     a.  nonobstructive CAD by cath 1999. b. Myoview 04/2012: fixed inferior and inferoseptal bowel attenuation artifact, no reversible ischemia. EF 62%. Low risk scan.  Marland Kitchen HTN (hypertension)   . PUD (peptic ulcer disease)     a. s/p surgery 1976.  Marland Kitchen Syncope     2012  . Carotid artery disease (HCC) 03/22/13    a. 50-69% BICA by duplex 03/2013, repeat needed 11/2013.  Marland Kitchen PAD (peripheral artery disease) (HCC)     a. prior stenting of RCIA 1999. b. history of 90% vertebral artery narrowing, 40% subclavian artery narrowing. c. Diagnostic PV angio 08/2013 for progressive claudication - will ultimately need endarterectomy/patch angio on bilat CFA stenosis; will also need atherectomy/PT/stenting to REIA.   . CKD (chronic kidney disease), stage III   . HLD (hyperlipidemia)   . Chronic respiratory failure (HCC)     a. On home O2.  Marland Kitchen NSVT (nonsustained ventricular tachycardia) (HCC)     a. Brief NSVT (6b) during  08/2013 adm.  . Stroke (HCC)   . Shortness of breath   . On supplemental oxygen therapy     2l    Past Surgical History  Procedure Laterality Date  . Cataract extraction    . Gastrectomy    . Transurethral resection of prostate    . Coronary angioplasty with stent placement      stent in 2003; history of  right common iliac artery stent hx  . Endarterectomy femoral Bilateral 11/11/2013    Procedure: BILATERAL FEMORAL ENDARTERECTOMIES WITH BILATERAL PATCH ANGIOPLASTIES;  Surgeon: Alec Libman, MD;  Location: [facility] OR;  Service: Vascular;  Laterality: Bilateral;  . Lower extremity angiogram N/A 09/01/2013    Procedure: LOWER EXTREMITY ANGIOGRAM;  Surgeon: Alec Gess, MD;  Location: [facility] CATH LAB;  Service: Cardiovascular;  Laterality: N/A;    Social History:  reports that he quit smoking about 18 years ago. His smoking use included Cigarettes. He has a 60 pack-year smoking history. He has never used smokeless tobacco. He reports that he does not drink alcohol or use illicit drugs.  Family History:  Family History  Problem Relation Age of Onset  . Hypertension    . Coronary artery disease    . Stroke Mother   . Heart disease Mother   . Hypertension Mother   . Heart attack Mother   . Heart attack Brother   . Heart disease Brother   . Heart disease Father   . Hypertension Father   . Heart attack Father      Prior to Admission medications   Medication Sig Start Date End Date Taking? Authorizing Provider  albuterol (PROAIR HFA) 108 (90 BASE) MCG/ACT inhaler INHALE 2 PUFFS INTO THE LUNGS EVERY 4 HOURS AS NEEDED FOR WHEEZING OR SHORTNESS OF BREATH 04/24/15   Historical Provider, MD  albuterol (PROVENTIL HFA;VENTOLIN HFA) 108 (90 BASE) MCG/ACT inhaler Inhale 2 puffs into the lungs every 4 (four) hours as needed for wheezing or shortness of breath. 06/13/14   Alec Fickle, MD  albuterol (PROVENTIL) (2.5 MG/3ML) 0.083% nebulizer solution INHALE 1 VIAL VIA NEBULIZER EVERY 6 HOURS AS NEEDED FOR WHEEZING 04/23/15   Historical Provider, MD  amLODipine (NORVASC) 5 MG tablet Take 1 tablet (5 mg total) by mouth daily. 01/29/15   Alec Ahmed, MD  aspirin EC 81 MG tablet Take 81 mg by mouth at bedtime.    Historical Provider, MD  benzonatate (TESSALON) 100 MG capsule Take 1 capsule (100 mg total) by mouth  2 (two) times daily. 01/29/15   Alec Ahmed, MD  budesonide-formoterol (SYMBICORT) 160-4.5 MCG/ACT inhaler Inhale 2 puffs into the lungs 2 (two) times daily.     Historical Provider, MD  Cholecalciferol (VITAMIN D3) 2000 UNITS TABS Take 2,000 mg by mouth at bedtime.     Historical Provider, MD  diclofenac (VOLTAREN) 75 MG EC tablet Take 75 mg by mouth 2 (two) times daily.  09/18/14 09/18/15  Historical Provider, MD  feeding supplement, ENSURE ENLIVE, (ENSURE ENLIVE) LIQD Take 237 mLs by mouth daily. Patient not taking: Reported on 05/03/2015 01/29/15   Hyacinth Meeker, MD  ferrous gluconate (FERGON) 324 MG tablet Take 1 tablet (324 mg total) by mouth daily. 01/29/15   Alec Ahmed, MD  metoprolol tartrate (LOPRESSOR) 25 MG tablet Take 25 mg by mouth 2 (two) times daily.     Historical Provider, MD  mirtazapine (REMERON) 30 MG tablet Take 30 mg by mouth at bedtime.    Historical Provider, MD  nitroGLYCERIN (NITROSTAT) 0.4 MG SL  tablet Place 1 tablet (0.4 mg total) under the tongue every 5 (five) minutes as needed for chest pain. 06/13/14   Alec Fickle, MD  ondansetron (ZOFRAN) 4 MG tablet Take 1 tablet (4 mg total) by mouth every 6 (six) hours. 05/03/15   Donnetta Hutching, MD  OXYGEN-HELIUM IN Inhale 3 L into the lungs as needed. 2 liters    Historical Provider, MD  pantoprazole (PROTONIX) 40 MG tablet Take 40 mg by mouth 2 (two) times daily.    Historical Provider, MD  senna-docusate (SENOKOT-S) 8.6-50 MG per tablet Take 1 tablet by mouth at bedtime as needed for mild constipation. Patient not taking: Reported on 05/03/2015 01/29/15   Hyacinth Meeker, MD  simethicone (MYLICON) 80 MG chewable tablet Chew 1 tablet (80 mg total) by mouth every 6 (six) hours as needed for flatulence. 01/29/15   Alec Ahmed, MD  temazepam (RESTORIL) 30 MG capsule Take 30 mg by mouth at bedtime. for sleep 12/15/14   Historical Provider, MD  tiotropium (SPIRIVA HANDIHALER) 18 MCG inhalation capsule Place 1 capsule (18 mcg total) into  inhaler and inhale daily. 06/13/14   Alec Fickle, MD  vitamin B-12 1000 MCG tablet Take 1 tablet (1,000 mcg total) by mouth daily. 01/29/15   Hyacinth Meeker, MD    Physical Exam: Filed Vitals:   05/21/15 2145 05/21/15 2221 05/22/15 0116 05/22/15 0604  BP: 115/55 153/54 118/60 119/59  Pulse: 67 69 63 73  Temp:  98.7 F (37.1 C) 98.5 F (36.9 C) 98.4 F (36.9 C)  TempSrc:  Oral Oral Oral  Resp: Height:   (1.778 m)    Weight:  45.723 kg (100 lb 12.8 oz)  46.72 kg (103 lb)  SpO2: 100% 100% 100% 100%   General: Not in acute distress. Dry mucus and membrane. HEENT:       Eyes: PERRL, EOMI, no scleral icterus.       ENT: No discharge from the ears and nose, no pharynx injection, no tonsillar enlargement.        Neck: No JVD, no bruit, no mass felt. Heme: No neck lymph node enlargement. Cardiac: S1/S2, RRR,  2/6 systolic murmurs, No gallops or rubs. Pulm: Good air movement bilaterally. No rales, wheezing, rhonchi or rubs. Abd: Soft, nondistended, mild tenderness over epigastric area, no rebound pain, no organomegaly, BS present. Ext: No pitting leg edema bilaterally. 2+DP/PT pulse bilaterally. Musculoskeletal: No joint deformities, No joint redness or warmth, no limitation of ROM in spin. Skin: No rashes.  Neuro: Alert, oriented X3, cranial nerves II-XII grossly intact, muscle strength 5/5 in all extremities, sensation to light touch intact. Brachial reflex 2+ bilaterally. Knee reflex 1+ bilaterally. Negative Babinski's sign. Normal finger to nose test. Psych: Patient is not psychotic, no suicidal or hemocidal ideation.  Labs on Admission:  Basic Metabolic Panel:  Recent Labs Lab 05/21/15 1702 05/22/15 0412  NA 139 144  K 3.3* 3.5  CL 109 114*  CO2 25 22  GLUCOSE 99 67  BUN 19 16  CREATININE 1.30* 1.06  CALCIUM 8.0* 7.8*   Liver Function Tests:  Recent Labs Lab 05/21/15 1702  AST 9*  ALT 8*  ALKPHOS 75  BILITOT 0.3  PROT 4.4*  ALBUMIN 1.7*     Recent Labs Lab 05/21/15 2247  LIPASE 22   No results for input(s): AMMONIA in the last 168 hours. CBC:  Recent Labs Lab 05/21/15 1702 05/22/15 0412  WBC 7.6 5.2  NEUTROABS 5.9  --   HGB  8.3* 8.0*  HCT 27.7* 26.1*  MCV 90.5 90.6  PLT 315 263   Cardiac Enzymes:  Recent Labs Lab 05/21/15 1702 05/21/15 2247 05/22/15 0412  TROPONINI 0.04* 0.03 0.04*    BNP (last 3 results)  Recent Labs  01/25/15 1707 05/22/15 0412  BNP 214.1* 286.4*    ProBNP (last 3 results)  Recent Labs  06/10/14 1156  PROBNP 398.9    CBG:  Recent Labs Lab 05/22/15 0603  GLUCAP 58*    Radiological Exams on Admission: Dg Chest Port 1 View  05/21/2015   CLINICAL DATA:  Acute onset of cough.  Initial encounter.  EXAM: PORTABLE CHEST 1 VIEW  COMPARISON:  Chest radiograph performed earlier today at 7:37 p.m.  FINDINGS: Increased interstitial markings appear to be chronic in nature, with underlying scarring and emphysematous change. No pleural effusion or pneumothorax is seen.  The cardiomediastinal silhouette is normal in size. No acute osseous abnormalities are identified.  IMPRESSION: Chronic lung changes noted, with scarring and emphysematous change. No definite acute airspace consolidation seen.   Electronically Signed   By: Roanna Raider M.D.   On: 05/21/2015 22:49   Dg Abd Acute W/chest  05/21/2015   CLINICAL DATA:  Nausea and vomiting and dizziness.  EXAM: DG ABDOMEN ACUTE W/ 1V CHEST  COMPARISON:  05/17/2015, 05/03/2015  FINDINGS: Hyperinflation evident with mild interstitial prominence, suspect background COPD/emphysema. Normal heart size and vascularity. Bibasilar atelectasis versus scarring as before. No new superimposed pneumonia, collapse or consolidation. No effusion or pneumothorax. No significant edema pattern. Stable mild left hilar prominence, suspect vascular markings. Atherosclerosis of the aorta. Bones are osteopenic. Degenerative changes of the spine.  Large amount  retained barium throughout the entire colon. No obstruction pattern. No significant ileus. Aortoiliac extensive atherosclerosis. No free air on the decubitus view.  IMPRESSION: Stable chronic COPD/ emphysema and parenchymal scarring.  Large volume of retained barium throughout the colon. Negative for obstruction or free air.  Aortoiliac atherosclerosis  Osteopenia and degenerative changes of the spine.   Electronically Signed   By: Judie Petit.  Shick M.D.   On: 05/21/2015 20:03    EKG: Independently reviewed.  Abnormal findings: PAC, T-wave inversion in inferior leads and V4-V6 which is similar to previous EKG on 05/03/15.  Assessment/Plan Principal Problem:   Nausea & vomiting Active Problems:   COPD (chronic obstructive pulmonary disease) (HCC)   CAD (coronary artery disease)   Elevated troponin   HTN (hypertension)   Weakness generalized   PVD (peripheral vascular disease) (HCC)   Hyperlipidemia with target LDL less than 70   Carotid artery disease (HCC)   CKD (chronic kidney disease), stage III   Chest pain   Protein-calorie malnutrition, severe (HCC)   Failure to thrive in adult   Nausea, vomiting and regurgitation: Etiology is not clear. He had UGI w/KUB on 05/17/15 which showed no stricture or mass identified to account for the patient's weight loss and regurgitation. He may have esophageal dysmotility functionally. Pt's blood sugar is running low. -will admit to tele bed -Zofran for nausea, morphine for pain -try IV reglan -please consult to GI in AM -D5-NS at 75 cc/h -cbg q1h  Abdominal pain: Patient has mild abdominal pain over epigastric area. Etiology is not clear. Lipase 22. Liver function okay. Did not order CT-abd/pelvis since he still has large volume of retained barium throughout the colon. -may get CT-abd/pelvis after he clears up barium in colon -When necessary morphine for pain  COPD (chronic obstructive pulmonary disease): No acute exacerbations. -When  necessary  albuterol nebs -Symbicort inhaler, spirava inhaler  CAD and elevate trop: trop 0.04. Pt is s/p stent. - cycle CE q6 x3 and repeat her EKG in the am  - Nitroglycerin, Morphine, and aspirin, lipitor  - Risk factor stratification: will check FLP and A1C  - 2d echo  HTN: -Metoprolol  HLD: Last LDL was 65 on 06/17/13 -Started Lipitor -Check FLP  Carotid artery disease: -ASA  CKD (chronic kidney disease), stage III: Stable. Baseline creatinine 1.25-1.30, his creatinine is 1.30, which is at baseline.  - follow-up renal function by BMP  Protein-calorie malnutrition, severe; -consult to nutrition  DVT ppx: SQ Heparin     Code Status: DNR Family Communication:  Yes, patient's daughter,POA at bed side Disposition Plan: Admit to inpatient   Date of Service 05/22/2015    Lorretta Harp [facility] Pager [phone]  If 7PM-7AM, please contact night-coverage [email or web address] Password [email or web address] 05/22/2015, 6:18 AM

## 2015-05-22 ENCOUNTER — Inpatient Hospital Stay (HOSPITAL_COMMUNITY): Payer: Medicare Other

## 2015-05-22 DIAGNOSIS — R1110 Vomiting, unspecified: Secondary | ICD-10-CM

## 2015-05-22 DIAGNOSIS — IMO0001 Reserved for inherently not codable concepts without codable children: Secondary | ICD-10-CM | POA: Diagnosis present

## 2015-05-22 DIAGNOSIS — J9611 Chronic respiratory failure with hypoxia: Secondary | ICD-10-CM

## 2015-05-22 DIAGNOSIS — J438 Other emphysema: Secondary | ICD-10-CM

## 2015-05-22 DIAGNOSIS — R079 Chest pain, unspecified: Secondary | ICD-10-CM

## 2015-05-22 DIAGNOSIS — R7989 Other specified abnormal findings of blood chemistry: Secondary | ICD-10-CM

## 2015-05-22 LAB — LIPID PANEL
CHOL/HDL RATIO: 3.3 ratio
CHOLESTEROL: 106 mg/dL (ref 0–200)
HDL: 32 mg/dL — AB (ref >40)
LDL Cholesterol: 57 mg/dL (ref 0–99)
TRIGLYCERIDES: 84 mg/dL (ref <150)
VLDL: 17 mg/dL (ref 0–40)

## 2015-05-22 LAB — GLUCOSE, CAPILLARY
GLUCOSE-CAPILLARY: 85 mg/dL (ref 65–99)
GLUCOSE-CAPILLARY: 94 mg/dL (ref 65–99)
Glucose-Capillary: 58 mg/dL — ABNORMAL LOW (ref 65–99)

## 2015-05-22 LAB — CBC
HEMATOCRIT: 26.1 % — AB (ref 39.0–52.0)
HEMOGLOBIN: 8 g/dL — AB (ref 13.0–17.0)
MCH: 27.8 pg (ref 26.0–34.0)
MCHC: 30.7 g/dL (ref 30.0–36.0)
MCV: 90.6 fL (ref 78.0–100.0)
Platelets: 263 10*3/uL (ref 150–400)
RBC: 2.88 MIL/uL — AB (ref 4.22–5.81)
RDW: 15 % (ref 11.5–15.5)
WBC: 5.2 10*3/uL (ref 4.0–10.5)

## 2015-05-22 LAB — BASIC METABOLIC PANEL
ANION GAP: 8 (ref 5–15)
BUN: 16 mg/dL (ref 6–20)
CALCIUM: 7.8 mg/dL — AB (ref 8.9–10.3)
CO2: 22 mmol/L (ref 22–32)
Chloride: 114 mmol/L — ABNORMAL HIGH (ref 101–111)
Creatinine, Ser: 1.06 mg/dL (ref 0.61–1.24)
GLUCOSE: 67 mg/dL (ref 65–99)
POTASSIUM: 3.5 mmol/L (ref 3.5–5.1)
SODIUM: 144 mmol/L (ref 135–145)

## 2015-05-22 LAB — TROPONIN I
Troponin I: 0.04 ng/mL — ABNORMAL HIGH (ref <0.031)
Troponin I: 0.04 ng/mL — ABNORMAL HIGH (ref <0.031)

## 2015-05-22 LAB — BRAIN NATRIURETIC PEPTIDE: B NATRIURETIC PEPTIDE 5: 286.4 pg/mL — AB (ref 0.0–100.0)

## 2015-05-22 LAB — MRSA PCR SCREENING: MRSA BY PCR: POSITIVE — AB

## 2015-05-22 MED ORDER — SIMETHICONE 80 MG PO CHEW
160.0000 mg | CHEWABLE_TABLET | Freq: Four times a day (QID) | ORAL | Status: DC | PRN
  Administered 2015-05-22: 160 mg via ORAL
  Filled 2015-05-22: qty 2

## 2015-05-22 MED ORDER — DEXTROSE-NACL 5-0.9 % IV SOLN
INTRAVENOUS | Status: DC
  Administered 2015-05-22: 09:00:00 via INTRAVENOUS

## 2015-05-22 MED ORDER — METOCLOPRAMIDE HCL 5 MG/ML IJ SOLN
10.0000 mg | Freq: Two times a day (BID) | INTRAMUSCULAR | Status: DC
  Administered 2015-05-22 – 2015-05-23 (×3): 10 mg via INTRAVENOUS
  Filled 2015-05-22 (×3): qty 2

## 2015-05-22 MED ORDER — BOOST / RESOURCE BREEZE PO LIQD
1.0000 | Freq: Two times a day (BID) | ORAL | Status: DC
  Administered 2015-05-24 – 2015-05-25 (×2): 1 via ORAL

## 2015-05-22 MED ORDER — SODIUM CHLORIDE 0.9 % IV SOLN: INTRAVENOUS | Status: DC

## 2015-05-22 MED ORDER — CHLORHEXIDINE GLUCONATE CLOTH 2 % EX PADS
6.0000 | MEDICATED_PAD | Freq: Every day | CUTANEOUS | Status: DC
  Administered 2015-05-22 – 2015-05-25 (×4): 6 via TOPICAL

## 2015-05-22 MED ORDER — POTASSIUM CHLORIDE 2 MEQ/ML IV SOLN
INTRAVENOUS | Status: DC
  Administered 2015-05-22 – 2015-05-24 (×3): via INTRAVENOUS
  Filled 2015-05-22 (×5): qty 1000

## 2015-05-22 MED ORDER — MUPIROCIN 2 % EX OINT
1.0000 "application " | TOPICAL_OINTMENT | Freq: Two times a day (BID) | CUTANEOUS | Status: DC
  Administered 2015-05-22 – 2015-05-25 (×8): 1 via NASAL
  Filled 2015-05-22 (×2): qty 22

## 2015-05-22 NOTE — Progress Notes (Signed)
PROGRESS NOTE  Alec Walker ZOX:[id number] DOB: [birth year] DOA: 05/21/2015 PCP: Eartha Inch, MD  Brief history 81 year old male with a history of hypertension, hyperlipidemia, chronic respiratory failure on 3 L at home secondary to COPD, CAD, CKD stage III, and peptic ulcer disease resulting in a Billroth II bypass presents with 4 day history of intractable nausea and vomiting. The patient states that he has had recurrent vomiting on and off for the past 6 months and has lost approximately 13 pounds in the past month. He complains of epigastric and periumbilical pain. Notably, the patient had an upper GI study performed on 05/17/2015 which was negative for any mass or stricture. Notably, the patient has been taking diclofenac twice a day for arthritis for many months. He denies any other NSAID use. He states that he had a remote history of a Billroth II bypass secondary to peptic ulcer disease. He denies any hematemesis or coffee grounds emesis. Fecal occult blood test was negative in the emergency department. Assessment/Plan: Intractable nausea and vomiting  -Suspect this may be due to peptic ulcer disease in a patient with chronic NSAID use and history of peptic ulcer disease with Billroth II bypass -Discontinue diclofenac  -Consulted [facility] for possible EGD  -Continue PPI  -Continue intravenous fluids  -05/17/2015 upper GI study negative for stricture or mass Epigastric/periumbilical pain -Cannot obtain CT abdomen and pelvis due to retained contrast from his upper GI study -Exam is benign -Clear liquid diet for now -lipase 22; LFTs normal CKD stage III -Baseline creatinine 1.2-1.5 -Continue intravenous fluids COPD/chronic respiratory failure with hypoxia -Continue Spiriva and Symbicort -Patient is maintained on 3 L nasal cannula at home -Stable presently without any increased work of breathing Elevated troponins -No symptoms of angina -EKG unchanged from previous  comparisons -Likely demand ischemia in the setting of volume depletion and CKD Severe protein calorie malnutrition -Continue nutritional supplementation History of stroke -Continue aspirin and statin for secondary prophylaxis Hypertension -Continue metoprolol tartrate 25 mg twice a day   Family Communication:   Pt at beside Disposition Plan:   Home when medically stable       Procedures/Studies: Dg Chest 2 View  05/04/2015   CLINICAL DATA:  Patient complains of nausea vomiting and weakness.  EXAM: CHEST  2 VIEW  COMPARISON:  01/25/2015  FINDINGS: Heart size is normal. No pleural effusion or edema. Aortic atherosclerosis. Marked coarsened interstitial markings are noted throughout both lungs compatible with chronic lung disease. Scar noted in the left base.  IMPRESSION: No acute cardiopulmonary abnormalities.  Diffuse chronic interstitial coarsening noted.  Aortic atherosclerosis and left base scarring.   Electronically Signed   By: Signa Kell M.D.   On: 05/04/2015 08:20   Dg Chest Port 1 View  05/21/2015   CLINICAL DATA:  Acute onset of cough.  Initial encounter.  EXAM: PORTABLE CHEST 1 VIEW  COMPARISON:  Chest radiograph performed earlier today at 7:37 p.m.  FINDINGS: Increased interstitial markings appear to be chronic in nature, with underlying scarring and emphysematous change. No pleural effusion or pneumothorax is seen.  The cardiomediastinal silhouette is normal in size. No acute osseous abnormalities are identified.  IMPRESSION: Chronic lung changes noted, with scarring and emphysematous change. No definite acute airspace consolidation seen.   Electronically Signed   By: Roanna Raider M.D.   On: 05/21/2015 22:49   Dg Abd Acute W/chest  05/21/2015   CLINICAL DATA:  Nausea and vomiting and dizziness.  EXAM: DG ABDOMEN ACUTE W/ 1V CHEST  COMPARISON:  05/17/2015, 05/03/2015  FINDINGS: Hyperinflation evident with mild interstitial prominence, suspect background COPD/emphysema.  Normal heart size and vascularity. Bibasilar atelectasis versus scarring as before. No new superimposed pneumonia, collapse or consolidation. No effusion or pneumothorax. No significant edema pattern. Stable mild left hilar prominence, suspect vascular markings. Atherosclerosis of the aorta. Bones are osteopenic. Degenerative changes of the spine.  Large amount retained barium throughout the entire colon. No obstruction pattern. No significant ileus. Aortoiliac extensive atherosclerosis. No free air on the decubitus view.  IMPRESSION: Stable chronic COPD/ emphysema and parenchymal scarring.  Large volume of retained barium throughout the colon. Negative for obstruction or free air.  Aortoiliac atherosclerosis  Osteopenia and degenerative changes of the spine.   Electronically Signed   By: Judie Petit.  Shick M.D.   On: 05/21/2015 20:03   Dg Ugi  W/kub  05/17/2015   CLINICAL DATA:  40 lb weight loss. Regurgitation of food. Severe gastroesophageal reflux.  EXAM: UPPER GI SERIES WITH KUB  TECHNIQUE: After obtaining a scout radiograph a routine upper GI series was performed using thick and thin barium  FLUOROSCOPY TIME:  Radiation Exposure Index (as provided by the fluoroscopic device):  If the device does not provide the exposure index:  Fluoroscopy Time (in minutes and seconds):  2 minutes  Number of Acquired Images:  12  COMPARISON:  09/25/2014  FINDINGS: The esophagus has a normal morphology. No evidence for esophagitis, ulceration, stricture or mass. No evidence for hiatal hernia. The patient ingested a 13 mm barium tablet which promptly passed through the esophagus and into the stomach.  Evidence of previous gastrectomy with the gastroenteric anastomosis. No evidence for gastric ulceration or stricture. Gastric motility and emptying was unremarkable. No small bowel dilatation.  IMPRESSION: 1. No stricture or mass identified to account for the patient's weight loss and regurgitation.   Electronically Signed   By: Signa Kell M.D.   On: 05/17/2015 13:25         Subjective: Patient denies fevers, chills, headache, chest pain, dyspnea, nausea, vomiting, diarrhea, abdominal pain, dysuria, hematuria; denies hematochezia, melena, hematemesis, headache, rashes    Objective: Filed Vitals:   05/22/15 0604 05/22/15 0730 05/22/15 0850 05/22/15 1012  BP: 119/59 133/51  108/51  Pulse: 73 60  66  Temp: 98.4 F (36.9 C) 98 F (36.7 C)    TempSrc: Oral Oral    Resp: 18 19    Height:      Weight: 46.72 kg (103 lb)     SpO2: 100% 100% 100%     Intake/Output Summary (Last 24 hours) at 05/22/15 1122 Last data filed at 05/22/15 0900  Gross per 24 hour  Intake 1116.25 ml  Output    250 ml  Net 866.25 ml   Weight change:  Exam:   General:  Pt is alert, follows commands appropriately, not in acute distress  HEENT: No icterus, No thrush, No neck mass, [place]/AT  Cardiovascular: RRR, S1/S2, no rubs, no gallops  Respiratory: bilateral scattered rales. Diminished breath sounds bilateral. No wheezing.   Abdomen: Soft/+BS, non tender, non distended, no guarding; no hepatosplenomegaly   Extremities: No edema, No lymphangitis, No petechiae, No rashes, no synovitis; + clubbing without cyanosis  Data Reviewed: Basic Metabolic Panel:  Recent Labs Lab 05/21/15 1702 05/22/15 0412  NA 139 144  K 3.3* 3.5  CL 109 114*  CO2 25 22  GLUCOSE 99 67  BUN 19 16  CREATININE 1.30* 1.06  CALCIUM  8.0* 7.8*   Liver Function Tests:  Recent Labs Lab 05/21/15 1702  AST 9*  ALT 8*  ALKPHOS 75  BILITOT 0.3  PROT 4.4*  ALBUMIN 1.7*    Recent Labs Lab 05/21/15 2247  LIPASE 22   No results for input(s): AMMONIA in the last 168 hours. CBC:  Recent Labs Lab 05/21/15 1702 05/22/15 0412  WBC 7.6 5.2  NEUTROABS 5.9  --   HGB 8.3* 8.0*  HCT 27.7* 26.1*  MCV 90.5 90.6  PLT 315 263   Cardiac Enzymes:  Recent Labs Lab 05/21/15 1702 05/21/15 2247 05/22/15 0412  TROPONINI 0.04* 0.03 0.04*    BNP: Invalid input(s): POCBNP CBG:  Recent Labs Lab 05/22/15 0603 05/22/15 0641 05/22/15 0845  GLUCAP 58* 85 94    Recent Results (from the past 240 hour(s))  MRSA PCR Screening     Status: Abnormal   Collection Time: 05/21/15 10:16 PM  Result Value Ref Range Status   MRSA by PCR POSITIVE (A) NEGATIVE Final    Comment:        The GeneXpert MRSA Assay (FDA approved for NASAL specimens only), is one component of a comprehensive MRSA colonization surveillance program. It is not intended to diagnose MRSA infection nor to guide or monitor treatment for MRSA infections. RESULT CALLED TO, READ BACK BY AND VERIFIED WITH: [email or web address]  05/21/15 MKELLY      Scheduled Meds: . aspirin EC  81 mg Oral QHS  . atorvastatin  40 mg Oral q1800  . budesonide-formoterol  2 puff Inhalation BID  . Chlorhexidine Gluconate Cloth  6 each Topical Q0600  . cholecalciferol  2,000 Units Oral QHS  . guaiFENesin  600 mg Oral BID  . heparin  5,000 Units Subcutaneous 3 times per day  . metoCLOPramide (REGLAN) injection  10 mg Intravenous Q12H  . metoprolol tartrate  25 mg Oral BID  . mirtazapine  30 mg Oral QHS  . mupirocin ointment  1 application Nasal BID  . ondansetron (ZOFRAN) IV  4 mg Intravenous Once  . pantoprazole (PROTONIX) IV  40 mg Intravenous Q24H  . temazepam  30 mg Oral QHS  . tiotropium  18 mcg Inhalation Daily  . vitamin B-12  1,000 mcg Oral Daily   Continuous Infusions: . dextrose 5 % and 0.9% NaCl 75 mL/hr at 05/22/15 0839     Jayr Lupercio, DO  [facility] Pager [phone]  If 7PM-7AM, please contact night-coverage [email or web address] Password [email or web address] 05/22/2015, 11:22 AM   LOS: 1 day

## 2015-05-22 NOTE — Progress Notes (Signed)
Hypoglycemic Event  CBG: 58  Treatment: 15 GM carbohydrate snack  Symptoms: Pale and Hungry  Follow-up CBG: Time: 0641 CBG Result: 85  Possible Reasons for Event: Inadequate meal intake  Comments/MD notified: Niu,MD    Alec Walker  Remember to initiate Hypoglycemia Order Set & complete

## 2015-05-22 NOTE — Evaluation (Signed)
Occupational Therapy Evaluation Patient Details Name: Alec Walker MRN: [id number] DOB: 08-Jan-1927 Today's Date: 05/22/2015    History of Present Illness 81 y.o. male with PMH of hypertension, hyperlipidemia, GERD, CAD, S/P stent placement, COPD on 3 L oxygen at home, PUD, PAD, CKD-III, stroke, anemia, diastolic congestive heart failure, s/p of billroth II procedure, who presents with nausea, vomiting, regurgitation weight loss   Clinical Impression   Pt with recent reports being able to care for himself, prepare meals and perform light housekeeping for he and his wife prior to admission.  ADL and IADL have been effortful and pt has had multiple falls.  Pt presents with generalized weakness, impaired balance and poor activity tolerance.  Will follow acutely.  Recommending ST rehab, HHOT if pt declines SNF.    Follow Up Recommendations  SNF    Equipment Recommendations       Recommendations for Other Services       Precautions / Restrictions Precautions Precautions: Fall (pt with recent multiple falls) Restrictions Weight Bearing Restrictions: No      Mobility Bed Mobility   Bed Mobility: Supine to Sit;Sit to Supine     Supine to sit: Supervision;HOB elevated     General bed mobility comments: no physical assist, used rail, required extra time and effort  Transfers Overall transfer level: Needs assistance Equipment used: 1 person hand held assist Transfers: Sit to/from Stand Sit to Stand: Min guard              Balance     Sitting balance-Leahy Scale: Good       Standing balance-Leahy Scale: Fair                              ADL Overall ADL's : Needs assistance/impaired Eating/Feeding: NPO   Grooming: Wash/dry hands;Sitting;Set up   Upper Body Bathing: Sitting;Supervision/ safety   Lower Body Bathing: Minimal assistance;Sit to/from stand   Upper Body Dressing : Set up;Sitting   Lower Body Dressing: Minimal assistance;Sit  to/from stand   Toilet Transfer: Minimal assistance;Ambulation;BSC   Toileting- Clothing Manipulation and Hygiene: Minimal assistance;Sit to/from stand       Functional mobility during ADLs: Minimal assistance (hand held) General ADL Comments: Pt with decreased activity tolerance.     Vision     Perception     Praxis      Pertinent Vitals/Pain Pain Assessment: No/denies pain     Hand Dominance Right   Extremity/Trunk Assessment Upper Extremity Assessment Upper Extremity Assessment: Generalized weakness   Lower Extremity Assessment Lower Extremity Assessment: Generalized weakness       Communication Communication Communication: HOH   Cognition Arousal/Alertness: Awake/alert Behavior During Therapy: Flat affect Overall Cognitive Status: No family/caregiver present to determine baseline cognitive functioning       Memory: Decreased short-term memory             General Comments       Exercises       Shoulder Instructions      Home Living Family/patient expects to be discharged to:: Private residence Living Arrangements: Spouse/significant other Available Help at Discharge: Family;Available PRN/intermittently Type of Home: House Home Access: Stairs to enter [employer] of Steps: 5 Entrance Stairs-Rails: Right Home Layout: One level     Bathroom Shower/Tub: Walk-in shower;Door   [employer] Toilet: Standard     Home Equipment: Environmental consultant - 2 wheels;Cane - single point   Additional Comments: pt reports he drives, does the  cooking and cares for himself. He has Rw but doesn't normally use it. Has had 5 falls in the last year      Prior Functioning/Environment Level of Independence: Independent             OT Diagnosis: Generalized weakness;Cognitive deficits   OT Problem List: Decreased strength;Decreased activity tolerance;Impaired balance (sitting and/or standing);Decreased knowledge of use of DME or AE   OT  Treatment/Interventions: Self-care/ADL training;DME and/or AE instruction;Patient/family education;Balance training    OT Goals(Current goals can be found in the care plan section) Acute Rehab OT Goals Patient Stated Goal: be able to get stronger and care for myself OT Goal Formulation: With patient Time For Goal Achievement: 06/05/15 Potential to Achieve Goals: Good ADL Goals Pt Will Perform Grooming: with supervision;standing (3 activities) Pt Will Perform Lower Body Bathing: with supervision;sit to/from stand Pt Will Perform Lower Body Dressing: with supervision;sit to/from stand Pt Will Transfer to Toilet: with supervision;ambulating;regular height toilet Pt Will Perform Toileting - Clothing Manipulation and hygiene: with supervision;sit to/from stand Pt Will Perform Tub/Shower Transfer: with supervision;ambulating;shower seat;rolling walker Pt/caregiver will Perform Home Exercise Program: Increased strength;Both right and left upper extremity;With theraband;Independently Additional ADL Goal #1: Pt will utilize energy conservation strategies during ADL and mobility with minimal verbal cues.  OT Frequency: Min 2X/week   Barriers to D/C: Decreased caregiver support          Co-evaluation              End of Session Equipment Utilized During Treatment: Gait belt;Oxygen  Activity Tolerance: Patient limited by fatigue Patient left: in bed;with call bell/phone within reach;with bed alarm set   Time: 9147-8295 OT Time Calculation (min): 14 min Charges:  OT General Charges $OT Visit: 1 Procedure OT Evaluation $Initial OT Evaluation Tier I: 1 Procedure G-Codes:    Evern Bio 05/22/2015, 3:32 PM  [phone]

## 2015-05-22 NOTE — Evaluation (Signed)
Physical Therapy Evaluation Patient Details Name: Alec Walker MRN: [id number] DOB: 1927-01-23 Today's Date: 05/22/2015   History of Present Illness  81 y.o. male with PMH of hypertension, hyperlipidemia, GERD, CAD, S/P stent placement, COPD on 3 L oxygen at home, PUD, PAD, CKD-III, stroke, anemia, diastolic congestive heart failure, s/p of billroth II procedure, who presents with nausea, vomiting, regurgitation weight loss  Clinical Impression  Pt HOH but pleasant. He states he has periodic dizziness for several months but not related to positions and feeling as if he might pass out, no symptoms during mobility or gait today. Pt with multiple falls, need for assist with all mobility and wife unable to provide assist. Pt will benefit from acute therapy to maximize mobility, gait, balance and function to decrease burden of care and fall risk. Pt will benefit from ST-SNF to maximize function prior to return home. Should pt decline SNF would maximize [facility] services with therapy.     Follow Up Recommendations SNF;Supervision for mobility/OOB    Equipment Recommendations  None recommended by PT    Recommendations for Other Services OT consult     Precautions / Restrictions Precautions Precautions: Fall Restrictions Weight Bearing Restrictions: No      Mobility  Bed Mobility Overal bed mobility: Needs Assistance Bed Mobility: Supine to Sit     Supine to sit: Min assist     General bed mobility comments: cues for sequence with assist to fully elevate trunk from surface  Transfers Overall transfer level: Needs assistance   Transfers: Sit to/from Stand Sit to Stand: Min guard         General transfer comment: cues for hand placement  Ambulation/Gait Ambulation/Gait assistance: Min assist Ambulation Distance (Feet): 120 Feet Assistive device: Rolling walker (2 wheeled) Gait Pattern/deviations: Step-through pattern;Decreased stride length   Gait velocity interpretation:  Below normal speed for age/gender General Gait Details: cues for posture, position in RW and assist to direct RW as pt running into obstacles x 3  Stairs            Wheelchair Mobility    Modified Rankin (Stroke Patients Only)       Balance Overall balance assessment: Needs assistance   Sitting balance-Leahy Scale: Good       Standing balance-Leahy Scale: Fair                               Pertinent Vitals/Pain Pain Assessment: No/denies pain    Home Living Family/patient expects to be discharged to:: Private residence Living Arrangements: Spouse/significant other Available Help at Discharge: Family;Available PRN/intermittently Type of Home: House Home Access: Stairs to enter Entrance Stairs-Rails: Right Entrance Stairs-Number of Steps: 5 Home Layout: One level Home Equipment: Walker - 2 wheels;Cane - single point Additional Comments: pt reports he drives, does the cooking and cares for himself. He has Rw but doesn't normally use it. Has had 5 falls in the last year    Prior Function Level of Independence: Independent               Hand Dominance        Extremity/Trunk Assessment   Upper Extremity Assessment: Generalized weakness           Lower Extremity Assessment: Generalized weakness;RLE deficits/detail;LLE deficits/detail RLE Deficits / Details: hip flexion 4/5, knee extension 5/5, knee flexion 4/5 LLE Deficits / Details: hip flexion 4/5, knee extension 5/5, knee flexion 4/5     Communication  Communication: HOH  Cognition Arousal/Alertness: Awake/alert Behavior During Therapy: Flat affect Overall Cognitive Status: Within Functional Limits for tasks assessed                      General Comments      Exercises General Exercises - Lower Extremity Heel Slides: AROM;Seated;Both;10 reps Hip Flexion/Marching: AROM;Seated;Both;10 reps      Assessment/Plan    PT Assessment Patient needs continued PT services   PT Diagnosis Difficulty walking;Generalized weakness   PT Problem List Decreased strength;Decreased activity tolerance;Decreased balance;Decreased mobility;Decreased knowledge of use of DME  PT Treatment Interventions Gait training;DME instruction;Stair training;Functional mobility training;Therapeutic activities;Therapeutic exercise;Balance training;Patient/family education   PT Goals (Current goals can be found in the Care Plan section) Acute Rehab PT Goals Patient Stated Goal: be able to get stronger and care for myself PT Goal Formulation: With patient Time For Goal Achievement: 06/05/15 Potential to Achieve Goals: Good    Frequency Min 3X/week   Barriers to discharge Decreased caregiver support      Co-evaluation               End of Session Equipment Utilized During Treatment: Gait belt;Oxygen Activity Tolerance: Patient tolerated treatment well Patient left: in chair;with call bell/phone within reach;with chair alarm set Nurse Communication: Mobility status;Precautions         Time: 1610-9604 PT Time Calculation (min) (ACUTE ONLY): 23 min   Charges:   PT Evaluation $Initial PT Evaluation Tier I: 1 Procedure PT Treatments $Gait Training: 8-22 mins   PT G CodesDelorse Lek 05/22/2015, 11:19 AM Delaney Meigs, PT [phone]

## 2015-05-22 NOTE — Progress Notes (Signed)
Initial Nutrition Assessment  DOCUMENTATION CODES:   Severe malnutrition in context of chronic illness, Underweight  INTERVENTION:  Boost Breeze po BID, each supplement provides 250 kcal and 9 grams of protein Provide Magic Cup ice cream BID with lunch and dinner, each supplement provides 290 kcal and 9 grams of protein   NUTRITION DIAGNOSIS:   Malnutrition related to poor appetite, nausea, vomiting as evidenced by severe depletion of body fat, severe depletion of muscle mass, percent weight loss.   GOAL:   Patient will meet greater than or equal to 90% of their needs   MONITOR:   PO intake, Supplement acceptance, Labs, Weight trends, Skin  REASON FOR ASSESSMENT:   Consult, Malnutrition Screening Tool Assessment of nutrition requirement/status  ASSESSMENT:   81 y.o. male with PMH of hypertension, hyperlipidemia, GERD, CAD, S/P stent placement, COPD on 3 L oxygen at home, PUD, PAD, CKD-III, stroke, anemia, diastolic congestive heart failure, s/p of billroth II procedure, who presents with nausea, vomiting, regurgitation weight loss.He reports that he has been having nausea, vomiting and regurgitation for about one month. Patient lost 13 pounds over 4 weeks.  Pt states that he eats 3 meals daily but, has a poor appetite and eats about 50% less than he did a couple years ago. He reports tolerating breakfast and lunch but, vomiting after dinner. He tolerates chicken, beef, potatoes, and soup best. Weight history shows pt has lost 10% of his body weight within the past 6 months. He has severe muscle and fat wasting per physical exam. Pt agreeable to trying Boost Breeze and Magic Cup nutritional supplements while admitted.   Labs: low calcium, low hemoglobin  Diet Order:  DIET DYS 2 Room service appropriate?: Yes; Fluid consistency:: Thin  Skin:  Reviewed, no issues  Last BM:  10/2  Height:   Ht Readings from Last 1 Encounters:  05/21/15  (1.778 m)    Weight:   Wt  Readings from Last 1 Encounters:  05/22/15 103 lb (46.72 kg)    Ideal Body Weight:  75.45 kg  BMI:  Body mass index is 14.78 kg/(m^2).  Estimated Nutritional Needs:   Kcal:  1450-1650  Protein:  70-80 grams  Fluid:  1.4-1.6 L/day  EDUCATION NEEDS:   No education needs identified at this time  Dorothea Ogle RD, LDN Inpatient Clinical Dietitian Pager: [phone] After Hours Pager: [phone]

## 2015-05-22 NOTE — Progress Notes (Signed)
[facility]  Patient Status: Active (receiving services up to time of hospitalization)  [facility] is providing the following services: RN and PT  If patient discharges after hours, please call [phone].   Alec Walker 05/22/2015, 10:19 AM

## 2015-05-22 NOTE — Progress Notes (Signed)
*  PRELIMINARY RESULTS* Echocardiogram 2D Echocardiogram has been performed.  Alec Walker 05/22/2015, 9:45 AM

## 2015-05-22 NOTE — Progress Notes (Signed)
Pt a/o, no c/o pain, pt HOH, pt on IVF NS with 20 mEq of K @ 57ml/hr, VSS, pt stable

## 2015-05-22 NOTE — Consult Note (Signed)
[facility] CONSULT Reason for consult: nausea and vomiting Referring Physician: [facility]. PCP Dr. Melford Aase. Primary G.I.: unassigned  Alec Walker is an 81 y.o. male.  HPI: he has chronic COPD and is on 3 L of oxygen at home. He has CKD stage III in history of heart failure. He has had previous ulcer disease and approximately 30 years ago had an ulcer operation that is described in the records as a partial gastrectomy with billroth 2 anastomosis he has had one week or more of nausea vomiting and weight loss but admits it may be longer. Over the past several weeks he's lost over 10 pounds. He has had previous upper G.I. series revealed normal esophagus and no clear ulceration or obstruction. Patient is continued to have nausea and vomiting. In EGD is requested. His home medication list indicates the use of diclofenac as well as steroids. He has been on Protonix at home.  Past Medical History  Diagnosis Date  . COPD (chronic obstructive pulmonary disease) ([place])   . CAD (coronary artery disease)     a.  nonobstructive CAD by cath 1999. b. Myoview 04/2012: fixed inferior and inferoseptal bowel attenuation artifact, no reversible ischemia. EF 62%. Low risk scan.  Marland Kitchen HTN (hypertension)   . PUD (peptic ulcer disease)     a. s/p surgery 1976.  Marland Kitchen Syncope     2012  . Carotid artery disease ([place]) 03/22/13    a. 50-69% BICA by duplex 03/2013, repeat needed 11/2013.  Marland Kitchen PAD (peripheral artery disease) ([place])     a. prior stenting of RCIA 1999. b. history of 90% vertebral artery narrowing, 40% subclavian artery narrowing. c. Diagnostic PV angio 08/2013 for progressive claudication - will ultimately need endarterectomy/patch angio on bilat CFA stenosis; will also need atherectomy/PT/stenting to REIA.   . CKD (chronic kidney disease), stage III   . HLD (hyperlipidemia)   . Chronic respiratory failure ([place])     a. On home O2.  Marland Kitchen NSVT (nonsustained ventricular tachycardia) ([place])     a. Brief  NSVT (6b) during 08/2013 adm.  . Stroke ([place])   . Shortness of breath   . On supplemental oxygen therapy     2l    Past Surgical History  Procedure Laterality Date  . Cataract extraction    . Gastrectomy    . Transurethral resection of prostate    . Coronary angioplasty with stent placement      stent in 2003; history of right common iliac artery stent hx  . Endarterectomy femoral Bilateral 11/11/2013    Procedure: BILATERAL FEMORAL ENDARTERECTOMIES WITH BILATERAL PATCH ANGIOPLASTIES;  Surgeon: Serafina Mitchell, MD;  Location: [facility];  Service: Vascular;  Laterality: Bilateral;  . Lower extremity angiogram N/A 09/01/2013    Procedure: LOWER EXTREMITY ANGIOGRAM;  Surgeon: Lorretta Harp, MD;  Location: [facility] CATH LAB;  Service: Cardiovascular;  Laterality: N/A;    Family History  Problem Relation Age of Onset  . Hypertension    . Coronary artery disease    . Stroke Mother   . Heart disease Mother   . Hypertension Mother   . Heart attack Mother   . Heart attack Brother   . Heart disease Brother   . Heart disease Father   . Hypertension Father   . Heart attack Father     Social History:  reports that he quit smoking about 18 years ago. His smoking use included Cigarettes. He has a 60 pack-year smoking history. He has never used smokeless  tobacco. He reports that he does not drink alcohol or use illicit drugs.  Allergies:  Allergies  Allergen Reactions  . Ferrous Sulfate Nausea And Vomiting    Medications; Prior to Admission medications   Medication Sig Start Date End Date Taking? Authorizing Provider  albuterol (PROVENTIL HFA;VENTOLIN HFA) 108 (90 BASE) MCG/ACT inhaler Inhale 2 puffs into the lungs every 4 (four) hours as needed for wheezing or shortness of breath. 06/13/14  Yes Janece Canterbury, MD  albuterol (PROVENTIL) (2.5 MG/3ML) 0.083% nebulizer solution INHALE 1 VIAL VIA NEBULIZER EVERY 6 HOURS AS NEEDED FOR WHEEZING 04/23/15  Yes Historical Provider, MD  aspirin EC 81 MG  tablet Take 81 mg by mouth at bedtime.   Yes Historical Provider, MD  budesonide-formoterol (SYMBICORT) 160-4.5 MCG/ACT inhaler Inhale 2 puffs into the lungs 2 (two) times daily.    Yes Historical Provider, MD  Cholecalciferol (VITAMIN D3) 2000 UNITS TABS Take 2,000 mg by mouth at bedtime.    Yes Historical Provider, MD  diclofenac (VOLTAREN) 75 MG EC tablet Take 75 mg by mouth 2 (two) times daily.  09/18/14 09/18/15 Yes Historical Provider, MD  metoprolol tartrate (LOPRESSOR) 25 MG tablet Take 25 mg by mouth 2 (two) times daily.    Yes Historical Provider, MD  mirtazapine (REMERON) 30 MG tablet Take 30 mg by mouth at bedtime.   Yes Historical Provider, MD  nitroGLYCERIN (NITROSTAT) 0.4 MG SL tablet Place 1 tablet (0.4 mg total) under the tongue every 5 (five) minutes as needed for chest pain. 06/13/14  Yes Janece Canterbury, MD  OXYGEN-HELIUM IN Inhale 3 L into the lungs daily. 2 liters   Yes Historical Provider, MD  pantoprazole (PROTONIX) 40 MG tablet Take 40 mg by mouth 2 (two) times daily.   Yes Historical Provider, MD  simethicone (MYLICON) 80 MG chewable tablet Chew 1 tablet (80 mg total) by mouth every 6 (six) hours as needed for flatulence. 01/29/15  Yes Tasrif Ahmed, MD  temazepam (RESTORIL) 30 MG capsule Take 30 mg by mouth at bedtime. for sleep 12/15/14  Yes Historical Provider, MD  tiotropium (SPIRIVA HANDIHALER) 18 MCG inhalation capsule Place 1 capsule (18 mcg total) into inhaler and inhale daily. 06/13/14  Yes Janece Canterbury, MD  vitamin B-12 1000 MCG tablet Take 1 tablet (1,000 mcg total) by mouth daily. 01/29/15  Yes Tasrif Ahmed, MD  amLODipine (NORVASC) 5 MG tablet Take 1 tablet (5 mg total) by mouth daily. Patient not taking: Reported on 05/21/2015 01/29/15   Dellia Nims, MD  benzonatate (TESSALON) 100 MG capsule Take 1 capsule (100 mg total) by mouth 2 (two) times daily. Patient not taking: Reported on 05/21/2015 01/29/15   Dellia Nims, MD  feeding supplement, ENSURE ENLIVE, (ENSURE  ENLIVE) LIQD Take 237 mLs by mouth daily. Patient not taking: Reported on 05/03/2015 01/29/15   Dellia Nims, MD  ferrous gluconate (FERGON) 324 MG tablet Take 1 tablet (324 mg total) by mouth daily. Patient not taking: Reported on 05/21/2015 01/29/15   Dellia Nims, MD  ondansetron (ZOFRAN) 4 MG tablet Take 1 tablet (4 mg total) by mouth every 6 (six) hours. Patient not taking: Reported on 05/21/2015 05/03/15   Nat Christen, MD  senna-docusate (SENOKOT-S) 8.6-50 MG per tablet Take 1 tablet by mouth at bedtime as needed for mild constipation. Patient not taking: Reported on 05/03/2015 01/29/15   Dellia Nims, MD   . aspirin EC  81 mg Oral QHS  . atorvastatin  40 mg Oral q1800  . budesonide-formoterol  2 puff Inhalation BID  .  Chlorhexidine Gluconate Cloth  6 each Topical Q0600  . cholecalciferol  2,000 Units Oral QHS  . feeding supplement  1 Container Oral BID PC  . guaiFENesin  600 mg Oral BID  . heparin  5,000 Units Subcutaneous 3 times per day  . metoCLOPramide (REGLAN) injection  10 mg Intravenous Q12H  . metoprolol tartrate  25 mg Oral BID  . mirtazapine  30 mg Oral QHS  . mupirocin ointment  1 application Nasal BID  . ondansetron (ZOFRAN) IV  4 mg Intravenous Once  . pantoprazole (PROTONIX) IV  40 mg Intravenous Q24H  . temazepam  30 mg Oral QHS  . tiotropium  18 mcg Inhalation Daily  . vitamin B-12  1,000 mcg Oral Daily   PRN Meds acetaminophen **OR** acetaminophen, albuterol, morphine injection, nitroGLYCERIN, ondansetron Results for orders placed or performed during the hospital encounter of 05/21/15 (from the past 48 hour(s))  CBC WITH DIFFERENTIAL     Status: Abnormal   Collection Time: 05/21/15  5:02 PM  Result Value Ref Range   WBC 7.6 4.0 - 10.5 K/uL   RBC 3.06 (L) 4.22 - 5.81 MIL/uL   Hemoglobin 8.3 (L) 13.0 - 17.0 g/dL   HCT 27.7 (L) 39.0 - 52.0 %   MCV 90.5 78.0 - 100.0 fL   MCH 27.1 26.0 - 34.0 pg   MCHC 30.0 30.0 - 36.0 g/dL   RDW 15.1 11.5 - 15.5 %   Platelets 315  150 - 400 K/uL   Neutrophils Relative % 79 %   Neutro Abs 5.9 1.7 - 7.7 K/uL   Lymphocytes Relative 9 %   Lymphs Abs 0.7 0.7 - 4.0 K/uL   Monocytes Relative 8 %   Monocytes Absolute 0.6 0.1 - 1.0 K/uL   Eosinophils Relative 4 %   Eosinophils Absolute 0.3 0.0 - 0.7 K/uL   Basophils Relative 0 %   Basophils Absolute 0.0 0.0 - 0.1 K/uL  Comprehensive metabolic panel     Status: Abnormal   Collection Time: 05/21/15  5:02 PM  Result Value Ref Range   Sodium 139 135 - 145 mmol/L   Potassium 3.3 (L) 3.5 - 5.1 mmol/L   Chloride 109 101 - 111 mmol/L   CO2 25 22 - 32 mmol/L   Glucose, Bld 99 65 - 99 mg/dL   BUN 19 6 - 20 mg/dL   Creatinine, Ser 1.30 (H) 0.61 - 1.24 mg/dL   Calcium 8.0 (L) 8.9 - 10.3 mg/dL   Total Protein 4.4 (L) 6.5 - 8.1 g/dL   Albumin 1.7 (L) 3.5 - 5.0 g/dL   AST 9 (L) 15 - 41 U/L   ALT 8 (L) 17 - 63 U/L   Alkaline Phosphatase 75 38 - 126 U/L   Total Bilirubin 0.3 0.3 - 1.2 mg/dL   GFR calc non Af Amer 48 (L) >60 mL/min   GFR calc Af Amer 56 (L) >60 mL/min    Comment: (NOTE) The eGFR has been calculated using the CKD EPI equation. This calculation has not been validated in all clinical situations. eGFR's persistently <60 mL/min signify possible Chronic Kidney Disease.    Anion gap 5 5 - 15  Troponin I     Status: Abnormal   Collection Time: 05/21/15  5:02 PM  Result Value Ref Range   Troponin I 0.04 (H) <0.031 ng/mL    Comment:        PERSISTENTLY INCREASED TROPONIN VALUES IN THE RANGE OF 0.04-0.49 ng/mL CAN BE SEEN IN:       -  UNSTABLE ANGINA       -CONGESTIVE HEART FAILURE       -MYOCARDITIS       -CHEST TRAUMA       -ARRYHTHMIAS       -LATE PRESENTING MYOCARDIAL INFARCTION       -COPD   CLINICAL FOLLOW-UP RECOMMENDED.   POC occult blood, ED     Status: None   Collection Time: 05/21/15  5:08 PM  Result Value Ref Range   Fecal Occult Bld NEGATIVE NEGATIVE  I-Stat CG4 Lactic Acid, ED  (not at [facility])     Status: None   Collection Time: 05/21/15  5:13  PM  Result Value Ref Range   Lactic Acid, Venous 0.62 0.5 - 2.0 mmol/L  Urinalysis, Routine w reflex microscopic (not at [facility])     Status: Abnormal   Collection Time: 05/21/15  5:27 PM  Result Value Ref Range   Color, Urine AMBER (A) YELLOW    Comment: BIOCHEMICALS MAY BE AFFECTED BY COLOR   APPearance CLEAR CLEAR   Specific Gravity, Urine 1.026 1.005 - 1.030   pH 5.5 5.0 - 8.0   Glucose, UA NEGATIVE NEGATIVE mg/dL   Hgb urine dipstick NEGATIVE NEGATIVE   Bilirubin Urine MODERATE (A) NEGATIVE   Ketones, ur 15 (A) NEGATIVE mg/dL   Protein, ur 30 (A) NEGATIVE mg/dL   Urobilinogen, UA 1.0 0.0 - 1.0 mg/dL   Nitrite NEGATIVE NEGATIVE   Leukocytes, UA NEGATIVE NEGATIVE  Urine microscopic-add on     Status: Abnormal   Collection Time: 05/21/15  5:27 PM  Result Value Ref Range   Squamous Epithelial / LPF RARE RARE   WBC, UA 0-2 <3 WBC/hpf   Bacteria, UA RARE RARE   Casts HYALINE CASTS (A) NEGATIVE   Urine-Other MUCOUS PRESENT   MRSA PCR Screening     Status: Abnormal   Collection Time: 05/21/15 10:16 PM  Result Value Ref Range   MRSA by PCR POSITIVE (A) NEGATIVE    Comment:        The GeneXpert MRSA Assay (FDA approved for NASAL specimens only), is one component of a comprehensive MRSA colonization surveillance program. It is not intended to diagnose MRSA infection nor to guide or monitor treatment for MRSA infections. RESULT CALLED TO, READ BACK BY AND VERIFIED WITH: [email or web address]  05/21/15 MKELLY   Lipase, blood     Status: None   Collection Time: 05/21/15 10:47 PM  Result Value Ref Range   Lipase 22 22 - 51 U/L  Troponin I (q 6hr x 3)     Status: None   Collection Time: 05/21/15 10:47 PM  Result Value Ref Range   Troponin I 0.03 <0.031 ng/mL    Comment:        NO INDICATION OF MYOCARDIAL INJURY.   Protime-INR     Status: None   Collection Time: 05/21/15 10:47 PM  Result Value Ref Range   Prothrombin Time 15.0 11.6 - 15.2 seconds   INR 1.16 0.00 - 1.49  APTT      Status: None   Collection Time: 05/21/15 10:47 PM  Result Value Ref Range   aPTT 36 24 - 37 seconds  Brain natriuretic peptide     Status: Abnormal   Collection Time: 05/22/15  4:12 AM  Result Value Ref Range   B Natriuretic Peptide 286.4 (H) 0.0 - 100.0 pg/mL  Troponin I (q 6hr x 3)     Status: Abnormal   Collection Time: 05/22/15  4:12 AM  Result Value Ref  Range   Troponin I 0.04 (H) <0.031 ng/mL    Comment:        PERSISTENTLY INCREASED TROPONIN VALUES IN THE RANGE OF 0.04-0.49 ng/mL CAN BE SEEN IN:       -UNSTABLE ANGINA       -CONGESTIVE HEART FAILURE       -MYOCARDITIS       -CHEST TRAUMA       -ARRYHTHMIAS       -LATE PRESENTING MYOCARDIAL INFARCTION       -COPD   CLINICAL FOLLOW-UP RECOMMENDED.   Basic metabolic panel     Status: Abnormal   Collection Time: 05/22/15  4:12 AM  Result Value Ref Range   Sodium 144 135 - 145 mmol/L   Potassium 3.5 3.5 - 5.1 mmol/L   Chloride 114 (H) 101 - 111 mmol/L   CO2 22 22 - 32 mmol/L   Glucose, Bld 67 65 - 99 mg/dL   BUN 16 6 - 20 mg/dL   Creatinine, Ser 1.06 0.61 - 1.24 mg/dL   Calcium 7.8 (L) 8.9 - 10.3 mg/dL   GFR calc non Af Amer >60 >60 mL/min   GFR calc Af Amer >60 >60 mL/min    Comment: (NOTE) The eGFR has been calculated using the CKD EPI equation. This calculation has not been validated in all clinical situations. eGFR's persistently <60 mL/min signify possible Chronic Kidney Disease.    Anion gap 8 5 - 15  CBC     Status: Abnormal   Collection Time: 05/22/15  4:12 AM  Result Value Ref Range   WBC 5.2 4.0 - 10.5 K/uL   RBC 2.88 (L) 4.22 - 5.81 MIL/uL   Hemoglobin 8.0 (L) 13.0 - 17.0 g/dL   HCT 26.1 (L) 39.0 - 52.0 %   MCV 90.6 78.0 - 100.0 fL   MCH 27.8 26.0 - 34.0 pg   MCHC 30.7 30.0 - 36.0 g/dL   RDW 15.0 11.5 - 15.5 %   Platelets 263 150 - 400 K/uL  Lipid panel     Status: Abnormal   Collection Time: 05/22/15  4:12 AM  Result Value Ref Range   Cholesterol 106 0 - 200 mg/dL   Triglycerides 84 <150  mg/dL   HDL 32 (L) >40 mg/dL   Total CHOL/HDL Ratio 3.3 RATIO   VLDL 17 0 - 40 mg/dL   LDL Cholesterol 57 0 - 99 mg/dL    Comment:        Total Cholesterol/HDL:CHD Risk Coronary Heart Disease Risk Table                     Men   Women  1/2 Average Risk   3.4   3.3  Average Risk       5.0   4.4  2 X Average Risk   9.6   7.1  3 X Average Risk  23.4   11.0        Use the calculated Patient Ratio above and the CHD Risk Table to determine the patient's CHD Risk.        ATP III CLASSIFICATION (LDL):  <100     mg/dL   Optimal  100-129  mg/dL   Near or Above                    Optimal  130-159  mg/dL   Borderline  160-189  mg/dL   High  >190     mg/dL   Very High  Glucose, capillary     Status: Abnormal   Collection Time: 05/22/15  6:03 AM  Result Value Ref Range   Glucose-Capillary 58 (L) 65 - 99 mg/dL  Glucose, capillary     Status: None   Collection Time: 05/22/15  6:41 AM  Result Value Ref Range   Glucose-Capillary 85 65 - 99 mg/dL  Glucose, capillary     Status: None   Collection Time: 05/22/15  8:45 AM  Result Value Ref Range   Glucose-Capillary 94 65 - 99 mg/dL  Troponin I (q 6hr x 3)     Status: Abnormal   Collection Time: 05/22/15 11:00 AM  Result Value Ref Range   Troponin I 0.04 (H) <0.031 ng/mL    Comment:        PERSISTENTLY INCREASED TROPONIN VALUES IN THE RANGE OF 0.04-0.49 ng/mL CAN BE SEEN IN:       -UNSTABLE ANGINA       -CONGESTIVE HEART FAILURE       -MYOCARDITIS       -CHEST TRAUMA       -ARRYHTHMIAS       -LATE PRESENTING MYOCARDIAL INFARCTION       -COPD   CLINICAL FOLLOW-UP RECOMMENDED.     Dg Chest Port 1 View  05/21/2015   CLINICAL DATA:  Acute onset of cough.  Initial encounter.  EXAM: PORTABLE CHEST 1 VIEW  COMPARISON:  Chest radiograph performed earlier today at 7:37 p.m.  FINDINGS: Increased interstitial markings appear to be chronic in nature, with underlying scarring and emphysematous change. No pleural effusion or pneumothorax is  seen.  The cardiomediastinal silhouette is normal in size. No acute osseous abnormalities are identified.  IMPRESSION: Chronic lung changes noted, with scarring and emphysematous change. No definite acute airspace consolidation seen.   Electronically Signed   By: Garald Balding M.D.   On: 05/21/2015 22:49   Dg Abd Acute W/chest  05/21/2015   CLINICAL DATA:  Nausea and vomiting and dizziness.  EXAM: DG ABDOMEN ACUTE W/ 1V CHEST  COMPARISON:  05/17/2015, 05/03/2015  FINDINGS: Hyperinflation evident with mild interstitial prominence, suspect background COPD/emphysema. Normal heart size and vascularity. Bibasilar atelectasis versus scarring as before. No new superimposed pneumonia, collapse or consolidation. No effusion or pneumothorax. No significant edema pattern. Stable mild left hilar prominence, suspect vascular markings. Atherosclerosis of the aorta. Bones are osteopenic. Degenerative changes of the spine.  Large amount retained barium throughout the entire colon. No obstruction pattern. No significant ileus. Aortoiliac extensive atherosclerosis. No free air on the decubitus view.  IMPRESSION: Stable chronic COPD/ emphysema and parenchymal scarring.  Large volume of retained barium throughout the colon. Negative for obstruction or free air.  Aortoiliac atherosclerosis  Osteopenia and degenerative changes of the spine.   Electronically Signed   By: Jerilynn Mages.  Shick M.D.   On: 05/21/2015 20:03               Blood pressure 118/58, pulse 67, temperature 97.5 F (36.4 C), temperature source Oral, resp. rate 17, height 5' 10"  (1.778 m), weight 46.72 kg (103 lb), SpO2 100 %.  Physical exam:   General--thin frail appearing white male with nasal cannula in place  ENT--nonicteric   Heart--diminished heart sounds  Lungs--diminished lung sounds  Abdomen--nondistended soft and essentially nontender with good bowel sounds  Psych--alert oriented and appears to be appropriate   Assessment: 1. Nausea  vomiting/weight loss. The patient has had a partial gastrectomy with Billroth II. The upper G.I. did not clearly shown obstruction but he  is at risk for gastric neoplasia this far out after partial gastrectomy. I agree EGD would be appropriate 2. COPD. 02 dependent 3. CHD 4. Peripheral and cerebrovascular disease 5. CKD stage III  Plan: when will plan EGD tomorrow. Have discussed this with the patient and we will plan on using propofol sedation.   Sander Remedios JR,Ishita Mcnerney L 05/22/2015, 3:13 PM   Pager: [phone] If no answer or after hours call [phone]

## 2015-05-22 NOTE — Evaluation (Signed)
Clinical/Bedside Swallow Evaluation Patient Details  Name: Alec Walker MRN: [id number] Date of Birth: 1926/12/20  Today's Date: 05/22/2015 Time: SLP Start Time (ACUTE ONLY): 0815 SLP Stop Time (ACUTE ONLY): 0830 SLP Time Calculation (min) (ACUTE ONLY): 15 min  Past Medical History:  Past Medical History  Diagnosis Date  . COPD (chronic obstructive pulmonary disease) ([facility])   . CAD (coronary artery disease)     a.  nonobstructive CAD by cath 1999. b. Myoview 04/2012: fixed inferior and inferoseptal bowel attenuation artifact, no reversible ischemia. EF 62%. Low risk scan.  Marland Kitchen HTN (hypertension)   . PUD (peptic ulcer disease)     a. s/p surgery 1976.  Marland Kitchen Syncope     2012  . Carotid artery disease ([facility]) 03/22/13    a. 50-69% BICA by duplex 03/2013, repeat needed 11/2013.  Marland Kitchen PAD (peripheral artery disease) ([facility])     a. prior stenting of RCIA 1999. b. history of 90% vertebral artery narrowing, 40% subclavian artery narrowing. c. Diagnostic PV angio 08/2013 for progressive claudication - will ultimately need endarterectomy/patch angio on bilat CFA stenosis; will also need atherectomy/PT/stenting to REIA.   . CKD (chronic kidney disease), stage III   . HLD (hyperlipidemia)   . Chronic respiratory failure ([facility])     a. On home O2.  Marland Kitchen NSVT (nonsustained ventricular tachycardia) ([facility])     a. Brief NSVT (6b) during 08/2013 adm.  . Stroke ([facility])   . Shortness of breath   . On supplemental oxygen therapy     2l   Past Surgical History:  Past Surgical History  Procedure Laterality Date  . Cataract extraction    . Gastrectomy    . Transurethral resection of prostate    . Coronary angioplasty with stent placement      stent in 2003; history of right common iliac artery stent hx  . Endarterectomy femoral Bilateral 11/11/2013    Procedure: BILATERAL FEMORAL ENDARTERECTOMIES WITH BILATERAL PATCH ANGIOPLASTIES;  Surgeon: Nada Libman, MD;  Location: [facility] OR;  Service: Vascular;  Laterality:  Bilateral;  . Lower extremity angiogram N/A 09/01/2013    Procedure: LOWER EXTREMITY ANGIOGRAM;  Surgeon: Runell Gess, MD;  Location: [facility] CATH LAB;  Service: Cardiovascular;  Laterality: N/A;   HPI:  Alec Walker is a 81 y.o. male with PMH of HTN, hyperlipidemia, GERD, CAD, S/P stent placement, COPD on 3 L oxygen at home, PUD, PAD, CKD-III, stroke, anemia, diastolic congestive heart failure, s/p of billroth II procedure, who presented to [facility] on 05/21/15 with nausea, vomiting, regurgitation, weight loss (13 lbs over 4wks). Pt has a history of swallowing and esophageal issues: typical EGD with obstruction from Billroth II procedure in 1979, sigmoidoscopy in 1982 indicating IBS, UGI w/KUB on 05/17/15 which showed no stricture or mass ID to account for pt's weightloss and regurgitation. Previously seen by SLP June 2016, BSE report no evidence of aspiration or oral or oropharyngeal dysphagia, UGI showed severe gastroesophageal reflux, at risk for postprandial aspiration, recommended GI evaluation. Pt is currently NPO.    Assessment / Plan / Recommendation Clinical Impression  Pt tolerated all consistencies assessed with no overt s/sx of aspiration. Pt does not demonstrate any signs or symptoms of oral or oropharyngeal dysphagia. SLP reviewed esophageal precautions and left pt a hand out. SLP recommends dysphagia 2 (fine chop) and thin liquid diet, this is what the pt typically eats at home. Pt reported dry, stringy food sometimes gets stuck, complains of GER and indigestion; coughing up and vomiting partly  digested food after meals. SLP recommends GI consult, no SLP f/u needed at this time.     Aspiration Risk  Mild    Diet Recommendation Dysphagia 2 (Fine chop);Thin   Medication Administration: Whole meds with liquid Compensations: Small sips/bites;Slow rate    Other  Recommendations Recommended Consults: Consider GI evaluation Oral Care Recommendations: Oral care BID   Follow Up  Recommendations       Frequency and Duration        Pertinent Vitals/Pain NA    SLP Swallow Goals     Swallow Study Prior Functional Status       General Other Pertinent Information: Alec Walker is a 81 y.o. male with PMH of HTN, hyperlipidemia, GERD, CAD, S/P stent placement, COPD on 3 L oxygen at home, PUD, PAD, CKD-III, stroke, anemia, diastolic congestive heart failure, s/p of billroth II procedure, who presented [facility] on 05/21/15 with nausea, vomiting, regurgitation, weight loss (13 lbs over 4wks). Pt has a history of swallowing and esophageal issues: typical EGD with obstruction from Billroth II procedure in 1979, sigmoidoscopy in 1982 indicating IBS, UGI w/KUB on 05/17/15 which showed no stricture or mass ID to account for pt's weightloss and regurgitation. Previously seen by SLP June 2016, BSE report no evidence of aspiration or oral or oropharyngeal dysphagia, UGI showed severe gastroesophageal reflux, at risk for postprandial aspiration, recommended GI evaluation. Pt is currently NPO.  Type of Study: Bedside swallow evaluation Diet Prior to this Study: NPO Temperature Spikes Noted: No Respiratory Status: Supplemental O2 delivered via (comment) History of Recent Intubation: No Behavior/Cognition: Alert;Cooperative;Pleasant mood Oral Cavity - Dentition: Poor condition;Missing dentition (missing upper teeth) Self-Feeding Abilities: Able to feed self Patient Positioning: Upright in bed Baseline Vocal Quality: Normal Volitional Cough: Congested Volitional Swallow: Able to elicit    Oral/Motor/Sensory Function Overall Oral Motor/Sensory Function: Appears within functional limits for tasks assessed   Ice Chips Ice chips: Not tested   Thin Liquid Thin Liquid: Within functional limits Presentation: Straw;Cup    Nectar Thick Nectar Thick Liquid: Not tested   Honey Thick Honey Thick Liquid: Not tested   Puree Puree: Within functional limits Presentation: Self Fed;Spoon    Solid   GO    Solid: Within functional limits Presentation: Self Fed      Riccardo Dubin, Student-SLP  Riccardo Dubin 05/22/2015,9:19 AM

## 2015-05-23 ENCOUNTER — Inpatient Hospital Stay (HOSPITAL_COMMUNITY): Payer: Medicare Other | Admitting: Anesthesiology

## 2015-05-23 ENCOUNTER — Encounter (HOSPITAL_COMMUNITY): Payer: Self-pay | Admitting: Certified Registered Nurse Anesthetist

## 2015-05-23 ENCOUNTER — Encounter (HOSPITAL_COMMUNITY)
Admission: EM | Disposition: A | Discharge status: Skilled Nursing Facility | Payer: Self-pay | Source: Home / Self Care | Attending: Internal Medicine

## 2015-05-23 DIAGNOSIS — K2210 Ulcer of esophagus without bleeding: Secondary | ICD-10-CM

## 2015-05-23 HISTORY — PX: ESOPHAGOGASTRODUODENOSCOPY: SHX5428

## 2015-05-23 LAB — BASIC METABOLIC PANEL
ANION GAP: 7 (ref 5–15)
BUN: 12 mg/dL (ref 6–20)
CO2: 22 mmol/L (ref 22–32)
Calcium: 7.8 mg/dL — ABNORMAL LOW (ref 8.9–10.3)
Chloride: 110 mmol/L (ref 101–111)
Creatinine, Ser: 0.98 mg/dL (ref 0.61–1.24)
Glucose, Bld: 106 mg/dL — ABNORMAL HIGH (ref 65–99)
POTASSIUM: 3.8 mmol/L (ref 3.5–5.1)
SODIUM: 139 mmol/L (ref 135–145)

## 2015-05-23 LAB — HEMOGLOBIN A1C
Hgb A1c MFr Bld: 6.2 % — ABNORMAL HIGH (ref 4.8–5.6)
MEAN PLASMA GLUCOSE: 131 mg/dL

## 2015-05-23 LAB — GLUCOSE, CAPILLARY: GLUCOSE-CAPILLARY: 85 mg/dL (ref 65–99)

## 2015-05-23 SURGERY — EGD (ESOPHAGOGASTRODUODENOSCOPY)
Anesthesia: Monitor Anesthesia Care

## 2015-05-23 MED ORDER — LACTATED RINGERS IV SOLN
INTRAVENOUS | Status: DC | PRN
  Administered 2015-05-23: 12:00:00 via INTRAVENOUS

## 2015-05-23 MED ORDER — SUCRALFATE 1 GM/10ML PO SUSP
1.0000 g | Freq: Three times a day (TID) | ORAL | Status: DC
Start: 2015-05-23 — End: 2015-05-25
  Administered 2015-05-23 – 2015-05-25 (×8): 1 g via ORAL
  Filled 2015-05-23 (×11): qty 10

## 2015-05-23 MED ORDER — PROPOFOL 10 MG/ML IV BOLUS
INTRAVENOUS | Status: DC | PRN
  Administered 2015-05-23 (×2): 20 mg via INTRAVENOUS
  Administered 2015-05-23: 10 mg via INTRAVENOUS

## 2015-05-23 NOTE — Anesthesia Preprocedure Evaluation (Signed)
Anesthesia Evaluation  Patient identified by MRN, date of birth, ID band Patient awake    Reviewed: Allergy & Precautions, NPO status , Patient's Chart, lab work & pertinent test results  Airway Mallampati: II       Dental  (+) Edentulous Upper   Pulmonary shortness of breath, COPD,  oxygen dependent, former smoker,     + decreased breath sounds      Cardiovascular hypertension, + CAD, + Peripheral Vascular Disease and + DOE   Rhythm:Regular Rate:Normal     Neuro/Psych CVA    GI/Hepatic PUD,   Endo/Other    Renal/GU Renal InsufficiencyRenal disease     Musculoskeletal   Abdominal   Peds  Hematology   Anesthesia Other Findings   Reproductive/Obstetrics                             Anesthesia Physical Anesthesia Plan  ASA: IV  Anesthesia Plan: MAC   Post-op Pain Management:    Induction: Intravenous  Airway Management Planned: Natural Airway and Simple Face Mask  Additional Equipment:   Intra-op Plan:   Post-operative Plan:   Informed Consent: I have reviewed the patients History and Physical, chart, labs and discussed the procedure including the risks, benefits and alternatives for the proposed anesthesia with the patient or authorized representative who has indicated his/her understanding and acceptance.     Plan Discussed with: CRNA and Surgeon  Anesthesia Plan Comments:         Anesthesia Quick Evaluation

## 2015-05-23 NOTE — Anesthesia Postprocedure Evaluation (Signed)
  Anesthesia Post-op Note  Patient: Alec Walker  Procedure(s) Performed: Procedure(s): ESOPHAGOGASTRODUODENOSCOPY (EGD) (N/A)  Patient Location: Endoscopy Unit  Anesthesia Type:MAC  Level of Consciousness: awake and alert   Airway and Oxygen Therapy: Patient Spontanous Breathing  Post-op Pain: none  Post-op Assessment: Post-op Vital signs reviewed              Post-op Vital Signs: stable  Last Vitals:  Filed Vitals:   05/23/15 1319  BP: 111/52  Pulse: 69  Temp: 36.2 C  Resp: 20    Complications: No apparent anesthesia complications

## 2015-05-23 NOTE — H&P (View-Only) (Signed)
[facility] Reason for consult: nausea and vomiting Referring Physician: [facility]. PCP Dr. Melford Aase. Primary G.I.: unassigned  Alec Walker is an 81 y.o. male.  HPI: he has chronic COPD and is on 3 L of oxygen at home. He has CKD stage III in history of heart failure. He has had previous ulcer disease and approximately 30 years ago had an ulcer operation that is described in the records as a partial gastrectomy with billroth 2 anastomosis he has had one week or more of nausea vomiting and weight loss but admits it may be longer. Over the past several weeks he's lost over 10 pounds. He has had previous upper G.I. series revealed normal esophagus and no clear ulceration or obstruction. Patient is continued to have nausea and vomiting. In EGD is requested. His home medication list indicates the use of diclofenac as well as steroids. He has been on Protonix at home.  Past Medical History  Diagnosis Date  . COPD (chronic obstructive pulmonary disease) ([place])   . CAD (coronary artery disease)     a.  nonobstructive CAD by cath 1999. b. Myoview 04/2012: fixed inferior and inferoseptal bowel attenuation artifact, no reversible ischemia. EF 62%. Low risk scan.  Marland Kitchen HTN (hypertension)   . PUD (peptic ulcer disease)     a. s/p surgery 1976.  Marland Kitchen Syncope     2012  . Carotid artery disease ([place]) 03/22/13    a. 50-69% BICA by duplex 03/2013, repeat needed 11/2013.  Marland Kitchen PAD (peripheral artery disease) ([place])     a. prior stenting of RCIA 1999. b. history of 90% vertebral artery narrowing, 40% subclavian artery narrowing. c. Diagnostic PV angio 08/2013 for progressive claudication - will ultimately need endarterectomy/patch angio on bilat CFA stenosis; will also need atherectomy/PT/stenting to REIA.   . CKD (chronic kidney disease), stage III   . HLD (hyperlipidemia)   . Chronic respiratory failure ([place])     a. On home O2.  Marland Kitchen NSVT (nonsustained ventricular tachycardia) ([place])     a. Brief  NSVT (6b) during 08/2013 adm.  . Stroke ([place])   . Shortness of breath   . On supplemental oxygen therapy     2l    Past Surgical History  Procedure Laterality Date  . Cataract extraction    . Gastrectomy    . Transurethral resection of prostate    . Coronary angioplasty with stent placement      stent in 2003; history of right common iliac artery stent hx  . Endarterectomy femoral Bilateral 11/11/2013    Procedure: BILATERAL FEMORAL ENDARTERECTOMIES WITH BILATERAL PATCH ANGIOPLASTIES;  Surgeon: Serafina Mitchell, MD;  Location: [facility];  Service: Vascular;  Laterality: Bilateral;  . Lower extremity angiogram N/A 09/01/2013    Procedure: LOWER EXTREMITY ANGIOGRAM;  Surgeon: Lorretta Harp, MD;  Location: [facility] CATH LAB;  Service: Cardiovascular;  Laterality: N/A;    Family History  Problem Relation Age of Onset  . Hypertension    . Coronary artery disease    . Stroke Mother   . Heart disease Mother   . Hypertension Mother   . Heart attack Mother   . Heart attack Brother   . Heart disease Brother   . Heart disease Father   . Hypertension Father   . Heart attack Father     Social History:  reports that he quit smoking about 18 years ago. His smoking use included Cigarettes. He has a 60 pack-year smoking history. He has never used smokeless  tobacco. He reports that he does not drink alcohol or use illicit drugs.  Allergies:  Allergies  Allergen Reactions  . Ferrous Sulfate Nausea And Vomiting    Medications; Prior to Admission medications   Medication Sig Start Date End Date Taking? Authorizing Provider  albuterol (PROVENTIL HFA;VENTOLIN HFA) 108 (90 BASE) MCG/ACT inhaler Inhale 2 puffs into the lungs every 4 (four) hours as needed for wheezing or shortness of breath. 06/13/14  Yes Janece Canterbury, MD  albuterol (PROVENTIL) (2.5 MG/3ML) 0.083% nebulizer solution INHALE 1 VIAL VIA NEBULIZER EVERY 6 HOURS AS NEEDED FOR WHEEZING 04/23/15  Yes Historical Provider, MD  aspirin EC 81 MG  tablet Take 81 mg by mouth at bedtime.   Yes Historical Provider, MD  budesonide-formoterol (SYMBICORT) 160-4.5 MCG/ACT inhaler Inhale 2 puffs into the lungs 2 (two) times daily.    Yes Historical Provider, MD  Cholecalciferol (VITAMIN D3) 2000 UNITS TABS Take 2,000 mg by mouth at bedtime.    Yes Historical Provider, MD  diclofenac (VOLTAREN) 75 MG EC tablet Take 75 mg by mouth 2 (two) times daily.  09/18/14 09/18/15 Yes Historical Provider, MD  metoprolol tartrate (LOPRESSOR) 25 MG tablet Take 25 mg by mouth 2 (two) times daily.    Yes Historical Provider, MD  mirtazapine (REMERON) 30 MG tablet Take 30 mg by mouth at bedtime.   Yes Historical Provider, MD  nitroGLYCERIN (NITROSTAT) 0.4 MG SL tablet Place 1 tablet (0.4 mg total) under the tongue every 5 (five) minutes as needed for chest pain. 06/13/14  Yes Janece Canterbury, MD  OXYGEN-HELIUM IN Inhale 3 L into the lungs daily. 2 liters   Yes Historical Provider, MD  pantoprazole (PROTONIX) 40 MG tablet Take 40 mg by mouth 2 (two) times daily.   Yes Historical Provider, MD  simethicone (MYLICON) 80 MG chewable tablet Chew 1 tablet (80 mg total) by mouth every 6 (six) hours as needed for flatulence. 01/29/15  Yes Tasrif Ahmed, MD  temazepam (RESTORIL) 30 MG capsule Take 30 mg by mouth at bedtime. for sleep 12/15/14  Yes Historical Provider, MD  tiotropium (SPIRIVA HANDIHALER) 18 MCG inhalation capsule Place 1 capsule (18 mcg total) into inhaler and inhale daily. 06/13/14  Yes Janece Canterbury, MD  vitamin B-12 1000 MCG tablet Take 1 tablet (1,000 mcg total) by mouth daily. 01/29/15  Yes Tasrif Ahmed, MD  amLODipine (NORVASC) 5 MG tablet Take 1 tablet (5 mg total) by mouth daily. Patient not taking: Reported on 05/21/2015 01/29/15   Dellia Nims, MD  benzonatate (TESSALON) 100 MG capsule Take 1 capsule (100 mg total) by mouth 2 (two) times daily. Patient not taking: Reported on 05/21/2015 01/29/15   Dellia Nims, MD  feeding supplement, ENSURE ENLIVE, (ENSURE  ENLIVE) LIQD Take 237 mLs by mouth daily. Patient not taking: Reported on 05/03/2015 01/29/15   Dellia Nims, MD  ferrous gluconate (FERGON) 324 MG tablet Take 1 tablet (324 mg total) by mouth daily. Patient not taking: Reported on 05/21/2015 01/29/15   Dellia Nims, MD  ondansetron (ZOFRAN) 4 MG tablet Take 1 tablet (4 mg total) by mouth every 6 (six) hours. Patient not taking: Reported on 05/21/2015 05/03/15   Nat Christen, MD  senna-docusate (SENOKOT-S) 8.6-50 MG per tablet Take 1 tablet by mouth at bedtime as needed for mild constipation. Patient not taking: Reported on 05/03/2015 01/29/15   Dellia Nims, MD   . aspirin EC  81 mg Oral QHS  . atorvastatin  40 mg Oral q1800  . budesonide-formoterol  2 puff Inhalation BID  .  Chlorhexidine Gluconate Cloth  6 each Topical Q0600  . cholecalciferol  2,000 Units Oral QHS  . feeding supplement  1 Container Oral BID PC  . guaiFENesin  600 mg Oral BID  . heparin  5,000 Units Subcutaneous 3 times per day  . metoCLOPramide (REGLAN) injection  10 mg Intravenous Q12H  . metoprolol tartrate  25 mg Oral BID  . mirtazapine  30 mg Oral QHS  . mupirocin ointment  1 application Nasal BID  . ondansetron (ZOFRAN) IV  4 mg Intravenous Once  . pantoprazole (PROTONIX) IV  40 mg Intravenous Q24H  . temazepam  30 mg Oral QHS  . tiotropium  18 mcg Inhalation Daily  . vitamin B-12  1,000 mcg Oral Daily   PRN Meds acetaminophen **OR** acetaminophen, albuterol, morphine injection, nitroGLYCERIN, ondansetron Results for orders placed or performed during the hospital encounter of 05/21/15 (from the past 48 hour(s))  CBC WITH DIFFERENTIAL     Status: Abnormal   Collection Time: 05/21/15  5:02 PM  Result Value Ref Range   WBC 7.6 4.0 - 10.5 K/uL   RBC 3.06 (L) 4.22 - 5.81 MIL/uL   Hemoglobin 8.3 (L) 13.0 - 17.0 g/dL   HCT 27.7 (L) 39.0 - 52.0 %   MCV 90.5 78.0 - 100.0 fL   MCH 27.1 26.0 - 34.0 pg   MCHC 30.0 30.0 - 36.0 g/dL   RDW 15.1 11.5 - 15.5 %   Platelets 315  150 - 400 K/uL   Neutrophils Relative % 79 %   Neutro Abs 5.9 1.7 - 7.7 K/uL   Lymphocytes Relative 9 %   Lymphs Abs 0.7 0.7 - 4.0 K/uL   Monocytes Relative 8 %   Monocytes Absolute 0.6 0.1 - 1.0 K/uL   Eosinophils Relative 4 %   Eosinophils Absolute 0.3 0.0 - 0.7 K/uL   Basophils Relative 0 %   Basophils Absolute 0.0 0.0 - 0.1 K/uL  Comprehensive metabolic panel     Status: Abnormal   Collection Time: 05/21/15  5:02 PM  Result Value Ref Range   Sodium 139 135 - 145 mmol/L   Potassium 3.3 (L) 3.5 - 5.1 mmol/L   Chloride 109 101 - 111 mmol/L   CO2 25 22 - 32 mmol/L   Glucose, Bld 99 65 - 99 mg/dL   BUN 19 6 - 20 mg/dL   Creatinine, Ser 1.30 (H) 0.61 - 1.24 mg/dL   Calcium 8.0 (L) 8.9 - 10.3 mg/dL   Total Protein 4.4 (L) 6.5 - 8.1 g/dL   Albumin 1.7 (L) 3.5 - 5.0 g/dL   AST 9 (L) 15 - 41 U/L   ALT 8 (L) 17 - 63 U/L   Alkaline Phosphatase 75 38 - 126 U/L   Total Bilirubin 0.3 0.3 - 1.2 mg/dL   GFR calc non Af Amer 48 (L) >60 mL/min   GFR calc Af Amer 56 (L) >60 mL/min    Comment: (NOTE) The eGFR has been calculated using the CKD EPI equation. This calculation has not been validated in all clinical situations. eGFR's persistently <60 mL/min signify possible Chronic Kidney Disease.    Anion gap 5 5 - 15  Troponin I     Status: Abnormal   Collection Time: 05/21/15  5:02 PM  Result Value Ref Range   Troponin I 0.04 (H) <0.031 ng/mL    Comment:        PERSISTENTLY INCREASED TROPONIN VALUES IN THE RANGE OF 0.04-0.49 ng/mL CAN BE SEEN IN:       -  UNSTABLE ANGINA       -CONGESTIVE HEART FAILURE       -MYOCARDITIS       -CHEST TRAUMA       -ARRYHTHMIAS       -LATE PRESENTING MYOCARDIAL INFARCTION       -COPD   CLINICAL FOLLOW-UP RECOMMENDED.   POC occult blood, ED     Status: None   Collection Time: 05/21/15  5:08 PM  Result Value Ref Range   Fecal Occult Bld NEGATIVE NEGATIVE  I-Stat CG4 Lactic Acid, ED  (not at [facility])     Status: None   Collection Time: 05/21/15  5:13  PM  Result Value Ref Range   Lactic Acid, Venous 0.62 0.5 - 2.0 mmol/L  Urinalysis, Routine w reflex microscopic (not at [facility])     Status: Abnormal   Collection Time: 05/21/15  5:27 PM  Result Value Ref Range   Color, Urine AMBER (A) YELLOW    Comment: BIOCHEMICALS MAY BE AFFECTED BY COLOR   APPearance CLEAR CLEAR   Specific Gravity, Urine 1.026 1.005 - 1.030   pH 5.5 5.0 - 8.0   Glucose, UA NEGATIVE NEGATIVE mg/dL   Hgb urine dipstick NEGATIVE NEGATIVE   Bilirubin Urine MODERATE (A) NEGATIVE   Ketones, ur 15 (A) NEGATIVE mg/dL   Protein, ur 30 (A) NEGATIVE mg/dL   Urobilinogen, UA 1.0 0.0 - 1.0 mg/dL   Nitrite NEGATIVE NEGATIVE   Leukocytes, UA NEGATIVE NEGATIVE  Urine microscopic-add on     Status: Abnormal   Collection Time: 05/21/15  5:27 PM  Result Value Ref Range   Squamous Epithelial / LPF RARE RARE   WBC, UA 0-2 <3 WBC/hpf   Bacteria, UA RARE RARE   Casts HYALINE CASTS (A) NEGATIVE   Urine-Other MUCOUS PRESENT   MRSA PCR Screening     Status: Abnormal   Collection Time: 05/21/15 10:16 PM  Result Value Ref Range   MRSA by PCR POSITIVE (A) NEGATIVE    Comment:        The GeneXpert MRSA Assay (FDA approved for NASAL specimens only), is one component of a comprehensive MRSA colonization surveillance program. It is not intended to diagnose MRSA infection nor to guide or monitor treatment for MRSA infections. RESULT CALLED TO, READ BACK BY AND VERIFIED WITH: [email or web address]  05/21/15 MKELLY   Lipase, blood     Status: None   Collection Time: 05/21/15 10:47 PM  Result Value Ref Range   Lipase 22 22 - 51 U/L  Troponin I (q 6hr x 3)     Status: None   Collection Time: 05/21/15 10:47 PM  Result Value Ref Range   Troponin I 0.03 <0.031 ng/mL    Comment:        NO INDICATION OF MYOCARDIAL INJURY.   Protime-INR     Status: None   Collection Time: 05/21/15 10:47 PM  Result Value Ref Range   Prothrombin Time 15.0 11.6 - 15.2 seconds   INR 1.16 0.00 - 1.49  APTT      Status: None   Collection Time: 05/21/15 10:47 PM  Result Value Ref Range   aPTT 36 24 - 37 seconds  Brain natriuretic peptide     Status: Abnormal   Collection Time: 05/22/15  4:12 AM  Result Value Ref Range   B Natriuretic Peptide 286.4 (H) 0.0 - 100.0 pg/mL  Troponin I (q 6hr x 3)     Status: Abnormal   Collection Time: 05/22/15  4:12 AM  Result Value Ref  Range   Troponin I 0.04 (H) <0.031 ng/mL    Comment:        PERSISTENTLY INCREASED TROPONIN VALUES IN THE RANGE OF 0.04-0.49 ng/mL CAN BE SEEN IN:       -UNSTABLE ANGINA       -CONGESTIVE HEART FAILURE       -MYOCARDITIS       -CHEST TRAUMA       -ARRYHTHMIAS       -LATE PRESENTING MYOCARDIAL INFARCTION       -COPD   CLINICAL FOLLOW-UP RECOMMENDED.   Basic metabolic panel     Status: Abnormal   Collection Time: 05/22/15  4:12 AM  Result Value Ref Range   Sodium 144 135 - 145 mmol/L   Potassium 3.5 3.5 - 5.1 mmol/L   Chloride 114 (H) 101 - 111 mmol/L   CO2 22 22 - 32 mmol/L   Glucose, Bld 67 65 - 99 mg/dL   BUN 16 6 - 20 mg/dL   Creatinine, Ser 1.06 0.61 - 1.24 mg/dL   Calcium 7.8 (L) 8.9 - 10.3 mg/dL   GFR calc non Af Amer >60 >60 mL/min   GFR calc Af Amer >60 >60 mL/min    Comment: (NOTE) The eGFR has been calculated using the CKD EPI equation. This calculation has not been validated in all clinical situations. eGFR's persistently <60 mL/min signify possible Chronic Kidney Disease.    Anion gap 8 5 - 15  CBC     Status: Abnormal   Collection Time: 05/22/15  4:12 AM  Result Value Ref Range   WBC 5.2 4.0 - 10.5 K/uL   RBC 2.88 (L) 4.22 - 5.81 MIL/uL   Hemoglobin 8.0 (L) 13.0 - 17.0 g/dL   HCT 26.1 (L) 39.0 - 52.0 %   MCV 90.6 78.0 - 100.0 fL   MCH 27.8 26.0 - 34.0 pg   MCHC 30.7 30.0 - 36.0 g/dL   RDW 15.0 11.5 - 15.5 %   Platelets 263 150 - 400 K/uL  Lipid panel     Status: Abnormal   Collection Time: 05/22/15  4:12 AM  Result Value Ref Range   Cholesterol 106 0 - 200 mg/dL   Triglycerides 84 <150  mg/dL   HDL 32 (L) >40 mg/dL   Total CHOL/HDL Ratio 3.3 RATIO   VLDL 17 0 - 40 mg/dL   LDL Cholesterol 57 0 - 99 mg/dL    Comment:        Total Cholesterol/HDL:CHD Risk Coronary Heart Disease Risk Table                     Men   Women  1/2 Average Risk   3.4   3.3  Average Risk       5.0   4.4  2 X Average Risk   9.6   7.1  3 X Average Risk  23.4   11.0        Use the calculated Patient Ratio above and the CHD Risk Table to determine the patient's CHD Risk.        ATP III CLASSIFICATION (LDL):  <100     mg/dL   Optimal  100-129  mg/dL   Near or Above                    Optimal  130-159  mg/dL   Borderline  160-189  mg/dL   High  >190     mg/dL   Very High  Glucose, capillary     Status: Abnormal   Collection Time: 05/22/15  6:03 AM  Result Value Ref Range   Glucose-Capillary 58 (L) 65 - 99 mg/dL  Glucose, capillary     Status: None   Collection Time: 05/22/15  6:41 AM  Result Value Ref Range   Glucose-Capillary 85 65 - 99 mg/dL  Glucose, capillary     Status: None   Collection Time: 05/22/15  8:45 AM  Result Value Ref Range   Glucose-Capillary 94 65 - 99 mg/dL  Troponin I (q 6hr x 3)     Status: Abnormal   Collection Time: 05/22/15 11:00 AM  Result Value Ref Range   Troponin I 0.04 (H) <0.031 ng/mL    Comment:        PERSISTENTLY INCREASED TROPONIN VALUES IN THE RANGE OF 0.04-0.49 ng/mL CAN BE SEEN IN:       -UNSTABLE ANGINA       -CONGESTIVE HEART FAILURE       -MYOCARDITIS       -CHEST TRAUMA       -ARRYHTHMIAS       -LATE PRESENTING MYOCARDIAL INFARCTION       -COPD   CLINICAL FOLLOW-UP RECOMMENDED.     Dg Chest Port 1 View  05/21/2015   CLINICAL DATA:  Acute onset of cough.  Initial encounter.  EXAM: PORTABLE CHEST 1 VIEW  COMPARISON:  Chest radiograph performed earlier today at 7:37 p.m.  FINDINGS: Increased interstitial markings appear to be chronic in nature, with underlying scarring and emphysematous change. No pleural effusion or pneumothorax is  seen.  The cardiomediastinal silhouette is normal in size. No acute osseous abnormalities are identified.  IMPRESSION: Chronic lung changes noted, with scarring and emphysematous change. No definite acute airspace consolidation seen.   Electronically Signed   By: Garald Balding M.D.   On: 05/21/2015 22:49   Dg Abd Acute W/chest  05/21/2015   CLINICAL DATA:  Nausea and vomiting and dizziness.  EXAM: DG ABDOMEN ACUTE W/ 1V CHEST  COMPARISON:  05/17/2015, 05/03/2015  FINDINGS: Hyperinflation evident with mild interstitial prominence, suspect background COPD/emphysema. Normal heart size and vascularity. Bibasilar atelectasis versus scarring as before. No new superimposed pneumonia, collapse or consolidation. No effusion or pneumothorax. No significant edema pattern. Stable mild left hilar prominence, suspect vascular markings. Atherosclerosis of the aorta. Bones are osteopenic. Degenerative changes of the spine.  Large amount retained barium throughout the entire colon. No obstruction pattern. No significant ileus. Aortoiliac extensive atherosclerosis. No free air on the decubitus view.  IMPRESSION: Stable chronic COPD/ emphysema and parenchymal scarring.  Large volume of retained barium throughout the colon. Negative for obstruction or free air.  Aortoiliac atherosclerosis  Osteopenia and degenerative changes of the spine.   Electronically Signed   By: Jerilynn Mages.  Shick M.D.   On: 05/21/2015 20:03               Blood pressure 118/58, pulse 67, temperature 97.5 F (36.4 C), temperature source Oral, resp. rate 17, height 5' 10"  (1.778 m), weight 46.72 kg (103 lb), SpO2 100 %.  Physical exam:   General--thin frail appearing white male with nasal cannula in place  ENT--nonicteric   Heart--diminished heart sounds  Lungs--diminished lung sounds  Abdomen--nondistended soft and essentially nontender with good bowel sounds  Psych--alert oriented and appears to be appropriate   Assessment: 1. Nausea  vomiting/weight loss. The patient has had a partial gastrectomy with Billroth II. The upper G.I. did not clearly shown obstruction but he  is at risk for gastric neoplasia this far out after partial gastrectomy. I agree EGD would be appropriate 2. COPD. 02 dependent 3. CHD 4. Peripheral and cerebrovascular disease 5. CKD stage III  Plan: when will plan EGD tomorrow. Have discussed this with the patient and we will plan on using propofol sedation.   Colbi Schiltz JR,Antonina Deziel L 05/22/2015, 3:13 PM   Pager: [phone] If no answer or after hours call [phone]

## 2015-05-23 NOTE — Op Note (Signed)
[facility] [address] [place], [address]   ENDOSCOPY PROCEDURE REPORT  PATIENT: Alec Walker, Alec Walker  MR#: [id number] BIRTHDATE: December 13, 1926 , 87  yrs. old GENDER: male ENDOSCOPIST:Clarise Chacko, MD REFERRED BY: [facility] PROCEDURE DATE:  05/23/2015 PROCEDURE:   EGD and biopsy ASA CLASS:    class IV INDICATIONS: nausea and vomiting and gentlemen who has had previous subtotal gastrectomy with billroth 2 anastomosis MEDICATION: propofol 50 mg TOPICAL ANESTHETIC:   Cetacaine Spray  DESCRIPTION OF PROCEDURE:   After the risks and benefits of the procedure were explained, informed consent was obtained.  The Pentax Gastroscope [id number]  endoscope was introduced through the mouth  and advanced to the second portion of the duodenum .  The instrument was slowly withdrawn as the mucosa was fully examined. Estimated blood loss is zero unless otherwise noted in this procedure report. We did reach the anastomosis and it was severely ulcerated on the small bowel side as well as gastric side. There was a large amount of food material in the stomach despite overnight fast. Both the afferent and the efferent limb limbs were entered. Multiple biopsies were taken of the ulcerated area. The scope was drawn and there was severe ulcerative esophagitis. Cytology brushing was obtained for fungal smear. The patient tolerated the procedure well.    The scope was then withdrawn from the patient and the procedure completed.  COMPLICATIONS: There were no immediate complications.  ENDOSCOPIC IMPRESSION: 1. Severe Ulceration at gastrojejunostomy. Multiple biopsies taken 2. Ulcerative Esophagitis. Brushing for fungal smear obtain but suspect this is due to chronic vomiting. RECOMMENDATIONS: we will add Carafate liquid to his medical regimen and wait for biopsies.   _______________________________ Rosalie DoctorCarman Ching, MD 05/23/2015 12:35 PM     cc: Dr.  Cyndia Bent  CPT CODES: ICD CODES:  The ICD and CPT codes recommended by this software are interpretations from the data that the clinical staff has captured with the software.  The verification of the translation of this report to the ICD and CPT codes and modifiers is the sole responsibility of the health care institution and practicing physician where this report was generated.  [employer] will not be held responsible for the validity of the ICD and CPT codes included on this report.  AMA assumes no liability for data contained or not contained herein. CPT is a Publishing rights manager of the [employer].  PATIENT NAME:  Alec Walker, Alec Walker MR#: [id number]

## 2015-05-23 NOTE — Transfer of Care (Signed)
Immediate Anesthesia Transfer of Care Note  Patient: Alec Walker  Procedure(s) Performed: Procedure(s): ESOPHAGOGASTRODUODENOSCOPY (EGD) (N/A)  Patient Location: Endoscopy Unit  Anesthesia Type:MAC  Level of Consciousness: awake and alert   Airway & Oxygen Therapy: Patient connected to face mask oxygen  Post-op Assessment: Report given to RN  Post vital signs: stable  Last Vitals:  Filed Vitals:   05/23/15 1006  BP: 151/54  Pulse:   Temp: 36.8 C  Resp: 28    Complications: No apparent anesthesia complications

## 2015-05-23 NOTE — Progress Notes (Signed)
[facility] PROGRESS NOTE  KEBRON PULSE ZOX:[id number] DOB: [birth year] DOA: 05/21/2015  PCP: Eartha Inch, MD  Brief HPI: 81 year old male with a history of hypertension, hyperlipidemia, chronic respiratory failure on 3 L at home secondary to COPD, CAD, CKD stage III, and peptic ulcer disease resulting in a Billroth II bypass presents with 4 day history of intractable nausea and vomiting. The patient states that he has had recurrent vomiting on and off for the past 6 months and has lost approximately 13 pounds in the past month. He complains of epigastric and periumbilical pain. Notably, the patient had an upper GI study performed on 05/17/2015 which was negative for any mass or stricture. The patient has been taking diclofenac twice a day for arthritis for many months. He denied any other NSAID use. He has a remote history of a Billroth II bypass secondary to peptic ulcer disease. He denied any hematemesis or coffee grounds emesis. Fecal occult blood test was negative in the emergency department.  Past medical history:  Past Medical History  Diagnosis Date  . COPD (chronic obstructive pulmonary disease) (HCC)   . CAD (coronary artery disease)     a.  nonobstructive CAD by cath 1999. b. Myoview 04/2012: fixed inferior and inferoseptal bowel attenuation artifact, no reversible ischemia. EF 62%. Low risk scan.  Marland Kitchen HTN (hypertension)   . PUD (peptic ulcer disease)     a. s/p surgery 1976.  Marland Kitchen Syncope     2012  . Carotid artery disease (HCC) 03/22/13    a. 50-69% BICA by duplex 03/2013, repeat needed 11/2013.  Marland Kitchen PAD (peripheral artery disease) (HCC)     a. prior stenting of RCIA 1999. b. history of 90% vertebral artery narrowing, 40% subclavian artery narrowing. c. Diagnostic PV angio 08/2013 for progressive claudication - will ultimately need endarterectomy/patch angio on bilat CFA stenosis; will also need atherectomy/PT/stenting to REIA.   . CKD (chronic kidney disease), stage III     . HLD (hyperlipidemia)   . Chronic respiratory failure (HCC)     a. On home O2.  Marland Kitchen NSVT (nonsustained ventricular tachycardia) (HCC)     a. Brief NSVT (6b) during 08/2013 adm.  . Stroke (HCC)   . Shortness of breath   . On supplemental oxygen therapy     2l    Consultants: [facility]  Procedures:  EGD 10/5 ENDOSCOPIC IMPRESSION: 1. Severe Ulceration at gastrojejunostomy. Multiple biopsies taken 2. Ulcerative Esophagitis. Brushing for fungal smear obtain but suspect this is due to chronic vomiting.  Antibiotics: None  Subjective: Patient seen after his upper endoscopy. He feels well. No nausea, vomiting. No abdominal pain.  Objective: Vital Signs  Filed Vitals:   05/23/15 1255 05/23/15 1300 05/23/15 1305 05/23/15 1319  BP: 93/29 91/39 104/53 111/52  Pulse: 75 72 60 69  Temp:    97.2 F (36.2 C)  TempSrc:    Oral  Resp: 15 22 25 20   Height:      Weight:      SpO2: 95% 95% 95% 96%    Intake/Output Summary (Last 24 hours) at 05/23/15 1456 Last data filed at 05/23/15 1215  Gross per 24 hour  Intake 2018.75 ml  Output    525 ml  Net 1493.75 ml   Filed Weights   05/21/15 2221 05/22/15 0604 05/23/15 0435  Weight: 45.723 kg (100 lb 12.8 oz) 46.72 kg (103 lb) 46.766 kg (103 lb 1.6 oz)    General appearance: alert, cooperative, appears stated age and no distress  Head: Normocephalic, without obvious abnormality, atraumatic Resp: clear to auscultation bilaterally Cardio: regular rate and rhythm, S1, S2 normal, no murmur, click, rub or gallop GI: soft, non-tender; bowel sounds normal; no masses,  no organomegaly Extremities: extremities normal, atraumatic, no cyanosis or edema Neurologic: alert and oriented 3. No focal neurological deficits are noted.  Lab Results:  Basic Metabolic Panel:  Recent Labs Lab 05/21/15 1702 05/22/15 0412 05/23/15 0322  NA 139 144 139  K 3.3* 3.5 3.8  CL 109 114* 110  CO2 GLUCOSE 99 67 106*  BUN CREATININE 1.30* 1.06 0.98  CALCIUM 8.0* 7.8* 7.8*   Liver Function Tests:  Recent Labs Lab 05/21/15 1702  AST 9*  ALT 8*  ALKPHOS 75  BILITOT 0.3  PROT 4.4*  ALBUMIN 1.7*    Recent Labs Lab 05/21/15 2247  LIPASE 22   CBC:  Recent Labs Lab 05/21/15 1702 05/22/15 0412  WBC 7.6 5.2  NEUTROABS 5.9  --   HGB 8.3* 8.0*  HCT 27.7* 26.1*  MCV 90.5 90.6  PLT 315 263   Cardiac Enzymes:  Recent Labs Lab 05/21/15 1702 05/21/15 2247 05/22/15 0412 05/22/15 1100  TROPONINI 0.04* 0.03 0.04* 0.04*   BNP (last 3 results)  Recent Labs  01/25/15 1707 05/22/15 0412  BNP 214.1* 286.4*    CBG:  Recent Labs Lab 05/22/15 0603 05/22/15 0641 05/22/15 0845 05/23/15 0553  GLUCAP 58* 85 94 85    Recent Results (from the past 240 hour(s))  MRSA PCR Screening     Status: Abnormal   Collection Time: 05/21/15 10:16 PM  Result Value Ref Range Status   MRSA by PCR POSITIVE (A) NEGATIVE Final    Comment:        The GeneXpert MRSA Assay (FDA approved for NASAL specimens only), is one component of a comprehensive MRSA colonization surveillance program. It is not intended to diagnose MRSA infection nor to guide or monitor treatment for MRSA infections. RESULT CALLED TO, READ BACK BY AND VERIFIED WITH: [email or web address]  05/21/15 MKELLY       Studies/Results: Dg Chest Port 1 View  05/21/2015   CLINICAL DATA:  Acute onset of cough.  Initial encounter.  EXAM: PORTABLE CHEST 1 VIEW  COMPARISON:  Chest radiograph performed earlier today at 7:37 p.m.  FINDINGS: Increased interstitial markings appear to be chronic in nature, with underlying scarring and emphysematous change. No pleural effusion or pneumothorax is seen.  The cardiomediastinal silhouette is normal in size. No acute osseous abnormalities are identified.  IMPRESSION: Chronic lung changes noted, with scarring and emphysematous change. No definite acute airspace consolidation seen.   Electronically Signed   By: Roanna Raider M.D.   On: 05/21/2015 22:49   Dg Abd Acute W/chest  05/21/2015   CLINICAL DATA:  Nausea and vomiting and dizziness.  EXAM: DG ABDOMEN ACUTE W/ 1V CHEST  COMPARISON:  05/17/2015, 05/03/2015  FINDINGS: Hyperinflation evident with mild interstitial prominence, suspect background COPD/emphysema. Normal heart size and vascularity. Bibasilar atelectasis versus scarring as before. No new superimposed pneumonia, collapse or consolidation. No effusion or pneumothorax. No significant edema pattern. Stable mild left hilar prominence, suspect vascular markings. Atherosclerosis of the aorta. Bones are osteopenic. Degenerative changes of the spine.  Large amount retained barium throughout the entire colon. No obstruction pattern. No significant ileus. Aortoiliac extensive atherosclerosis. No free air on the decubitus view.  IMPRESSION: Stable chronic COPD/ emphysema and parenchymal scarring.  Large volume of retained barium throughout the colon.  Negative for obstruction or free air.  Aortoiliac atherosclerosis  Osteopenia and degenerative changes of the spine.   Electronically Signed   By: Judie Petit.  Shick M.D.   On: 05/21/2015 20:03    Medications:  Scheduled: . aspirin EC  81 mg Oral QHS  . atorvastatin  40 mg Oral q1800  . budesonide-formoterol  2 puff Inhalation BID  . Chlorhexidine Gluconate Cloth  6 each Topical Q0600  . cholecalciferol  2,000 Units Oral QHS  . feeding supplement  1 Container Oral BID PC  . guaiFENesin  600 mg Oral BID  . heparin  5,000 Units Subcutaneous 3 times per day  . metoCLOPramide (REGLAN) injection  10 mg Intravenous Q12H  . metoprolol tartrate  25 mg Oral BID  . mirtazapine  30 mg Oral QHS  . mupirocin ointment  1 application Nasal BID  . ondansetron (ZOFRAN) IV  4 mg Intravenous Once  . pantoprazole (PROTONIX) IV  40 mg Intravenous Q24H  . sucralfate  1 g Oral TID WC & HS  . temazepam  30 mg Oral QHS  . tiotropium  18 mcg Inhalation Daily  . vitamin B-12  1,000 mcg Oral  Daily   Continuous: . sodium chloride 0.9 % 1,000 mL with potassium chloride 20 mEq infusion 75 mL/hr at 05/23/15 0159   ZOX:[id number] **OR** acetaminophen, albuterol, morphine injection, nitroGLYCERIN, ondansetron, simethicone  Assessment/Plan:  Principal Problem:   Nausea & vomiting Active Problems:   COPD (chronic obstructive pulmonary disease) (HCC)   CAD (coronary artery disease)   Elevated troponin   HTN (hypertension)   Weakness generalized   PVD (peripheral vascular disease) (HCC)   Hyperlipidemia with target LDL less than 70   Carotid artery disease (HCC)   CKD (chronic kidney disease), stage III   Chest pain   Protein-calorie malnutrition, severe (HCC)   Failure to thrive in adult   Regurgitation   Chronic respiratory failure with hypoxia (HCC)    Ulcerative esophagitis and Ulceration at gastrojejunostomy Nausea and vomiting seems to have resolved. Patient was seen by gastroenterology and underwent EGD. Ulcerative esophagitis noted. Ulceration also seen at gastrojejunostomy. Patient already on PPI. Carafate is also been initiated. Clear liquid diet initiated. Reduce rate of IV fluids.  Epigastric/periumbilical pain Likely secondary to changes noted on EGD. Patient is currently asymptomatic. Diet to be advanced.  CKD stage III Baseline creatinine 1.2-1.5. Renal function is stable.  COPD/chronic respiratory failure with hypoxia Continue Spiriva and Symbicort. Patient is maintained on 3 L nasal cannula at home. Stable presently without any increased work of breathing  Elevated troponins No symptoms of angina. EKG unchanged from previous comparisons. Likely demand ischemia in the setting of volume depletion and CKD. Do not anticipate any further workup.  Severe protein calorie malnutrition Continue nutritional supplementation  History of stroke Continue aspirin and statin for secondary prophylaxis  Essential Hypertension Continue metoprolol tartrate 25 mg  twice a day  Normocytic anemia Hemoglobin is stable. No overt bleeding.  DVT Prophylaxis: subcutaneous heparin. Change to SCDs  Code Status: DO NOT RESUSCITATE  Family Communication: discussed with the patient  Disposition Plan: start mobilizing     LOS: 2 days   [facility]  [facility] Pager [phone] 05/23/2015, 2:56 PM  If 7PM-7AM, please contact night-coverage at [email or web address], password [facility]

## 2015-05-23 NOTE — Interval H&P Note (Signed)
History and Physical Interval Note:  05/23/2015 10:23 AM  Alec Walker  has presented today for surgery, with the diagnosis of N+V  The various methods of treatment have been discussed with the patient and family. After consideration of risks, benefits and other options for treatment, the patient has consented to  Procedure(s): ESOPHAGOGASTRODUODENOSCOPY (EGD) (N/A) as a surgical intervention .  The patient's history has been reviewed, patient examined, no change in status, stable for surgery.  I have reviewed the patient's chart and labs.  Questions were answered to the patient's satisfaction.     Tifani Dack JR,Luverne Zerkle L

## 2015-05-24 ENCOUNTER — Encounter (HOSPITAL_COMMUNITY): Payer: Self-pay | Admitting: Gastroenterology

## 2015-05-24 DIAGNOSIS — I2510 Atherosclerotic heart disease of native coronary artery without angina pectoris: Secondary | ICD-10-CM

## 2015-05-24 LAB — BASIC METABOLIC PANEL
Anion gap: 8 (ref 5–15)
BUN: 8 mg/dL (ref 6–20)
CALCIUM: 7.9 mg/dL — AB (ref 8.9–10.3)
CHLORIDE: 112 mmol/L — AB (ref 101–111)
CO2: 21 mmol/L — AB (ref 22–32)
CREATININE: 0.86 mg/dL (ref 0.61–1.24)
GFR calc non Af Amer: >60 mL/min (ref >60)
GLUCOSE: 94 mg/dL (ref 65–99)
Potassium: 3.5 mmol/L (ref 3.5–5.1)
Sodium: 141 mmol/L (ref 135–145)

## 2015-05-24 LAB — CBC
HEMATOCRIT: 29.1 % — AB (ref 39.0–52.0)
HEMOGLOBIN: 9 g/dL — AB (ref 13.0–17.0)
MCH: 27.9 pg (ref 26.0–34.0)
MCHC: 30.9 g/dL (ref 30.0–36.0)
MCV: 90.1 fL (ref 78.0–100.0)
Platelets: 298 10*3/uL (ref 150–400)
RBC: 3.23 MIL/uL — ABNORMAL LOW (ref 4.22–5.81)
RDW: 14.9 % (ref 11.5–15.5)
WBC: 10.1 10*3/uL (ref 4.0–10.5)

## 2015-05-24 LAB — GLUCOSE, CAPILLARY: Glucose-Capillary: 88 mg/dL (ref 65–99)

## 2015-05-24 MED ORDER — MIRTAZAPINE 15 MG PO TABS
30.0000 mg | ORAL_TABLET | Freq: Every day | ORAL | Status: DC
  Administered 2015-05-24: 30 mg via ORAL
  Filled 2015-05-24: qty 2

## 2015-05-24 MED ORDER — METOCLOPRAMIDE HCL 10 MG PO TABS
10.0000 mg | ORAL_TABLET | Freq: Two times a day (BID) | ORAL | Status: DC
  Administered 2015-05-24 – 2015-05-25 (×3): 10 mg via ORAL
  Filled 2015-05-24 (×3): qty 1

## 2015-05-24 MED ORDER — PANTOPRAZOLE SODIUM 40 MG PO TBEC
40.0000 mg | DELAYED_RELEASE_TABLET | Freq: Every day | ORAL | Status: DC
  Administered 2015-05-24: 40 mg via ORAL
  Filled 2015-05-24: qty 1

## 2015-05-24 MED ORDER — MIRTAZAPINE 15 MG PO TABS: 15.0000 mg | ORAL_TABLET | Freq: Every day | ORAL | Status: DC

## 2015-05-24 MED ORDER — TEMAZEPAM 15 MG PO CAPS
15.0000 mg | ORAL_CAPSULE | Freq: Every day | ORAL | Status: DC
  Administered 2015-05-24: 15 mg via ORAL
  Filled 2015-05-24: qty 1

## 2015-05-24 NOTE — Progress Notes (Signed)
Occupational Therapy Treatment Patient Details Name: Alec Walker MRN: [id number] DOB: 01-17-1927 Today's Date: 05/24/2015    History of present illness 81 y.o. male with PMH of hypertension, hyperlipidemia, GERD, CAD, S/P stent placement, COPD on 3 L oxygen at home, PUD, PAD, CKD-III, stroke, anemia, diastolic congestive heart failure, s/p of billroth II procedure, who presents with nausea, vomiting, regurgitation weight loss   OT comments  Pt making progress with functional goals. Pt able to ambulate to bathroom with RW at min guard A level, transfer to toilet and toileting with min guard A.pt stood at sink to wash and dry hands with min guard A. Pt's HR 77 and O2 100% during activity and at end of session  Follow Up Recommendations  SNF    Equipment Recommendations  Other (comment) (TBD)    Recommendations for Other Services      Precautions / Restrictions Precautions Precautions: Fall Restrictions Weight Bearing Restrictions: No       Mobility Bed Mobility Overal bed mobility: Modified Independent             General bed mobility comments: pt up in recliner  Transfers Overall transfer level: Needs assistance Equipment used: 1 person hand held assist;Rolling walker (2 wheeled) Transfers: Sit to/from Stand Sit to Stand: Min guard         General transfer comment: cues for hand placement    Balance Overall balance assessment: Needs assistance Sitting-balance support: No upper extremity supported;Feet supported Sitting balance-Leahy Scale: Good     Standing balance support: During functional activity Standing balance-Leahy Scale: Fair                     ADL Overall ADL's : Needs assistance/impaired     Grooming: Wash/dry hands;Standing;Min guard           Upper Body Dressing : Min guard;Standing       Toilet Transfer: Ambulation;RW;Min guard;Grab bars;Cueing for safety;Cueing for sequencing   Toileting- Clothing Manipulation and  Hygiene: Sit to/from stand;Min guard   Tub/ Engineer, structural: Min guard;Ambulation;Grab bars   Functional mobility during ADLs: Min guard;Cueing for sequencing;Cueing for safety        Vision  no change from baseline                              Cognition   Behavior During Therapy: [facility] for tasks assessed/performed Overall Cognitive Status: No family/caregiver present to determine baseline cognitive functioning Area of Impairment: Problem solving Orientation Level: Disoriented to;Time   Memory: Decreased short-term memory               Extremity/Trunk Assessment   generalized weakness                       General Comments  pt pleasant and cooperative    Pertinent Vitals/ Pain       Pain Assessment: No/denies pain  Home Living  lives at home with wife                                        Prior Functioning/Environment  independent with ADLs, caregiver for his wife           Frequency Min 2X/week     Progress Toward Goals  OT Goals(current goals can now be found in the care plan  section)  Progress towards OT goals: Progressing toward goals     Plan Discharge plan remains appropriate    End of Session Equipment Utilized During Treatment: Gait belt;Oxygen;Rolling walker   Activity Tolerance Patient tolerated treatment well   Patient Left in chair;with call bell/phone within reach;with chair alarm set             Time: 1204-1222 OT Time Calculation (min): 18 min  Charges:    Galen Manila 05/24/2015, 1:30 PM

## 2015-05-24 NOTE — Progress Notes (Signed)
Physical Therapy Treatment Patient Details Name: Alec Walker MRN: [id number] DOB: 1927-01-18 Today's Date: 05/24/2015    History of Present Illness 81 y.o. male with PMH of hypertension, hyperlipidemia, GERD, CAD, S/P stent placement, COPD on 3 L oxygen at home, PUD, PAD, CKD-III, stroke, anemia, diastolic congestive heart failure, s/p of billroth II procedure, who presents with nausea, vomiting, regurgitation weight loss    PT Comments    Pt with improved cognition, mobility and function today. Increased gait distance and bil LE HEP performed today. Pt continues to state he does not feel he can care for himself at home yet. Encouraged increased HEP and gait with nursing staff. Sats remained 97% throughout on 3L with HR 78.   Follow Up Recommendations  SNF;Supervision for mobility/OOB     Equipment Recommendations       Recommendations for Other Services       Precautions / Restrictions Precautions Precautions: Fall Restrictions Weight Bearing Restrictions: No    Mobility  Bed Mobility Overal bed mobility: Modified Independent             General bed mobility comments: increased time with use of rail   Transfers       Sit to Stand: Min guard         General transfer comment: cues for hand placement  Ambulation/Gait Ambulation/Gait assistance: Min guard Ambulation Distance (Feet): 210 Feet Assistive device: Rolling walker (2 wheeled) Gait Pattern/deviations: Step-through pattern;Decreased stride length;Trunk flexed   Gait velocity interpretation: Below normal speed for age/gender General Gait Details: cues for posture, position in RW and cues x 4 to direct RW as pt running into obstacles x 3   Stairs            Wheelchair Mobility    Modified Rankin (Stroke Patients Only)       Balance Overall balance assessment: Needs assistance   Sitting balance-Leahy Scale: Good       Standing balance-Leahy Scale: Fair                       Cognition Arousal/Alertness: Awake/alert Behavior During Therapy: Flat affect Overall Cognitive Status: Impaired/Different from baseline Area of Impairment: Orientation Orientation Level: Disoriented to;Time   Memory: Decreased short-term memory              Exercises General Exercises - Lower Extremity Ankle Circles/Pumps: AROM;Seated;Both;20 reps Long Arc Quad: AROM;Seated;Both;20 reps Hip ABduction/ADduction: AROM;Seated;Both;20 reps Hip Flexion/Marching: AROM;Seated;Both;20 reps    General Comments        Pertinent Vitals/Pain Pain Assessment: No/denies pain    Home Living                      Prior Function            PT Goals (current goals can now be found in the care plan section) Progress towards PT goals: Progressing toward goals    Frequency       PT Plan Current plan remains appropriate    Co-evaluation             End of Session Equipment Utilized During Treatment: Oxygen Activity Tolerance: Patient tolerated treatment well Patient left: in chair;with call bell/phone within reach;with chair alarm set     Time: 1100-1121 PT Time Calculation (min) (ACUTE ONLY): 21 min  Charges:  $Gait Training: 8-22 mins                    G Codes:  Toney Sang Beth 05/24/2015, 11:55 AM Delaney Meigs, PT [phone]

## 2015-05-24 NOTE — Progress Notes (Signed)
[facility] PROGRESS NOTE  Alec Walker YNW:[id number] DOB: Jan 03, 1927 DOA: 05/21/2015  PCP: Eartha Inch, MD  Brief HPI: 81 year old male with a history of hypertension, hyperlipidemia, chronic respiratory failure on 3 L at home secondary to COPD, CAD, CKD stage III, and peptic ulcer disease resulting in a Billroth II bypass presents with 4 day history of intractable nausea and vomiting. The patient states that he has had recurrent vomiting on and off for the past 6 months and has lost approximately 13 pounds in the past month. He complains of epigastric and periumbilical pain. Notably, the patient had an upper GI study performed on 05/17/2015 which was negative for any mass or stricture. The patient has been taking diclofenac twice a day for arthritis for many months. He denied any other NSAID use. He has a remote history of a Billroth II bypass secondary to peptic ulcer disease. He denied any hematemesis or coffee grounds emesis. Fecal occult blood test was negative in the emergency department.  Past medical history:  Past Medical History  Diagnosis Date  . COPD (chronic obstructive pulmonary disease) (HCC)   . CAD (coronary artery disease)     a.  nonobstructive CAD by cath 1999. b. Myoview 04/2012: fixed inferior and inferoseptal bowel attenuation artifact, no reversible ischemia. EF 62%. Low risk scan.  Marland Kitchen HTN (hypertension)   . PUD (peptic ulcer disease)     a. s/p surgery 1976.  Marland Kitchen Syncope     2012  . Carotid artery disease (HCC) 03/22/13    a. 50-69% BICA by duplex 03/2013, repeat needed 11/2013.  Marland Kitchen PAD (peripheral artery disease) (HCC)     a. prior stenting of RCIA 1999. b. history of 90% vertebral artery narrowing, 40% subclavian artery narrowing. c. Diagnostic PV angio 08/2013 for progressive claudication - will ultimately need endarterectomy/patch angio on bilat CFA stenosis; will also need atherectomy/PT/stenting to REIA.   . CKD (chronic kidney disease), stage III     . HLD (hyperlipidemia)   . Chronic respiratory failure (HCC)     a. On home O2.  Marland Kitchen NSVT (nonsustained ventricular tachycardia) (HCC)     a. Brief NSVT (6b) during 08/2013 adm.  . Stroke (HCC)   . Shortness of breath   . On supplemental oxygen therapy     2l    Consultants: [facility]  Procedures:  EGD 10/5 ENDOSCOPIC IMPRESSION: 1. Severe Ulceration at gastrojejunostomy. Multiple biopsies taken 2. Ulcerative Esophagitis. Brushing for fungal smear obtain but suspect this is due to chronic vomiting.  Antibiotics: None  Subjective: Patient denies any complaints. No nausea, vomiting. Tolerated his clear liquids.   Objective: Vital Signs  Filed Vitals:   05/23/15 1305 05/23/15 1319 05/23/15 2100 05/24/15 0622  BP: 104/53 111/52 125/54 115/71  Pulse: 60 69 73 90  Temp:  97.2 F (36.2 C) 98.1 F (36.7 C) 98 F (36.7 C)  TempSrc:  Oral Oral Oral  Resp: Height:      Weight:    49.125 kg (108 lb 4.8 oz)  SpO2: 95% 96% 96% 97%    Intake/Output Summary (Last 24 hours) at 05/24/15 0726 Last data filed at 05/24/15 0622  Gross per 24 hour  Intake    520 ml  Output    751 ml  Net   -231 ml   Filed Weights   05/22/15 0604 05/23/15 0435 05/24/15 0622  Weight: 46.72 kg (103 lb) 46.766 kg (103 lb 1.6 oz) 49.125 kg (108 lb 4.8 oz)  General appearance: alert, cooperative, appears stated age and no distress Resp: clear to auscultation bilaterally Cardio: regular rate and rhythm, S1, S2 normal, systolic murmur at aortic area, click, rub or gallop GI: soft, non-tender; bowel sounds normal; no masses,  no organomegaly Extremities: extremities normal, atraumatic, no cyanosis or edema Neurologic: alert and oriented 3. No focal neurological deficits are noted.  Lab Results:  Basic Metabolic Panel:  Recent Labs Lab 05/21/15 1702 05/22/15 0412 05/23/15 0322 05/24/15 0435  NA 139 144 139 141  K 3.3* 3.5 3.8 3.5  CL 109 114* 110 112*  CO2 21*  GLUCOSE 99 67 106* 94  BUN CREATININE 1.30* 1.06 0.98 0.86  CALCIUM 8.0* 7.8* 7.8* 7.9*   Liver Function Tests:  Recent Labs Lab 05/21/15 1702  AST 9*  ALT 8*  ALKPHOS 75  BILITOT 0.3  PROT 4.4*  ALBUMIN 1.7*    Recent Labs Lab 05/21/15 2247  LIPASE 22   CBC:  Recent Labs Lab 05/21/15 1702 05/22/15 0412 05/24/15 0435  WBC 7.6 5.2 10.1  NEUTROABS 5.9  --   --   HGB 8.3* 8.0* 9.0*  HCT 27.7* 26.1* 29.1*  MCV 90.5 90.6 90.1  PLT 315 263 298   Cardiac Enzymes:  Recent Labs Lab 05/21/15 1702 05/21/15 2247 05/22/15 0412 05/22/15 1100  TROPONINI 0.04* 0.03 0.04* 0.04*   BNP (last 3 results)  Recent Labs  01/25/15 1707 05/22/15 0412  BNP 214.1* 286.4*    CBG:  Recent Labs Lab 05/22/15 0603 05/22/15 0641 05/22/15 0845 05/23/15 0553 05/24/15 0705  GLUCAP 58* 85 94 85 88    Recent Results (from the past 240 hour(s))  MRSA PCR Screening     Status: Abnormal   Collection Time: 05/21/15 10:16 PM  Result Value Ref Range Status   MRSA by PCR POSITIVE (A) NEGATIVE Final    Comment:        The GeneXpert MRSA Assay (FDA approved for NASAL specimens only), is one component of a comprehensive MRSA colonization surveillance program. It is not intended to diagnose MRSA infection nor to guide or monitor treatment for MRSA infections. RESULT CALLED TO, READ BACK BY AND VERIFIED WITH: [email or web address]  05/21/15 MKELLY       Studies/Results: No results found.  Medications:  Scheduled: . aspirin EC  81 mg Oral QHS  . atorvastatin  40 mg Oral q1800  . budesonide-formoterol  2 puff Inhalation BID  . Chlorhexidine Gluconate Cloth  6 each Topical Q0600  . cholecalciferol  2,000 Units Oral QHS  . feeding supplement  1 Container Oral BID PC  . guaiFENesin  600 mg Oral BID  . metoCLOPramide (REGLAN) injection  10 mg Intravenous Q12H  . metoprolol tartrate  25 mg Oral BID  . mirtazapine  30 mg Oral QHS  . mupirocin ointment  1  application Nasal BID  . ondansetron (ZOFRAN) IV  4 mg Intravenous Once  . pantoprazole (PROTONIX) IV  40 mg Intravenous Q24H  . sucralfate  1 g Oral TID WC & HS  . temazepam  30 mg Oral QHS  . tiotropium  18 mcg Inhalation Daily  . vitamin B-12  1,000 mcg Oral Daily   Continuous: . sodium chloride 0.9 % 1,000 mL with potassium chloride 20 mEq infusion 50 mL/hr at 05/23/15 1641   ZOX:[id number] **OR** acetaminophen, albuterol, morphine injection, nitroGLYCERIN, ondansetron, simethicone  Assessment/Plan:  Principal Problem:   Nausea & vomiting Active Problems:   COPD (chronic obstructive  pulmonary disease) (HCC)   CAD (coronary artery disease)   Elevated troponin   HTN (hypertension)   Weakness generalized   PVD (peripheral vascular disease) (HCC)   Hyperlipidemia with target LDL less than 70   Carotid artery disease (HCC)   CKD (chronic kidney disease), stage III   Chest pain   Protein-calorie malnutrition, severe (HCC)   Failure to thrive in adult   Regurgitation   Chronic respiratory failure with hypoxia (HCC)    Ulcerative esophagitis and Ulceration at gastrojejunostomy Nausea and vomiting seems to have resolved. Patient was seen by gastroenterology and underwent EGD. Ulcerative esophagitis noted. Ulceration also seen at gastrojejunostomy. Patient already on PPI and Carafate. Patient is tolerating clear liquid diet. Await GI input. Diet could be advanced today.  Epigastric/periumbilical pain Likely secondary to changes noted on EGD. Patient is currently asymptomatic.   CKD stage III Baseline creatinine 1.2-1.5. Renal function is stable.  COPD/chronic respiratory failure with hypoxia Continue Spiriva and Symbicort. Patient is maintained on 3 L nasal cannula at home. Stable presently without any increased work of breathing  Elevated troponins No symptoms of angina. EKG unchanged from previous comparisons. Likely demand ischemia in the setting of volume depletion  and CKD. Do not anticipate any further workup.  Severe protein calorie malnutrition Continue nutritional supplementation  History of stroke Continue aspirin and statin for secondary prophylaxis  Essential Hypertension Continue metoprolol tartrate 25 mg twice a day  Normocytic anemia Hemoglobin is stable. No overt bleeding.  DVT Prophylaxis: subcutaneous heparin. Change to SCDs  Code Status: DO NOT RESUSCITATE  Family Communication: discussed with the patient  Disposition Plan: PT and OT is recommending SNF. Social worker on board. Anticipate discharge tomorrow.     LOS: 3 days   [facility]  [facility] Pager [phone] 05/24/2015, 7:26 AM  If 7PM-7AM, please contact night-coverage at [email or web address], password [facility]

## 2015-05-24 NOTE — Plan of Care (Signed)
Problem: Phase I Progression Outcomes Goal: OOB as tolerated unless otherwise ordered Outcome: Progressing Walked down hallway with PT

## 2015-05-25 LAB — GLUCOSE, CAPILLARY
GLUCOSE-CAPILLARY: 73 mg/dL (ref 65–99)
GLUCOSE-CAPILLARY: 85 mg/dL (ref 65–99)

## 2015-05-25 MED ORDER — TEMAZEPAM 7.5 MG PO CAPS: 7.5000 mg | ORAL_CAPSULE | Freq: Every evening | ORAL | Status: AC | PRN

## 2015-05-25 MED ORDER — GUAIFENESIN ER 600 MG PO TB12: 600.0000 mg | ORAL_TABLET | Freq: Two times a day (BID) | ORAL | Status: AC | PRN

## 2015-05-25 MED ORDER — ACETAMINOPHEN 325 MG PO TABS: 650.0000 mg | ORAL_TABLET | Freq: Four times a day (QID) | ORAL | Status: DC | PRN

## 2015-05-25 MED ORDER — ATORVASTATIN CALCIUM 40 MG PO TABS: 40.0000 mg | ORAL_TABLET | Freq: Every day | ORAL | Status: AC

## 2015-05-25 MED ORDER — SUCRALFATE 1 GM/10ML PO SUSP: 1.0000 g | Freq: Three times a day (TID) | ORAL | Status: AC

## 2015-05-25 NOTE — Clinical Social Work Placement (Signed)
   CLINICAL SOCIAL WORK PLACEMENT  NOTE  Date:  05/25/2015  Patient Details  Name: Alec Walker MRN: [id number] Date of Birth: 05/06/1927  Clinical Social Work is seeking post-discharge placement for this patient at the Skilled  Nursing Facility level of care (*CSW will initial, date and re-position this form in  chart as items are completed):  Yes   Patient/family provided with [place] Clinical Social Work Department's list of facilities offering this level of care within the geographic area requested by the patient (or if unable, by the patient's family).  Yes   Patient/family informed of their freedom to choose among providers that offer the needed level of care, that participate in Medicare, Medicaid or managed care program needed by the patient, have an available bed and are willing to accept the patient.  Yes   Patient/family informed of [facility] [facility]'s ownership interest in [facility] and [facility], as well as of the fact that they are under no obligation to receive care at these facilities.  PASRR submitted to EDS on       PASRR number received on       Existing PASRR number confirmed on 05/25/15     FL2 transmitted to all facilities in geographic area requested by pt/family on 05/25/15     FL2 transmitted to all facilities within larger geographic area on       Patient informed that his/her managed care company has contracts with or will negotiate with certain facilities, including the following:        Yes   Patient/family informed of bed offers received.  Patient chooses bed at [facility]     Physician recommends and patient chooses bed at      Patient to be transferred to [facility] on 05/25/15.  Patient to be transferred to facility by [facility])     Patient family notified on 05/25/15 of transfer.  Name of family member notified:  [name]- Daughter     PHYSICIAN Please prepare priority  discharge summary, including medications, Please sign FL2, Please prepare prescriptions     Additional Comment: Ok per MD for d/c today to SNF for short term rehab.  Daughter requested placement if possible in [place] as this is closer to where she works as there are not Environmental manager at [employer] at this time. Discussed with patient and he was ok with this arrangement as long as his daughter knew the plan and was in agreement.  Nursing notified to call report to SNF. Daughter will sign admit papers at the facility.  No further CSW needs identified. DC packeet prepared and d/c summary sent to facility.  CSW will sign off.  [name]. [name] [phone]      _______________________________________________ Lovette Cliche T, LCSW 05/25/2015, 3:10 PM

## 2015-05-25 NOTE — Progress Notes (Signed)
Physical Therapy Treatment Patient Details Name: Alec Walker MRN: [id number] DOB: [birth year] Today's Date: 2015/05/29    History of Present Illness 81 y.o. male with PMH of hypertension, hyperlipidemia, GERD, CAD, S/P stent placement, COPD on 3 L oxygen at home, PUD, PAD, CKD-III, stroke, anemia, diastolic congestive heart failure, s/p of billroth II procedure, who presents with nausea, vomiting, regurgitation weight loss    PT Comments    Pt continues to improve daily with balance, activity tolerance and gait. Will continue to follow. Pt encouraged to walk daily with nursing.  Follow Up Recommendations  SNF;Supervision for mobility/OOB     Equipment Recommendations       Recommendations for Other Services       Precautions / Restrictions Precautions Precautions: Fall Restrictions Weight Bearing Restrictions: No    Mobility  Bed Mobility Overal bed mobility: Modified Independent                Transfers Overall transfer level: Needs assistance   Transfers: Sit to/from Stand Sit to Stand: Supervision         General transfer comment: cues for hand placement  Ambulation/Gait Ambulation/Gait assistance: Supervision Ambulation Distance (Feet): 210 Feet Assistive device: Rolling walker (2 wheeled)     Gait velocity interpretation: Below normal speed for age/gender General Gait Details: cues for posture, position in RW, pt able to avoid obstacles without cues today   Stairs            Wheelchair Mobility    Modified Rankin (Stroke Patients Only)       Balance                                    Cognition Arousal/Alertness: Awake/alert Behavior During Therapy: WFL for tasks assessed/performed Overall Cognitive Status: Within Functional Limits for tasks assessed                      Exercises General Exercises - Lower Extremity Long Arc Quad: AROM;Seated;Both;20 reps Hip ABduction/ADduction: AROM;Seated;Both;20  reps Hip Flexion/Marching: AROM;Seated;Both;20 reps Toe Raises: AROM;Seated;Both;20 reps Heel Raises: AROM;Seated;Both;20 reps    General Comments        Pertinent Vitals/Pain Pain Assessment: No/denies pain    Home Living                      Prior Function            PT Goals (current goals can now be found in the care plan section) Progress towards PT goals: Progressing toward goals    Frequency       PT Plan Current plan remains appropriate    Co-evaluation             End of Session Equipment Utilized During Treatment: Oxygen Activity Tolerance: Patient tolerated treatment well Patient left: in chair;with call bell/phone within reach;with chair alarm set     Time: 2130-8657 PT Time Calculation (min) (ACUTE ONLY): 21 min  Charges:  $Gait Training: 8-22 mins                    G Codes:      Delorse Lek 29-May-2015, 11:21 AM Delaney Meigs, PT [phone]

## 2015-05-25 NOTE — Discharge Instructions (Signed)
Esophagitis °Esophagitis is inflammation of the esophagus. The esophagus is the tube that carries food and liquids from your mouth to your stomach. Esophagitis can cause soreness or pain in the esophagus. This condition can make it difficult and painful to swallow.  °CAUSES °Most causes of esophagitis are not serious. Common causes of this condition include: °· Gastroesophageal reflux disease (GERD). This is when stomach contents move back up into the esophagus (reflux). °· Repeated vomiting. °· An allergic-type reaction, especially caused by food allergies (eosinophilic esophagitis). °· Injury to the esophagus by swallowing large pills with or without water, or swallowing certain types of medicines. °· Swallowing (ingesting) harmful chemicals, such as household cleaning products. °· Heavy alcohol use. °· An infection of the esophagus. This most often occurs in people who have a weakened immune system. °· Radiation or chemotherapy treatment for cancer. °· Certain diseases such as sarcoidosis, Crohn disease, and scleroderma. °SYMPTOMS °Symptoms of this condition include: °· Difficult or painful swallowing. °· Pain with swallowing acidic liquids, such as citrus juices. °· Pain with burping. °· Chest pain. °· Difficulty breathing. °· Nausea. °· Vomiting. °· Pain in the abdomen. °· Weight loss. °· Ulcers in the mouth. °· Patches of white material in the mouth (candidiasis). °· Fever. °· Coughing up blood or vomiting blood. °· Stool that is black, tarry, or bright red. °DIAGNOSIS °Your health care provider will take a medical history and perform a physical exam. You may also have other tests, including: °· An endoscopy to examine your stomach and esophagus with a small camera. °· A test that measures the acidity level in your esophagus. °· A test that measures how much pressure is on your esophagus. °· A barium swallow or modified barium swallow to show the shape, size, and functioning of your esophagus. °· Allergy  tests. °TREATMENT °Treatment for this condition depends on the cause of your esophagitis. In some cases, steroids or other medicines may be given to help relieve your symptoms or to treat the underlying cause of your condition. You may have to make some lifestyle changes, such as: °· Avoiding alcohol. °· Quitting smoking. °· Changing your diet. °· Exercising. °· Changing your sleep habits and your sleep environment. °HOME CARE INSTRUCTIONS °Take these actions to decrease your discomfort and to help avoid complications. °Diet °· Follow a diet as recommended by your health care provider. This may involve avoiding foods and drinks such as: °¨ Coffee and tea (with or without caffeine). °¨ Drinks that contain alcohol. °¨ Energy drinks and sports drinks. °¨ Carbonated drinks or sodas. °¨ Chocolate and cocoa. °¨ Peppermint and mint flavorings. °¨ Garlic and onions. °¨ Horseradish. °¨ Spicy and acidic foods, including peppers, chili powder, curry powder, vinegar, hot sauces, and barbecue sauce. °¨ Citrus fruit juices and citrus fruits, such as oranges, lemons, and limes. °¨ Tomato-based foods, such as red sauce, chili, salsa, and pizza with red sauce. °¨ Fried and fatty foods, such as donuts, french fries, potato chips, and high-fat dressings. °¨ High-fat meats, such as hot dogs and fatty cuts of red and white meats, such as rib eye steak, sausage, ham, and bacon. °¨ High-fat dairy items, such as whole milk, butter, and cream cheese. °· Eat small, frequent meals instead of large meals. °· Avoid drinking large amounts of liquid with your meals. °· Avoid eating meals during the 2-3 hours before bedtime. °· Avoid lying down right after you eat. °· Do not exercise right after you eat. °· Avoid foods and drinks that seem to   make your symptoms worse. °General Instructions °· Pay attention to any changes in your symptoms. °· Take over-the-counter and prescription medicines only as told by your health care provider. Do not take  aspirin, ibuprofen, or other NSAIDs unless your health care provider told you to do so. °· If you have trouble taking pills, use a pill splitter to decrease the size of the pill. This will decrease the chance of the pill getting stuck or injuring your esophagus on the way down. Also, drink water after you take a pill. °· Do not use any tobacco products, including cigarettes, chewing tobacco, and e-cigarettes. If you need help quitting, ask your health care provider. °· Wear loose-fitting clothing. Do not wear anything tight around your waist that causes pressure on your abdomen. °· Raise (elevate) the head of your bed about 6 inches (15 cm). °· Try to reduce your stress, such as with yoga or meditation. If you need help reducing stress, ask your health care provider. °· If you are overweight, reduce your weight to an amount that is healthy for you. Ask your health care provider for guidance about a safe weight loss goal. °· Keep all follow-up visits as told by your health care provider. This is important. °SEEK MEDICAL CARE IF: °· You have new symptoms. °· You have unexplained weight loss. °· You have difficulty swallowing, or it hurts to swallow. °· You have wheezing or a persistent cough. °· Your symptoms do not improve with treatment. °· You have frequent heartburn for more than two weeks. °SEEK IMMEDIATE MEDICAL CARE IF: °· You have severe pain in your arms, neck, jaw, teeth, or back. °· You feel sweaty, dizzy, or light-headed. °· You have chest pain or shortness of breath. °· You vomit and your vomit looks like blood or coffee grounds. °· Your stool is bloody or black. °· You have a fever. °· You cannot swallow, drink, or eat. °  °This information is not intended to replace advice given to you by your health care provider. Make sure you discuss any questions you have with your health care provider. °  °Document Released: 09/11/2004 Document Revised: 04/25/2015 Document Reviewed: 11/29/2014 °Elsevier Interactive  Patient Education ©2016 [employer] Inc. ° °

## 2015-05-25 NOTE — Progress Notes (Signed)
IV removed per discharge order. EMS here to transport patient to [facility]. Report called to [facility] at [facility].

## 2015-05-25 NOTE — Clinical Social Work Note (Signed)
Clinical Social Work Assessment  Patient Details  Name: Alec Walker MRN: [id number] Date of Birth: 01-10-1927  Date of referral:  05/25/15               Reason for consult:  Facility Placement                Permission sought to share information with:  Family Supports, Chartered certified accountant granted to share information::  Yes, Verbal Permission Granted  Name::     [place] SNF's, [facility] Alec Walker Reason  Agency::     Relationship::     Contact Information:     Housing/Transportation Living arrangements for the past 2 months:  [place] of Information:  Patient, Adult Children Patient Interpreter Needed:  None Criminal Activity/Legal Involvement Pertinent to Current Situation/Hospitalization:  No - Comment as needed Significant Relationships:  Adult Children, Spouse Lives with:  Spouse Do you feel safe going back to the place where you live?  Yes Need for family participation in patient care:  Yes (Comment)  Care giving concerns:  Patient reports that he lives at home with his wife who has multiple medical issues including recent back surgery. He is worried that he cannot manage his ADL's at this time.  Requests [facility] as he was a resident there 1 month ago for short term SNF.   Social Worker assessment / plan:  CSW met with patient to discuss short term SNF per PT recommendation. Patient stated that he is aware of this process as he recently returned home from [facility]. He would prefer to return there and requests that CSW defer to his daughter Alec Walker to obtain d/c plan.  Fl2 completed and signed by MD- active bed search initiated. Per MD Patient is medically stable for d/c today to SNF. Patient noted to be somewhat hard of hearing but is alert and oriented.    Employment status:  Retired Forensic scientist:  Medicare PT Recommendations:  [place] / Referral to community resources:   [place]  Patient/Family's Response to care:  Patient states that while he still is weak- he is feeling better and is satisfied with current care and d/c plan.  Patient/Family's Understanding of and Emotional Response to Diagnosis, Current Treatment, and Prognosis:  Patient appears to have a fair understanding of his current diagnosis and treatment needs but he declined to discuss in depth with CSW.  "Please talk to my daughter."  Patient states that he is worried about his wife's care needs but states she is able to live alone while he is in SNF.   Emotional Assessment Appearance:  Appears stated age Attitude/Demeanor/Rapport:   (Quiet, appropriate for age and situation, cooperative) Affect (typically observed):  Quiet, Calm, Pleasant Orientation:  Oriented to Self, Oriented to Place, Oriented to  Time, Oriented to Situation Alcohol / Substance use:  Tobacco Use (Smoking History- quit many years ago) Psych involvement (Current and /or in the community):     Discharge Needs  Concerns to be addressed:  Care Coordination Readmission within the last 30 days:  No Current discharge risk:  Dependent with Mobility Barriers to Discharge:  No Barriers Identified   [name], LCSW 05/25/2015, 10:45 AM

## 2015-05-25 NOTE — Discharge Summary (Signed)
[facility]  Physician Discharge Summary   Patient ID: Alec Walker MRN: [id number] DOB/AGE: [birth year] 81 y.o.  Admit date: 05/21/2015 Discharge date: 05/25/2015  PCP: Eartha Inch, MD  DISCHARGE DIAGNOSES:  Principal Problem:   Nausea & vomiting Active Problems:   COPD (chronic obstructive pulmonary disease) (HCC)   CAD (coronary artery disease)   Elevated troponin   HTN (hypertension)   Weakness generalized   PVD (peripheral vascular disease) (HCC)   Hyperlipidemia with target LDL less than 70   Carotid artery disease (HCC)   CKD (chronic kidney disease), stage III   Chest pain   Protein-calorie malnutrition, severe (HCC)   Failure to thrive in adult   Regurgitation   Chronic respiratory failure with hypoxia (HCC)   RECOMMENDATIONS FOR OUTPATIENT FOLLOW UP: 1. CBC and basic metabolic panel next week   DISCHARGE CONDITION: fair  Diet recommendation: Heart healthy  Filed Weights   05/23/15 0435 05/24/15 0622 05/25/15 0438  Weight: 46.766 kg (103 lb 1.6 oz) 49.125 kg (108 lb 4.8 oz) 49.116 kg (108 lb 4.5 oz)    INITIAL HISTORY: 81 year old male with a history of hypertension, hyperlipidemia, chronic respiratory failure on 3 L at home secondary to COPD, CAD, CKD stage III, and peptic ulcer disease resulting in a Billroth II bypass presents with 4 day history of intractable nausea and vomiting. The patient states that he has had recurrent vomiting on and off for the past 6 months and has lost approximately 13 pounds in the past month. He complains of epigastric and periumbilical pain. Notably, the patient had an upper GI study performed on 05/17/2015 which was negative for any mass or stricture. The patient has been taking diclofenac twice a day for arthritis for many months. He denied any other NSAID use. He has a remote history of a Billroth II bypass secondary to peptic ulcer disease. He denied any hematemesis or coffee grounds emesis. Fecal occult blood  test was negative in the emergency department.  Consultations:  [facility]  Procedures: EGD 10/5 ENDOSCOPIC IMPRESSION: 1. Severe Ulceration at gastrojejunostomy. Multiple biopsies taken 2. Ulcerative Esophagitis. Brushing for fungal smear obtain but suspect this is due to chronic vomiting.  HOSPITAL COURSE:   Ulcerative esophagitis and Ulceration at gastrojejunostomy Patient presented with intractable nausea and vomiting in the setting of chronic NSAID use. Patient underwent upper endoscopy which revealed ulcerative esophagitis and ulceration at the gastroJejunostomy site. Patient was placed on proton pump inhibitors. He was started on Carafate. Symptoms have resolved. He is tolerating solid food. Diclofenac to be discontinued. Nausea and vomiting seems to have resolved. Biopsies were taken and there is no evidence for malignancy based on pathology report.   Epigastric/periumbilical pain Likely secondary to changes noted on EGD. Patient is currently asymptomatic.   CKD stage III Baseline creatinine 1.2-1.5. Renal function is stable.  COPD/chronic respiratory failure with hypoxia Continue Spiriva and Symbicort. Patient is maintained on 3 L nasal cannula at home. Stable without any increased work of breathing  Elevated troponins No symptoms of angina. EKG unchanged from previous comparisons. Likely demand ischemia in the setting of volume depletion and CKD. Does not need any further workup.  Severe protein calorie malnutrition Continue nutritional supplementation  History of stroke Continue aspirin and statin for secondary prophylaxis  Essential Hypertension Continue metoprolol tartrate 25 mg twice a day. Discontinued amlodipine for now. Monitor blood pressures closely at SNF.  Normocytic anemia Hemoglobin is stable. No overt bleeding.  Overall condition is stable. Okay for discharge to SNF  today. I tried calling his daughter with no success.    PERTINENT  LABS:  The results of significant diagnostics from this hospitalization (including imaging, microbiology, ancillary and laboratory) are listed below for reference.    Microbiology: Recent Results (from the past 240 hour(s))  MRSA PCR Screening     Status: Abnormal   Collection Time: 05/21/15 10:16 PM  Result Value Ref Range Status   MRSA by PCR POSITIVE (A) NEGATIVE Final    Comment:        The GeneXpert MRSA Assay (FDA approved for NASAL specimens only), is one component of a comprehensive MRSA colonization surveillance program. It is not intended to diagnose MRSA infection nor to guide or monitor treatment for MRSA infections. RESULT CALLED TO, READ BACK BY AND VERIFIED WITH: [email or web address]  05/21/15 MKELLY      Labs: Basic Metabolic Panel:  Recent Labs Lab 05/21/15 1702 05/22/15 0412 05/23/15 0322 05/24/15 0435  NA 139 144 139 141  K 3.3* 3.5 3.8 3.5  CL 109 114* 110 112*  CO2 25 22 22  21*  GLUCOSE 99 67 106* 94  BUN 19 16 12 8   CREATININE 1.30* 1.06 0.98 0.86  CALCIUM 8.0* 7.8* 7.8* 7.9*   Liver Function Tests:  Recent Labs Lab 05/21/15 1702  AST 9*  ALT 8*  ALKPHOS 75  BILITOT 0.3  PROT 4.4*  ALBUMIN 1.7*    Recent Labs Lab 05/21/15 2247  LIPASE 22   CBC:  Recent Labs Lab 05/21/15 1702 05/22/15 0412 05/24/15 0435  WBC 7.6 5.2 10.1  NEUTROABS 5.9  --   --   HGB 8.3* 8.0* 9.0*  HCT 27.7* 26.1* 29.1*  MCV 90.5 90.6 90.1  PLT 315 263 298   Cardiac Enzymes:  Recent Labs Lab 05/21/15 1702 05/21/15 2247 05/22/15 0412 05/22/15 1100  TROPONINI 0.04* 0.03 0.04* 0.04*   BNP: BNP (last 3 results)  Recent Labs  01/25/15 1707 05/22/15 0412  BNP 214.1* 286.4*    CBG:  Recent Labs Lab 05/22/15 0845 05/23/15 0553 05/24/15 0705 05/25/15 0553 05/25/15 0637  GLUCAP 94 85 88 73 85     IMAGING STUDIES Dg Chest 2 View  05/04/2015   CLINICAL DATA:  Patient complains of nausea vomiting and weakness.  EXAM: CHEST  2 VIEW  COMPARISON:   01/25/2015  FINDINGS: Heart size is normal. No pleural effusion or edema. Aortic atherosclerosis. Marked coarsened interstitial markings are noted throughout both lungs compatible with chronic lung disease. Scar noted in the left base.  IMPRESSION: No acute cardiopulmonary abnormalities.  Diffuse chronic interstitial coarsening noted.  Aortic atherosclerosis and left base scarring.   Electronically Signed   By: Signa Kell M.D.   On: 05/04/2015 08:20   Dg Chest Port 1 View  05/21/2015   CLINICAL DATA:  Acute onset of cough.  Initial encounter.  EXAM: PORTABLE CHEST 1 VIEW  COMPARISON:  Chest radiograph performed earlier today at 7:37 p.m.  FINDINGS: Increased interstitial markings appear to be chronic in nature, with underlying scarring and emphysematous change. No pleural effusion or pneumothorax is seen.  The cardiomediastinal silhouette is normal in size. No acute osseous abnormalities are identified.  IMPRESSION: Chronic lung changes noted, with scarring and emphysematous change. No definite acute airspace consolidation seen.   Electronically Signed   By: Roanna Raider M.D.   On: 05/21/2015 22:49   Dg Abd Acute W/chest  05/21/2015   CLINICAL DATA:  Nausea and vomiting and dizziness.  EXAM: DG ABDOMEN ACUTE W/ 1V CHEST  COMPARISON:  05/17/2015, 05/03/2015  FINDINGS: Hyperinflation evident with mild interstitial prominence, suspect background COPD/emphysema. Normal heart size and vascularity. Bibasilar atelectasis versus scarring as before. No new superimposed pneumonia, collapse or consolidation. No effusion or pneumothorax. No significant edema pattern. Stable mild left hilar prominence, suspect vascular markings. Atherosclerosis of the aorta. Bones are osteopenic. Degenerative changes of the spine.  Large amount retained barium throughout the entire colon. No obstruction pattern. No significant ileus. Aortoiliac extensive atherosclerosis. No free air on the decubitus view.  IMPRESSION: Stable chronic  COPD/ emphysema and parenchymal scarring.  Large volume of retained barium throughout the colon. Negative for obstruction or free air.  Aortoiliac atherosclerosis  Osteopenia and degenerative changes of the spine.   Electronically Signed   By: Judie Petit.  Shick M.D.   On: 05/21/2015 20:03   Dg Ugi  W/kub  05/17/2015   CLINICAL DATA:  40 lb weight loss. Regurgitation of food. Severe gastroesophageal reflux.  EXAM: UPPER GI SERIES WITH KUB  TECHNIQUE: After obtaining a scout radiograph a routine upper GI series was performed using thick and thin barium  FLUOROSCOPY TIME:  Radiation Exposure Index (as provided by the fluoroscopic device):  If the device does not provide the exposure index:  Fluoroscopy Time (in minutes and seconds):  2 minutes  Number of Acquired Images:  12  COMPARISON:  09/25/2014  FINDINGS: The esophagus has a normal morphology. No evidence for esophagitis, ulceration, stricture or mass. No evidence for hiatal hernia. The patient ingested a 13 mm barium tablet which promptly passed through the esophagus and into the stomach.  Evidence of previous gastrectomy with the gastroenteric anastomosis. No evidence for gastric ulceration or stricture. Gastric motility and emptying was unremarkable. No small bowel dilatation.  IMPRESSION: 1. No stricture or mass identified to account for the patient's weight loss and regurgitation.   Electronically Signed   By: Signa Kell M.D.   On: 05/17/2015 13:25    DISCHARGE EXAMINATION: Filed Vitals:   05/24/15 2045 05/24/15 2102 05/25/15 0438 05/25/15 0932  BP:  118/50 120/64   Pulse:  67 62   Temp:  97.9 F (36.6 C) 97.3 F (36.3 C)   TempSrc:  Oral Oral   Resp:  19 20   Height:      Weight:   49.116 kg (108 lb 4.5 oz)   SpO2: 98% 99% 100% 98%   General appearance: alert, cooperative, appears stated age and no distress Resp: clear to auscultation bilaterally Cardio: regular rate and rhythm, S1, S2 normal, no murmur, click, rub or gallop GI: soft,  non-tender; bowel sounds normal; no masses,  no organomegaly Extremities: extremities normal, atraumatic, no cyanosis or edema Neurologic: Alert and oriented X 3, normal strength and tone. Normal symmetric reflexes. Normal coordination and gait  DISPOSITION: SNF  Discharge Instructions    Call MD for:  difficulty breathing, headache or visual disturbances    Complete by:  As directed      Call MD for:  extreme fatigue    Complete by:  As directed      Call MD for:  persistant dizziness or light-headedness    Complete by:  As directed      Call MD for:  persistant nausea and vomiting    Complete by:  As directed      Call MD for:  severe uncontrolled pain    Complete by:  As directed      Call MD for:  temperature >100.4    Complete by:  As directed  Diet - low sodium heart healthy    Complete by:  As directed      Discharge instructions    Complete by:  As directed   You were cared for by a hospitalist during your hospital stay. If you have any questions about your discharge medications or the care you received while you were in the hospital after you are discharged, you can call the unit and asked to speak with the hospitalist on call if the hospitalist that took care of you is not available. Once you are discharged, your primary care physician will handle any further medical issues. Please note that NO REFILLS for any discharge medications will be authorized once you are discharged, as it is imperative that you return to your primary care physician (or establish a relationship with a primary care physician if you do not have one) for your aftercare needs so that they can reassess your need for medications and monitor your lab values. If you do not have a primary care physician, you can call [phone] for a physician referral.     Increase activity slowly    Complete by:  As directed            ALLERGIES:  Allergies  Allergen Reactions  . Ferrous Sulfate Nausea And Vomiting      Current Discharge Medication List    START taking these medications   Details  acetaminophen (TYLENOL) 325 MG tablet Take 2 tablets (650 mg total) by mouth every 6 (six) hours as needed for mild pain (or Fever >/= 101).    atorvastatin (LIPITOR) 40 MG tablet Take 1 tablet (40 mg total) by mouth daily at 6 PM.    guaiFENesin (MUCINEX) 600 MG 12 hr tablet Take 1 tablet (600 mg total) by mouth 2 (two) times daily as needed for cough.    sucralfate (CARAFATE) 1 GM/10ML suspension Take 10 mLs (1 g total) by mouth 4 (four) times daily -  with meals and at bedtime. Qty: 420 mL, Refills: 0      CONTINUE these medications which have CHANGED   Details  temazepam (RESTORIL) 7.5 MG capsule Take 1 capsule (7.5 mg total) by mouth at bedtime as needed for sleep. for sleep Qty: 30 capsule, Refills: 0      CONTINUE these medications which have NOT CHANGED   Details  albuterol (PROVENTIL HFA;VENTOLIN HFA) 108 (90 BASE) MCG/ACT inhaler Inhale 2 puffs into the lungs every 4 (four) hours as needed for wheezing or shortness of breath. Qty: 1 Inhaler, Refills: 0    albuterol (PROVENTIL) (2.5 MG/3ML) 0.083% nebulizer solution INHALE 1 VIAL VIA NEBULIZER EVERY 6 HOURS AS NEEDED FOR WHEEZING Refills: 12    aspirin EC 81 MG tablet Take 81 mg by mouth at bedtime.    budesonide-formoterol (SYMBICORT) 160-4.5 MCG/ACT inhaler Inhale 2 puffs into the lungs 2 (two) times daily.     Cholecalciferol (VITAMIN D3) 2000 UNITS TABS Take 2,000 mg by mouth at bedtime.     metoprolol tartrate (LOPRESSOR) 25 MG tablet Take 25 mg by mouth 2 (two) times daily.     mirtazapine (REMERON) 30 MG tablet Take 30 mg by mouth at bedtime.    nitroGLYCERIN (NITROSTAT) 0.4 MG SL tablet Place 1 tablet (0.4 mg total) under the tongue every 5 (five) minutes as needed for chest pain. Qty: 20 tablet, Refills: 0    OXYGEN-HELIUM IN Inhale 3 L into the lungs daily. 2 liters    pantoprazole (PROTONIX) 40 MG tablet Take 40  mg by  mouth 2 (two) times daily.    simethicone (MYLICON) 80 MG chewable tablet Chew 1 tablet (80 mg total) by mouth every 6 (six) hours as needed for flatulence. Qty: 30 tablet, Refills: 0    tiotropium (SPIRIVA HANDIHALER) 18 MCG inhalation capsule Place 1 capsule (18 mcg total) into inhaler and inhale daily. Qty: 30 capsule, Refills: 0    vitamin B-12 1000 MCG tablet Take 1 tablet (1,000 mcg total) by mouth daily. Qty: 30 tablet, Refills: 3    feeding supplement, ENSURE ENLIVE, (ENSURE ENLIVE) LIQD Take 237 mLs by mouth daily. Qty: 30 Bottle, Refills: 12    ondansetron (ZOFRAN) 4 MG tablet Take 1 tablet (4 mg total) by mouth every 6 (six) hours. Qty: 10 tablet, Refills: 0    senna-docusate (SENOKOT-S) 8.6-50 MG per tablet Take 1 tablet by mouth at bedtime as needed for mild constipation. Qty: 30 tablet, Refills: 0      STOP taking these medications     diclofenac (VOLTAREN) 75 MG EC tablet      amLODipine (NORVASC) 5 MG tablet      benzonatate (TESSALON) 100 MG capsule      ferrous gluconate (FERGON) 324 MG tablet        Follow-up Information    Follow up with Eartha Inch, MD On 05/29/2015.   Specialty:  Family Medicine   Why:  @ 2:45 PM post hospitalization follow up. Confirmed appointment with Kerry Fort.    Contact information:   [address] [place] [address] [phone]       Follow up with Samuel Germany L, MD. Schedule an appointment as soon as possible for a visit in 4 weeks.   Specialty:  Gastroenterology   Why:  post hospitalization follow up   Contact information:   [address] [place] [place] [address] [phone]       TOTAL DISCHARGE TIME: 35 minutes  [facility]  [facility] Pager [phone]  05/25/2015, 11:38 AM

## 2015-05-25 NOTE — Progress Notes (Signed)
Utilization review completed. Sala Tague, RN, BSN. 

## 2015-05-29 ENCOUNTER — Emergency Department (HOSPITAL_COMMUNITY): Payer: Medicare Other

## 2015-05-29 ENCOUNTER — Inpatient Hospital Stay (HOSPITAL_COMMUNITY): Payer: Medicare Other

## 2015-05-29 ENCOUNTER — Inpatient Hospital Stay (HOSPITAL_COMMUNITY)
Admission: EM | Admit: 2015-05-29 | Discharge: 2015-06-02 | DRG: 480 | Disposition: A | Discharge status: Skilled Nursing Facility | Payer: Medicare Other | Attending: Internal Medicine | Admitting: Internal Medicine

## 2015-05-29 ENCOUNTER — Encounter (HOSPITAL_COMMUNITY): Payer: Self-pay | Admitting: Emergency Medicine

## 2015-05-29 DIAGNOSIS — J9811 Atelectasis: Secondary | ICD-10-CM | POA: Diagnosis not present

## 2015-05-29 DIAGNOSIS — K2970 Gastritis, unspecified, without bleeding: Secondary | ICD-10-CM | POA: Diagnosis present

## 2015-05-29 DIAGNOSIS — I4581 Long QT syndrome: Secondary | ICD-10-CM | POA: Diagnosis present

## 2015-05-29 DIAGNOSIS — E876 Hypokalemia: Secondary | ICD-10-CM | POA: Diagnosis present

## 2015-05-29 DIAGNOSIS — E43 Unspecified severe protein-calorie malnutrition: Secondary | ICD-10-CM | POA: Diagnosis present

## 2015-05-29 DIAGNOSIS — S72141A Displaced intertrochanteric fracture of right femur, initial encounter for closed fracture: Principal | ICD-10-CM | POA: Diagnosis present

## 2015-05-29 DIAGNOSIS — K210 Gastro-esophageal reflux disease with esophagitis: Secondary | ICD-10-CM | POA: Diagnosis present

## 2015-05-29 DIAGNOSIS — Z9981 Dependence on supplemental oxygen: Secondary | ICD-10-CM | POA: Diagnosis not present

## 2015-05-29 DIAGNOSIS — M810 Age-related osteoporosis without current pathological fracture: Secondary | ICD-10-CM | POA: Diagnosis present

## 2015-05-29 DIAGNOSIS — I129 Hypertensive chronic kidney disease with stage 1 through stage 4 chronic kidney disease, or unspecified chronic kidney disease: Secondary | ICD-10-CM | POA: Diagnosis present

## 2015-05-29 DIAGNOSIS — S51011A Laceration without foreign body of right elbow, initial encounter: Secondary | ICD-10-CM | POA: Diagnosis present

## 2015-05-29 DIAGNOSIS — I10 Essential (primary) hypertension: Secondary | ICD-10-CM | POA: Diagnosis present

## 2015-05-29 DIAGNOSIS — J9611 Chronic respiratory failure with hypoxia: Secondary | ICD-10-CM | POA: Diagnosis present

## 2015-05-29 DIAGNOSIS — R911 Solitary pulmonary nodule: Secondary | ICD-10-CM | POA: Diagnosis not present

## 2015-05-29 DIAGNOSIS — J438 Other emphysema: Secondary | ICD-10-CM | POA: Diagnosis not present

## 2015-05-29 DIAGNOSIS — Z681 Body mass index (BMI) 19 or less, adult: Secondary | ICD-10-CM

## 2015-05-29 DIAGNOSIS — E785 Hyperlipidemia, unspecified: Secondary | ICD-10-CM | POA: Diagnosis present

## 2015-05-29 DIAGNOSIS — Z79899 Other long term (current) drug therapy: Secondary | ICD-10-CM | POA: Diagnosis not present

## 2015-05-29 DIAGNOSIS — I739 Peripheral vascular disease, unspecified: Secondary | ICD-10-CM | POA: Diagnosis present

## 2015-05-29 DIAGNOSIS — Z7951 Long term (current) use of inhaled steroids: Secondary | ICD-10-CM | POA: Diagnosis not present

## 2015-05-29 DIAGNOSIS — Z7982 Long term (current) use of aspirin: Secondary | ICD-10-CM

## 2015-05-29 DIAGNOSIS — I2510 Atherosclerotic heart disease of native coronary artery without angina pectoris: Secondary | ICD-10-CM | POA: Diagnosis present

## 2015-05-29 DIAGNOSIS — Y92009 Unspecified place in unspecified non-institutional (private) residence as the place of occurrence of the external cause: Secondary | ICD-10-CM | POA: Diagnosis not present

## 2015-05-29 DIAGNOSIS — Z419 Encounter for procedure for purposes other than remedying health state, unspecified: Secondary | ICD-10-CM

## 2015-05-29 DIAGNOSIS — Z8673 Personal history of transient ischemic attack (TIA), and cerebral infarction without residual deficits: Secondary | ICD-10-CM | POA: Diagnosis not present

## 2015-05-29 DIAGNOSIS — S72009A Fracture of unspecified part of neck of unspecified femur, initial encounter for closed fracture: Secondary | ICD-10-CM

## 2015-05-29 DIAGNOSIS — N183 Chronic kidney disease, stage 3 unspecified: Secondary | ICD-10-CM | POA: Diagnosis present

## 2015-05-29 DIAGNOSIS — Z87891 Personal history of nicotine dependence: Secondary | ICD-10-CM | POA: Diagnosis not present

## 2015-05-29 DIAGNOSIS — M25551 Pain in right hip: Secondary | ICD-10-CM | POA: Diagnosis present

## 2015-05-29 DIAGNOSIS — Z66 Do not resuscitate: Secondary | ICD-10-CM | POA: Diagnosis present

## 2015-05-29 DIAGNOSIS — J449 Chronic obstructive pulmonary disease, unspecified: Secondary | ICD-10-CM | POA: Diagnosis present

## 2015-05-29 DIAGNOSIS — Z8249 Family history of ischemic heart disease and other diseases of the circulatory system: Secondary | ICD-10-CM

## 2015-05-29 DIAGNOSIS — Z955 Presence of coronary angioplasty implant and graft: Secondary | ICD-10-CM | POA: Diagnosis not present

## 2015-05-29 DIAGNOSIS — R627 Adult failure to thrive: Secondary | ICD-10-CM | POA: Diagnosis present

## 2015-05-29 DIAGNOSIS — J90 Pleural effusion, not elsewhere classified: Secondary | ICD-10-CM | POA: Diagnosis present

## 2015-05-29 DIAGNOSIS — T39395A Adverse effect of other nonsteroidal anti-inflammatory drugs [NSAID], initial encounter: Secondary | ICD-10-CM | POA: Diagnosis present

## 2015-05-29 DIAGNOSIS — S72141D Displaced intertrochanteric fracture of right femur, subsequent encounter for closed fracture with routine healing: Secondary | ICD-10-CM | POA: Diagnosis not present

## 2015-05-29 DIAGNOSIS — D62 Acute posthemorrhagic anemia: Secondary | ICD-10-CM | POA: Diagnosis present

## 2015-05-29 DIAGNOSIS — W1830XA Fall on same level, unspecified, initial encounter: Secondary | ICD-10-CM | POA: Diagnosis present

## 2015-05-29 DIAGNOSIS — Z823 Family history of stroke: Secondary | ICD-10-CM | POA: Diagnosis not present

## 2015-05-29 DIAGNOSIS — R339 Retention of urine, unspecified: Secondary | ICD-10-CM | POA: Diagnosis present

## 2015-05-29 HISTORY — DX: Anxiety disorder, unspecified: F41.9

## 2015-05-29 HISTORY — DX: Gastro-esophageal reflux disease without esophagitis: K21.9

## 2015-05-29 HISTORY — DX: Pneumonia, unspecified organism: J18.9

## 2015-05-29 HISTORY — DX: Gastric ulcer, unspecified as acute or chronic, without hemorrhage or perforation: K25.9

## 2015-05-29 HISTORY — DX: Unspecified osteoarthritis, unspecified site: M19.90

## 2015-05-29 HISTORY — DX: Depression, unspecified: F32.A

## 2015-05-29 HISTORY — DX: Dependence on supplemental oxygen: Z99.81

## 2015-05-29 HISTORY — DX: Major depressive disorder, single episode, unspecified: F32.9

## 2015-05-29 HISTORY — DX: Personal history of other medical treatment: Z92.89

## 2015-05-29 LAB — URINE MICROSCOPIC-ADD ON

## 2015-05-29 LAB — CBC WITH DIFFERENTIAL/PLATELET
BASOS ABS: 0.1 10*3/uL (ref 0.0–0.1)
Basophils Relative: 1 %
EOS ABS: 0.5 10*3/uL (ref 0.0–0.7)
Eosinophils Relative: 5 %
HCT: 30.1 % — ABNORMAL LOW (ref 39.0–52.0)
HEMOGLOBIN: 9 g/dL — AB (ref 13.0–17.0)
LYMPHS PCT: 9 %
Lymphs Abs: 0.8 10*3/uL (ref 0.7–4.0)
MCH: 26.9 pg (ref 26.0–34.0)
MCHC: 29.9 g/dL — AB (ref 30.0–36.0)
MCV: 89.9 fL (ref 78.0–100.0)
Monocytes Absolute: 0.7 10*3/uL (ref 0.1–1.0)
Monocytes Relative: 7 %
NEUTROS PCT: 78 %
Neutro Abs: 7.2 10*3/uL (ref 1.7–7.7)
Platelets: 331 10*3/uL (ref 150–400)
RBC: 3.35 MIL/uL — AB (ref 4.22–5.81)
RDW: 15.1 % (ref 11.5–15.5)
WBC: 9.3 10*3/uL (ref 4.0–10.5)

## 2015-05-29 LAB — COMPREHENSIVE METABOLIC PANEL
ALBUMIN: 1.8 g/dL — AB (ref 3.5–5.0)
ALK PHOS: 92 U/L (ref 38–126)
ALT: 11 U/L — AB (ref 17–63)
ANION GAP: 7 (ref 5–15)
AST: 14 U/L — ABNORMAL LOW (ref 15–41)
BUN: 12 mg/dL (ref 6–20)
CALCIUM: 8.1 mg/dL — AB (ref 8.9–10.3)
CO2: 27 mmol/L (ref 22–32)
Chloride: 104 mmol/L (ref 101–111)
Creatinine, Ser: 0.96 mg/dL (ref 0.61–1.24)
GFR calc Af Amer: >60 mL/min (ref >60)
GFR calc non Af Amer: >60 mL/min (ref >60)
GLUCOSE: 142 mg/dL — AB (ref 65–99)
Potassium: 3.3 mmol/L — ABNORMAL LOW (ref 3.5–5.1)
SODIUM: 138 mmol/L (ref 135–145)
Total Bilirubin: 0.2 mg/dL — ABNORMAL LOW (ref 0.3–1.2)
Total Protein: 4.6 g/dL — ABNORMAL LOW (ref 6.5–8.1)

## 2015-05-29 LAB — URINALYSIS, ROUTINE W REFLEX MICROSCOPIC
Glucose, UA: NEGATIVE mg/dL
Ketones, ur: NEGATIVE mg/dL
LEUKOCYTES UA: NEGATIVE
NITRITE: NEGATIVE
PH: 5.5 (ref 5.0–8.0)
Protein, ur: NEGATIVE mg/dL
Specific Gravity, Urine: 1.016 (ref 1.005–1.030)
Urobilinogen, UA: 0.2 mg/dL (ref 0.0–1.0)

## 2015-05-29 LAB — APTT: aPTT: 37 seconds (ref 24–37)

## 2015-05-29 LAB — PROTIME-INR
INR: 1.01 (ref 0.00–1.49)
Prothrombin Time: 13.5 seconds (ref 11.6–15.2)

## 2015-05-29 MED ORDER — ATORVASTATIN CALCIUM 40 MG PO TABS
40.0000 mg | ORAL_TABLET | Freq: Every day | ORAL | Status: DC
  Administered 2015-05-29 – 2015-06-01 (×3): 40 mg via ORAL
  Filled 2015-05-29 (×3): qty 1

## 2015-05-29 MED ORDER — SODIUM CHLORIDE 0.9 % IV SOLN: INTRAVENOUS | Status: DC

## 2015-05-29 MED ORDER — PANTOPRAZOLE SODIUM 40 MG PO TBEC
40.0000 mg | DELAYED_RELEASE_TABLET | Freq: Two times a day (BID) | ORAL | Status: DC
  Administered 2015-05-29 – 2015-06-02 (×7): 40 mg via ORAL
  Filled 2015-05-29 (×7): qty 1

## 2015-05-29 MED ORDER — HYDROCODONE-ACETAMINOPHEN 5-325 MG PO TABS
1.0000 | ORAL_TABLET | Freq: Four times a day (QID) | ORAL | Status: DC | PRN
  Administered 2015-05-29 – 2015-05-31 (×3): 2 via ORAL
  Administered 2015-06-01 – 2015-06-02 (×3): 1 via ORAL
  Filled 2015-05-29: qty 2
  Filled 2015-05-29: qty 1
  Filled 2015-05-29 (×2): qty 2
  Filled 2015-05-29 (×2): qty 1

## 2015-05-29 MED ORDER — TIOTROPIUM BROMIDE MONOHYDRATE 18 MCG IN CAPS
18.0000 ug | ORAL_CAPSULE | Freq: Every day | RESPIRATORY_TRACT | Status: DC
  Administered 2015-05-30 – 2015-06-02 (×4): 18 ug via RESPIRATORY_TRACT
  Filled 2015-05-29: qty 5

## 2015-05-29 MED ORDER — CEFAZOLIN SODIUM-DEXTROSE 2-3 GM-% IV SOLR
2.0000 g | INTRAVENOUS | Status: AC
  Administered 2015-05-30: 2 g via INTRAVENOUS
  Filled 2015-05-29 (×2): qty 50

## 2015-05-29 MED ORDER — POTASSIUM CHLORIDE IN NACL 20-0.9 MEQ/L-% IV SOLN
INTRAVENOUS | Status: DC
  Administered 2015-05-29 – 2015-06-01 (×3): via INTRAVENOUS
  Filled 2015-05-29 (×4): qty 1000

## 2015-05-29 MED ORDER — SUCRALFATE 1 GM/10ML PO SUSP
1.0000 g | Freq: Three times a day (TID) | ORAL | Status: DC
  Administered 2015-05-29 – 2015-06-02 (×8): 1 g via ORAL
  Filled 2015-05-29 (×8): qty 10

## 2015-05-29 MED ORDER — CETYLPYRIDINIUM CHLORIDE 0.05 % MT LIQD
7.0000 mL | Freq: Two times a day (BID) | OROMUCOSAL | Status: DC
  Administered 2015-05-29 – 2015-06-01 (×8): 7 mL via OROMUCOSAL

## 2015-05-29 MED ORDER — BUDESONIDE-FORMOTEROL FUMARATE 160-4.5 MCG/ACT IN AERO
2.0000 | INHALATION_SPRAY | Freq: Two times a day (BID) | RESPIRATORY_TRACT | Status: DC
  Administered 2015-05-29 – 2015-06-02 (×8): 2 via RESPIRATORY_TRACT
  Filled 2015-05-29: qty 6

## 2015-05-29 MED ORDER — ENSURE ENLIVE PO LIQD: 237.0000 mL | ORAL | Status: DC

## 2015-05-29 MED ORDER — ACETAMINOPHEN 500 MG PO TABS
1000.0000 mg | ORAL_TABLET | Freq: Once | ORAL | Status: AC
  Administered 2015-05-29: 1000 mg via ORAL
  Filled 2015-05-29: qty 2

## 2015-05-29 MED ORDER — ALBUTEROL SULFATE (2.5 MG/3ML) 0.083% IN NEBU
2.5000 mg | INHALATION_SOLUTION | RESPIRATORY_TRACT | Status: DC | PRN
  Administered 2015-05-31: 2.5 mg via RESPIRATORY_TRACT
  Filled 2015-05-29: qty 3

## 2015-05-29 MED ORDER — ASPIRIN EC 81 MG PO TBEC
81.0000 mg | DELAYED_RELEASE_TABLET | Freq: Every day | ORAL | Status: DC
  Administered 2015-05-29: 81 mg via ORAL
  Filled 2015-05-29: qty 1

## 2015-05-29 MED ORDER — MIRTAZAPINE 15 MG PO TABS
30.0000 mg | ORAL_TABLET | Freq: Every day | ORAL | Status: DC
  Administered 2015-05-29 – 2015-06-01 (×4): 30 mg via ORAL
  Filled 2015-05-29 (×4): qty 2

## 2015-05-29 MED ORDER — METOPROLOL TARTRATE 25 MG PO TABS
25.0000 mg | ORAL_TABLET | Freq: Two times a day (BID) | ORAL | Status: DC
  Administered 2015-05-29 – 2015-06-02 (×6): 25 mg via ORAL
  Filled 2015-05-29 (×7): qty 1

## 2015-05-29 MED ORDER — CHLORHEXIDINE GLUCONATE 4 % EX LIQD
60.0000 mL | Freq: Once | CUTANEOUS | Status: AC
  Administered 2015-05-30: 4 via TOPICAL
  Filled 2015-05-29: qty 60

## 2015-05-29 MED ORDER — PRO-STAT SUGAR FREE PO LIQD
30.0000 mL | Freq: Every day | ORAL | Status: DC
  Administered 2015-05-29 – 2015-06-01 (×3): 30 mL via ORAL
  Filled 2015-05-29 (×3): qty 30

## 2015-05-29 MED ORDER — ONDANSETRON HCL 4 MG/2ML IJ SOLN
4.0000 mg | Freq: Once | INTRAMUSCULAR | Status: AC
  Administered 2015-05-29: 4 mg via INTRAVENOUS
  Filled 2015-05-29: qty 2

## 2015-05-29 MED ORDER — BISACODYL 10 MG RE SUPP: 10.0000 mg | Freq: Every day | RECTAL | Status: DC | PRN

## 2015-05-29 MED ORDER — SODIUM CHLORIDE 0.9 % IV SOLN
INTRAVENOUS | Status: DC
  Administered 2015-05-29: 04:00:00 via INTRAVENOUS

## 2015-05-29 MED ORDER — FENTANYL CITRATE (PF) 100 MCG/2ML IJ SOLN
25.0000 ug | Freq: Once | INTRAMUSCULAR | Status: AC
  Administered 2015-05-29: 25 ug via INTRAVENOUS
  Filled 2015-05-29: qty 2

## 2015-05-29 MED ORDER — BOOST / RESOURCE BREEZE PO LIQD
1.0000 | Freq: Three times a day (TID) | ORAL | Status: DC
  Administered 2015-05-29 – 2015-06-02 (×5): 1 via ORAL

## 2015-05-29 MED ORDER — MORPHINE SULFATE (PF) 2 MG/ML IV SOLN
0.5000 mg | INTRAVENOUS | Status: DC | PRN
  Administered 2015-05-29 – 2015-05-30 (×4): 0.5 mg via INTRAVENOUS
  Filled 2015-05-29 (×4): qty 1

## 2015-05-29 NOTE — ED Notes (Signed)
Admitting MD in to speak with pt 

## 2015-05-29 NOTE — H&P (Signed)
History and Physical  Alec WOODRICK  Walker:[id number]  DOB: 09-03-1926  DOA: 05/29/2015  Referring physician: Rochele Raring, MD PCP: Eartha Inch, MD   Chief Complaint: Hip pain after fall  HPI: Alec Walker is a 81 y.o. male with a past medical history significant for HTN, hyperlipidemia, chronic respiratory failure on 3 L at home secondary to COPD, CAD, CKD stage III, and PUD s/p remote Billroth II and admission 1 week ago who presents with fall and hip fracture.  The patient had been home for one week when he was readying for bed tonight, felt light-headed and fell.  He immediately had severe right groin pain and could not stand.  EMS brought him to the ER where he was noted to have a new right intertrochanteric hip fracture.  He was hemodynamically stable and had mild hypokalemia and stable anemia.  His chest x-ray was clear and his ECG showed stable old non-specific T-wave changes, similar to previous.  He was evaluated by Dr. Eulah Pont and recommended for operative fixation.  He has no active cardiovascular disease or angina.  The last nuclear perfusion study was in 2015 and was low risk.  He has severe COPD (GOLD stage III) and uses 3L home O2 at baseline.  He has no history of diabetes.  He has a remote history of stroke.   Review of Systems:  Patient seen 6:18 AM on 05/29/2015. Pt complains of hip pain, fatigue. Pt denies any chest pain, palpitations, leg swelling.  All other systems negative except as just noted or noted in the history of present illness.    Past Medical History  Diagnosis Date  . COPD (chronic obstructive pulmonary disease) (HCC)   . CAD (coronary artery disease)     a.  nonobstructive CAD by cath 1999. b. Myoview 04/2012: fixed inferior and inferoseptal bowel attenuation artifact, no reversible ischemia. EF 62%. Low risk scan.  Marland Kitchen HTN (hypertension)   . PUD (peptic ulcer disease)     a. s/p surgery 1976.  Marland Kitchen Syncope     2012  . Carotid artery  disease (HCC) 03/22/13    a. 50-69% BICA by duplex 03/2013, repeat needed 11/2013.  Marland Kitchen PAD (peripheral artery disease) (HCC)     a. prior stenting of RCIA 1999. b. history of 90% vertebral artery narrowing, 40% subclavian artery narrowing. c. Diagnostic PV angio 08/2013 for progressive claudication - will ultimately need endarterectomy/patch angio on bilat CFA stenosis; will also need atherectomy/PT/stenting to REIA.   . CKD (chronic kidney disease), stage III   . HLD (hyperlipidemia)   . Chronic respiratory failure (HCC)     a. On home O2.  Marland Kitchen NSVT (nonsustained ventricular tachycardia) (HCC)     a. Brief NSVT (6b) during 08/2013 adm.  . Stroke (HCC)   . Shortness of breath   . On supplemental oxygen therapy     2l  The above past medical history was reviewed.  Past Surgical History  Procedure Laterality Date  . Cataract extraction    . Gastrectomy    . Transurethral resection of prostate    . Coronary angioplasty with stent placement      stent in 2003; history of right common iliac artery stent hx  . Endarterectomy femoral Bilateral 11/11/2013    Procedure: BILATERAL FEMORAL ENDARTERECTOMIES WITH BILATERAL PATCH ANGIOPLASTIES;  Surgeon: Nada Libman, MD;  Location: [facility] OR;  Service: Vascular;  Laterality: Bilateral;  . Lower extremity angiogram N/A 09/01/2013    Procedure: LOWER EXTREMITY ANGIOGRAM;  Surgeon: Runell Gess, MD;  Location: [facility] CATH LAB;  Service: Cardiovascular;  Laterality: N/A;  . Esophagogastroduodenoscopy N/A 05/23/2015    Procedure: ESOPHAGOGASTRODUODENOSCOPY (EGD);  Surgeon: Carman Ching, MD;  Location: [facility] ENDOSCOPY;  Service: Endoscopy;  Laterality: N/A;  The above surgical history was reviewed.  Social History: Patient lives with his wife in [place].  He worked for [employer].  He is retired.  He used to smoke, but quit >20 years ago.    Allergies  Allergen Reactions  . Ferrous Sulfate Nausea And Vomiting    Family History  Problem Relation Age of Onset    . Hypertension    . Coronary artery disease    . Stroke Mother   . Heart disease Mother   . Hypertension Mother   . Heart attack Mother   . Heart attack Brother   . Heart disease Brother   . Heart disease Father   . Hypertension Father   . Heart attack Father     Prior to Admission medications   Medication Sig Start Date End Date Taking? Authorizing Provider  acetaminophen (TYLENOL) 325 MG tablet Take 2 tablets (650 mg total) by mouth every 6 (six) hours as needed for mild pain (or Fever >/= 101). 05/25/15   Osvaldo Shipper, MD  albuterol (PROVENTIL HFA;VENTOLIN HFA) 108 (90 BASE) MCG/ACT inhaler Inhale 2 puffs into the lungs every 4 (four) hours as needed for wheezing or shortness of breath. 06/13/14   Renae Fickle, MD  albuterol (PROVENTIL) (2.5 MG/3ML) 0.083% nebulizer solution INHALE 1 VIAL VIA NEBULIZER EVERY 6 HOURS AS NEEDED FOR WHEEZING 04/23/15   Historical Provider, MD  aspirin EC 81 MG tablet Take 81 mg by mouth at bedtime.    Historical Provider, MD  atorvastatin (LIPITOR) 40 MG tablet Take 1 tablet (40 mg total) by mouth daily at 6 PM. 05/25/15   Osvaldo Shipper, MD  budesonide-formoterol [facility]) 160-4.5 MCG/ACT inhaler Inhale 2 puffs into the lungs 2 (two) times daily.     Historical Provider, MD  Cholecalciferol (VITAMIN D3) 2000 UNITS TABS Take 2,000 mg by mouth at bedtime.     Historical Provider, MD  feeding supplement, ENSURE ENLIVE, (ENSURE ENLIVE) LIQD Take 237 mLs by mouth daily. Patient not taking: Reported on 05/03/2015 01/29/15   Hyacinth Meeker, MD  guaiFENesin (MUCINEX) 600 MG 12 hr tablet Take 1 tablet (600 mg total) by mouth 2 (two) times daily as needed for cough. 05/25/15   Osvaldo Shipper, MD  metoprolol tartrate (LOPRESSOR) 25 MG tablet Take 25 mg by mouth 2 (two) times daily.     Historical Provider, MD  mirtazapine (REMERON) 30 MG tablet Take 30 mg by mouth at bedtime.    Historical Provider, MD  nitroGLYCERIN (NITROSTAT) 0.4 MG SL tablet Place 1 tablet (0.4  mg total) under the tongue every 5 (five) minutes as needed for chest pain. 06/13/14   Renae Fickle, MD  ondansetron (ZOFRAN) 4 MG tablet Take 1 tablet (4 mg total) by mouth every 6 (six) hours. Patient not taking: Reported on 05/21/2015 05/03/15   Donnetta Hutching, MD  OXYGEN-HELIUM IN Inhale 3 L into the lungs daily. 2 liters    Historical Provider, MD  pantoprazole (PROTONIX) 40 MG tablet Take 40 mg by mouth 2 (two) times daily.    Historical Provider, MD  senna-docusate (SENOKOT-S) 8.6-50 MG per tablet Take 1 tablet by mouth at bedtime as needed for mild constipation. Patient not taking: Reported on 05/03/2015 01/29/15   Hyacinth Meeker, MD  simethicone [facility]) 80  MG chewable tablet Chew 1 tablet (80 mg total) by mouth every 6 (six) hours as needed for flatulence. 01/29/15   Tasrif Ahmed, MD  sucralfate (CARAFATE) 1 GM/10ML suspension Take 10 mLs (1 g total) by mouth 4 (four) times daily -  with meals and at bedtime. 05/25/15   Osvaldo Shipper, MD  temazepam (RESTORIL) 7.5 MG capsule Take 1 capsule (7.5 mg total) by mouth at bedtime as needed for sleep. for sleep 05/25/15   Osvaldo Shipper, MD  tiotropium (SPIRIVA HANDIHALER) 18 MCG inhalation capsule Place 1 capsule (18 mcg total) into inhaler and inhale daily. 06/13/14   Renae Fickle, MD  vitamin B-12 1000 MCG tablet Take 1 tablet (1,000 mcg total) by mouth daily. 01/29/15   Hyacinth Meeker, MD    Physical Exam: BP 101/51 mmHg  Pulse 62  Temp(Src)   Resp 22  Ht  (1.778 m)  Wt 45.36 kg (100 lb)  BMI 14.35 kg/m2  SpO2 95% General appearance: Frail elderly male, alert and in no acute distress.  Responds appropriately to questions.   Eyes: Sclerae normal without icterus, conjunctiva pink, lids and lashes normal.  PERRL and EOMI.   Nose: No deformity, discharge, or epistaxis.   Mouth: OP moist without erythema, exudates, cobblestoning, or ulcers.  No airway deformities.   Skin: Pale.  Warm, dry.  No jaundice.   Cardiac: RRR, nl S1-S2, SEM  audible at LUSB radiating to carotids.  JVP normal.  No LE edema.  Radial and DP pulses 2+ and symmetric. Respiratory: Normal respiratory rate and rhythm.  CTAB without wheezes. Abdomen: BS present.  Abdominal bruit present.  Abdomen soft without rigidity.  No TTP or rebound all quadrants. No ascites, distension.   MSK: There is tenderness at the right hip.  The right leg is shortened and externally rotated.  Neuro: Sensorium intact.  Cranial nerves grossly intact.  Speech is fluent.  Attention and concentration are normal.  Memory seems intact.  Moves all extremities equally and with normal coordination.    Psych: Appropriate affect.  Speech normal. Thought content/process linear/appropriate.  No evidence of aural or visual hallucinations or delusions.       Labs on Admission:  The metabolic panel is notable for hypokalemia, normal serum creatinine. The complete blood count is notable for anemia, stable from previous 9 g/dL.   Radiological Exams on Admission: Personally reviewed: Dg Chest 1 View 05/29/2015   IMPRESSION: Emphysematous and chronic bronchitic changes in the lungs with scattered fibrosis. Atelectasis in the lung bases.     Ct Head Wo Contrast 05/29/2015 IMPRESSION: No acute intracranial abnormalities. Chronic atrophy and small vessel ischemic changes.  Diffuse bone demineralization. Normal alignment of the cervical spine. Diffuse degenerative changes. No acute displaced fractures identified.  Incidental note of a possible lesion in the right lung apex with irregular margins and cavitation. CT chest is suggested to exclude pulmonary neoplasm. Small left pleural effusion versus pleural thickening.      Dg Hip Unilat With Pelvis 2-3 Views Right 05/29/2015  IMPRESSION: Acute comminuted inter trochanteric fracture of the right hip with varus angulation and displacement of lesser trochanteric fragment.       EKG: Independently reviewed. Normal sinus, diffuse TW changes,  stable from previous.    Assessment/Plan  1. R intertrochanteric hip fracture:  This is new.  Discussed with Dr. Eulah Pont, who can take patient to OR today or tomorrow.  This is an intermediate risk procedure and the patient is at average risk for cardiovascular complications.  Despite that his chronic lung disease is currently stable, he may be at high risk for respiratory complications. -- Consult to Pulmonology for preoperative pulmonary clearance -- ASA protocol for vital signs, neurovasc checks, and incentive spirometry -- NPO effective now -- Consult to SLP, social work and dietitian -- Hydrocodone for moderate pain, morphine IV for severe pain  2. COPD, chronic respiratory failure on home O2:  This is new.   -- Continue home albuiterol, Symbicort and tiotropium  3. CAD and peripheral vascular disease:  The patient has no active cardiovascular disease or angina.  Pending Pulmonology evaluation, he should not delay surgery for cardiovascular testing. -- Continue home aspirin, statin, BB  4. HTN:  Stable Continue home BB   5. CKD:  Stable.   6. Severe protein calorie malnutrition:  Patient reports 30 lb weight loss in last two months.  He has albumin 1.8 g/dL.  This failure to thrive is likely related to the esophagitis and gastritis that was NSAID induced and for which he was admitted last week.  It is also compounded by his severe lung disease. -- The patient is at high risk for post-operative complications of poor wound healing -- Nutrition consult      DVT PPx: SCDs Diet: NPO Consultants: Orthopedics, Pulmonology Code Status: DNR   Disposition Plan:  At the time of admission, it appears that the appropriate admission status for this patient is INPATIENT. This is judged to be reasonable and necessary in order to provide the required intensity of service to ensure the patient's safety given the presenting symptoms, physical exam findings, and initial radiographic  and laboratory data in the context of their chronic comorbidities.  Together, these circumstances are felt to place her/him at high risk for further clinical deterioration threatening life, limb, or organ.    Alberteen Sam [facility] Pager [phone]

## 2015-05-29 NOTE — ED Notes (Signed)
Dr. Ward at the bedside.  

## 2015-05-29 NOTE — Progress Notes (Signed)
SLP cancellation note: Orders received for bedside swallow evaluation - currently in CT; per RN, NPO pending possible surgery today.  SLP will follow-up after surgery.  D/W RN.  Aser Nylund L. Samson Frederic, [place] CCC/SLP Pager [phone]

## 2015-05-29 NOTE — Progress Notes (Signed)
Initial Nutrition Assessment  DOCUMENTATION CODES:   Severe malnutrition in context of chronic illness, Underweight  INTERVENTION:  Discontinue Ensure.  Provide Boost Breeze po TID, each supplement provides 250 kcal and 9 grams of protein.  Provide 30 ml Prostat po once daily, each supplement provides 100 kcal and 15 grams of protein.  Encourage adequate PO intake.   NUTRITION DIAGNOSIS:   Malnutrition related to chronic illness as evidenced by severe depletion of body fat, severe depletion of muscle mass.  GOAL:   Patient will meet greater than or equal to 90% of their needs  MONITOR:   PO intake, Supplement acceptance, Weight trends, Labs, I & O's, Skin  REASON FOR ASSESSMENT:   Consult Assessment of nutrition requirement/status  ASSESSMENT:   81 y.o. male with a past medical history significant for HTN, hyperlipidemia, chronic respiratory failure on 3 L at home secondary to COPD, CAD, CKD stage III, and PUD s/p remote Billroth II and admission 1 week ago who presents with fall and hip fracture.  Pt reports having a good appetite with consumption of at least 3 meals daily. Pt reports having weight loss. Pt with a 7.4% weight loss since last previous admission, however question accuracy. Pt is agreeable to protein supplements to aid in caloric and protein needs. RD to order. Pt does reports not liking Ensure. Nutrition-Focused physical exam completed. Findings are severe fat depletion, severe muscle depletion, and moderate edema.   Plans for surgery tomorrow.   Labs and medications reviewed.   Diet Order:  Diet Heart Room service appropriate?: Yes; Fluid consistency:: Thin  Skin:   (+2 LE edema)  Last BM:  10/10  Height:   Ht Readings from Last 1 Encounters:  05/29/15  (1.778 m)    Weight:   Wt Readings from Last 1 Encounters:  05/29/15 100 lb (45.36 kg)    Ideal Body Weight:  75.45 kg  BMI:  Body mass index is 14.35 kg/(m^2).  Estimated  Nutritional Needs:   Kcal:  1550-1750  Protein:  70-85 grams  Fluid:  1.5 - 1.7 L/day  EDUCATION NEEDS:   No education needs identified at this time  Roslyn Smiling, MS, RD, LDN Pager # [phone] After hours/ weekend pager # [phone]

## 2015-05-29 NOTE — Progress Notes (Signed)
Patient admitted after midnight. Chart reviewed. Patient examined. Will need placement postoperatively. Note from orthopedics, and pulmonary consult pending. Medically optimized, but increased risk for pulmonary complications due to lung disease.  Crista Curb, MD [facility] [email or web address] password [facility]

## 2015-05-29 NOTE — Progress Notes (Signed)
Patient was due to void and had not yet by 1430. Patient was bladder scanned at 1500 and found to have at least in his bladder. Per protocol, nurse performed an in and out catheter resulting in output. A sample was collected and sent to lab. Analysis results included a moderate amount of Hgb, which may be attributed to mild trauma from the first attempt by the NT.

## 2015-05-29 NOTE — ED Notes (Signed)
Pt. lost his balance and fell at home this evening , no LOC , presents with right elbow skin tear , right hip and right upper thigh pain .

## 2015-05-29 NOTE — Consult Note (Signed)
ORTHOPAEDIC CONSULTATION  REQUESTING PHYSICIAN: Delfina Redwood, MD  Chief Complaint: R hip pain  HPI: Alec Walker is a 81 y.o. male who complains of R hip pain after a mechanical fall at home approximately one week ago.  The patient had persistent pain and trouble with ambulation after the fall, so he presented to the ED.  Found to have a R intertrochanteric hip fracture on xray.  Patient reports that at the time of the fall he felt light headed and dizzy.  He then fell to the floor.  He denies hitting his head or LOC. Patient has end stage COPD and uses 3L of O2 at home.  He also has a hx of CAD with recent nuclear study in 2015 showing low risk.    Past Medical History  Diagnosis Date  . COPD (chronic obstructive pulmonary disease) ([place])   . CAD (coronary artery disease)     a.  nonobstructive CAD by cath 1999. b. Myoview 04/2012: fixed inferior and inferoseptal bowel attenuation artifact, no reversible ischemia. EF 62%. Low risk scan.  Marland Kitchen HTN (hypertension)   . PUD (peptic ulcer disease)     a. s/p surgery 1976.  Marland Kitchen Syncope     2012  . Carotid artery disease ([place]) 03/22/13    a. 50-69% BICA by duplex 03/2013, repeat needed 11/2013.  Marland Kitchen PAD (peripheral artery disease) ([place])     a. prior stenting of RCIA 1999. b. history of 90% vertebral artery narrowing, 40% subclavian artery narrowing. c. Diagnostic PV angio 08/2013 for progressive claudication - will ultimately need endarterectomy/patch angio on bilat CFA stenosis; will also need atherectomy/PT/stenting to REIA.   . CKD (chronic kidney disease), stage III   . HLD (hyperlipidemia)   . Chronic respiratory failure ([place])     a. On home O2.  Marland Kitchen NSVT (nonsustained ventricular tachycardia) ([place])     a. Brief NSVT (6b) during 08/2013 adm.  . Stroke ([place])   . Shortness of breath   . On supplemental oxygen therapy     2l   Past Surgical History  Procedure Laterality Date  . Cataract extraction    . Gastrectomy    .  Transurethral resection of prostate    . Coronary angioplasty with stent placement      stent in 2003; history of right common iliac artery stent hx  . Endarterectomy femoral Bilateral 11/11/2013    Procedure: BILATERAL FEMORAL ENDARTERECTOMIES WITH BILATERAL PATCH ANGIOPLASTIES;  Surgeon: Serafina Mitchell, MD;  Location: [facility];  Service: Vascular;  Laterality: Bilateral;  . Lower extremity angiogram N/A 09/01/2013    Procedure: LOWER EXTREMITY ANGIOGRAM;  Surgeon: Lorretta Harp, MD;  Location: [facility] CATH LAB;  Service: Cardiovascular;  Laterality: N/A;  . Esophagogastroduodenoscopy N/A 05/23/2015    Procedure: ESOPHAGOGASTRODUODENOSCOPY (EGD);  Surgeon: Laurence Spates, MD;  Location: [facility] ENDOSCOPY;  Service: Endoscopy;  Laterality: N/A;   Social History   Social History  . Marital Status: Married    Spouse Name: N/A  . Number of Children: 3  . Years of Education: N/A   Occupational History  . retired     Social History Main Topics  . Smoking status: Former Smoker -- 0.00 packs/day for 60 years    Types: Cigarettes    Quit date: 01/06/1997  . Smokeless tobacco: Never Used  . Alcohol Use: No  . Drug Use: No  . Sexual Activity: No   Other Topics Concern  . None   Social History Narrative  Lives with his wife who is disabled.  He does not use any assist device.  He mows grass, cooks, etc and cares for her.     Family History  Problem Relation Age of Onset  . Hypertension    . Coronary artery disease    . Stroke Mother   . Heart disease Mother   . Hypertension Mother   . Heart attack Mother   . Heart attack Brother   . Heart disease Brother   . Heart disease Father   . Hypertension Father   . Heart attack Father    Allergies  Allergen Reactions  . Ferrous Sulfate Nausea And Vomiting   Prior to Admission medications   Medication Sig Start Date End Date Taking? Authorizing Provider  acetaminophen (TYLENOL) 325 MG tablet Take 2 tablets (650 mg total) by mouth every 6 (six)  hours as needed for mild pain (or Fever >/= 101). 05/25/15  Yes Bonnielee Haff, MD  albuterol (PROVENTIL HFA;VENTOLIN HFA) 108 (90 BASE) MCG/ACT inhaler Inhale 2 puffs into the lungs every 4 (four) hours as needed for wheezing or shortness of breath. 06/13/14  Yes Janece Canterbury, MD  albuterol (PROVENTIL) (2.5 MG/3ML) 0.083% nebulizer solution INHALE 1 VIAL VIA NEBULIZER EVERY 6 HOURS AS NEEDED FOR WHEEZING 04/23/15  Yes Historical Provider, MD  aspirin EC 81 MG tablet Take 81 mg by mouth at bedtime.   Yes Historical Provider, MD  atorvastatin (LIPITOR) 40 MG tablet Take 1 tablet (40 mg total) by mouth daily at 6 PM. 05/25/15  Yes Bonnielee Haff, MD  budesonide-formoterol [facility]) 160-4.5 MCG/ACT inhaler Inhale 2 puffs into the lungs 2 (two) times daily.    Yes Historical Provider, MD  Cholecalciferol (VITAMIN D3) 2000 UNITS TABS Take 2,000 mg by mouth at bedtime.    Yes Historical Provider, MD  feeding supplement, ENSURE ENLIVE, (ENSURE ENLIVE) LIQD Take 237 mLs by mouth daily. 01/29/15  Yes Tasrif Ahmed, MD  guaiFENesin (MUCINEX) 600 MG 12 hr tablet Take 1 tablet (600 mg total) by mouth 2 (two) times daily as needed for cough. 05/25/15  Yes Bonnielee Haff, MD  metoprolol tartrate (LOPRESSOR) 25 MG tablet Take 25 mg by mouth 2 (two) times daily.    Yes Historical Provider, MD  mirtazapine (REMERON) 30 MG tablet Take 30 mg by mouth at bedtime.   Yes Historical Provider, MD  nitroGLYCERIN (NITROSTAT) 0.4 MG SL tablet Place 1 tablet (0.4 mg total) under the tongue every 5 (five) minutes as needed for chest pain. 06/13/14  Yes Janece Canterbury, MD  ondansetron (ZOFRAN) 4 MG tablet Take 1 tablet (4 mg total) by mouth every 6 (six) hours. 05/03/15  Yes Nat Christen, MD  OXYGEN-HELIUM IN Inhale 3 L into the lungs daily. 2 liters   Yes Historical Provider, MD  pantoprazole (PROTONIX) 40 MG tablet Take 40 mg by mouth 2 (two) times daily.   Yes Historical Provider, MD  senna-docusate (SENOKOT-S) 8.6-50 MG per tablet  Take 1 tablet by mouth at bedtime as needed for mild constipation. 01/29/15  Yes Tasrif Ahmed, MD  simethicone (MYLICON) 80 MG chewable tablet Chew 1 tablet (80 mg total) by mouth every 6 (six) hours as needed for flatulence. 01/29/15  Yes Tasrif Ahmed, MD  sucralfate (CARAFATE) 1 GM/10ML suspension Take 10 mLs (1 g total) by mouth 4 (four) times daily -  with meals and at bedtime. 05/25/15  Yes Bonnielee Haff, MD  temazepam (RESTORIL) 7.5 MG capsule Take 1 capsule (7.5 mg total) by mouth at bedtime as  needed for sleep. for sleep 05/25/15  Yes Gokul Krishnan, MD  tiotropium (SPIRIVA HANDIHALER) 18 MCG inhalation capsule Place 1 capsule (18 mcg total) into inhaler and inhale daily. 06/13/14  Yes Mackenzie Short, MD  vitamin B-12 1000 MCG tablet Take 1 tablet (1,000 mcg total) by mouth daily. 01/29/15  Yes Tasrif Ahmed, MD   Dg Chest 1 View  05/29/2015   CLINICAL DATA:  Right hip pain after a fall.  EXAM: CHEST 1 VIEW  COMPARISON:  05/21/2015  FINDINGS: Normal heart size and pulmonary vascularity. Chronic emphysematous changes with scattered fibrosis in the lungs, likely due to chronic bronchitis. Lower lung opacities likely due to atelectasis. No blunting of the right costophrenic angle. Left costophrenic angle is not included within the field of view. No pneumothorax. Degenerative changes in the spine and shoulders. Surgical clips around the GE junction. Calcified and tortuous aorta.  IMPRESSION: Emphysematous and chronic bronchitic changes in the lungs with scattered fibrosis. Atelectasis in the lung bases.   Electronically Signed   By: William  Stevens M.D.   On: 05/29/2015 02:43   Ct Head Wo Contrast  05/29/2015   CLINICAL DATA:  Patient lost his balance and fell at home this evening. No loss of consciousness.  EXAM: CT HEAD WITHOUT CONTRAST  CT CERVICAL SPINE WITHOUT CONTRAST  TECHNIQUE: Multidetector CT imaging of the head and cervical spine was performed following the standard protocol without  intravenous contrast. Multiplanar CT image reconstructions of the cervical spine were also generated.  COMPARISON:  CT head 09/04/2012  FINDINGS: CT HEAD FINDINGS  Diffuse cerebral atrophy. Patchy low-attenuation changes throughout the deep white matter consistent with small vessel ischemia. Mild ventricular dilatation consistent with central atrophy. No mass effect or midline shift. No abnormal extra-axial fluid collections. Gray-white matter junctions are distinct. Basal cisterns are not effaced. No evidence of acute intracranial hemorrhage. No depressed skull fractures. Visualized paranasal sinuses and mastoid air cells are not opacified. Vascular calcifications.  CT CERVICAL SPINE FINDINGS  Diffuse bone demineralization. Normal alignment of the cervical spine. Diffuse degenerative changes with narrowed cervical interspaces and endplate hypertrophic changes. Degenerative changes throughout the facet joints. Prominent disc osteophyte complexes at C3-4, C4-5, and C5-6 levels. Bony encroachment upon the neural foramina at multiple levels bilaterally. No vertebral compression deformities. No prevertebral soft tissue swelling. C1-2 articulation appears intact. No focal bone lesion or bone destruction. Vascular calcifications. Scarring in the lung apices with suggestion of a cavitary lesion in the right apex. This is incompletely evaluated on the current study but neoplasm is not excluded. Suggest follow-up with elective CT chest for further evaluation. Small left pleural effusion versus pleural thickening.  IMPRESSION: No acute intracranial abnormalities. Chronic atrophy and small vessel ischemic changes.  Diffuse bone demineralization. Normal alignment of the cervical spine. Diffuse degenerative changes. No acute displaced fractures identified.  Incidental note of a possible lesion in the right lung apex with irregular margins and cavitation. CT chest is suggested to exclude pulmonary neoplasm. Small left pleural  effusion versus pleural thickening.   Electronically Signed   By: William  Stevens M.D.   On: 05/29/2015 04:56   Ct Chest Wo Contrast  05/29/2015   CLINICAL DATA:  History of COPD. Recent hip fracture with "Pulmonary nodule" .  EXAM: CT CHEST WITHOUT CONTRAST  TECHNIQUE: Multidetector CT imaging of the chest was performed following the standard protocol without IV contrast.  COMPARISON:  Chest radiograph of 05/29/2015. Chest CT of 09/22/2012.  FINDINGS: Mediastinum/Nodes: Bilateral advanced carotid atherosclerosis. advanced aortic and branch   vessel atherosclerosis. Mild cardiomegaly with coronary artery atherosclerosis. AP window nodes measure up to 11 mm versus 10 mm on the prior. Hilar regions poorly evaluated without intravenous contrast. Surgical changes at the gastroesophageal junction. Fluid level in the upper esophagus, on image/series 13/201  The lung apices are partially excluded.  Lungs/Pleura: Small bilateral pleural effusions. The left-sided pleural effusion has mild loculation, including superiorly and laterally on image/series 25/201. Left lower lobe endobronchial narrowing is likely due to compression or aspiration, including on image/series 34/203. Moderate centrilobular emphysema. Bibasilar dependent airspace disease.  Patchy opacities within the right upper and both lower lobes are favored to represent areas of scarring. right upper lobe opacities have a somewhat nodular component, including image 15/203. Also, nodularity at up to 11 mm on image/series 7/203.  Within the left upper lobe, peribronchovascular reticular nodular opacities.  Mild motion degradation.  Upper abdomen: Normal imaged portions of the liver, spleen, stomach, adrenal glands.  Musculoskeletal: Osteopenia. A mild to moderate T4 compression deformity is similar. Mild compression deformity at L1 is similar.  IMPRESSION: 1. Left upper lobe pulmonary opacities are suspicious for atypical infection or aspiration. 2. Left larger  than right pleural effusions with loculation involving the left-sided effusion. 3. Centrilobular emphysema. Bibasilar airspace opacities which are likely atelectasis. Probable scarring involving the right upper lobe. Recommend follow-up CT at 3 months to confirm stability of this appearance. 4. Esophageal air fluid level suggests dysmotility or gastroesophageal reflux. 5. Chronic thoracolumbar compression deformities. 6. Mild motion degradation.   Electronically Signed   By: Kyle  Talbot M.D.   On: 05/29/2015 12:06   Ct Cervical Spine Wo Contrast  05/29/2015   CLINICAL DATA:  Patient lost his balance and fell at home this evening. No loss of consciousness.  EXAM: CT HEAD WITHOUT CONTRAST  CT CERVICAL SPINE WITHOUT CONTRAST  TECHNIQUE: Multidetector CT imaging of the head and cervical spine was performed following the standard protocol without intravenous contrast. Multiplanar CT image reconstructions of the cervical spine were also generated.  COMPARISON:  CT head 09/04/2012  FINDINGS: CT HEAD FINDINGS  Diffuse cerebral atrophy. Patchy low-attenuation changes throughout the deep white matter consistent with small vessel ischemia. Mild ventricular dilatation consistent with central atrophy. No mass effect or midline shift. No abnormal extra-axial fluid collections. Gray-white matter junctions are distinct. Basal cisterns are not effaced. No evidence of acute intracranial hemorrhage. No depressed skull fractures. Visualized paranasal sinuses and mastoid air cells are not opacified. Vascular calcifications.  CT CERVICAL SPINE FINDINGS  Diffuse bone demineralization. Normal alignment of the cervical spine. Diffuse degenerative changes with narrowed cervical interspaces and endplate hypertrophic changes. Degenerative changes throughout the facet joints. Prominent disc osteophyte complexes at C3-4, C4-5, and C5-6 levels. Bony encroachment upon the neural foramina at multiple levels bilaterally. No vertebral  compression deformities. No prevertebral soft tissue swelling. C1-2 articulation appears intact. No focal bone lesion or bone destruction. Vascular calcifications. Scarring in the lung apices with suggestion of a cavitary lesion in the right apex. This is incompletely evaluated on the current study but neoplasm is not excluded. Suggest follow-up with elective CT chest for further evaluation. Small left pleural effusion versus pleural thickening.  IMPRESSION: No acute intracranial abnormalities. Chronic atrophy and small vessel ischemic changes.  Diffuse bone demineralization. Normal alignment of the cervical spine. Diffuse degenerative changes. No acute displaced fractures identified.  Incidental note of a possible lesion in the right lung apex with irregular margins and cavitation. CT chest is suggested to exclude pulmonary neoplasm. Small left pleural   effusion versus pleural thickening.   Electronically Signed   By: William  Stevens M.D.   On: 05/29/2015 04:56   Dg Hip Unilat With Pelvis 2-3 Views Right  05/29/2015   CLINICAL DATA:  Right hip pain after a fall.  EXAM: DG HIP (WITH OR WITHOUT PELVIS) 2-3V RIGHT  COMPARISON:  None.  FINDINGS: Comminuted inter trochanteric fracture of the right hip with varus angulation of the fracture fragments and displaced lesser trochanteric fragment. No evidence of dislocation of the right hip. Degenerative changes in the lower lumbar spine and both hips. Diffuse bone demineralization. Pelvis appears intact. SI joints and symphysis pubis are not displaced. Vascular calcifications.  IMPRESSION: Acute comminuted inter trochanteric fracture of the right hip with varus angulation and displacement of lesser trochanteric fragment.   Electronically Signed   By: William  Stevens M.D.   On: 05/29/2015 03:27    Positive ROS: All other systems have been reviewed and were otherwise negative with the exception of those mentioned in the HPI and as above.  Labs cbc  Recent Labs   05/29/15 0205  WBC 9.3  HGB 9.0*  HCT 30.1*  PLT 331    Labs inflam No results for input(s): CRP in the last 72 hours.  Invalid input(s): ESR  Labs coag  Recent Labs  05/29/15 0205  INR 1.01     Recent Labs  05/29/15 0205  NA 138  K 3.3*  CL 104  CO2 27  GLUCOSE 142*  BUN 12  CREATININE 0.96  CALCIUM 8.1*    Physical Exam: Filed Vitals:   05/29/15 1300  BP: 114/62  Pulse: 76  Temp: 98.1 F (36.7 C)  Resp: 22   General: Alert, no acute distress Cardiovascular: No pedal edema Respiratory: No cyanosis, no use of accessory musculature GI: No organomegaly, abdomen is soft and non-tender Skin: No lesions in the area of chief complaint other than those listed below in MSK exam.  Neurologic: Sensation intact distally Psychiatric: Patient is competent for consent with normal mood and affect Lymphatic: No axillary or cervical lymphadenopathy  MUSCULOSKELETAL:  R hip is shortened and externally rotated while resting in bed.  Tender to palpation.  Pain with log roll.  Limited ROM due to pain.  Sensation intact with 2+ distal pulses.  Other extremities are atraumatic with painless ROM and NVI.  Assessment: R intertrochanteric hip fracture s/p fall at home one week ago  Plan: Xray findings were reviewed with the patient.  Recommending surgical correction to help with mobilization.  Risks of surgery discussed with the patient.  Patient expresses understanding and wishes to proceed with surgery.  Will plan to take to the OR tomorrow for IM nail of the R hip.  Will be bedrest until then.  NPO after midnight.   Salman Wellen Marie, PA-C Cell [phone]   05/29/2015 2:50 PM 

## 2015-05-29 NOTE — ED Provider Notes (Signed)
TIME SEEN: 3:45 AM  CHIEF COMPLAINT: fall, right hip pain  HPI: Pt is a 81 y.o. M with h/o CAD, COPD on home O2 who lives at home with "crippled" wife who presents to ED with fall tonight.  Pt reports he fell while walking with his walker in his house because he felt "dizzy".  No associated CP or new SOB.  No LOC.  Has chronic unchanged SOB.  States he is on a "blood thinner" but does not know why or which one and nothing listed in [facility].  Does not think he hit his head.  Denies back or neck pain.  Has skin tear to right elbow.  No other injury.  ROS: See HPI Constitutional: no fever  Eyes: no drainage  ENT: no runny nose   Cardiovascular:  no chest pain  Resp: no SOB  GI: no vomiting GU: no dysuria Integumentary: no rash  Allergy: no hives  Musculoskeletal: no leg swelling  Neurological: no slurred speech ROS otherwise negative  PAST MEDICAL HISTORY/PAST SURGICAL HISTORY:  Past Medical History  Diagnosis Date  . COPD (chronic obstructive pulmonary disease) (HCC)   . CAD (coronary artery disease)     a.  nonobstructive CAD by cath 1999. b. Myoview 04/2012: fixed inferior and inferoseptal bowel attenuation artifact, no reversible ischemia. EF 62%. Low risk scan.  Marland Kitchen HTN (hypertension)   . PUD (peptic ulcer disease)     a. s/p surgery 1976.  Marland Kitchen Syncope     2012  . Carotid artery disease (HCC) 03/22/13    a. 50-69% BICA by duplex 03/2013, repeat needed 11/2013.  Marland Kitchen PAD (peripheral artery disease) (HCC)     a. prior stenting of RCIA 1999. b. history of 90% vertebral artery narrowing, 40% subclavian artery narrowing. c. Diagnostic PV angio 08/2013 for progressive claudication - will ultimately need endarterectomy/patch angio on bilat CFA stenosis; will also need atherectomy/PT/stenting to REIA.   . CKD (chronic kidney disease), stage III   . HLD (hyperlipidemia)   . Chronic respiratory failure (HCC)     a. On home O2.  Marland Kitchen NSVT (nonsustained ventricular tachycardia) (HCC)     a. Brief NSVT  (6b) during 08/2013 adm.  . Stroke (HCC)   . Shortness of breath   . On supplemental oxygen therapy     2l    MEDICATIONS:  Prior to Admission medications   Medication Sig Start Date End Date Taking? Authorizing Provider  acetaminophen (TYLENOL) 325 MG tablet Take 2 tablets (650 mg total) by mouth every 6 (six) hours as needed for mild pain (or Fever >/= 101). 05/25/15   Osvaldo Shipper, MD  albuterol (PROVENTIL HFA;VENTOLIN HFA) 108 (90 BASE) MCG/ACT inhaler Inhale 2 puffs into the lungs every 4 (four) hours as needed for wheezing or shortness of breath. 06/13/14   Renae Fickle, MD  albuterol (PROVENTIL) (2.5 MG/3ML) 0.083% nebulizer solution INHALE 1 VIAL VIA NEBULIZER EVERY 6 HOURS AS NEEDED FOR WHEEZING 04/23/15   Historical Provider, MD  aspirin EC 81 MG tablet Take 81 mg by mouth at bedtime.    Historical Provider, MD  atorvastatin (LIPITOR) 40 MG tablet Take 1 tablet (40 mg total) by mouth daily at 6 PM. 05/25/15   Osvaldo Shipper, MD  budesonide-formoterol [facility]) 160-4.5 MCG/ACT inhaler Inhale 2 puffs into the lungs 2 (two) times daily.     Historical Provider, MD  Cholecalciferol (VITAMIN D3) 2000 UNITS TABS Take 2,000 mg by mouth at bedtime.     Historical Provider, MD  feeding supplement,  ENSURE ENLIVE, (ENSURE ENLIVE) LIQD Take 237 mLs by mouth daily. Patient not taking: Reported on 05/03/2015 01/29/15   Hyacinth Meeker, MD  guaiFENesin (MUCINEX) 600 MG 12 hr tablet Take 1 tablet (600 mg total) by mouth 2 (two) times daily as needed for cough. 05/25/15   Osvaldo Shipper, MD  metoprolol tartrate (LOPRESSOR) 25 MG tablet Take 25 mg by mouth 2 (two) times daily.     Historical Provider, MD  mirtazapine (REMERON) 30 MG tablet Take 30 mg by mouth at bedtime.    Historical Provider, MD  nitroGLYCERIN (NITROSTAT) 0.4 MG SL tablet Place 1 tablet (0.4 mg total) under the tongue every 5 (five) minutes as needed for chest pain. 06/13/14   Renae Fickle, MD  ondansetron (ZOFRAN) 4 MG tablet Take 1  tablet (4 mg total) by mouth every 6 (six) hours. Patient not taking: Reported on 05/21/2015 05/03/15   Donnetta Hutching, MD  OXYGEN-HELIUM IN Inhale 3 L into the lungs daily. 2 liters    Historical Provider, MD  pantoprazole (PROTONIX) 40 MG tablet Take 40 mg by mouth 2 (two) times daily.    Historical Provider, MD  senna-docusate (SENOKOT-S) 8.6-50 MG per tablet Take 1 tablet by mouth at bedtime as needed for mild constipation. Patient not taking: Reported on 05/03/2015 01/29/15   Hyacinth Meeker, MD  simethicone (MYLICON) 80 MG chewable tablet Chew 1 tablet (80 mg total) by mouth every 6 (six) hours as needed for flatulence. 01/29/15   Tasrif Ahmed, MD  sucralfate (CARAFATE) 1 GM/10ML suspension Take 10 mLs (1 g total) by mouth 4 (four) times daily -  with meals and at bedtime. 05/25/15   Osvaldo Shipper, MD  temazepam (RESTORIL) 7.5 MG capsule Take 1 capsule (7.5 mg total) by mouth at bedtime as needed for sleep. for sleep 05/25/15   Osvaldo Shipper, MD  tiotropium (SPIRIVA HANDIHALER) 18 MCG inhalation capsule Place 1 capsule (18 mcg total) into inhaler and inhale daily. 06/13/14   Renae Fickle, MD  vitamin B-12 1000 MCG tablet Take 1 tablet (1,000 mcg total) by mouth daily. 01/29/15   Hyacinth Meeker, MD    ALLERGIES:  Allergies  Allergen Reactions  . Ferrous Sulfate Nausea And Vomiting    SOCIAL HISTORY:  Social History  Substance Use Topics  . Smoking status: Former Smoker -- 0.00 packs/day for 60 years    Types: Cigarettes    Quit date: 01/06/1997  . Smokeless tobacco: Never Used  . Alcohol Use: No    FAMILY HISTORY: Family History  Problem Relation Age of Onset  . Hypertension    . Coronary artery disease    . Stroke Mother   . Heart disease Mother   . Hypertension Mother   . Heart attack Mother   . Heart attack Brother   . Heart disease Brother   . Heart disease Father   . Hypertension Father   . Heart attack Father     EXAM: BP 140/82 mmHg  Pulse 67  Temp(Src)   Resp 35  Ht   (1.778 m)  Wt 100 lb (45.36 kg)  BMI 14.35 kg/m2  SpO2 97% CONSTITUTIONAL: Alert and oriented and responds appropriately to questions. Thin, elderly, in NAD HEAD: Normocephalic; atraumatic EYES: Conjunctivae clear, PERRL, EOMI ENT: normal nose; no rhinorrhea; moist mucous membranes; pharynx without lesions noted; no dental injury; no septal hematoma NECK: Supple, no meningismus, no LAD; no midline spinal tenderness, step-off or deformity CARD: RRR; S1 and S2 appreciated; no murmurs, no clicks, no rubs, no  gallops RESP: Normal chest excursion without splinting, slightly tachypneic and diminished at bases; breath sounds clear and equal bilaterally; no wheezes, no rhonchi, no rales; no hypoxia or respiratory distress CHEST:  chest wall stable, no crepitus or ecchymosis or deformity, nontender to palpation ABD/GI: Normal bowel sounds; non-distended; soft, non-tender, no rebound, no guarding PELVIS:  stable, nontender to palpation BACK:  The back appears normal and is non-tender to palpation, there is no CVA tenderness; no midline spinal tenderness, step-off or deformity EXT: TTP over right hip with decreased ROM 2/2 pain, skin tear to right elbow, otherwise Normal ROM in all joints; otherwise non-tender to palpation; no edema; normal capillary refill; no cyanosis   SKIN: Normal color for age and race; warm NEURO: Moves all extremities equally, sensation to light touch intact diffusely, cranial nerves II through XII intact PSYCH: The patient's mood and manner are appropriate. Grooming and personal hygiene are appropriate.  MEDICAL DECISION MAKING: Pt here with fall.  Has R intertrochanteric fracture.  NPO since 9pm yesterday.  Will keep NPO and give gentle IV hydration.  Denies pain meds.  HD stable.  Labs at baseline.  CXR shows chronic changes, no PNA, no PTX.  EKG shows no new ischemic changes, NSR.  CT head and c spine show no new injury. ? Pulmonary neoplasm on CT cervical spine.  Can be  worked up while inpt.  Denies CP, SOB, fever, cough.  Will admit and d/w ortho.  ED PROGRESS:    5:25 AM  D/w Dr. Eulah Pont with ortho who will see pt in consult.  D/w Dr. Maryfrances Bunnell with medicine for admission to med/surg, inpt.    EKG Interpretation  Date/Time:  Tuesday May 29 2015 03:39:27 EDT Ventricular Rate:  64 PR Interval:  175 QRS Duration: 95 QT Interval:  531 QTC Calculation: 548 R Axis:   42 Text Interpretation:  Sinus or ectopic atrial rhythm Borderline low voltage, extremity leads Nonspecific repol abnormality, diffuse leads Prolonged QT interval No significant change since last tracing Confirmed by WARD,  DO, KRISTEN [phone]) on 05/29/2015 7:29:32 AM        Layla Maw Ward, DO 05/29/15 1232

## 2015-05-29 NOTE — ED Notes (Signed)
Ortho in with patient.

## 2015-05-29 NOTE — Consult Note (Signed)
PULMONARY / CRITICAL CARE MEDICINE   Name: Alec Walker MRN: [id number] DOB: [birth year]    ADMISSION DATE:  05/29/2015 CONSULTATION DATE:  05/29/2015  REFERRING MD :  Lendell Caprice  CHIEF COMPLAINT:  Hip fracture, pulmonary pre-operative clearance  INITIAL PRESENTATION: 81 year old male admitted on 05/29/2015 with a hip fracture. Pulmonary medicine was consulted on the same day for perioperative risk stratification.  STUDIES:  05/29/2015 head CT C-spine CT> no acute intracranial abnormality, diffuse bone demineralization, no displaced fractures noted, there is suggestion of a right apex nodule with irregular margins and cavitation, CT chest recommended  SIGNIFICANT EVENTS:   HISTORY OF PRESENT ILLNESS:  This is an 81 year old male with a past medical history significant for COPD who was admitted to [facility] on 05/29/2015 after a hip fracture. Pulmonary medicine was consulted for perioperative risk stratification. He tells me that despite his recent fall and hip fracture with some pain he is not having breathing difficulty. He was hospitalized for COPD exacerbation one year ago but he says that he has not had to go back to the physician's office for an acute flareup of his COPD. His primary care physician manages his COPD for which she takes Spiriva and Symbicort. He uses 3 L of oxygen continuously and this has not changed recently. He has never seen a pulmonologist. He had a pulmonary function test in 2002 but he has not had one since then. He is breathing comfortably today.  PAST MEDICAL HISTORY :   has a past medical history of COPD (chronic obstructive pulmonary disease) (HCC); CAD (coronary artery disease); HTN (hypertension); PUD (peptic ulcer disease); Syncope; Carotid artery disease (HCC) (03/22/13); PAD (peripheral artery disease) (HCC); CKD (chronic kidney disease), stage III; HLD (hyperlipidemia); Chronic respiratory failure (HCC); NSVT (nonsustained ventricular  tachycardia) (HCC); Stroke [facility]); Shortness of breath; and On supplemental oxygen therapy.  has past surgical history that includes Cataract extraction; Gastrectomy; Transurethral resection of prostate; Coronary angioplasty with stent; Endarterectomy femoral (Bilateral, 11/11/2013); lower extremity angiogram (N/A, 09/01/2013); and Esophagogastroduodenoscopy (N/A, 05/23/2015). Prior to Admission medications   Medication Sig Start Date End Date Taking? Authorizing Provider  acetaminophen (TYLENOL) 325 MG tablet Take 2 tablets (650 mg total) by mouth every 6 (six) hours as needed for mild pain (or Fever >/= 101). 05/25/15   Osvaldo Shipper, MD  albuterol (PROVENTIL HFA;VENTOLIN HFA) 108 (90 BASE) MCG/ACT inhaler Inhale 2 puffs into the lungs every 4 (four) hours as needed for wheezing or shortness of breath. 06/13/14   Renae Fickle, MD  albuterol (PROVENTIL) (2.5 MG/3ML) 0.083% nebulizer solution INHALE 1 VIAL VIA NEBULIZER EVERY 6 HOURS AS NEEDED FOR WHEEZING 04/23/15   Historical Provider, MD  aspirin EC 81 MG tablet Take 81 mg by mouth at bedtime.    Historical Provider, MD  atorvastatin (LIPITOR) 40 MG tablet Take 1 tablet (40 mg total) by mouth daily at 6 PM. 05/25/15   Osvaldo Shipper, MD  budesonide-formoterol [facility]) 160-4.5 MCG/ACT inhaler Inhale 2 puffs into the lungs 2 (two) times daily.     Historical Provider, MD  Cholecalciferol (VITAMIN D3) 2000 UNITS TABS Take 2,000 mg by mouth at bedtime.     Historical Provider, MD  feeding supplement, ENSURE ENLIVE, (ENSURE ENLIVE) LIQD Take 237 mLs by mouth daily. Patient not taking: Reported on 05/03/2015 01/29/15   Hyacinth Meeker, MD  guaiFENesin (MUCINEX) 600 MG 12 hr tablet Take 1 tablet (600 mg total) by mouth 2 (two) times daily as needed for cough. 05/25/15   Osvaldo Shipper, MD  metoprolol tartrate (LOPRESSOR) 25 MG tablet Take 25 mg by mouth 2 (two) times daily.     Historical Provider, MD  mirtazapine (REMERON) 30 MG tablet Take 30 mg by mouth at  bedtime.    Historical Provider, MD  nitroGLYCERIN (NITROSTAT) 0.4 MG SL tablet Place 1 tablet (0.4 mg total) under the tongue every 5 (five) minutes as needed for chest pain. 06/13/14   Renae Fickle, MD  ondansetron (ZOFRAN) 4 MG tablet Take 1 tablet (4 mg total) by mouth every 6 (six) hours. Patient not taking: Reported on 05/21/2015 05/03/15   Donnetta Hutching, MD  OXYGEN-HELIUM IN Inhale 3 L into the lungs daily. 2 liters    Historical Provider, MD  pantoprazole (PROTONIX) 40 MG tablet Take 40 mg by mouth 2 (two) times daily.    Historical Provider, MD  senna-docusate (SENOKOT-S) 8.6-50 MG per tablet Take 1 tablet by mouth at bedtime as needed for mild constipation. Patient not taking: Reported on 05/03/2015 01/29/15   Hyacinth Meeker, MD  simethicone (MYLICON) 80 MG chewable tablet Chew 1 tablet (80 mg total) by mouth every 6 (six) hours as needed for flatulence. 01/29/15   Tasrif Ahmed, MD  sucralfate (CARAFATE) 1 GM/10ML suspension Take 10 mLs (1 g total) by mouth 4 (four) times daily -  with meals and at bedtime. 05/25/15   Osvaldo Shipper, MD  temazepam (RESTORIL) 7.5 MG capsule Take 1 capsule (7.5 mg total) by mouth at bedtime as needed for sleep. for sleep 05/25/15   Osvaldo Shipper, MD  tiotropium (SPIRIVA HANDIHALER) 18 MCG inhalation capsule Place 1 capsule (18 mcg total) into inhaler and inhale daily. 06/13/14   Renae Fickle, MD  vitamin B-12 1000 MCG tablet Take 1 tablet (1,000 mcg total) by mouth daily. 01/29/15   Hyacinth Meeker, MD   Allergies  Allergen Reactions  . Ferrous Sulfate Nausea And Vomiting    FAMILY HISTORY:  indicated that his mother is deceased. He indicated that his father is deceased. He indicated that his brother is deceased.  SOCIAL HISTORY:  reports that he quit smoking about 18 years ago. His smoking use included Cigarettes. He smoked 0.00 packs per day for 60 years. He has never used smokeless tobacco. He reports that he does not drink alcohol or use illicit  drugs.  REVIEW OF SYSTEMS:   Gen: Denies fever, chills, weight change, fatigue, night sweats HEENT: Denies blurred vision, double vision, hearing loss, tinnitus, sinus congestion, rhinorrhea, sore throat, neck stiffness, dysphagia PULM: per HPI CV: Denies chest pain, edema, orthopnea, paroxysmal nocturnal dyspnea, palpitations GI: Denies abdominal pain, nausea, vomiting, diarrhea, hematochezia, melena, constipation, change in bowel habits GU: Denies dysuria, hematuria, polyuria, oliguria, urethral discharge Endocrine: Denies hot or cold intolerance, polyuria, polyphagia or appetite change Derm: Denies rash, dry skin, scaling or peeling skin change Heme: Denies easy bruising, bleeding, bleeding gums Neuro: Denies headache, numbness, weakness, slurred speech, loss of memory or consciousness   SUBJECTIVE:   VITAL SIGNS: Temp:  [97.9 F (36.6 C)] 97.9 F (36.6 C) (10/11 0846) Pulse Rate:  [62-73] 69 (10/11 0846) Resp:  [16-35] 24 (10/11 0846) BP: (100-157)/(45-82) 122/48 mmHg (10/11 0846) SpO2:  [95 %-100 %] 100 % (10/11 0846) Weight:  [45.36 kg (100 lb)] 45.36 kg (100 lb) (10/11 0146) HEMODYNAMICS:   VENTILATOR SETTINGS:   INTAKE / OUTPUT: No intake or output data in the 24 hours ending 05/29/15 0944  PHYSICAL EXAMINATION: General:  Frail, cachectic male in no distress in bed Neuro:  Awake alert, oriented 4, moves  all 4 x-rays well HEENT:  Normocephalic atraumatic, oropharynx clear Cardiovascular:  Regular rate and rhythm on my exam, no audible murmurs Lungs:  Clear to auscultation bilaterally without wheezing Abdomen:  Bowel sounds positive, nontender nondistended Musculoskeletal:  Obvious bony deformity right hip, Skin:  Multiple surgical scars, well-healed, no rash or skin breakdown  LABS:  CBC  Recent Labs Lab 05/24/15 0435 05/29/15 0205  WBC 10.1 9.3  HGB 9.0* 9.0*  HCT 29.1* 30.1*  PLT 298 331   Coag's  Recent Labs Lab 05/29/15 0205  APTT 37  INR 1.01    BMET  Recent Labs Lab 05/23/15 0322 05/24/15 0435 05/29/15 0205  NA 139 141 138  K 3.8 3.5 3.3*  CL 110 112* 104  CO2 22 21* 27  BUN CREATININE 0.98 0.86 0.96  GLUCOSE 106* 94 142*   Electrolytes  Recent Labs Lab 05/23/15 0322 05/24/15 0435 05/29/15 0205  CALCIUM 7.8* 7.9* 8.1*   Sepsis Markers No results for input(s): LATICACIDVEN, PROCALCITON, O2SATVEN in the last 168 hours. ABG No results for input(s): PHART, PCO2ART, PO2ART in the last 168 hours. Liver Enzymes  Recent Labs Lab 05/29/15 0205  AST 14*  ALT 11*  ALKPHOS 92  BILITOT 0.2*  ALBUMIN 1.8*   Cardiac Enzymes  Recent Labs Lab 05/22/15 1100  TROPONINI 0.04*   Glucose  Recent Labs Lab 05/23/15 0553 05/24/15 0705 05/25/15 0553 05/25/15 0637  GLUCAP 85 88 73 85    Imaging  05/29/2015 CT of head and spine images personally reviewed showing a cavitary nodule in the right upper lobe with irregular borders  Other data: Pulmonary function testing from 2002 reviewed, see below  Endoscopy note from last week was reviewed where he had no respiratory complication from sedation   ASSESSMENT / PLAN:  PULMONARY  A: Severe COPD, FEV1 in 2002 was 1.94 L (62% predicted), on 3 L of oxygen at baseline. He has a history of one exacerbation requiring hospitalization a year ago but he has not had an exacerbation since. Notably, he tolerated endoscopy approximately one week ago without respiratory complication. I explained to him that because he has severe COPD he is at increased risk for having a perioperative pulmonary complications such as respiratory failure, pneumonia, or a COPD exacerbation. However, considering the fact that this is an extrathoracic procedure risk stratification models would predict his risk of having a perioperative pulmonary complication at less than 10%. He understands this and is willing to proceed with surgery.  Pulmonary nodule, incidental finding seen on CT spine,  needs further characterization with CT chest P:   Continue Spiriva and Symbicort Out of bed as soon as possible after surgery Continue oxygen as needed to maintain O2 saturation greater than 92% Will order CT scan without contrast  CARDIOVASCULAR A: Known coronary artery disease, peripheral vascular disease, and carotid vascular disease P:  Care per hospitalist service    Heber S[place], MD [place] [facility] Pager: [phone] Cell: [phone] After 3pm or if no response, call [phone]   05/29/2015, 9:44 AM

## 2015-05-30 ENCOUNTER — Inpatient Hospital Stay (HOSPITAL_COMMUNITY): Payer: Medicare Other

## 2015-05-30 ENCOUNTER — Inpatient Hospital Stay (HOSPITAL_COMMUNITY): Payer: Medicare Other | Admitting: Anesthesiology

## 2015-05-30 ENCOUNTER — Encounter (HOSPITAL_COMMUNITY)
Admission: EM | Disposition: A | Discharge status: Skilled Nursing Facility | Payer: Self-pay | Source: Home / Self Care | Attending: Internal Medicine

## 2015-05-30 ENCOUNTER — Encounter (HOSPITAL_COMMUNITY): Payer: Self-pay | Admitting: Anesthesiology

## 2015-05-30 DIAGNOSIS — D62 Acute posthemorrhagic anemia: Secondary | ICD-10-CM | POA: Diagnosis present

## 2015-05-30 DIAGNOSIS — R339 Retention of urine, unspecified: Secondary | ICD-10-CM | POA: Clinically undetermined

## 2015-05-30 DIAGNOSIS — J90 Pleural effusion, not elsewhere classified: Secondary | ICD-10-CM

## 2015-05-30 DIAGNOSIS — J9611 Chronic respiratory failure with hypoxia: Secondary | ICD-10-CM

## 2015-05-30 HISTORY — PX: INTRAMEDULLARY (IM) NAIL INTERTROCHANTERIC: SHX5875

## 2015-05-30 LAB — VITAMIN B12: VITAMIN B 12: 3687 pg/mL — AB (ref 180–914)

## 2015-05-30 LAB — BASIC METABOLIC PANEL
ANION GAP: 9 (ref 5–15)
BUN: 8 mg/dL (ref 6–20)
CHLORIDE: 108 mmol/L (ref 101–111)
CO2: 25 mmol/L (ref 22–32)
Calcium: 7.7 mg/dL — ABNORMAL LOW (ref 8.9–10.3)
Creatinine, Ser: 0.94 mg/dL (ref 0.61–1.24)
GFR calc non Af Amer: >60 mL/min (ref >60)
Glucose, Bld: 85 mg/dL (ref 65–99)
POTASSIUM: 3.6 mmol/L (ref 3.5–5.1)
SODIUM: 142 mmol/L (ref 135–145)

## 2015-05-30 LAB — CBC
HCT: 22 % — ABNORMAL LOW (ref 39.0–52.0)
HEMOGLOBIN: 6.8 g/dL — AB (ref 13.0–17.0)
MCH: 27.6 pg (ref 26.0–34.0)
MCHC: 30.9 g/dL (ref 30.0–36.0)
MCV: 89.4 fL (ref 78.0–100.0)
Platelets: 234 10*3/uL (ref 150–400)
RBC: 2.46 MIL/uL — AB (ref 4.22–5.81)
RDW: 15.2 % (ref 11.5–15.5)
WBC: 9.8 10*3/uL (ref 4.0–10.5)

## 2015-05-30 LAB — PREPARE RBC (CROSSMATCH)

## 2015-05-30 SURGERY — FIXATION, FRACTURE, INTERTROCHANTERIC, WITH INTRAMEDULLARY ROD
Anesthesia: General | Site: Hip | Laterality: Right

## 2015-05-30 MED ORDER — 0.9 % SODIUM CHLORIDE (POUR BTL) OPTIME
TOPICAL | Status: DC | PRN
  Administered 2015-05-30: 1000 mL

## 2015-05-30 MED ORDER — HYDROMORPHONE HCL 1 MG/ML IJ SOLN: 0.2500 mg | INTRAMUSCULAR | Status: DC | PRN

## 2015-05-30 MED ORDER — LACTATED RINGERS IV SOLN
INTRAVENOUS | Status: DC | PRN
  Administered 2015-05-30: 12:00:00 via INTRAVENOUS

## 2015-05-30 MED ORDER — SUCCINYLCHOLINE CHLORIDE 20 MG/ML IJ SOLN
INTRAMUSCULAR | Status: AC
  Filled 2015-05-30: qty 1

## 2015-05-30 MED ORDER — NITROGLYCERIN 0.4 MG SL SUBL: 0.4000 mg | SUBLINGUAL_TABLET | SUBLINGUAL | Status: DC | PRN

## 2015-05-30 MED ORDER — ASPIRIN EC 325 MG PO TBEC
325.0000 mg | DELAYED_RELEASE_TABLET | Freq: Every day | ORAL | Status: DC
  Administered 2015-05-31 – 2015-06-01 (×2): 325 mg via ORAL
  Filled 2015-05-30 (×2): qty 1

## 2015-05-30 MED ORDER — ETOMIDATE 2 MG/ML IV SOLN
INTRAVENOUS | Status: AC
  Filled 2015-05-30: qty 10

## 2015-05-30 MED ORDER — ACETAMINOPHEN 650 MG RE SUPP: 650.0000 mg | Freq: Four times a day (QID) | RECTAL | Status: DC | PRN

## 2015-05-30 MED ORDER — ROCURONIUM BROMIDE 100 MG/10ML IV SOLN
INTRAVENOUS | Status: DC | PRN
  Administered 2015-05-30: 20 mg via INTRAVENOUS

## 2015-05-30 MED ORDER — ONDANSETRON HCL 4 MG/2ML IJ SOLN
INTRAMUSCULAR | Status: DC | PRN
  Administered 2015-05-30: 4 mg via INTRAVENOUS

## 2015-05-30 MED ORDER — ETOMIDATE 2 MG/ML IV SOLN
INTRAVENOUS | Status: DC | PRN
  Administered 2015-05-30: 10 mg via INTRAVENOUS

## 2015-05-30 MED ORDER — ARTIFICIAL TEARS OP OINT
TOPICAL_OINTMENT | OPHTHALMIC | Status: DC | PRN
  Administered 2015-05-30: 1 via OPHTHALMIC

## 2015-05-30 MED ORDER — METOCLOPRAMIDE HCL 5 MG PO TABS: 5.0000 mg | ORAL_TABLET | Freq: Three times a day (TID) | ORAL | Status: DC | PRN

## 2015-05-30 MED ORDER — SODIUM CHLORIDE 0.9 % IV SOLN
10.0000 mg | INTRAVENOUS | Status: DC | PRN
  Administered 2015-05-30: 5 ug/min via INTRAVENOUS

## 2015-05-30 MED ORDER — GLYCOPYRROLATE 0.2 MG/ML IJ SOLN
INTRAMUSCULAR | Status: DC | PRN
  Administered 2015-05-30: 0.3 mg via INTRAVENOUS

## 2015-05-30 MED ORDER — PROMETHAZINE HCL 25 MG/ML IJ SOLN: 6.2500 mg | INTRAMUSCULAR | Status: DC | PRN

## 2015-05-30 MED ORDER — CEFAZOLIN SODIUM-DEXTROSE 2-3 GM-% IV SOLR
2.0000 g | Freq: Four times a day (QID) | INTRAVENOUS | Status: AC
  Administered 2015-05-30 (×2): 2 g via INTRAVENOUS
  Filled 2015-05-30 (×2): qty 50

## 2015-05-30 MED ORDER — ONDANSETRON HCL 4 MG PO TABS: 4.0000 mg | ORAL_TABLET | Freq: Four times a day (QID) | ORAL | Status: DC | PRN

## 2015-05-30 MED ORDER — SODIUM CHLORIDE 0.9 % IV SOLN
INTRAVENOUS | Status: DC | PRN
  Administered 2015-05-30: 12:00:00 via INTRAVENOUS

## 2015-05-30 MED ORDER — ACETAMINOPHEN 325 MG PO TABS
650.0000 mg | ORAL_TABLET | Freq: Four times a day (QID) | ORAL | Status: DC | PRN
  Administered 2015-06-02: 650 mg via ORAL
  Filled 2015-05-30: qty 2

## 2015-05-30 MED ORDER — ONDANSETRON HCL 4 MG/2ML IJ SOLN
4.0000 mg | Freq: Four times a day (QID) | INTRAMUSCULAR | Status: DC | PRN
  Administered 2015-06-02: 4 mg via INTRAVENOUS
  Filled 2015-05-30: qty 2

## 2015-05-30 MED ORDER — MENTHOL 3 MG MT LOZG: 1.0000 | LOZENGE | OROMUCOSAL | Status: DC | PRN

## 2015-05-30 MED ORDER — VITAMIN B-12 1000 MCG PO TABS
1000.0000 ug | ORAL_TABLET | Freq: Every day | ORAL | Status: DC
  Administered 2015-05-31 – 2015-06-02 (×3): 1000 ug via ORAL
  Filled 2015-05-30 (×3): qty 1

## 2015-05-30 MED ORDER — HYDROCODONE-ACETAMINOPHEN 5-325 MG PO TABS: 1.0000 | ORAL_TABLET | Freq: Four times a day (QID) | ORAL | Status: AC | PRN

## 2015-05-30 MED ORDER — SUCCINYLCHOLINE CHLORIDE 20 MG/ML IJ SOLN
INTRAMUSCULAR | Status: DC | PRN
  Administered 2015-05-30: 100 mg via INTRAVENOUS

## 2015-05-30 MED ORDER — PHENOL 1.4 % MT LIQD
1.0000 | OROMUCOSAL | Status: DC | PRN
Start: 2015-05-30 — End: 2015-06-02

## 2015-05-30 MED ORDER — SODIUM CHLORIDE 0.9 % IV SOLN
Freq: Once | INTRAVENOUS | Status: AC
  Administered 2015-05-30: 10:00:00 via INTRAVENOUS

## 2015-05-30 MED ORDER — FENTANYL CITRATE (PF) 100 MCG/2ML IJ SOLN
INTRAMUSCULAR | Status: DC | PRN
Start: 2015-05-30 — End: 2015-05-31
  Administered 2015-05-30 (×5): 50 ug via INTRAVENOUS

## 2015-05-30 MED ORDER — METOCLOPRAMIDE HCL 5 MG/ML IJ SOLN
5.0000 mg | Freq: Three times a day (TID) | INTRAMUSCULAR | Status: DC | PRN
  Administered 2015-05-31: 10 mg via INTRAVENOUS
  Filled 2015-05-30: qty 2

## 2015-05-30 MED ORDER — ASPIRIN 325 MG PO TABS: 325.0000 mg | ORAL_TABLET | Freq: Every day | ORAL | Status: DC

## 2015-05-30 MED ORDER — TEMAZEPAM 7.5 MG PO CAPS
7.5000 mg | ORAL_CAPSULE | Freq: Every evening | ORAL | Status: DC | PRN
  Administered 2015-05-30 – 2015-06-01 (×2): 7.5 mg via ORAL
  Filled 2015-05-30 (×2): qty 1

## 2015-05-30 MED ORDER — NEOSTIGMINE METHYLSULFATE 10 MG/10ML IV SOLN
INTRAVENOUS | Status: DC | PRN
  Administered 2015-05-30: 2.5 mg via INTRAVENOUS

## 2015-05-30 MED ORDER — FENTANYL CITRATE (PF) 250 MCG/5ML IJ SOLN
INTRAMUSCULAR | Status: AC
  Filled 2015-05-30: qty 5

## 2015-05-30 SURGICAL SUPPLY — 45 items
BIT DRILL AO GAMMA 4.2X180 (BIT) ×3 IMPLANT
BNDG COHESIVE 4X5 TAN STRL (GAUZE/BANDAGES/DRESSINGS) ×3 IMPLANT
BNDG GAUZE ELAST 4 BULKY (GAUZE/BANDAGES/DRESSINGS) ×3 IMPLANT
COVER PERINEAL POST (MISCELLANEOUS) ×3 IMPLANT
COVER SURGICAL LIGHT HANDLE (MISCELLANEOUS) ×3 IMPLANT
DRAPE STERI IOBAN 125X83 (DRAPES) ×3 IMPLANT
DRSG MEPILEX BORDER 4X4 (GAUZE/BANDAGES/DRESSINGS) ×9 IMPLANT
DURAPREP 26ML APPLICATOR (WOUND CARE) ×3 IMPLANT
ELECT REM PT RETURN 9FT ADLT (ELECTROSURGICAL)
ELECTRODE REM PT RTRN 9FT ADLT (ELECTROSURGICAL) IMPLANT
GLOVE BIO SURGEON STRL SZ7 (GLOVE) ×6 IMPLANT
GLOVE BIO SURGEON STRL SZ7.5 (GLOVE) ×3 IMPLANT
GLOVE BIOGEL PI IND STRL 6.5 (GLOVE) ×2 IMPLANT
GLOVE BIOGEL PI IND STRL 7.0 (GLOVE) ×2 IMPLANT
GLOVE BIOGEL PI IND STRL 8 (GLOVE) ×1 IMPLANT
GLOVE BIOGEL PI INDICATOR 6.5 (GLOVE) ×4
GLOVE BIOGEL PI INDICATOR 7.0 (GLOVE) ×4
GLOVE BIOGEL PI INDICATOR 8 (GLOVE) ×2
GLOVE SURG SS PI 7.0 STRL IVOR (GLOVE) ×3 IMPLANT
GOWN STRL REUS W/ TWL LRG LVL3 (GOWN DISPOSABLE) ×2 IMPLANT
GOWN STRL REUS W/ TWL XL LVL3 (GOWN DISPOSABLE) ×1 IMPLANT
GOWN STRL REUS W/TWL LRG LVL3 (GOWN DISPOSABLE) ×6
GOWN STRL REUS W/TWL XL LVL3 (GOWN DISPOSABLE) ×3
GUIDEROD T2 3X1000 (ROD) ×3 IMPLANT
K-WIRE  3.2X450M STR (WIRE) ×2
K-WIRE 3.2X450M STR (WIRE) ×1
KIT ROOM TURNOVER OR (KITS) ×3 IMPLANT
KWIRE 3.2X450M STR (WIRE) ×1 IMPLANT
MANIFOLD NEPTUNE II (INSTRUMENTS) IMPLANT
NAIL LONG KIT TIGHR 10X400-125 (Nail) ×3 IMPLANT
NS IRRIG 1000ML POUR BTL (IV SOLUTION) ×3 IMPLANT
PACK GENERAL/GYN (CUSTOM PROCEDURE TRAY) ×3 IMPLANT
PAD ARMBOARD 7.5X6 YLW CONV (MISCELLANEOUS) ×3 IMPLANT
PAD CAST 4YDX4 CTTN HI CHSV (CAST SUPPLIES) ×1 IMPLANT
PADDING CAST COTTON 4X4 STRL (CAST SUPPLIES) ×3
SCREW LAG GAMMA 3 95MM (Screw) ×3 IMPLANT
SCREW LOCKING T2 F/T  5X42.5MM (Screw) ×2 IMPLANT
SCREW LOCKING T2 F/T 5X42.5MM (Screw) ×1 IMPLANT
SUT ETHILON 3 0 PS 1 (SUTURE) ×3 IMPLANT
SUT MNCRL AB 4-0 PS2 18 (SUTURE) IMPLANT
SUT MON AB 2-0 CT1 27 (SUTURE) ×3 IMPLANT
SUT VIC AB 0 CT1 27 (SUTURE) ×3
SUT VIC AB 0 CT1 27XBRD ANBCTR (SUTURE) ×1 IMPLANT
TOWEL OR 17X24 6PK STRL BLUE (TOWEL DISPOSABLE) ×3 IMPLANT
TOWEL OR 17X26 10 PK STRL BLUE (TOWEL DISPOSABLE) ×3 IMPLANT

## 2015-05-30 NOTE — Progress Notes (Signed)
   05/30/15 1542  Clinical Encounter Type  Visited With Patient;Health care provider;Patient not available  Visit Type Initial   Chaplain attempted to complete a consult. In the morning, patient was already taken to surgery. In the afternoon, patient was unavailable due to anesthesia and wanting to rest. Chaplain support available as needed.   Alda Ponderdam M Amaree Loisel, Chaplain 05/30/2015 3:43 PM

## 2015-05-30 NOTE — Interval H&P Note (Signed)
History and Physical Interval Note:  05/30/2015 11:56 AM  Alec Walker  has presented today for surgery, with the diagnosis of Right Intertroch Fracture  The various methods of treatment have been discussed with the patient and family. After consideration of risks, benefits and other options for treatment, the patient has consented to  Procedure(s): INTRAMEDULLARY (IM) NAIL INTERTROCHANTRIC (Right) as a surgical intervention .  The patient's history has been reviewed, patient examined, no change in status, stable for surgery.  I have reviewed the patient's chart and labs.  Questions were answered to the patient's satisfaction.     Jionni Helming D

## 2015-05-30 NOTE — Anesthesia Procedure Notes (Signed)
Procedure Name: Intubation Date/Time: 05/30/2015 12:42 PM Performed by: Alec KearnsFOLEY, Alec Walker Pre-anesthesia Checklist: Patient identified, Emergency Drugs available, Suction available, Patient being monitored and Timeout performed Patient Re-evaluated:Patient Re-evaluated prior to inductionOxygen Delivery Method: Circle system utilized Preoxygenation: Pre-oxygenation with 100% oxygen Intubation Type: IV induction and Cricoid Pressure applied Ventilation: Mask ventilation without difficulty Laryngoscope Size: Mac and 3 Grade View: Grade I Tube type: Oral Number of attempts: 1 Airway Equipment and Method: Stylet Placement Confirmation: ETT inserted through vocal cords under direct vision,  positive ETCO2 and breath sounds checked- equal and bilateral Secured at: 22 cm Tube secured with: Tape Dental Injury: Teeth and Oropharynx as per pre-operative assessment

## 2015-05-30 NOTE — Progress Notes (Signed)
CRITICAL VALUE ALERT  Critical value received:  Hgb 6.8  Date of notification:  05/30/15  Time of notification:  0800  Critical value read back: yes  Nurse who received alert:  Dennard NipBrittany Scott  MD notified (1st page): Paris LoreSullivan, C  Time of first page:  0809  MD notified (2nd page):  Time of second page:  Responding MD:  Paris LoreSullivan, C  Time MD responded:  [phone]

## 2015-05-30 NOTE — Progress Notes (Signed)
Utilization review completed.  

## 2015-05-30 NOTE — Progress Notes (Signed)
Patient complaining of SOB, rate of blood slowed to 120.

## 2015-05-30 NOTE — Progress Notes (Signed)
Patient is receiving blood per order. Due to surgery, Anesthesiologist ordered nurse to run blood at a rate of 200 via telephone. Surgery is scheduled for noon. Patient will go down with first unit administering, and the second unit of blood will be administered in short stay.

## 2015-05-30 NOTE — Progress Notes (Signed)
SLP Cancellation Note  Patient Details Name: Alec HorsfallJohnie E Walker MRN: [id number] DOB: February 07, 1927   Cancelled treatment:       Reason Eval/Treat Not Completed: Medical issues which prohibited therapy - per chart, pt is NPO pending surgery today. Will f/u as able.   Alec HamLaura Walker, M.A. CCC-SLP [phone]  Alec Hamaiewonsky, Alec Walker 05/30/2015, 9:55 AM

## 2015-05-30 NOTE — Anesthesia Preprocedure Evaluation (Signed)
Anesthesia Evaluation  Patient identified by MRN, date of birth, ID band Patient awake    Reviewed: Allergy & Precautions, NPO status , Patient's Chart, lab work & pertinent test results  Airway Mallampati: II  TM Distance: >3 FB Neck ROM: Full    Dental no notable dental hx.    Pulmonary COPD,  COPD inhaler and oxygen dependent, former smoker,    Pulmonary exam normal breath sounds clear to auscultation       Cardiovascular hypertension, Pt. on medications + CAD, + Peripheral Vascular Disease and + DOE  Normal cardiovascular exam Rhythm:Regular Rate:Normal  Myoview 04/2012: fixed inferior and inferoseptal bowel attenuation artifact, no reversible ischemia. EF 62%. Low risk scan.   Neuro/Psych negative neurological ROS  negative psych ROS   GI/Hepatic negative GI ROS, Neg liver ROS,   Endo/Other  negative endocrine ROS  Renal/GU negative Renal ROS  negative genitourinary   Musculoskeletal negative musculoskeletal ROS (+)   Abdominal   Peds negative pediatric ROS (+)  Hematology  (+) anemia ,   Anesthesia Other Findings   Reproductive/Obstetrics negative OB ROS                             Anesthesia Physical Anesthesia Plan  ASA: III  Anesthesia Plan: General   Post-op Pain Management:    Induction: Intravenous  Airway Management Planned: Oral ETT  Additional Equipment:   Intra-op Plan:   Post-operative Plan: Possible Post-op intubation/ventilation  Informed Consent: I have reviewed the patients History and Physical, chart, labs and discussed the procedure including the risks, benefits and alternatives for the proposed anesthesia with the patient or authorized representative who has indicated his/her understanding and acceptance.   Dental advisory given  Plan Discussed with: CRNA and Surgeon  Anesthesia Plan Comments: (Needs preop transfusion of 2 units PRBC's)         Anesthesia Quick Evaluation

## 2015-05-30 NOTE — Progress Notes (Signed)
PULMONARY / CRITICAL CARE MEDICINE   Name: Alec Walker MRN: [id number] DOB: [birth year]    ADMISSION DATE:  05/29/2015 CONSULTATION DATE:  05/29/2015  REFERRING MD :  Lendell Caprice  CHIEF COMPLAINT:  Hip fracture, pulmonary pre-operative clearance  INITIAL PRESENTATION: 81 year old male admitted on 05/29/2015 with a hip fracture. Pulmonary medicine was consulted on the same day for perioperative risk stratification.  STUDIES:  05/29/2015 head CT C-spine CT> no acute intracranial abnormality, diffuse bone demineralization, no displaced fractures noted, there is suggestion of a right apex nodule with irregular margins and cavitation, CT chest recommended 05/29/2015 CT chest > right upper lobe nodule 10.8 mm with nearby reticulation likely representing a scar, moderate emphysema, bilateral effusions, left > right, the left is loculated, there is bronchiectasis in the left lung with tree-in-bud abnormalities, small nodules, and mucus plugging, the esophagus is patulous  SIGNIFICANT EVENTS:   SUBJECTIVE:  Had a blood transfusion overnight  VITAL SIGNS: Temp:  [98.1 F (36.7 C)-99 F (37.2 C)] 98.4 F (36.9 C) (10/12 0945) Pulse Rate:  [75-87] 75 (10/12 0945) Resp:  [20-22] 20 (10/12 0945) BP: (98-114)/(50-62) 107/50 mmHg (10/12 0945) SpO2:  [97 %-100 %] 98 % (10/12 0945) HEMODYNAMICS:   VENTILATOR SETTINGS:   INTAKE / OUTPUT:  Intake/Output Summary (Last 24 hours) at 05/30/15 0956 Last data filed at 05/30/15 0600  Gross per 24 hour  Intake      0 ml  Output    875 ml  Net   -875 ml    PHYSICAL EXAMINATION: General:  Frail, cachectic male in no distress in bed Neuro:  Awake alert, oriented 4, moves all 4 x-rays well HEENT:  Normocephalic atraumatic, oropharynx clear Cardiovascular:  Regular rate and rhythm on my exam, no audible murmurs Lungs:  Clear to auscultation bilaterally without wheezing Abdomen:  Bowel sounds positive, nontender nondistended Musculoskeletal:   Obvious bony deformity right hip, Skin:  Multiple surgical scars, well-healed, no rash or skin breakdown  LABS:  CBC  Recent Labs Lab 05/24/15 0435 05/29/15 0205 05/30/15 0655  WBC 10.1 9.3 9.8  HGB 9.0* 9.0* 6.8*  HCT 29.1* 30.1* 22.0*  PLT 298 331 234   Coag's  Recent Labs Lab 05/29/15 0205  APTT 37  INR 1.01   BMET  Recent Labs Lab 05/24/15 0435 05/29/15 0205 05/30/15 0655  NA 141 138 142  K 3.5 3.3* 3.6  CL 112* 104 108  CO2 21* 27 25  BUN CREATININE 0.86 0.96 0.94  GLUCOSE 94 142* 85   Electrolytes  Recent Labs Lab 05/24/15 0435 05/29/15 0205 05/30/15 0655  CALCIUM 7.9* 8.1* 7.7*   Sepsis Markers No results for input(s): LATICACIDVEN, PROCALCITON, O2SATVEN in the last 168 hours. ABG No results for input(s): PHART, PCO2ART, PO2ART in the last 168 hours. Liver Enzymes  Recent Labs Lab 05/29/15 0205  AST 14*  ALT 11*  ALKPHOS 92  BILITOT 0.2*  ALBUMIN 1.8*   Cardiac Enzymes No results for input(s): TROPONINI, PROBNP in the last 168 hours. Glucose  Recent Labs Lab 05/24/15 0705 05/25/15 0553 05/25/15 0637  GLUCAP 88 73 85    Imaging  05/29/2015 CT of head and spine images personally reviewed showing a cavitary nodule in the right upper lobe with irregular borders  10/11 CT chest reviewed> see my description above   ASSESSMENT / PLAN:  PULMONARY  A: Severe COPD, FEV1 in 2002 was 1.94 L (62% predicted), on 3 L of oxygen at baseline and not in exacerbation.  See  10/11 note for peri-operative risk stratification note.  Pulmonary nodule, right upper lobe favored to represent scarring but needs follow up  CT imaging of chest suggestive of chronic aspiration (I favor given patulous esophagus) vs atypical infection with bronchiectasis  Pleural effusion> new problem, loculated on left likely due to recurrent and chronic aspiration; given his frail state and immediate need for orthopedic surgery I think it is best if we hold  off on an attempt a thoracentesis for now P:   Proceed with surgery Continue Spiriva and Symbicort Out of bed as soon as possible after surgery Continue oxygen as needed to maintain O2 saturation greater than 92% SLP evaluation ordered Needs follow up after surgery as outpatient> ideally 6 week follow up to repeat CXR and evaluate effusion Will need repeat CT chest in 3 months to follow up the right upper lobe nodule  CARDIOVASCULAR A: Known coronary artery disease, peripheral vascular disease, and carotid vascular disease P:  Care per hospitalist service    Heber CarolinaBrent Lavaughn Haberle, MD [facility] Pager: [phone] Cell: [phone] After 3pm or if no response, call [phone]   05/30/2015, 9:56 AM

## 2015-05-30 NOTE — Anesthesia Postprocedure Evaluation (Signed)
  Anesthesia Post-op Note  Patient: Alec Walker  Procedure(s) Performed: Procedure(s) (LRB): INTRAMEDULLARY (IM) NAIL INTERTROCHANTRIC (Right)  Patient Location: PACU  Anesthesia Type: General  Level of Consciousness: awake and alert   Airway and Oxygen Therapy: Patient Spontanous Breathing  Post-op Pain: mild  Post-op Assessment: Post-op Vital signs reviewed, Patient's Cardiovascular Status Stable, Respiratory Function Stable, Patent Airway and No signs of Nausea or vomiting  Last Vitals:  Filed Vitals:   05/30/15 1204  BP: 138/47  Pulse: 75  Temp: 36.6 C  Resp: 20    Post-op Vital Signs: stable   Complications: No apparent anesthesia complications

## 2015-05-30 NOTE — Transfer of Care (Signed)
Immediate Anesthesia Transfer of Care Note  Patient: Alec Walker  Procedure(s) Performed: Procedure(s): INTRAMEDULLARY (IM) NAIL INTERTROCHANTRIC (Right)  Patient Location: PACU  Anesthesia Type:General  Level of Consciousness: awake, oriented, sedated, patient cooperative and responds to stimulation  Airway & Oxygen Therapy: Patient Spontanous Breathing and Patient connected to nasal cannula oxygen  Post-op Assessment: Report given to RN, Post -op Vital signs reviewed and stable, Patient moving all extremities and Patient moving all extremities X 4  Post vital signs: Reviewed and stable  Last Vitals:  Filed Vitals:   05/30/15 1204  BP: 138/47  Pulse: 75  Temp: 36.6 C  Resp: 20    Complications: No apparent anesthesia complications

## 2015-05-30 NOTE — Op Note (Signed)
DATE OF SURGERY:  05/30/2015  TIME: 1:23 PM  PATIENT NAME:  Alec Walker  AGE: 81 y.o.  PRE-OPERATIVE DIAGNOSIS:  Right Intertroch Fracture  POST-OPERATIVE DIAGNOSIS:  SAME  PROCEDURE:  INTRAMEDULLARY (IM) NAIL INTERTROCHANTRIC  SURGEON:  Bonny Vanleeuwen D  ASSISTANT:  Janalee DaneBrittney Kelly, PA-C, She was present and scrubbed throughout the case, critical for completion in a timely fashion, and for retraction, instrumentation, and closure.   OPERATIVE IMPLANTS: [employer]ryker Gamma Nail with distal interlock screw  PREOPERATIVE INDICATIONS:  Alec Walker is a 81 y.o. year old who fell and suffered a hip fracture. He was brought into the ER and then admitted and optimized and then elected for surgical intervention.    The risks benefits and alternatives were discussed with the patient including but not limited to the risks of nonoperative treatment, versus surgical intervention including infection, bleeding, nerve injury, malunion, nonunion, hardware prominence, hardware failure, need for hardware removal, blood clots, cardiopulmonary complications, morbidity, mortality, among others, and they were willing to proceed.    OPERATIVE PROCEDURE:  The patient was brought to the operating room and placed in the supine position. General anesthesia was administered, with a foley. He was placed on the fracture table.  Closed reduction was performed under C-arm guidance. The length of the femur was also measured using fluoroscopy. Time out was then performed after sterile prep and drape. He received preoperative antibiotics.  Incision was made proximal to the greater trochanter. A guidewire was placed in the appropriate position. Confirmation was made on AP and lateral views. The above-named nail was opened. I opened the proximal femur with a reamer. I then placed the nail by hand easily down. I did not need to ream the femur.  Once the nail was completely seated, I placed a guidepin into the femoral  head into the center center position. I measured the length, and then reamed the lateral cortex and up into the head. I then placed the lag screw. Slight compression was applied. Anatomic fixation achieved. Bone quality was mediocre.  I then secured the proximal interlocking bolt, and took off a half a turn, and then removed the instruments, and took final C-arm pictures AP and lateral the entire length of the leg.  I then used perfect circles technique to place a distal interlock screw.   Anatomic reconstruction was achieved, and the wounds were irrigated copiously and closed with Vicryl followed by staples and sterile gauze for the skin. The patient was awakened and returned to PACU in stable and satisfactory condition. There no complications and the patient tolerated the procedure well.  He will be weightbearing as tolerated, and will be on chemical px  for a period of four weeks after discharge.   Margarita Ranaimothy Treyshawn [name], M.D.    This note was generated using a template and dragon dictation system. In light of that, I have reviewed the note and all aspects of it are applicable to this case. Any dictation errors are due to the computerized dictation system.

## 2015-05-30 NOTE — H&P (View-Only) (Signed)
ORTHOPAEDIC CONSULTATION  REQUESTING PHYSICIAN: Delfina Redwood, MD  Chief Complaint: R hip pain  HPI: Alec Walker is a 81 y.o. male who complains of R hip pain after a mechanical fall at home approximately one week ago.  The patient had persistent pain and trouble with ambulation after the fall, so he presented to the ED.  Found to have a R intertrochanteric hip fracture on xray.  Patient reports that at the time of the fall he felt light headed and dizzy.  He then fell to the floor.  He denies hitting his head or LOC. Patient has end stage COPD and uses 3L of O2 at home.  He also has a hx of CAD with recent nuclear study in 2015 showing low risk.    Past Medical History  Diagnosis Date  . COPD (chronic obstructive pulmonary disease) ([place])   . CAD (coronary artery disease)     a.  nonobstructive CAD by cath 1999. b. Myoview 04/2012: fixed inferior and inferoseptal bowel attenuation artifact, no reversible ischemia. EF 62%. Low risk scan.  Marland Kitchen HTN (hypertension)   . PUD (peptic ulcer disease)     a. s/p surgery 1976.  Marland Kitchen Syncope     2012  . Carotid artery disease ([place]) 03/22/13    a. 50-69% BICA by duplex 03/2013, repeat needed 11/2013.  Marland Kitchen PAD (peripheral artery disease) ([place])     a. prior stenting of RCIA 1999. b. history of 90% vertebral artery narrowing, 40% subclavian artery narrowing. c. Diagnostic PV angio 08/2013 for progressive claudication - will ultimately need endarterectomy/patch angio on bilat CFA stenosis; will also need atherectomy/PT/stenting to REIA.   . CKD (chronic kidney disease), stage III   . HLD (hyperlipidemia)   . Chronic respiratory failure ([place])     a. On home O2.  Marland Kitchen NSVT (nonsustained ventricular tachycardia) ([place])     a. Brief NSVT (6b) during 08/2013 adm.  . Stroke ([place])   . Shortness of breath   . On supplemental oxygen therapy     2l   Past Surgical History  Procedure Laterality Date  . Cataract extraction    . Gastrectomy    .  Transurethral resection of prostate    . Coronary angioplasty with stent placement      stent in 2003; history of right common iliac artery stent hx  . Endarterectomy femoral Bilateral 11/11/2013    Procedure: BILATERAL FEMORAL ENDARTERECTOMIES WITH BILATERAL PATCH ANGIOPLASTIES;  Surgeon: Serafina Mitchell, MD;  Location: [facility];  Service: Vascular;  Laterality: Bilateral;  . Lower extremity angiogram N/A 09/01/2013    Procedure: LOWER EXTREMITY ANGIOGRAM;  Surgeon: Lorretta Harp, MD;  Location: [facility] CATH LAB;  Service: Cardiovascular;  Laterality: N/A;  . Esophagogastroduodenoscopy N/A 05/23/2015    Procedure: ESOPHAGOGASTRODUODENOSCOPY (EGD);  Surgeon: Laurence Spates, MD;  Location: [facility] ENDOSCOPY;  Service: Endoscopy;  Laterality: N/A;   Social History   Social History  . Marital Status: Married    Spouse Name: N/A  . Number of Children: 3  . Years of Education: N/A   Occupational History  . retired     Social History Main Topics  . Smoking status: Former Smoker -- 0.00 packs/day for 60 years    Types: Cigarettes    Quit date: 01/06/1997  . Smokeless tobacco: Never Used  . Alcohol Use: No  . Drug Use: No  . Sexual Activity: No   Other Topics Concern  . None   Social History Narrative  Lives with his wife who is disabled.  He does not use any assist device.  He mows grass, cooks, etc and cares for her.     Family History  Problem Relation Age of Onset  . Hypertension    . Coronary artery disease    . Stroke Mother   . Heart disease Mother   . Hypertension Mother   . Heart attack Mother   . Heart attack Brother   . Heart disease Brother   . Heart disease Father   . Hypertension Father   . Heart attack Father    Allergies  Allergen Reactions  . Ferrous Sulfate Nausea And Vomiting   Prior to Admission medications   Medication Sig Start Date End Date Taking? Authorizing Provider  acetaminophen (TYLENOL) 325 MG tablet Take 2 tablets (650 mg total) by mouth every 6 (six)  hours as needed for mild pain (or Fever >/= 101). 05/25/15  Yes Bonnielee Haff, MD  albuterol (PROVENTIL HFA;VENTOLIN HFA) 108 (90 BASE) MCG/ACT inhaler Inhale 2 puffs into the lungs every 4 (four) hours as needed for wheezing or shortness of breath. 06/13/14  Yes Janece Canterbury, MD  albuterol (PROVENTIL) (2.5 MG/3ML) 0.083% nebulizer solution INHALE 1 VIAL VIA NEBULIZER EVERY 6 HOURS AS NEEDED FOR WHEEZING 04/23/15  Yes Historical Provider, MD  aspirin EC 81 MG tablet Take 81 mg by mouth at bedtime.   Yes Historical Provider, MD  atorvastatin (LIPITOR) 40 MG tablet Take 1 tablet (40 mg total) by mouth daily at 6 PM. 05/25/15  Yes Bonnielee Haff, MD  budesonide-formoterol [facility]) 160-4.5 MCG/ACT inhaler Inhale 2 puffs into the lungs 2 (two) times daily.    Yes Historical Provider, MD  Cholecalciferol (VITAMIN D3) 2000 UNITS TABS Take 2,000 mg by mouth at bedtime.    Yes Historical Provider, MD  feeding supplement, ENSURE ENLIVE, (ENSURE ENLIVE) LIQD Take 237 mLs by mouth daily. 01/29/15  Yes Tasrif Ahmed, MD  guaiFENesin (MUCINEX) 600 MG 12 hr tablet Take 1 tablet (600 mg total) by mouth 2 (two) times daily as needed for cough. 05/25/15  Yes Bonnielee Haff, MD  metoprolol tartrate (LOPRESSOR) 25 MG tablet Take 25 mg by mouth 2 (two) times daily.    Yes Historical Provider, MD  mirtazapine (REMERON) 30 MG tablet Take 30 mg by mouth at bedtime.   Yes Historical Provider, MD  nitroGLYCERIN (NITROSTAT) 0.4 MG SL tablet Place 1 tablet (0.4 mg total) under the tongue every 5 (five) minutes as needed for chest pain. 06/13/14  Yes Janece Canterbury, MD  ondansetron (ZOFRAN) 4 MG tablet Take 1 tablet (4 mg total) by mouth every 6 (six) hours. 05/03/15  Yes Nat Christen, MD  OXYGEN-HELIUM IN Inhale 3 L into the lungs daily. 2 liters   Yes Historical Provider, MD  pantoprazole (PROTONIX) 40 MG tablet Take 40 mg by mouth 2 (two) times daily.   Yes Historical Provider, MD  senna-docusate (SENOKOT-S) 8.6-50 MG per tablet  Take 1 tablet by mouth at bedtime as needed for mild constipation. 01/29/15  Yes Tasrif Ahmed, MD  simethicone (MYLICON) 80 MG chewable tablet Chew 1 tablet (80 mg total) by mouth every 6 (six) hours as needed for flatulence. 01/29/15  Yes Tasrif Ahmed, MD  sucralfate (CARAFATE) 1 GM/10ML suspension Take 10 mLs (1 g total) by mouth 4 (four) times daily -  with meals and at bedtime. 05/25/15  Yes Bonnielee Haff, MD  temazepam (RESTORIL) 7.5 MG capsule Take 1 capsule (7.5 mg total) by mouth at bedtime as  needed for sleep. for sleep 05/25/15  Yes Bonnielee Haff, MD  tiotropium (SPIRIVA HANDIHALER) 18 MCG inhalation capsule Place 1 capsule (18 mcg total) into inhaler and inhale daily. 06/13/14  Yes Janece Canterbury, MD  vitamin B-12 1000 MCG tablet Take 1 tablet (1,000 mcg total) by mouth daily. 01/29/15  Yes Dellia Nims, MD   Dg Chest 1 View  05/29/2015   CLINICAL DATA:  Right hip pain after a fall.  EXAM: CHEST 1 VIEW  COMPARISON:  05/21/2015  FINDINGS: Normal heart size and pulmonary vascularity. Chronic emphysematous changes with scattered fibrosis in the lungs, likely due to chronic bronchitis. Lower lung opacities likely due to atelectasis. No blunting of the right costophrenic angle. Left costophrenic angle is not included within the field of view. No pneumothorax. Degenerative changes in the spine and shoulders. Surgical clips around the GE junction. Calcified and tortuous aorta.  IMPRESSION: Emphysematous and chronic bronchitic changes in the lungs with scattered fibrosis. Atelectasis in the lung bases.   Electronically Signed   By: Lucienne Capers M.D.   On: 05/29/2015 02:43   Ct Head Wo Contrast  05/29/2015   CLINICAL DATA:  Patient lost his balance and fell at home this evening. No loss of consciousness.  EXAM: CT HEAD WITHOUT CONTRAST  CT CERVICAL SPINE WITHOUT CONTRAST  TECHNIQUE: Multidetector CT imaging of the head and cervical spine was performed following the standard protocol without  intravenous contrast. Multiplanar CT image reconstructions of the cervical spine were also generated.  COMPARISON:  CT head 09/04/2012  FINDINGS: CT HEAD FINDINGS  Diffuse cerebral atrophy. Patchy low-attenuation changes throughout the deep white matter consistent with small vessel ischemia. Mild ventricular dilatation consistent with central atrophy. No mass effect or midline shift. No abnormal extra-axial fluid collections. Gray-white matter junctions are distinct. Basal cisterns are not effaced. No evidence of acute intracranial hemorrhage. No depressed skull fractures. Visualized paranasal sinuses and mastoid air cells are not opacified. Vascular calcifications.  CT CERVICAL SPINE FINDINGS  Diffuse bone demineralization. Normal alignment of the cervical spine. Diffuse degenerative changes with narrowed cervical interspaces and endplate hypertrophic changes. Degenerative changes throughout the facet joints. Prominent disc osteophyte complexes at C3-4, C4-5, and C5-6 levels. Bony encroachment upon the neural foramina at multiple levels bilaterally. No vertebral compression deformities. No prevertebral soft tissue swelling. C1-2 articulation appears intact. No focal bone lesion or bone destruction. Vascular calcifications. Scarring in the lung apices with suggestion of a cavitary lesion in the right apex. This is incompletely evaluated on the current study but neoplasm is not excluded. Suggest follow-up with elective CT chest for further evaluation. Small left pleural effusion versus pleural thickening.  IMPRESSION: No acute intracranial abnormalities. Chronic atrophy and small vessel ischemic changes.  Diffuse bone demineralization. Normal alignment of the cervical spine. Diffuse degenerative changes. No acute displaced fractures identified.  Incidental note of a possible lesion in the right lung apex with irregular margins and cavitation. CT chest is suggested to exclude pulmonary neoplasm. Small left pleural  effusion versus pleural thickening.   Electronically Signed   By: Lucienne Capers M.D.   On: 05/29/2015 04:56   Ct Chest Wo Contrast  05/29/2015   CLINICAL DATA:  History of COPD. Recent hip fracture with "Pulmonary nodule" .  EXAM: CT CHEST WITHOUT CONTRAST  TECHNIQUE: Multidetector CT imaging of the chest was performed following the standard protocol without IV contrast.  COMPARISON:  Chest radiograph of 05/29/2015. Chest CT of 09/22/2012.  FINDINGS: Mediastinum/Nodes: Bilateral advanced carotid atherosclerosis. advanced aortic and branch  vessel atherosclerosis. Mild cardiomegaly with coronary artery atherosclerosis. AP window nodes measure up to 11 mm versus 10 mm on the prior. Hilar regions poorly evaluated without intravenous contrast. Surgical changes at the gastroesophageal junction. Fluid level in the upper esophagus, on image/series 13/201  The lung apices are partially excluded.  Lungs/Pleura: Small bilateral pleural effusions. The left-sided pleural effusion has mild loculation, including superiorly and laterally on image/series 25/201. Left lower lobe endobronchial narrowing is likely due to compression or aspiration, including on image/series 34/203. Moderate centrilobular emphysema. Bibasilar dependent airspace disease.  Patchy opacities within the right upper and both lower lobes are favored to represent areas of scarring. right upper lobe opacities have a somewhat nodular component, including image 15/203. Also, nodularity at up to 11 mm on image/series 7/203.  Within the left upper lobe, peribronchovascular reticular nodular opacities.  Mild motion degradation.  Upper abdomen: Normal imaged portions of the liver, spleen, stomach, adrenal glands.  Musculoskeletal: Osteopenia. A mild to moderate T4 compression deformity is similar. Mild compression deformity at L1 is similar.  IMPRESSION: 1. Left upper lobe pulmonary opacities are suspicious for atypical infection or aspiration. 2. Left larger  than right pleural effusions with loculation involving the left-sided effusion. 3. Centrilobular emphysema. Bibasilar airspace opacities which are likely atelectasis. Probable scarring involving the right upper lobe. Recommend follow-up CT at 3 months to confirm stability of this appearance. 4. Esophageal air fluid level suggests dysmotility or gastroesophageal reflux. 5. Chronic thoracolumbar compression deformities. 6. Mild motion degradation.   Electronically Signed   By: Abigail Miyamoto M.D.   On: 05/29/2015 12:06   Ct Cervical Spine Wo Contrast  05/29/2015   CLINICAL DATA:  Patient lost his balance and fell at home this evening. No loss of consciousness.  EXAM: CT HEAD WITHOUT CONTRAST  CT CERVICAL SPINE WITHOUT CONTRAST  TECHNIQUE: Multidetector CT imaging of the head and cervical spine was performed following the standard protocol without intravenous contrast. Multiplanar CT image reconstructions of the cervical spine were also generated.  COMPARISON:  CT head 09/04/2012  FINDINGS: CT HEAD FINDINGS  Diffuse cerebral atrophy. Patchy low-attenuation changes throughout the deep white matter consistent with small vessel ischemia. Mild ventricular dilatation consistent with central atrophy. No mass effect or midline shift. No abnormal extra-axial fluid collections. Gray-white matter junctions are distinct. Basal cisterns are not effaced. No evidence of acute intracranial hemorrhage. No depressed skull fractures. Visualized paranasal sinuses and mastoid air cells are not opacified. Vascular calcifications.  CT CERVICAL SPINE FINDINGS  Diffuse bone demineralization. Normal alignment of the cervical spine. Diffuse degenerative changes with narrowed cervical interspaces and endplate hypertrophic changes. Degenerative changes throughout the facet joints. Prominent disc osteophyte complexes at C3-4, C4-5, and C5-6 levels. Bony encroachment upon the neural foramina at multiple levels bilaterally. No vertebral  compression deformities. No prevertebral soft tissue swelling. C1-2 articulation appears intact. No focal bone lesion or bone destruction. Vascular calcifications. Scarring in the lung apices with suggestion of a cavitary lesion in the right apex. This is incompletely evaluated on the current study but neoplasm is not excluded. Suggest follow-up with elective CT chest for further evaluation. Small left pleural effusion versus pleural thickening.  IMPRESSION: No acute intracranial abnormalities. Chronic atrophy and small vessel ischemic changes.  Diffuse bone demineralization. Normal alignment of the cervical spine. Diffuse degenerative changes. No acute displaced fractures identified.  Incidental note of a possible lesion in the right lung apex with irregular margins and cavitation. CT chest is suggested to exclude pulmonary neoplasm. Small left pleural  effusion versus pleural thickening.   Electronically Signed   By: Lucienne Capers M.D.   On: 05/29/2015 04:56   Dg Hip Unilat With Pelvis 2-3 Views Right  05/29/2015   CLINICAL DATA:  Right hip pain after a fall.  EXAM: DG HIP (WITH OR WITHOUT PELVIS) 2-3V RIGHT  COMPARISON:  None.  FINDINGS: Comminuted inter trochanteric fracture of the right hip with varus angulation of the fracture fragments and displaced lesser trochanteric fragment. No evidence of dislocation of the right hip. Degenerative changes in the lower lumbar spine and both hips. Diffuse bone demineralization. Pelvis appears intact. SI joints and symphysis pubis are not displaced. Vascular calcifications.  IMPRESSION: Acute comminuted inter trochanteric fracture of the right hip with varus angulation and displacement of lesser trochanteric fragment.   Electronically Signed   By: Lucienne Capers M.D.   On: 05/29/2015 03:27    Positive ROS: All other systems have been reviewed and were otherwise negative with the exception of those mentioned in the HPI and as above.  Labs cbc  Recent Labs   05/29/15 0205  WBC 9.3  HGB 9.0*  HCT 30.1*  PLT 331    Labs inflam No results for input(s): CRP in the last 72 hours.  Invalid input(s): ESR  Labs coag  Recent Labs  05/29/15 0205  INR 1.01     Recent Labs  05/29/15 0205  NA 138  K 3.3*  CL 104  CO2 27  GLUCOSE 142*  BUN 12  CREATININE 0.96  CALCIUM 8.1*    Physical Exam: Filed Vitals:   05/29/15 1300  BP: 114/62  Pulse: 76  Temp: 98.1 F (36.7 C)  Resp: 22   General: Alert, no acute distress Cardiovascular: No pedal edema Respiratory: No cyanosis, no use of accessory musculature GI: No organomegaly, abdomen is soft and non-tender Skin: No lesions in the area of chief complaint other than those listed below in MSK exam.  Neurologic: Sensation intact distally Psychiatric: Patient is competent for consent with normal mood and affect Lymphatic: No axillary or cervical lymphadenopathy  MUSCULOSKELETAL:  R hip is shortened and externally rotated while resting in bed.  Tender to palpation.  Pain with log roll.  Limited ROM due to pain.  Sensation intact with 2+ distal pulses.  Other extremities are atraumatic with painless ROM and NVI.  Assessment: R intertrochanteric hip fracture s/p fall at home one week ago  Plan: Xray findings were reviewed with the patient.  Recommending surgical correction to help with mobilization.  Risks of surgery discussed with the patient.  Patient expresses understanding and wishes to proceed with surgery.  Will plan to take to the OR tomorrow for IM nail of the R hip.  Will be bedrest until then.  NPO after midnight.   Gae Dry, PA-C Cell [phone]   05/29/2015 2:50 PM

## 2015-05-30 NOTE — Discharge Instructions (Signed)

## 2015-05-30 NOTE — Progress Notes (Addendum)
[facility] PROGRESS NOTE  Alec CUSTIS WUJ:[id number] DOB: 1927/01/02 DOA: 05/29/2015 PCP: Eartha Inch, MD  Assessment/Plan:  Principal Problem:   Fracture, intertrochanteric, right femur [facility]): for surgery today. Will get 2 units prbc preop Active Problems:   Acute blood loss anemia in the setting of chronic anemia: Patient's hemoglobin was only 9. Today, hemoglobin is 6.8 (after hydration).  Will give 2 units of packed red blood cells. In June, iron and ferritin and B-12 were low. Will recheck B-12 level. Takes oral B12 daily.   COPD (chronic obstructive pulmonary disease) (HCC), severe. Increased risk for pulmonary complications noted. Pulmonary medicine following.   CAD (coronary artery disease) stable. And medically optimized   HTN (hypertension)   PVD (peripheral vascular disease) (HCC)   CKD (chronic kidney disease), stage III   Severe protein-calorie malnutrition (HCC): History of Billroth II for peptic ulcer disease. Had recent admission for ulcerative esophagitis related to chronic NSAID use. Also had ulceration at gastrojejunostomy site.   Chronic respiratory failure with hypoxia (HCC)   Pulmonary nodule: Pulmonary medicine ordered CT chest. Multiple abnormalities including questionable scarring versus atypical infection versus aspiration. Speech therapy has been consulted. Patient denies change in chronic cough. Denies acute shortness of breath. doubt pulmonary infection at this time. Also small bilateral pleural effusions, partially loculated on the left. Discussed with Dr. Kendrick Fries. He will follow-up today.   Urinary retention: Foley catheter placed last night for bladder scan of 1500 mL    Code Status:  DO NOT RESUSCITATE  Family Communication:   Disposition Plan:  Likely skilled nursing facility   Consultants:   Orthopedics  Pulmonary medicine  Procedures:     Antibiotics:    HPI/Subjective:  No change in chronic cough. No dyspnea. Pain  control. No chest pain. No reported bleeding.  Objective: Filed Vitals:   05/30/15 0559  BP: 98/58  Pulse: 87  Temp: 99 F (37.2 C)  Resp: 20    Intake/Output Summary (Last 24 hours) at 05/30/15 0938 Last data filed at 05/30/15 0600  Gross per 24 hour  Intake      0 ml  Output    875 ml  Net   -875 ml   Filed Weights   05/29/15 0146  Weight: 45.36 kg (100 lb)    Exam:   General:  Asleep. Arousable. Heart appearing. Appears comfortable lying flat.  Cardiovascular:  Regular rate rhythm without murmurs gallops rubs  Respiratory:  Diminished throughout without wheezes rhonchi or rales  Abdomen:  Soft nontender nondistended  Ext:  No clubbing cyanosis or edema  Basic Metabolic Panel:  Recent Labs Lab 05/24/15 0435 05/29/15 0205 05/30/15 0655  NA 141 138 142  K 3.5 3.3* 3.6  CL 112* 104 108  CO2 21* 27 25  GLUCOSE 94 142* 85  BUN 8 12 8   CREATININE 0.86 0.96 0.94  CALCIUM 7.9* 8.1* 7.7*   Liver Function Tests:  Recent Labs Lab 05/29/15 0205  AST 14*  ALT 11*  ALKPHOS 92  BILITOT 0.2*  PROT 4.6*  ALBUMIN 1.8*   No results for input(s): LIPASE, AMYLASE in the last 168 hours. No results for input(s): AMMONIA in the last 168 hours. CBC:  Recent Labs Lab 05/24/15 0435 05/29/15 0205 05/30/15 0655  WBC 10.1 9.3 9.8  NEUTROABS  --  7.2  --   HGB 9.0* 9.0* 6.8*  HCT 29.1* 30.1* 22.0*  MCV 90.1 89.9 89.4  PLT 298 331 234   Cardiac Enzymes: No results for input(s): CKTOTAL, CKMB,  CKMBINDEX, TROPONINI in the last 168 hours. BNP (last 3 results)  Recent Labs  01/25/15 1707 05/22/15 0412  BNP 214.1* 286.4*    ProBNP (last 3 results)  Recent Labs  06/10/14 1156  PROBNP 398.9    CBG:  Recent Labs Lab 05/24/15 0705 05/25/15 0553 05/25/15 0637  GLUCAP 88 73 85    Recent Results (from the past 240 hour(s))  MRSA PCR Screening     Status: Abnormal   Collection Time: 05/21/15 10:16 PM  Result Value Ref Range Status   MRSA by PCR  POSITIVE (A) NEGATIVE Final    Comment:        The GeneXpert MRSA Assay (FDA approved for NASAL specimens only), is one component of a comprehensive MRSA colonization surveillance program. It is not intended to diagnose MRSA infection nor to guide or monitor treatment for MRSA infections. RESULT CALLED TO, READ BACK BY AND VERIFIED WITH: [email or web address]  05/21/15 MKELLY      Studies: Dg Chest 1 View  05/29/2015  CLINICAL DATA:  Right hip pain after a fall. EXAM: CHEST 1 VIEW COMPARISON:  05/21/2015 FINDINGS: Normal heart size and pulmonary vascularity. Chronic emphysematous changes with scattered fibrosis in the lungs, likely due to chronic bronchitis. Lower lung opacities likely due to atelectasis. No blunting of the right costophrenic angle. Left costophrenic angle is not included within the field of view. No pneumothorax. Degenerative changes in the spine and shoulders. Surgical clips around the GE junction. Calcified and tortuous aorta. IMPRESSION: Emphysematous and chronic bronchitic changes in the lungs with scattered fibrosis. Atelectasis in the lung bases. Electronically Signed   By: Burman Nieves M.D.   On: 05/29/2015 02:43   Ct Head Wo Contrast  05/29/2015  CLINICAL DATA:  Patient lost his balance and fell at home this evening. No loss of consciousness. EXAM: CT HEAD WITHOUT CONTRAST CT CERVICAL SPINE WITHOUT CONTRAST TECHNIQUE: Multidetector CT imaging of the head and cervical spine was performed following the standard protocol without intravenous contrast. Multiplanar CT image reconstructions of the cervical spine were also generated. COMPARISON:  CT head 09/04/2012 FINDINGS: CT HEAD FINDINGS Diffuse cerebral atrophy. Patchy low-attenuation changes throughout the deep white matter consistent with small vessel ischemia. Mild ventricular dilatation consistent with central atrophy. No mass effect or midline shift. No abnormal extra-axial fluid collections. Gray-white matter junctions are  distinct. Basal cisterns are not effaced. No evidence of acute intracranial hemorrhage. No depressed skull fractures. Visualized paranasal sinuses and mastoid air cells are not opacified. Vascular calcifications. CT CERVICAL SPINE FINDINGS Diffuse bone demineralization. Normal alignment of the cervical spine. Diffuse degenerative changes with narrowed cervical interspaces and endplate hypertrophic changes. Degenerative changes throughout the facet joints. Prominent disc osteophyte complexes at C3-4, C4-5, and C5-6 levels. Bony encroachment upon the neural foramina at multiple levels bilaterally. No vertebral compression deformities. No prevertebral soft tissue swelling. C1-2 articulation appears intact. No focal bone lesion or bone destruction. Vascular calcifications. Scarring in the lung apices with suggestion of a cavitary lesion in the right apex. This is incompletely evaluated on the current study but neoplasm is not excluded. Suggest follow-up with elective CT chest for further evaluation. Small left pleural effusion versus pleural thickening. IMPRESSION: No acute intracranial abnormalities. Chronic atrophy and small vessel ischemic changes. Diffuse bone demineralization. Normal alignment of the cervical spine. Diffuse degenerative changes. No acute displaced fractures identified. Incidental note of a possible lesion in the right lung apex with irregular margins and cavitation. CT chest is suggested to exclude pulmonary neoplasm. Small left pleural  effusion versus pleural thickening. Electronically Signed   By: Burman NievesWilliam  Stevens M.D.   On: 05/29/2015 04:56   Ct Chest Wo Contrast  05/29/2015  CLINICAL DATA:  History of COPD. Recent hip fracture with "Pulmonary nodule" . EXAM: CT CHEST WITHOUT CONTRAST TECHNIQUE: Multidetector CT imaging of the chest was performed following the standard protocol without IV contrast. COMPARISON:  Chest radiograph of 05/29/2015. Chest CT of 09/22/2012. FINDINGS:  Mediastinum/Nodes: Bilateral advanced carotid atherosclerosis. advanced aortic and branch vessel atherosclerosis. Mild cardiomegaly with coronary artery atherosclerosis. AP window nodes measure up to 11 mm versus 10 mm on the prior. Hilar regions poorly evaluated without intravenous contrast. Surgical changes at the gastroesophageal junction. Fluid level in the upper esophagus, on image/series 13/201 The lung apices are partially excluded. Lungs/Pleura: Small bilateral pleural effusions. The left-sided pleural effusion has mild loculation, including superiorly and laterally on image/series 25/201. Left lower lobe endobronchial narrowing is likely due to compression or aspiration, including on image/series 34/203. Moderate centrilobular emphysema. Bibasilar dependent airspace disease. Patchy opacities within the right upper and both lower lobes are favored to represent areas of scarring. right upper lobe opacities have a somewhat nodular component, including image 15/203. Also, nodularity at up to 11 mm on image/series 7/203. Within the left upper lobe, peribronchovascular reticular nodular opacities. Mild motion degradation. Upper abdomen: Normal imaged portions of the liver, spleen, stomach, adrenal glands. Musculoskeletal: Osteopenia. A mild to moderate T4 compression deformity is similar. Mild compression deformity at L1 is similar. IMPRESSION: 1. Left upper lobe pulmonary opacities are suspicious for atypical infection or aspiration. 2. Left larger than right pleural effusions with loculation involving the left-sided effusion. 3. Centrilobular emphysema. Bibasilar airspace opacities which are likely atelectasis. Probable scarring involving the right upper lobe. Recommend follow-up CT at 3 months to confirm stability of this appearance. 4. Esophageal air fluid level suggests dysmotility or gastroesophageal reflux. 5. Chronic thoracolumbar compression deformities. 6. Mild motion degradation. Electronically Signed    By: Jeronimo GreavesKyle  Talbot M.D.   On: 05/29/2015 12:06   Ct Cervical Spine Wo Contrast  05/29/2015  CLINICAL DATA:  Patient lost his balance and fell at home this evening. No loss of consciousness. EXAM: CT HEAD WITHOUT CONTRAST CT CERVICAL SPINE WITHOUT CONTRAST TECHNIQUE: Multidetector CT imaging of the head and cervical spine was performed following the standard protocol without intravenous contrast. Multiplanar CT image reconstructions of the cervical spine were also generated. COMPARISON:  CT head 09/04/2012 FINDINGS: CT HEAD FINDINGS Diffuse cerebral atrophy. Patchy low-attenuation changes throughout the deep white matter consistent with small vessel ischemia. Mild ventricular dilatation consistent with central atrophy. No mass effect or midline shift. No abnormal extra-axial fluid collections. Gray-white matter junctions are distinct. Basal cisterns are not effaced. No evidence of acute intracranial hemorrhage. No depressed skull fractures. Visualized paranasal sinuses and mastoid air cells are not opacified. Vascular calcifications. CT CERVICAL SPINE FINDINGS Diffuse bone demineralization. Normal alignment of the cervical spine. Diffuse degenerative changes with narrowed cervical interspaces and endplate hypertrophic changes. Degenerative changes throughout the facet joints. Prominent disc osteophyte complexes at C3-4, C4-5, and C5-6 levels. Bony encroachment upon the neural foramina at multiple levels bilaterally. No vertebral compression deformities. No prevertebral soft tissue swelling. C1-2 articulation appears intact. No focal bone lesion or bone destruction. Vascular calcifications. Scarring in the lung apices with suggestion of a cavitary lesion in the right apex. This is incompletely evaluated on the current study but neoplasm is not excluded. Suggest follow-up with elective CT chest for further evaluation. Small left pleural effusion  versus pleural thickening. IMPRESSION: No acute intracranial  abnormalities. Chronic atrophy and small vessel ischemic changes. Diffuse bone demineralization. Normal alignment of the cervical spine. Diffuse degenerative changes. No acute displaced fractures identified. Incidental note of a possible lesion in the right lung apex with irregular margins and cavitation. CT chest is suggested to exclude pulmonary neoplasm. Small left pleural effusion versus pleural thickening. Electronically Signed   By: Burman Nieves M.D.   On: 05/29/2015 04:56   Dg Hip Unilat With Pelvis 2-3 Views Right  05/29/2015  CLINICAL DATA:  Right hip pain after a fall. EXAM: DG HIP (WITH OR WITHOUT PELVIS) 2-3V RIGHT COMPARISON:  None. FINDINGS: Comminuted inter trochanteric fracture of the right hip with varus angulation of the fracture fragments and displaced lesser trochanteric fragment. No evidence of dislocation of the right hip. Degenerative changes in the lower lumbar spine and both hips. Diffuse bone demineralization. Pelvis appears intact. SI joints and symphysis pubis are not displaced. Vascular calcifications. IMPRESSION: Acute comminuted inter trochanteric fracture of the right hip with varus angulation and displacement of lesser trochanteric fragment. Electronically Signed   By: Burman Nieves M.D.   On: 05/29/2015 03:27    Scheduled Meds: . sodium chloride   Intravenous Once  . antiseptic oral rinse  7 mL Mouth Rinse BID  . aspirin EC  81 mg Oral QHS  . atorvastatin  40 mg Oral q1800  . budesonide-formoterol  2 puff Inhalation BID  .  ceFAZolin (ANCEF) IV  2 g Intravenous To OR  . feeding supplement  1 Container Oral TID BM  . feeding supplement (PRO-STAT SUGAR FREE 64)  30 mL Oral Daily  . metoprolol tartrate  25 mg Oral BID  . mirtazapine  30 mg Oral QHS  . pantoprazole  40 mg Oral BID  . sucralfate  1 g Oral TID WC & HS  . tiotropium  18 mcg Inhalation Daily   Continuous Infusions: . 0.9 % NaCl with KCl 20 mEq / L 75 mL/hr at 05/29/15 1138    Time spent: 35  minutes  Stevenson Windmiller L  [facility]  [email or web address], password [email or web address] 05/30/2015, 9:38 AM  LOS: 1 day

## 2015-05-31 ENCOUNTER — Encounter (HOSPITAL_COMMUNITY): Payer: Self-pay | Admitting: Orthopedic Surgery

## 2015-05-31 LAB — CBC
HEMATOCRIT: 30 % — AB (ref 39.0–52.0)
Hemoglobin: 9.6 g/dL — ABNORMAL LOW (ref 13.0–17.0)
MCH: 28 pg (ref 26.0–34.0)
MCHC: 32 g/dL (ref 30.0–36.0)
MCV: 87.5 fL (ref 78.0–100.0)
Platelets: 239 10*3/uL (ref 150–400)
RBC: 3.43 MIL/uL — ABNORMAL LOW (ref 4.22–5.81)
RDW: 15.3 % (ref 11.5–15.5)
WBC: 9.8 10*3/uL (ref 4.0–10.5)

## 2015-05-31 LAB — BASIC METABOLIC PANEL
Anion gap: 5 (ref 5–15)
BUN: 9 mg/dL (ref 6–20)
CHLORIDE: 108 mmol/L (ref 101–111)
CO2: 25 mmol/L (ref 22–32)
Calcium: 7.5 mg/dL — ABNORMAL LOW (ref 8.9–10.3)
Creatinine, Ser: 0.95 mg/dL (ref 0.61–1.24)
GFR calc Af Amer: >60 mL/min (ref >60)
GFR calc non Af Amer: >60 mL/min (ref >60)
GLUCOSE: 99 mg/dL (ref 65–99)
POTASSIUM: 3.7 mmol/L (ref 3.5–5.1)
Sodium: 138 mmol/L (ref 135–145)

## 2015-05-31 LAB — TYPE AND SCREEN
ABO/RH(D): O POS
Antibody Screen: NEGATIVE
UNIT DIVISION: 0
UNIT DIVISION: 0

## 2015-05-31 MED ORDER — TAMSULOSIN HCL 0.4 MG PO CAPS
0.4000 mg | ORAL_CAPSULE | Freq: Every day | ORAL | Status: DC
  Administered 2015-06-01: 0.4 mg via ORAL
  Filled 2015-05-31: qty 1

## 2015-05-31 NOTE — Progress Notes (Signed)
[facility] PROGRESS NOTE  Alec HorsfallJohnie E Destin GNF:[id number] DOB: 03-11-1927 DOA: 05/29/2015 PCP: Eartha InchBADGER,MICHAEL C, MD  Summary 81 y.o. male with a past medical history significant for HTN, hyperlipidemia, chronic respiratory failure on 3 L at home secondary to COPD, CAD, CKD stage III, and PUD s/p remote Billroth II and admission 1 week ago for ulcerative esophagitis and ulcer who presents with fall and hip fracture.  Pulmonary consulted preoperatively. Patient had 2 units pRBC preop, IM nail 10/12  Assessment/Plan:  Principal Problem:   Fracture, intertrochanteric, right femur (HCC): s/p IM nail. Started on ASA for DVT prophylaxis by ortho. Will see how pt tolerates. Might need to change due to recent N/V, esophagitis, which was felt to be NSAID related  Active Problems:   Acute blood loss anemia in the setting of chronic anemia: Patient's hemoglobin was only 9.  hemoglobin is 6.8 10/12 (after hydration).  B12 ok. S/p 2 units pRBC 10/12. hgb >9 today.    COPD (chronic obstructive pulmonary disease) (HCC), severe. At baseline    CAD (coronary artery disease) stable.     HTN (hypertension)    PVD (peripheral vascular disease) (HCC)    CKD (chronic kidney disease), stage III    Severe protein-calorie malnutrition (HCC): History of Billroth II for peptic ulcer disease. Had recent admission for ulcerative esophagitis related to chronic NSAID use. Also had ulceration at gastrojejunostomy site.    Chronic respiratory failure with hypoxia (HCC)    Pulmonary nodule: Pulmonary medicine ordered CT chest. Multiple abnormalities including questionable scarring versus atypical infection versus aspiration. Likely chronic aspiration per pulmonary. Speech therapy notes probable esophageal dysphagia. Patient denies change in chronic cough. Needs pulmonary follow up after surgery as outpatient> ideally 6 week follow up to repeat CXR and evaluate effusion Will need repeat CT chest in 3 months to  follow up the right upper lobe nodule    Urinary retention: Foley catheter placed for bladder scan of 1500 mL. Started flomax. Voiding trial once stronger.   Code Status:  DO NOT RESUSCITATE  Family Communication:   Disposition Plan:  SNF when stable  Consultants:   Orthopedics  Pulmonary medicine  Procedures:   IM nail  Antibiotics:    HPI/Subjective: Pain controlled. No acute dyspnea  Objective: Filed Vitals:   05/31/15 0614  BP: 104/51  Pulse: 80  Temp: 98.7 F (37.1 C)  Resp: 20    Intake/Output Summary (Last 24 hours) at 05/31/15 1516 Last data filed at 05/31/15 1200  Gross per 24 hour  Intake      0 ml  Output    750 ml  Net   -750 ml   Filed Weights   05/29/15 0146  Weight: 45.36 kg (100 lb)    Exam:   General:  Asleep. Arousable. oriented  Cardiovascular:  Regular rate rhythm without murmurs gallops rubs  Respiratory:  Diminished throughout without wheezes rhonchi or rales  Abdomen:  Soft nontender nondistended  Ext:  No clubbing cyanosis or edema  Basic Metabolic Panel:  Recent Labs Lab 05/29/15 0205 05/30/15 0655 05/31/15 0521  NA 138 142 138  K 3.3* 3.6 3.7  CL 104 108 108  CO2 27 25 25   GLUCOSE 142* 85 99  BUN 12 8 9   CREATININE 0.96 0.94 0.95  CALCIUM 8.1* 7.7* 7.5*   Liver Function Tests:  Recent Labs Lab 05/29/15 0205  AST 14*  ALT 11*  ALKPHOS 92  BILITOT 0.2*  PROT 4.6*  ALBUMIN 1.8*   No results for input(s):  LIPASE, AMYLASE in the last 168 hours. No results for input(s): AMMONIA in the last 168 hours. CBC:  Recent Labs Lab 05/29/15 0205 05/30/15 0655 05/31/15 0521  WBC 9.3 9.8 9.8  NEUTROABS 7.2  --   --   HGB 9.0* 6.8* 9.6*  HCT 30.1* 22.0* 30.0*  MCV 89.9 89.4 87.5  PLT 331 234 239   Cardiac Enzymes: No results for input(s): CKTOTAL, CKMB, CKMBINDEX, TROPONINI in the last 168 hours. BNP (last 3 results)  Recent Labs  01/25/15 1707 05/22/15 0412  BNP 214.1* 286.4*    ProBNP (last 3  results)  Recent Labs  06/10/14 1156  PROBNP 398.9    CBG:  Recent Labs Lab 05/25/15 0553 05/25/15 0637  GLUCAP 73 85    Recent Results (from the past 240 hour(s))  MRSA PCR Screening     Status: Abnormal   Collection Time: 05/21/15 10:16 PM  Result Value Ref Range Status   MRSA by PCR POSITIVE (A) NEGATIVE Final    Comment:        The GeneXpert MRSA Assay (FDA approved for NASAL specimens only), is one component of a comprehensive MRSA colonization surveillance program. It is not intended to diagnose MRSA infection nor to guide or monitor treatment for MRSA infections. RESULT CALLED TO, READ BACK BY AND VERIFIED WITH: [email or web address]  05/21/15 MKELLY      Studies: Pelvis Portable  05/30/2015  CLINICAL DATA:  Right hip fracture status post internal fixation. EXAM: PORTABLE PELVIS AND RIGHT FEMUR- 2 VIEWS COMPARISON:  05/30/2015 FINDINGS: Intramedullary rod and screw fixation of intertrochanteric right hip fracture is seen in anatomic alignment. Distal fixation screw is seen across the intramedullary rod in the distal femur. No other fracture or complication identified. Peripheral vascular calcification noted as well as generalized osteopenia. IMPRESSION: Intramedullary rod and screw fixation of intertrochanteric right hip fracture in anatomic alignment. Electronically Signed   By: Myles Rosenthal M.D.   On: 05/30/2015 18:07   Dg C-arm 1-60 Min  05/30/2015  CLINICAL DATA:  Right femur surgery EXAM: RIGHT FEMUR 2 VIEWS; DG C-ARM 61-120 MIN COMPARISON:  None FINDINGS: Four intraoperative views of the right femur submitted. Intra medullary rod and fixation locking screw is noted along right femoral neck. There is anatomic alignment. Fluoroscopy time was 41 seconds.  Please see the operative report. IMPRESSION: Intra medullary rod and metallic nail in right proximal femur with anatomic alignment. Please see the operative report. Electronically Signed   By: Natasha Mead M.D.   On: 05/30/2015  14:36   Dg Femur, Min 2 Views Right  05/30/2015  CLINICAL DATA:  Right femur surgery EXAM: RIGHT FEMUR 2 VIEWS; DG C-ARM 61-120 MIN COMPARISON:  None FINDINGS: Four intraoperative views of the right femur submitted. Intra medullary rod and fixation locking screw is noted along right femoral neck. There is anatomic alignment. Fluoroscopy time was 41 seconds.  Please see the operative report. IMPRESSION: Intra medullary rod and metallic nail in right proximal femur with anatomic alignment. Please see the operative report. Electronically Signed   By: Natasha Mead M.D.   On: 05/30/2015 14:36    Scheduled Meds: . antiseptic oral rinse  7 mL Mouth Rinse BID  . aspirin EC  325 mg Oral Q breakfast  . atorvastatin  40 mg Oral q1800  . budesonide-formoterol  2 puff Inhalation BID  . feeding supplement  1 Container Oral TID BM  . feeding supplement (PRO-STAT SUGAR FREE 64)  30 mL Oral Daily  . metoprolol tartrate  25 mg Oral BID  . mirtazapine  30 mg Oral QHS  . pantoprazole  40 mg Oral BID  . sucralfate  1 g Oral TID WC & HS  . tiotropium  18 mcg Inhalation Daily  . vitamin B-12  1,000 mcg Oral Daily   Continuous Infusions: . 0.9 % NaCl with KCl 20 mEq / L 75 mL/hr at 05/30/15 1619    Time spent: 35 minutes  Lashay Osborne L  [facility]  [email or web address], password [facility] 05/31/2015, 3:16 PM  LOS: 2 days

## 2015-05-31 NOTE — Progress Notes (Signed)
PULMONARY / CRITICAL CARE MEDICINE   Name: Alec HorsfallJohnie E Walker MRN: [id number] DOB: [birth year]    ADMISSION DATE:  05/29/2015 CONSULTATION DATE:  05/29/2015  REFERRING MD :  Lendell CapriceSullivan  CHIEF COMPLAINT:  Hip fracture, pulmonary pre-operative clearance  INITIAL PRESENTATION: 81 year old male admitted on 05/29/2015 with a hip fracture. Pulmonary medicine was consulted on the same day for perioperative risk stratification.  STUDIES:  05/29/2015 head CT C-spine CT> no acute intracranial abnormality, diffuse bone demineralization, no displaced fractures noted, there is suggestion of a right apex nodule with irregular margins and cavitation, CT chest recommended 05/29/2015 CT chest > right upper lobe nodule 10.8 mm with nearby reticulation likely representing a scar, moderate emphysema, bilateral effusions, left > right, the left is loculated, there is bronchiectasis in the left lung with tree-in-bud abnormalities, small nodules, and mucus plugging, the esophagus is patulous  SIGNIFICANT EVENTS: 10/13 R hip IM nailing  SUBJECTIVE:  Had some dyspnea this moring, now better after breathing treatment  VITAL SIGNS: Temp:  [97.3 F (36.3 C)-98.7 F (37.1 C)] 98.7 F (37.1 C) (10/13 0614) Pulse Rate:  [59-80] 80 (10/13 0614) Resp:  [19-27] 20 (10/13 0614) BP: (95-143)/(47-78) 104/51 mmHg (10/13 0614) SpO2:  [89 %-100 %] 97 % (10/13 0815) HEMODYNAMICS:   VENTILATOR SETTINGS:   INTAKE / OUTPUT:  Intake/Output Summary (Last 24 hours) at 05/31/15 1039 Last data filed at 05/30/15 2030  Gross per 24 hour  Intake    950 ml  Output    300 ml  Net    650 ml    PHYSICAL EXAMINATION: General:  Frail, cachectic male in no distress in bed Neuro:  Awake alert, oriented 4, moves all 4 extremities well HEENT:  Normocephalic atraumatic, oropharynx clear Cardiovascular:  Regular rate and rhythm on my exam, no audible murmurs Lungs:  Clear to auscultation bilaterally without wheezing again  today Abdomen:  Bowel sounds positive, nontender nondistended Skin:  Multiple surgical scars, well-healed, no rash or skin breakdown  LABS:  CBC  Recent Labs Lab 05/29/15 0205 05/30/15 0655 05/31/15 0521  WBC 9.3 9.8 9.8  HGB 9.0* 6.8* 9.6*  HCT 30.1* 22.0* 30.0*  PLT 331 234 239   Coag's  Recent Labs Lab 05/29/15 0205  APTT 37  INR 1.01   BMET  Recent Labs Lab 05/29/15 0205 05/30/15 0655 05/31/15 0521  NA 138 142 138  K 3.3* 3.6 3.7  CL 104 108 108  CO2 27 25 25   BUN 12 8 9   CREATININE 0.96 0.94 0.95  GLUCOSE 142* 85 99   Electrolytes  Recent Labs Lab 05/29/15 0205 05/30/15 0655 05/31/15 0521  CALCIUM 8.1* 7.7* 7.5*   Sepsis Markers No results for input(s): LATICACIDVEN, PROCALCITON, O2SATVEN in the last 168 hours. ABG No results for input(s): PHART, PCO2ART, PO2ART in the last 168 hours. Liver Enzymes  Recent Labs Lab 05/29/15 0205  AST 14*  ALT 11*  ALKPHOS 92  BILITOT 0.2*  ALBUMIN 1.8*   Cardiac Enzymes No results for input(s): TROPONINI, PROBNP in the last 168 hours. Glucose  Recent Labs Lab 05/25/15 0553 05/25/15 0637  GLUCAP 73 85    Imaging  05/29/2015 CT of head and spine images personally reviewed showing a cavitary nodule in the right upper lobe with irregular borders  10/11 CT chest reviewed> see my description above   ASSESSMENT / PLAN:  PULMONARY  A: Severe COPD, FEV1 in 2002 was 1.94 L (62% predicted), on 3 L of oxygen at baseline and not in exacerbation.  He  has done well postoperatively, apparently he had some mild wheezing this morning but that has now resolved. He is not having an exacerbation of COPD at this time.  Pulmonary nodule, right upper lobe favored to represent scarring but needs follow up  CT imaging of chest suggestive of chronic aspiration (I favor given patulous esophagus) vs atypical infection with bronchiectasis  Pleural effusion> new problem, loculated on left likely due to recurrent and  chronic aspiration; given his frail state and immediate need for orthopedic surgery I think it is best if we hold off on an attempt a thoracentesis for now P:   Continue Spiriva and Symbicort Out of bed as soon as possible today  Continue oxygen as needed to maintain O2 saturation greater then 92%   speech therapy to see him today to evaluate for dysphagia  Needs follow up after surgery as outpatient> ideally 6 week follow up to repeat CXR and evaluate effusion Will need repeat CT chest in 3 months to follow up the right upper lobe nodule  CARDIOVASCULAR A: Known coronary artery disease, peripheral vascular disease, and carotid vascular disease P:  Care per hospitalist service  Pulmonary and critical care medicine will sign off, call with questions.   Heber [place], MD [facility] Pager: [phone] Cell: [phone] After 3pm or if no response, call [phone]   05/31/2015, 10:39 AM

## 2015-05-31 NOTE — Evaluation (Signed)
Occupational Therapy Evaluation Patient Details Name: Alec Walker MRN: [id number] DOB: 22-Jul-1927 Today's Date: 05/31/2015    History of Present Illness 81 y.o. male with PMH of hypertension, hyperlipidemia, GERD, CAD, S/P stent placement, COPD on 3 L oxygen at home, PUD, PAD, CKD-III, stroke, anemia, diastolic congestive heart failure, s/p of billroth II procedure, who presents with nausea, vomiting, regurgitation weight loss   Clinical Impression   Pt reports he was independent with ADLs PTA. Pt reports that he lives at home with his wife who is disabled and cannot assist physically. Pt reports that his family lives out of town. Discussed short term SNF stay with pt, pt agreeable. Due to current functional status and decreased caregiver support, recommending short term SNF for further rehab in order to maximize independence and safety with ADLs and functional mobility. Pt would benefit from continued OT in order to increase independence with functional transfers and LB ADLs.     Follow Up Recommendations  SNF;Supervision/Assistance - 24 hour    Equipment Recommendations  Other (comment) (TBD- defer to next venue )    Recommendations for Other Services PT consult     Precautions / Restrictions Precautions Precautions: Fall Restrictions Weight Bearing Restrictions: Yes RLE Weight Bearing: Weight bearing as tolerated      Mobility Bed Mobility Overal bed mobility: Needs Assistance Bed Mobility: Supine to Sit;Sit to Supine     Supine to sit: Mod assist;HOB elevated Sit to supine: Mod assist;HOB elevated   General bed mobility comments: mod A to guide RLE, assist given at hips to scoot to EOB  Transfers Overall transfer level: Needs assistance Equipment used: Rolling Walker (2 wheeled) Transfers: Sit to/from Stand Sit to Stand: Mod assist         General transfer comment: Verbal cues for hand plaement. Sit <> stand from EOB x 1. Attempted stand pivot transfer to  chair, pt unable to take step to complete stand pivot transfer     Balance Overall balance assessment: Needs assistance         Standing balance support: Bilateral upper extremity supported Standing balance-Leahy Scale: Poor Standing balance comment: RW for support                            ADL Overall ADL's : Needs assistance/impaired                                       General ADL Comments: No family presnt during OT eval. Pt currently max assist for LB ADLs, mod assist for sit <> stand but unable to complete stand pivot transfer from EOB to chair. Pt with increased shortness of breath with activity. Discussed option of short term SNF for continued rehab before going home, pt agreeable.      Vision     Perception     Praxis      Pertinent Vitals/Pain Pain Assessment: Faces Faces Pain Scale: Hurts even more Pain Location: R hip Pain Intervention(s): Limited activity within patient's tolerance;Monitored during session;Repositioned     Hand Dominance Right   Extremity/Trunk Assessment Upper Extremity Assessment Upper Extremity Assessment: Generalized weakness   Lower Extremity Assessment Lower Extremity Assessment: Defer to PT evaluation       Communication Communication Communication: HOH   Cognition Arousal/Alertness: Awake/alert Behavior During Therapy: WFL for tasks assessed/performed Overall Cognitive Status: Within Functional Limits for tasks  assessed                     General Comments       Exercises       Shoulder Instructions      Home Living Family/patient expects to be discharged to:: Private residence Living Arrangements: Spouse/significant other (Pt reports spouse is disabled and cannot assit physically) Available Help at Discharge: Other (Comment) (none) Type of Home: House       Home Layout: One level     Bathroom Shower/Tub: Walk-in shower;Door   [employer] Toilet: Standard Bathroom  Accessibility: Yes How Accessible: Accessible via Walker Home Equipment: Walker - 2 wheels;Cane - single point;Bedside commode;Shower seat - built in          Prior Functioning/Environment Level of Independence: Independent             OT Diagnosis: Generalized weakness;Acute pain   OT Problem List: Decreased strength;Decreased range of motion;Decreased activity tolerance;Impaired balance (sitting and/or standing);Decreased safety awareness;Decreased knowledge of use of DME or AE;Decreased knowledge of precautions;Pain   OT Treatment/Interventions: Self-care/ADL training;DME and/or AE instruction;Patient/family education    OT Goals(Current goals can be found in the care plan section) Acute Rehab OT Goals Patient Stated Goal: none stated OT Goal Formulation: With patient Time For Goal Achievement: 06/14/15 Potential to Achieve Goals: Good ADL Goals Pt Will Perform Grooming: with min guard assist;standing Pt Will Perform Lower Body Bathing: with min guard assist;sit to/from stand Pt Will Perform Lower Body Dressing: with min guard assist;sit to/from stand Pt Will Transfer to Toilet: with min guard assist;stand pivot transfer;bedside commode  OT Frequency: Min 2X/week   Barriers to D/C: Decreased caregiver support          Co-evaluation              End of Session Equipment Utilized During Treatment: Gait belt;Rolling Walker;Oxygen  Activity Tolerance: Patient limited by pain Patient left: in bed;with call bell/phone within reach;with SCD's reapplied   Time: 1441-1511 OT Time Calculation (min): 30 min Charges:  OT General Charges $OT Visit: 1 Procedure OT Evaluation $Initial OT Evaluation Tier I: 1 Procedure OT Treatments $Self Care/Home Management : 8-22 mins G-Codes:     Gaye Alken M.S., OTR/L Pager: [phone]  05/31/2015, 3:24 PM

## 2015-05-31 NOTE — Progress Notes (Signed)
     Subjective:  POD#1 IM nail of the R hip. Patient reports pain as mild to moderate.  Resting comfortably in bed this morning.  Will see how he does with mobilization with PT, but anticipate that he will need SNF at discharge.  Objective:   VITALS:   Filed Vitals:   05/30/15 2105 05/31/15 0535 05/31/15 0614 05/31/15 0815  BP:   104/51   Pulse:   80   Temp:   98.7 F (37.1 C)   TempSrc:      Resp:   20   Height:      Weight:      SpO2: 98% 94% 96% 97%    Neurologically intact ABD soft Neurovascular intact Sensation intact distally Intact pulses distally Dorsiflexion/Plantar flexion intact Incision: scant drainage   Lab Results  Component Value Date   WBC 9.8 05/31/2015   HGB 9.6* 05/31/2015   HCT 30.0* 05/31/2015   MCV 87.5 05/31/2015   PLT 239 05/31/2015   BMET    Component Value Date/Time   NA 138 05/31/2015 0521   K 3.7 05/31/2015 0521   CL 108 05/31/2015 0521   CO2 25 05/31/2015 0521   GLUCOSE 99 05/31/2015 0521   BUN 9 05/31/2015 0521   CREATININE 0.95 05/31/2015 0521   CREATININE 1.48* 06/17/2013 1018   CALCIUM 7.5* 05/31/2015 0521   GFRNONAA >60 05/31/2015 0521   GFRAA >60 05/31/2015 0521     Assessment/Plan: 1 Day Post-Op   Principal Problem:   Fracture, intertrochanteric, right femur (HCC) Active Problems:   COPD (chronic obstructive pulmonary disease) (HCC)   CAD (coronary artery disease)   HTN (hypertension)   PVD (peripheral vascular disease) (HCC)   CKD (chronic kidney disease), stage III   Severe protein-calorie malnutrition [name](Gomez: less than 60% of standard weight) (HCC)   Chronic respiratory failure with hypoxia (HCC)   Pulmonary nodule   Acute blood loss anemia   Urinary retention   Pleural effusion   Up with therapy WBAT in the RLE ASA 325mg  daily for dvt prophylaxis   Sherra Kimmons Marie 05/31/2015, 9:26 AM Cell [phone]

## 2015-05-31 NOTE — Evaluation (Signed)
Physical Therapy Evaluation Patient Details Name: Alec Walker MRN: [id number] DOB: 09-Mar-1927 Today's Date: 05/31/2015   History of Present Illness  81 y.o. male admitted to [facility] on 05/29/15 for fall with R hip fx.  Pt now s/p IM Nail.  Pt with significant PMHx of COPD (on 3 L O2 [place] at home), CAD, HTN, syncope, CAD, NSVT, stroke, SOB, anxiety/depression, and CKD III.  Clinical Impression  Pt was able to get OOB to the chair today, but does present with significant weakness, pain, and immobility.  He will be unable to return home where he is the primary caregiver for his wife.  He will need SNF level rehab at discharge and is interested in some help to see if he can get his wife placed at the same rehab center.   PT to follow acutely for deficits listed below.       Follow Up Recommendations SNF    Equipment Recommendations  None recommended by PT    Recommendations for Other Services   NA    Precautions / Restrictions Precautions Precautions: Fall Precaution Comments: recent h/o falls, recent admission for weakness Restrictions Weight Bearing Restrictions: Yes RLE Weight Bearing: Weight bearing as tolerated      Mobility  Bed Mobility Overal bed mobility: Needs Assistance Bed Mobility: Supine to Sit     Supine to sit: Mod assist;HOB elevated Sit to supine: Mod assist;HOB elevated   General bed mobility comments: Mod assist to help progress right leg over to EOB and support trunk during transition to sitting. Extra time needed to complete task.  Verbal cues for 1/2 bridge technique and hand placement and sequencing.   Transfers Overall transfer level: Needs assistance Equipment used: Rolling walker (2 wheeled) Transfers: Sit to/from [employer] Sit to Stand: Mod assist;From elevated surface Stand pivot transfers: Mod assist;From elevated surface       General transfer comment: Mod assist to support trunk during transitions.  Verbal cues for safe  hand placement and to weight shift off of painful side.  Pt able to take some pivotal steps around with RW to recliner chair and lower slowly to sit.    Ambulation/Gait             General Gait Details: unable at this time, would need a second person to ensure safety  Stairs                   Balance Overall balance assessment: Needs assistance Sitting-balance support: Feet supported;Bilateral upper extremity supported Sitting balance-Leahy Scale: Fair Sitting balance - Comments: bil upper extremity prop, reporting fatigue EOB   Standing balance support: Bilateral upper extremity supported Standing balance-Leahy Scale: Poor Standing balance comment: RW and external assist in standing.                              Pertinent Vitals/Pain Pain Assessment: Faces Faces Pain Scale: Hurts little more Pain Location: right hip with bed mobility and standing, none with exercises Pain Intervention(s): Limited activity within patient's tolerance    Home Living Family/patient expects to be discharged to:: Private residence Living Arrangements: Spouse/significant other (Pt reports spouse is disabled and cannot assit physically) Available Help at Discharge: Other (Comment) (none) Type of Home: House       Home Layout: One level Home Equipment: Walker - 2 wheels;Cane - single point;Bedside commode;Shower seat - built in      Prior Function Level of Independence: Independent  Hand Dominance   Dominant Hand: Right    Extremity/Trunk Assessment   Upper Extremity Assessment: Defer to OT evaluation           Lower Extremity Assessment: RLE deficits/detail RLE Deficits / Details: right leg with normal post op pain and weakness, ankle 3/5, knee 2+/5, hip 2+/5 LLE Deficits / Details: generalized weakness throughout.   Cervical / Trunk Assessment: Normal  Communication   Communication: HOH  Cognition Arousal/Alertness:  Awake/alert Behavior During Therapy: WFL for tasks assessed/performed Overall Cognitive Status: Within Functional Limits for tasks assessed                         Exercises Total Joint Exercises Ankle Circles/Pumps: AROM;Both;10 reps;Supine Quad Sets: AROM;Both;10 reps;Supine Heel Slides: AAROM;Right;AROM;Left;10 reps;Supine Hip ABduction/ADduction: AROM;AAROM;Right;Left;10 reps;Supine      Assessment/Plan    PT Assessment Patient needs continued PT services  PT Diagnosis Difficulty walking;Abnormality of gait;Generalized weakness;Acute pain   PT Problem List Decreased strength;Decreased range of motion;Decreased activity tolerance;Decreased balance;Decreased mobility;Decreased knowledge of use of DME;Pain;Cardiopulmonary status limiting activity  PT Treatment Interventions DME instruction;Gait training;Functional mobility training;Therapeutic activities;Therapeutic exercise;Neuromuscular re-education;Balance training;Patient/family education;Manual techniques;Modalities   PT Goals (Current goals can be found in the Care Plan section) Acute Rehab PT Goals Patient Stated Goal: to get better so he can get back home to his wife PT Goal Formulation: With patient Time For Goal Achievement: 06/14/15 Potential to Achieve Goals: Good    Frequency Min 3X/week   Barriers to discharge Decreased caregiver support pt is primary caregiver for his wife who is "crippled"       End of Session Equipment Utilized During Treatment: Oxygen Activity Tolerance: Patient limited by pain;Patient limited by fatigue Patient left: in chair;with call bell/phone within reach;with chair alarm set Nurse Communication: Mobility status         Time: [id number] PT Time Calculation (min) (ACUTE ONLY): 39 min   Charges:   PT Evaluation $Initial PT Evaluation Tier I: 1 Procedure PT Treatments $Therapeutic Exercise: 8-22 mins $Therapeutic Activity: 8-22 mins        Rahsaan Weakland B. Rion Schnitzer, PT,  DPT [phone]   05/31/2015, 5:18 PM

## 2015-05-31 NOTE — Evaluation (Signed)
Clinical/Bedside Swallow Evaluation Patient Details  Name: Alec Walker MRN: [id number] Date of Birth: Mar 06, 1927  Today's Date: 05/31/2015 Time: SLP Start Time (ACUTE ONLY): 1142 SLP Stop Time (ACUTE ONLY): 1153 SLP Time Calculation (min) (ACUTE ONLY): 11 min  Past Medical History:  Past Medical History  Diagnosis Date  . COPD (chronic obstructive pulmonary disease) (HCC)   . CAD (coronary artery disease)     a.  nonobstructive CAD by cath 1999. b. Myoview 04/2012: fixed inferior and inferoseptal bowel attenuation artifact, no reversible ischemia. EF 62%. Low risk scan.  Alec Walker. HTN (hypertension)   . PUD (peptic ulcer disease)     a. s/p surgery 1976.  Alec Walker. Syncope     2012  . Carotid artery disease (HCC) 03/22/13    a. 50-69% BICA by duplex 03/2013, repeat needed 11/2013.  Alec Walker. PAD (peripheral artery disease) (HCC)     a. prior stenting of RCIA 1999. b. history of 90% vertebral artery narrowing, 40% subclavian artery narrowing. c. Diagnostic PV angio 08/2013 for progressive claudication - will ultimately need endarterectomy/patch angio on bilat CFA stenosis; will also need atherectomy/PT/stenting to REIA.   Alec Walker. HLD (hyperlipidemia)   . Chronic respiratory failure (HCC)     a. On home O2.  Alec Walker. NSVT (nonsustained ventricular tachycardia) (HCC)     a. Brief NSVT (6b) during 08/2013 adm.  . Stroke (HCC)   . Shortness of breath   . On home oxygen therapy     "3L; I keep it on all the time" (05/29/2015)  . Pneumonia 2015  . History of blood transfusion     "don't know" what it was related to  . GERD (gastroesophageal reflux disease)   . Stomach ulcer   . Arthritis     "all over"  . Anxiety   . Depression   . CKD (chronic kidney disease), stage III    Past Surgical History:  Past Surgical History  Procedure Laterality Date  . Cataract extraction w/ intraocular lens  implant, bilateral Bilateral   . Gastrectomy    . Transurethral resection of prostate    . Coronary angioplasty with stent  placement      stent in 2003; history of right common iliac artery stent hx  . Endarterectomy femoral Bilateral 11/11/2013    Procedure: BILATERAL FEMORAL ENDARTERECTOMIES WITH BILATERAL PATCH ANGIOPLASTIES;  Surgeon: Nada LibmanVance W Brabham, MD;  [facility];  Service: Vascular;  Laterality: Bilateral;  . Lower extremity angiogram N/A 09/01/2013    Procedure: LOWER EXTREMITY ANGIOGRAM;  Surgeon: Runell GessJonathan J Berry, MD;  [facility];  Service: Cardiovascular;  Laterality: N/A;  . Esophagogastroduodenoscopy N/A 05/23/2015    Procedure: ESOPHAGOGASTRODUODENOSCOPY (EGD);  Surgeon: Carman ChingJames Edwards, MD;  [facility];  Service: Endoscopy;  Laterality: N/A;  . Tonsillectomy    . Appendectomy    . Excisional hemorrhoidectomy     HPI:  81 year old male admitted on 05/29/2015 with a hip fracture. PMH: COPD, CAD, HTN, PUD, HLD, chronic respiratory failure, CVA, SOB, PNA, GERD, anxiety, depression, CKD. Pt has had multiple clinical swallowing evaluations in the past, all of which report oropharyngeal function WFL but with suspected esophageal component.   Assessment / Plan / Recommendation Clinical Impression  Pt's oropharyngeal swallow appears WFL, although he does report eating more of a Dys 2 consistency at home. Pt shares that he has frequent regurgitation of foods and while this was not observed, he did have some oral expectoration of secretions upon SLP arrival. Continue to suspect that pt's highest  aspiration risk occurs post-prandially given likely esophageal dysphagia. Will advance diet to Dys 2 textures and thin liquids - MD may wish to address esophageal involvement.    Aspiration Risk  Mild    Diet Recommendation Dysphagia 2 (Fine chop);Thin   Medication Administration: Whole meds with liquid Compensations: Small sips/bites;Slow rate    Other  Recommendations Recommended Consults: Consider GI evaluation;Consider esophageal assessment Oral Care Recommendations: Oral care BID     Follow Up Recommendations   n/a    Pertinent Vitals/Pain n/a    SLP Swallow Goals     Swallow Study Prior Functional Status       General Other Pertinent Information: 81 year old male admitted on 05/29/2015 with a hip fracture. PMH: COPD, CAD, HTN, PUD, HLD, chronic respiratory failure, CVA, SOB, PNA, GERD, anxiety, depression, CKD. Pt has had multiple clinical swallowing evaluations in the past, all of which report oropharyngeal function WFL but with suspected esophageal component. Type of Study: Bedside swallow evaluation Previous Swallow Assessment: see HPI Diet Prior to this Study: Thin liquids Temperature Spikes Noted: No Respiratory Status: Supplemental O2 delivered via (comment) ([place]) History of Recent Intubation: No Behavior/Cognition: Alert;Cooperative;Pleasant mood Oral Cavity - Dentition: Poor condition;Missing dentition Self-Feeding Abilities: Able to feed self Patient Positioning: Upright in bed Baseline Vocal Quality: Normal    Oral/Motor/Sensory Function Overall Oral Motor/Sensory Function: Appears within functional limits for tasks assessed   Ice Chips Ice chips: Not tested   Thin Liquid Thin Liquid: Within functional limits Presentation: Self Fed;Straw    Nectar Thick Nectar Thick Liquid: Not tested   Honey Thick Honey Thick Liquid: Not tested   Puree Puree: Within functional limits Presentation: Self Fed;Spoon   Solid   Solid: Within functional limits Presentation: Self Fed      Maxcine Ham, M.A. CCC-SLP [phone]  Maxcine Ham 05/31/2015,12:04 PM

## 2015-06-01 DIAGNOSIS — I10 Essential (primary) hypertension: Secondary | ICD-10-CM

## 2015-06-01 DIAGNOSIS — D62 Acute posthemorrhagic anemia: Secondary | ICD-10-CM

## 2015-06-01 DIAGNOSIS — N183 Chronic kidney disease, stage 3 (moderate): Secondary | ICD-10-CM

## 2015-06-01 DIAGNOSIS — R339 Retention of urine, unspecified: Secondary | ICD-10-CM

## 2015-06-01 LAB — CBC
HCT: 27 % — ABNORMAL LOW (ref 39.0–52.0)
Hemoglobin: 8.8 g/dL — ABNORMAL LOW (ref 13.0–17.0)
MCH: 28.9 pg (ref 26.0–34.0)
MCHC: 32.6 g/dL (ref 30.0–36.0)
MCV: 88.8 fL (ref 78.0–100.0)
PLATELETS: 216 10*3/uL (ref 150–400)
RBC: 3.04 MIL/uL — AB (ref 4.22–5.81)
RDW: 15.3 % (ref 11.5–15.5)
WBC: 9.1 10*3/uL (ref 4.0–10.5)

## 2015-06-01 MED ORDER — ENOXAPARIN SODIUM 30 MG/0.3ML ~~LOC~~ SOLN: 30.0000 mg | SUBCUTANEOUS | Status: DC

## 2015-06-01 MED ORDER — ENOXAPARIN SODIUM 30 MG/0.3ML ~~LOC~~ SOLN
30.0000 mg | Freq: Two times a day (BID) | SUBCUTANEOUS | Status: DC
  Administered 2015-06-01 – 2015-06-02 (×2): 30 mg via SUBCUTANEOUS
  Filled 2015-06-01 (×2): qty 0.3

## 2015-06-01 NOTE — Clinical Social Work Placement (Signed)
   CLINICAL SOCIAL WORK PLACEMENT  NOTE  Date:  06/01/2015  Patient Details  Name: Alec Walker MRN: [id number] Date of Birth: 1926/10/12  Clinical Social Work is seeking post-discharge placement for this patient at the Skilled  Nursing Facility level of care (*CSW will initial, date and re-position this form in  chart as items are completed):  Yes   Patient/family provided with [facility] Clinical Social Work Department's list of facilities offering this level of care within the geographic area requested by the patient (or if unable, by the patient's family).  Yes   Patient/family informed of their freedom to choose among providers that offer the needed level of care, that participate in Medicare, Medicaid or managed care program needed by the patient, have an available bed and are willing to accept the patient.  Yes   Patient/family informed of 's ownership interest in [facility] and [facility], as well as of the fact that they are under no obligation to receive care at these facilities.  PASRR submitted to EDS on  (n/a)     PASRR number received on  (n/a)     Existing PASRR number confirmed on 06/01/15     FL2 transmitted to all facilities in geographic area requested by pt/family on 06/01/15     FL2 transmitted to all facilities within larger geographic area on  (n/a)     Patient informed that his/her managed care company has contracts with or will negotiate with certain facilities, including the following:   (yes, [facility])     Yes   Patient/family informed of bed offers received.  [facility]     Physician recommends and patient chooses bed at  (n/a)    [facility]  .  Patient to be transferred to facility by [facility]AR     Patient family notified on   of transfer.  Name of family member notified:        PHYSICIAN Please sign FL2, Please sign DNR      Additional Comment:    _______________________________________________ Rod MaeVaughn, Salvadore Valvano S, LCSW 06/01/2015, 12:20 PM

## 2015-06-01 NOTE — Clinical Social Work Note (Signed)
Clinical Social Work Assessment  Patient Details  Name: Alec HorsfallJohnie E Walker MRN: [id number] Date of Birth: 05-30-1927  Date of referral:  06/01/15               Reason for consult:  Facility Placement, Discharge Planning                Permission sought to share information with:  Facility Medical sales representativeContact Representative, Family Supports Permission granted to share information::  Yes, Verbal Permission Granted  Name::     Beaulah Corinheresa Wilson  Ag[facility]  [facility] (preference: [facility])  Relationship::  Daughter  Contact Information:  [phone]  Housing/Transportation Living arrangements for the past 2 months:  Skilled Nursing Facility Source of Information:  Adult Children Patient Interpreter Needed:  None Criminal Activity/Legal Involvement Pertinent to Current Situation/Hospitalization:  No - Comment as needed Significant Relationships:  Adult Children Lives with:  Spouse Do you feel safe going back to the place where you live?  No (High fall risk.) Need for family participation in patient care:  Yes (Comment) (Patient's daughter active in patient's care.)  Care giving concerns:  Patient's daughter assisting with patient's discharge planning. Patient's daughter expressed no concerns at this time.   Social Worker assessment / plan:  CSW received referral for possible SNF placement at time of discharge. CSW spoke with patient's daughter, who is assisting patient with discharge planning. Per patient's daughter, patient and patient's family agreeable to PT recommendation for SNF placement at time of discharge. Patient's daughter informed CSW patient's [facility]. CSW to continue to follow and assist with discharge planning needs.  Employment status:  Retired Health and safety inspectornsurance information:  Medicare PT Recommendations:  Skilled Nursing Facility Information / Referral to community resources:  Skilled Nursing Facility  Patient/Family's Response to care:   Patient's family understanding and agreeable to CSW plan of care.  Patient/Family's Understanding of and Emotional Response to Diagnosis, Current Treatment, and Prognosis:  Patient's family understanding and agreeable to CSW plan of care.  Emotional Assessment Appearance:  Appears stated age Attitude/Demeanor/Rapport:  Other (Pleasant.) Affect (typically observed):  Accepting, Appropriate, Pleasant Orientation:  Oriented to Self, Oriented to Place, Oriented to  Time, Oriented to Situation Alcohol / Substance use:  Not Applicable Psych involvement (Current and /or in the community):  No (Comment) (Not appropriate on this admission.)  Discharge Needs  Concerns to be addressed:  No discharge needs identified Readmission within the last 30 days:  No Current discharge risk:  None Barriers to Discharge:  No Barriers Identified   Rod MaeVaughn, Kamyia Thomason S, LCSW 06/01/2015, 12:13 PM [phone]

## 2015-06-01 NOTE — Discharge Planning (Signed)
Bed available at [facility] over weekend if patient medical stable for discharge.  Patient's daughter aware and agreeable to SNF placement.  Per patient's daughter, patient's daughter to discuss final discharge plan with patient on 06/01/2015.  Weekend CSW: If patient medically stable over weekend, please call Janie [facility] admissions [facility]. Patient will be transported via EMS.  Marcelline Deistmily Truett Mcfarlan, [place] - [phone] Clinical Social Work Department Orthopedics [phone]([id number]) and Surgical [phone]([id number])

## 2015-06-01 NOTE — Progress Notes (Signed)
     Subjective:  POD#2 IM nail of the R hip. Patient reports pain as mild.  Resting comfortably in bed this morning.  No family at the bedside.  Patient worked with PT yesterday.  Recommending SNF at discharge.    Objective:   VITALS:   Filed Vitals:   05/31/15 2035 05/31/15 2113 05/31/15 2248 06/01/15 0559  BP: 145/58  133/81 138/58  Pulse: 77  86 78  Temp: 98 F (36.7 C)   98.4 F (36.9 C)  TempSrc: Oral   Oral  Resp: 18   19  Height:      Weight:      SpO2: 97% 97%  98%    Neurologically intact ABD soft Neurovascular intact Sensation intact distally Intact pulses distally Dorsiflexion/Plantar flexion intact Incision: dressing C/D/I   Lab Results  Component Value Date   WBC 9.1 06/01/2015   HGB 8.8* 06/01/2015   HCT 27.0* 06/01/2015   MCV 88.8 06/01/2015   PLT 216 06/01/2015   BMET    Component Value Date/Time   NA 138 05/31/2015 0521   K 3.7 05/31/2015 0521   CL 108 05/31/2015 0521   CO2 25 05/31/2015 0521   GLUCOSE 99 05/31/2015 0521   BUN 9 05/31/2015 0521   CREATININE 0.95 05/31/2015 0521   CREATININE 1.48* 06/17/2013 1018   CALCIUM 7.5* 05/31/2015 0521   GFRNONAA >60 05/31/2015 0521   GFRAA >60 05/31/2015 0521     Assessment/Plan: 2 Days Post-Op   Principal Problem:   Fracture, intertrochanteric, right femur (HCC) Active Problems:   COPD (chronic obstructive pulmonary disease) (HCC)   CAD (coronary artery disease)   HTN (hypertension)   PVD (peripheral vascular disease) (HCC)   CKD (chronic kidney disease), stage III   Severe protein-calorie malnutrition [name](Gomez: less than 60% of standard weight) (HCC)   Chronic respiratory failure with hypoxia (HCC)   Pulmonary nodule   Acute blood loss anemia   Urinary retention   Pleural effusion   Up with therapy WBAT in the RLE ASA 325mg  daily for dvt prophylaxis Ok from ortho standpoint to transfer to SNF when bed available.    Jamian Andujo Hilda LiasMarie 06/01/2015, 7:45 AM Cell [phone]  [phone]

## 2015-06-01 NOTE — Progress Notes (Addendum)
ANTICOAGULATION CONSULT NOTE - Initial  Pharmacy Consult for lovenox Indication: VTE prophylaxis  Allergies  Allergen Reactions  . Ferrous Sulfate Nausea And Vomiting    Patient Measurements: Height: 5\' 10"  (177.8 cm) Weight: 100 lb (45.36 kg) IBW/kg (Calculated) : 73  Vital Signs: Temp: 98.3 F (36.8 C) (10/14 1310) Temp Source: Oral (10/14 1310) BP: 135/75 mmHg (10/14 1310) Pulse Rate: 79 (10/14 1310)  Labs:  Recent Labs  05/30/15 0655 05/31/15 0521 06/01/15 0423  HGB 6.8* 9.6* 8.8*  HCT 22.0* 30.0* 27.0*  PLT 234 239 216  CREATININE 0.94 0.95  --     Estimated Creatinine Clearance: 36.5 mL/min (by C-G formula based on Cr of 0.95).   Assessment: Pharmacy consulted to dose lovenox for vte ppx s/p hip surgery. CrCl~36, wt~45kg. Hg down to 8.8, plt wnl No bleed documented   Goal of Therapy:  Adequate DVT ppx post-surgery Monitor platelets by anticoagulation protocol: Yes   Plan:  Lovenox 30mg  q12h for VTE ppx Mon s/sx bleeding, CBC Pharmacy will sign off consult  Babs BertinHaley Shanessa Hodak, PharmD Clinical Pharmacist Pager [phone] 06/01/2015 1:37 PM

## 2015-06-01 NOTE — Progress Notes (Signed)
Physical Therapy Treatment Patient Details Name: Alec Walker MRN: [id number] DOB: 29-Jan-1927 Today's Date: 06/01/2015    History of Present Illness 81 y.o. male admitted to [facility] on [facility]/29/15 for fall with R hip fx.  Pt now s/p IM Nail.  Pt with significant PMHx of COPD (on 3 L O2 [place] at home), CAD, HTN, syncope, CAD, NSVT, stroke, SOB, anxiety/depression, and CKD III.    PT Comments    Pt was able to attempt a few steps away from the bed today with RW and second person assisting.  He is limited by pain when WB through his right foot and tends to hop step with the RW which is significantly harder on his arms.  Pt continues to be appropriate for SNF level rehab at discharge and may d/c over the weekend.  PT will continue to follow acutely if still here on Monday 11/17.    Follow Up Recommendations  SNF     Equipment Recommendations  None recommended by PT    Recommendations for Other Services   NA     Precautions / Restrictions Precautions Precautions: Fall Precaution Comments: recent h/o falls, recent admission for weakness Restrictions RLE Weight Bearing: Weight bearing as tolerated    Mobility  Bed Mobility Overal bed mobility: Needs Assistance Bed Mobility: Supine to Sit     Supine to sit: HOB elevated;Mod assist     General bed mobility comments: Mod assist to support trunk to get to sitting EOB.  He needed reinforcement of 1/2 bridge techinique and hand placement.   Transfers Overall transfer level: Needs assistance Equipment used: Rolling walker (2 wheeled) Transfers: Sit to/from Stand Sit to Stand: +2 safety/equipment;Mod assist;From elevated surface         General transfer comment: Two person mod assist to support trunk to get to standing and stabilize RW during transition of hands.    Ambulation/Gait Ambulation/Gait assistance: +2 physical assistance;Mod assist Ambulation Distance (Feet): 5 Feet Assistive device: Rolling walker (2 wheeled) Gait  Pattern/deviations: Step-to pattern;Antalgic Gait velocity: decreased Gait velocity interpretation: Below normal speed for age/gender General Gait Details: Pt almost atarted hop to pattern as he was not able to put much weight through his right leg due to pain.  Assist needed at trunk to unweight right leg when stepping with left leg.  chair to follow to encourage increased gait distance, but we did not make it far.           Balance Overall balance assessment: Needs assistance Sitting-balance support: Feet supported;No upper extremity supported Sitting balance-Leahy Scale: Fair     Standing balance support: Bilateral upper extremity supported Standing balance-Leahy Scale: Poor                      Cognition Arousal/Alertness: Awake/alert Behavior During Therapy: WFL for tasks assessed/performed Overall Cognitive Status: Within Functional Limits for tasks assessed                      Exercises Total Joint Exercises Ankle Circles/Pumps: AROM;Both;10 reps;Supine Quad Sets: AROM;Both;10 reps;Supine Heel Slides: AAROM;Right;AROM;Left;10 reps;Supine Hip ABduction/ADduction: AROM;AAROM;Right;Left;10 reps;Supine        Pertinent Vitals/Pain Pain Assessment: Faces Faces Pain Scale: Hurts even more Pain Location: when WB for attempts at gait right leg Pain Descriptors / Indicators: Grimacing;Guarding Pain Intervention(s): Limited activity within patient's tolerance;Monitored during session;Repositioned           PT Goals (current goals can now be found in the care plan section)  Acute Rehab PT Goals Patient Stated Goal: to get better so he can get back home to his wife Progress towards PT goals: Progressing toward goals    Frequency  Min 3X/week    PT Plan Current plan remains appropriate       End of Session Equipment Utilized During Treatment: Oxygen Activity Tolerance: No increased pain;Patient limited by fatigue Patient left: in chair;with call  bell/phone within reach;with chair alarm set     Time: [id number] PT Time Calculation (min) (ACUTE ONLY): 21 min  Charges:  $Gait Training: 8-22 mins                      Alec Walker B. Alec Walker, PT, DPT [phone]   06/01/2015, 5:41 PM

## 2015-06-01 NOTE — Progress Notes (Signed)
[facility] PROGRESS NOTE  Alec Walker ZOX:[id number] DOB: 05-Apr-1927 DOA: 05/29/2015 PCP: Eartha Inch, MD  Summary 81 y.o. male with a past medical history significant for HTN, hyperlipidemia, chronic respiratory failure on 3 L at home secondary to COPD, CAD, CKD stage III, and PUD s/p remote Billroth II and admission 1 week ago for ulcerative esophagitis and ulcer who presents with fall and hip fracture.  Pulmonary consulted preoperatively. Patient had 2 units pRBC preop, IM nail 10/12   Assessment/Plan:  Principal Problem:   Fracture, intertrochanteric, right femur (HCC):  - s/p IM nail. Started on ASA for DVT prophylaxis by ortho.  - Recent EGD had shown severe ulceration at gastrojejunostomy and ulcerative esophagitis. Due to concern for worsening of same, DC aspirin. - Discussed with orthopedic service/Ms. Brittney and okay to switch to prophylactic dose Lovenox.  Active Problems:   Acute blood loss anemia in the setting of chronic anemia:  - Patient's hemoglobin was only 9.  hemoglobin is 6.8 10/12 (after hydration).  B12 ok. S/p 2 units pRBC 10/12.  - Hemoglobin 8.8 today. Follow CBC in a.m. and transfuse if hemoglobin drops to <7 g per DL.    COPD (chronic obstructive pulmonary disease) (HCC), severe/ Chronic respiratory failure with hypoxia (HCC)  - At baseline    CAD (coronary artery disease)  - stable.     HTN (hypertension) - Controlled    PVD (peripheral vascular disease) (HCC)    CKD (chronic kidney disease), stage III - Stable    Severe protein-calorie malnutrition (HCC):  - History of Billroth II for peptic ulcer disease. Had recent admission for ulcerative esophagitis related to chronic NSAID use. Also had ulceration at gastrojejunostomy site. - Discontinued aspirin. Continue PPI.    Pulmonary nodule:  - Pulmonary medicine ordered CT chest. Multiple abnormalities including questionable scarring versus atypical infection versus aspiration.  Likely chronic aspiration per pulmonary. Speech therapy notes probable esophageal dysphagia. Patient denies change in chronic cough. Needs pulmonary follow up after surgery as outpatient> ideally 6 week follow up to repeat CXR and evaluate effusion. Will need repeat CT chest in 3 months to follow up the right upper lobe nodule  Urinary retention:  - Foley catheter placed for bladder scan of 1500 mL. Started flomax. Voiding trial once stronger.   DVT prophylaxis: Subcutaneous Lovenox Code Status:  DO NOT RESUSCITATE  Family Communication:  None Disposition Plan:  SNF when stable  Consultants:  Orthopedics  Pulmonary medicine  Procedures:   IM nail  Foley catheter  EGD 05/23/15: ENDOSCOPIC IMPRESSION: 1. Severe Ulceration at gastrojejunostomy. Multiple biopsies taken 2. Ulcerative Esophagitis. Brushing for fungal smear obtain but suspect this is due to chronic vomiting. RECOMMENDATIONS: we will add Carafate liquid to his medical regimen and wait for biopsies.   Pathology: Diagnosis Stomach, biopsy, Gastric anastomosis site - FIBRINOUS MATERIAL, CONSISTENT WITH SURFACE OF ANASTOMOTIC SITE. - THERE IS NO EVIDENCE OF MALIGNANCY.  Antibiotics:  None  HPI/Subjective: Chronic dyspnea-unchanged. Denies any other complaints.  Objective:  Filed Vitals:   05/31/15 2248 06/01/15 0559 06/01/15 0934 06/01/15 1310  BP: 133/81 138/58  135/75  Pulse: 86 78  79  Temp:  98.4 F (36.9 C)  98.3 F (36.8 C)  TempSrc:  Oral  Oral  Resp:  19  20  Height:      Weight:      SpO2:  98% 99% 99%     Intake/Output Summary (Last 24 hours) at 06/01/15 1340 Last data filed at 06/01/15 1311  Gross per  24 hour  Intake    477 ml  Output    850 ml  Net   -373 ml   Filed Weights   05/29/15 0146  Weight: 45.36 kg (100 lb)    Exam:   General:  Pleasant elderly male sitting comfortably propped up in bed.  Cardiovascular:  Regular rate rhythm without murmurs gallops rubs. No pedal  edema.  Respiratory:  Diminished throughout without wheezes rhonchi or rales. No increased work of breathing.  Abdomen:  Soft nontender nondistended. Normal bowel sounds heard.  Ext:  No clubbing cyanosis or edema  CNS: Alert and oriented 3. No focal deficits.  Basic Metabolic Panel:  Recent Labs Lab 05/29/15 0205 05/30/15 0655 05/31/15 0521  NA 138 142 138  K 3.3* 3.6 3.7  CL 104 108 108  CO2 27 25 25   GLUCOSE 142* 85 99  BUN 12 8 9   CREATININE 0.96 0.94 0.95  CALCIUM 8.1* 7.7* 7.5*   Liver Function Tests:  Recent Labs Lab 05/29/15 0205  AST 14*  ALT 11*  ALKPHOS 92  BILITOT 0.2*  PROT 4.6*  ALBUMIN 1.8*   No results for input(s): LIPASE, AMYLASE in the last 168 hours. No results for input(s): AMMONIA in the last 168 hours. CBC:  Recent Labs Lab 05/29/15 0205 05/30/15 0655 05/31/15 0521 06/01/15 0423  WBC 9.3 9.8 9.8 9.1  NEUTROABS 7.2  --   --   --   HGB 9.0* 6.8* 9.6* 8.8*  HCT 30.1* 22.0* 30.0* 27.0*  MCV 89.9 89.4 87.5 88.8  PLT 331 234 239 216   Cardiac Enzymes: No results for input(s): CKTOTAL, CKMB, CKMBINDEX, TROPONINI in the last 168 hours. BNP (last 3 results)  Recent Labs  01/25/15 1707 05/22/15 0412  BNP 214.1* 286.4*    ProBNP (last 3 results)  Recent Labs  06/10/14 1156  PROBNP 398.9    CBG: No results for input(s): GLUCAP in the last 168 hours.  No results found for this or any previous visit (from the past 240 hour(s)).   Studies: Pelvis Portable  05/30/2015  CLINICAL DATA:  Right hip fracture status post internal fixation. EXAM: PORTABLE PELVIS AND RIGHT FEMUR- 2 VIEWS COMPARISON:  05/30/2015 FINDINGS: Intramedullary rod and screw fixation of intertrochanteric right hip fracture is seen in anatomic alignment. Distal fixation screw is seen across the intramedullary rod in the distal femur. No other fracture or complication identified. Peripheral vascular calcification noted as well as generalized osteopenia.  IMPRESSION: Intramedullary rod and screw fixation of intertrochanteric right hip fracture in anatomic alignment. Electronically Signed   By: Myles RosenthalJohn  Stahl M.D.   On: 05/30/2015 18:07    Scheduled Meds: . antiseptic oral rinse  7 mL Mouth Rinse BID  . atorvastatin  40 mg Oral q1800  . budesonide-formoterol  2 puff Inhalation BID  . enoxaparin (LOVENOX) injection  30 mg Subcutaneous Q24H  . feeding supplement  1 Container Oral TID BM  . feeding supplement (PRO-STAT SUGAR FREE 64)  30 mL Oral Daily  . metoprolol tartrate  25 mg Oral BID  . mirtazapine  30 mg Oral QHS  . pantoprazole  40 mg Oral BID  . sucralfate  1 g Oral TID WC & HS  . tamsulosin  0.4 mg Oral QPC supper  . tiotropium  18 mcg Inhalation Daily  . vitamin B-12  1,000 mcg Oral Daily   Continuous Infusions: . 0.9 % NaCl with KCl 20 mEq / L 10 mL/hr at 06/01/15 [id number]    Time spent:  35 minutes   Alec Mcfadden, MD, FACP, FHM. [facility] Pager [phone]  If 7PM-7AM, please contact night-coverage [email or web address] Password [email or web address] 06/01/2015, 1:55 PM   LOS: 3 days

## 2015-06-02 DIAGNOSIS — E43 Unspecified severe protein-calorie malnutrition: Secondary | ICD-10-CM

## 2015-06-02 DIAGNOSIS — S72141D Displaced intertrochanteric fracture of right femur, subsequent encounter for closed fracture with routine healing: Secondary | ICD-10-CM

## 2015-06-02 LAB — CBC
HCT: 27.6 % — ABNORMAL LOW (ref 39.0–52.0)
HEMOGLOBIN: 8.7 g/dL — AB (ref 13.0–17.0)
MCH: 27.8 pg (ref 26.0–34.0)
MCHC: 31.5 g/dL (ref 30.0–36.0)
MCV: 88.2 fL (ref 78.0–100.0)
PLATELETS: 266 10*3/uL (ref 150–400)
RBC: 3.13 MIL/uL — ABNORMAL LOW (ref 4.22–5.81)
RDW: 15 % (ref 11.5–15.5)
WBC: 6.9 10*3/uL (ref 4.0–10.5)

## 2015-06-02 MED ORDER — ENOXAPARIN SODIUM 30 MG/0.3ML ~~LOC~~ SOLN: 30.0000 mg | Freq: Two times a day (BID) | SUBCUTANEOUS | Status: AC

## 2015-06-02 MED ORDER — ONDANSETRON HCL 4 MG PO TABS
4.0000 mg | ORAL_TABLET | Freq: Three times a day (TID) | ORAL | Status: AC | PRN
Start: 2015-06-02 — End: ?

## 2015-06-02 MED ORDER — BOOST / RESOURCE BREEZE PO LIQD: 1.0000 | Freq: Three times a day (TID) | ORAL | Status: AC

## 2015-06-02 MED ORDER — PRO-STAT SUGAR FREE PO LIQD: 30.0000 mL | Freq: Every day | ORAL | Status: AC

## 2015-06-02 MED ORDER — ACETAMINOPHEN 325 MG PO TABS: 650.0000 mg | ORAL_TABLET | Freq: Four times a day (QID) | ORAL | Status: AC | PRN

## 2015-06-02 MED ORDER — BISACODYL 10 MG RE SUPP: 10.0000 mg | Freq: Every day | RECTAL | Status: AC | PRN

## 2015-06-02 MED ORDER — TAMSULOSIN HCL 0.4 MG PO CAPS: 0.4000 mg | ORAL_CAPSULE | Freq: Every day | ORAL | Status: AC

## 2015-06-02 NOTE — Discharge Summary (Signed)
Physician Discharge Summary  Alec Walker AVW:[id number] DOB: Jul 31, 1927 DOA: 05/29/2015  PCP: Alec Inch, MD  Admit date: 05/29/2015 Discharge date: 06/02/2015  Time spent: Greater than 30 minutes  Recommendations for Outpatient Follow-up:  1. M.D. at SNF in 3-5 days with repeat labs (CBC & BMP) 2. Alec Walker, Pulmonology in 6 weeks with repeat chest x-ray to evaluate effusion. SNF to arrange. 3. Will need repeat CT chest in 3 months to follow-up right upper lobe nodule. 4. Alec Walker, Orthopedics in 10 days for postop follow-up. SNF to arrange.  Discharge Diagnoses:  Principal Problem:   Fracture, intertrochanteric, right femur (HCC) Active Problems:   COPD (chronic obstructive pulmonary disease) (HCC)   CAD (coronary artery disease)   HTN (hypertension)   PVD (peripheral vascular disease) (HCC)   CKD (chronic kidney disease), stage III   Severe protein-calorie malnutrition Alec Walker: less than 60% of standard weight) (HCC)   Chronic respiratory failure with hypoxia (HCC)   Pulmonary nodule   Acute blood loss anemia   Urinary retention   Pleural effusion   Discharge Condition: Improved & Stable  Diet recommendation: Dysphagia 2 diet and thin liquids.  Filed Weights   05/29/15 0146  Weight: 45.36 kg (100 lb)    History of present illness:  81 y.o. male with a past medical history significant for HTN, hyperlipidemia, chronic respiratory failure on 3 L at home secondary to COPD, CAD, CKD stage III, and PUD s/p remote Alec Walker and admission 1 week ago for ulcerative esophagitis and ulcer who presents with fall and hip fracture. Pulmonary consulted preoperatively. Patient had 2 units pRBC preop, IM nail 10/12  Hospital Course:   Principal Problem:  Fracture, intertrochanteric, right femur (HCC):  - s/p IM nail on 05/30/15. Initially was started on on ASA 325 MG daily for DVT prophylaxis by ortho.  - Recent EGD had shown severe ulceration at  gastrojejunostomy and ulcerative esophagitis. Due to concern for worsening of same, discontinued full dose aspirin (patient will remain on enteric-coated aspirin 81 MG daily that he was on prior to admission). - Discussed with orthopedic service yesterday and today with Dr. Eulah Walker and Dr. Thurston Walker who agree with Lovenox for DVT prophylaxis to be used for 14 days postop. - Weightbearing as tolerated and outpatient follow-up with orthopedics in 10 days.  Active Problems:  Acute blood loss anemia in the setting of chronic anemia:  - Patient's hemoglobin was only 9. hemoglobin is 6.8 10/12 (after hydration). B12 ok. S/p 2 units pRBC 10/12.  - Hemoglobin has stabilized in the 8.7-8.8 range over the last 2 days. Follow CBC in a couple of days as outpatient at SNF.   COPD (chronic obstructive pulmonary disease) (HCC), severe/ Chronic respiratory failure with hypoxia (HCC)  - At baseline - As per pulmonology consultation, patient has severe COPD, FEV1 in 2002 was 1.94 L (62% predicted), on 3 L of oxygen at baseline and not in exacerbation. He has done well postoperatively and does not have exacerbation at this time. - Continue Spiriva, Symbicort, prior home oxygen at 3 L/m via nasal cannula.   CAD (coronary artery disease)  - stable. Continue low-dose aspirin, metoprolol and statins.   HTN (hypertension) - Controlled   PVD (peripheral vascular disease) (HCC)   CKD (chronic kidney disease), stage III - Stable   Severe protein-calorie malnutrition (HCC):  - Continue nutritional supplements  Severe ulceration at gastrojejunostomy and ulcerative esophagitis - History of Alec Walker for peptic ulcer disease. Had recent admission for  ulcerative esophagitis related to chronic NSAID use. Also had ulceration at gastrojejunostomy site. Detailed report and pathology as below. - Discontinued full dose aspirin (continues on enteric-coated aspirin 81 MG daily that he was on prior to admission).  Continue PPI.   Pulmonary nodule right upper lobe:  - Pulmonary medicine ordered CT chest. Multiple abnormalities including questionable scarring versus atypical infection versus aspiration. Likely chronic aspiration per pulmonary. Speech therapy notes probable esophageal dysphagia. Patient denies change in chronic cough. Needs pulmonary follow up after surgery as outpatient> ideally 6 week follow up to repeat CXR and evaluate effusion. Will need repeat CT chest in 3 months to follow up the right upper lobe nodule - As per pulmonology, right upper lobe pulmonary nodule favored to represent scarring but needs follow-up.  Urinary retention:  - Foley catheter placed for bladder scan of 1500 mL. Started flomax. Foley catheter discontinued and voiding.  Pleural effusion, left >right - Loculated on left likely due to recurrent and chronic aspiration. Given his frail state, rest and increases was not attempted. - Needs follow-up with pulmonology with chest x-ray in 6 weeks.   Suspect chronic aspiration - Speech therapy evaluated and recommended modified diet as above.  DO NOT RESUSCITATE   Consultants:  Orthopedics  Pulmonary medicine  Procedures:   IM nail  Foley catheter-Discontinued   EGD 05/23/15:-Done during previous admission  ENDOSCOPIC IMPRESSION: 1. Severe Ulceration at gastrojejunostomy. Multiple biopsies taken 2. Ulcerative Esophagitis. Brushing for fungal smear obtain but suspect this is due to chronic vomiting. RECOMMENDATIONS: we will add Carafate liquid to his medical regimen and wait for biopsies.   Pathology: Diagnosis Stomach, biopsy, Gastric anastomosis site - FIBRINOUS MATERIAL, CONSISTENT WITH SURFACE OF ANASTOMOTIC SITE. - THERE IS NO EVIDENCE OF MALIGNANCY.  Antibiotics:  None    Discharge Exam:  Complaints:  Chronic dyspnea-unchanged. No chest pain. Mild operative site pain. Passing flatus. No BM. Tolerating diet.   Filed Vitals:    06/01/15 1310 06/01/15 1933 06/01/15 2121 06/02/15 0654  BP: 135/75  104/54 109/58  Pulse: 79  78 75  Temp: 98.3 F (36.8 C)  97.9 F (36.6 C) 98.2 F (36.8 C)  TempSrc: Oral  Oral Oral  Resp: 20  19 17   Height:      Weight:      SpO2: 99% 98% 96% 98%     General: Pleasant elderly male sitting comfortably propped up in bed.  Cardiovascular: Regular rate rhythm without murmurs gallops rubs. No pedal edema.  Respiratory: Diminished throughout without wheezes rhonchi or rales. No increased work of breathing.  Abdomen: Soft nontender nondistended. Normal bowel sounds heard.  Ext: No clubbing cyanosis or edema  CNS: Alert and oriented 3. No focal deficits.  Discharge Instructions      Discharge Instructions    Call MD for:  difficulty breathing, headache or visual disturbances    Complete by:  As directed      Call MD for:  extreme fatigue    Complete by:  As directed      Call MD for:  hives    Complete by:  As directed      Call MD for:  persistant dizziness or light-headedness    Complete by:  As directed      Call MD for:  persistant nausea and vomiting    Complete by:  As directed      Call MD for:  redness, tenderness, or signs of infection (pain, swelling, redness, odor or green/yellow discharge around incision site)  Complete by:  As directed      Call MD for:  severe uncontrolled pain    Complete by:  As directed      Call MD for:  temperature >100.4    Complete by:  As directed      Discharge instructions    Complete by:  As directed   Diet: Dysphagia 2 diet and thin liquids.     Increase activity slowly    Complete by:  As directed      Weight bearing as tolerated    Complete by:  As directed   Laterality:  right  Extremity:  Lower            Medication List    TAKE these medications        acetaminophen 325 MG tablet  Commonly known as:  TYLENOL  Take 2 tablets (650 mg total) by mouth every 6 (six) hours as needed for mild pain or  headache (or Fever >/= 101). Total acetaminophen dose from all sources not to exceed greater than 4 g per 24-hours.     albuterol 108 (90 BASE) MCG/ACT inhaler  Commonly known as:  PROVENTIL HFA;VENTOLIN HFA  Inhale 2 puffs into the lungs every 4 (four) hours as needed for wheezing or shortness of breath.     albuterol (2.5 MG/3ML) 0.083% nebulizer solution  Commonly known as:  PROVENTIL  INHALE 1 VIAL VIA NEBULIZER EVERY 6 HOURS AS NEEDED FOR WHEEZING     aspirin EC 81 MG tablet  Take 81 mg by mouth at bedtime.     atorvastatin 40 MG tablet  Commonly known as:  LIPITOR  Take 1 tablet (40 mg total) by mouth daily at 6 PM.     bisacodyl 10 MG suppository  Commonly known as:  DULCOLAX  Place 1 suppository (10 mg total) rectally daily as needed for moderate constipation.     budesonide-formoterol 160-4.5 MCG/ACT inhaler  Commonly known as:  SYMBICORT  Inhale 2 puffs into the lungs 2 (two) times daily.     cyanocobalamin 1000 MCG tablet  Take 1 tablet (1,000 mcg total) by mouth daily.     enoxaparin 30 MG/0.3ML injection  Commonly known as:  LOVENOX  Inject 0.3 mLs (30 mg total) into the skin every 12 (twelve) hours. Discontinue after 06/13/15 doses.     feeding supplement (PRO-STAT SUGAR FREE 64) Liqd  Take 30 mLs by mouth daily.     feeding supplement Liqd  Take 1 Container by mouth 3 (three) times daily between meals.     guaiFENesin 600 MG 12 hr tablet  Commonly known as:  MUCINEX  Take 1 tablet (600 mg total) by mouth 2 (two) times daily as needed for cough.     HYDROcodone-acetaminophen 5-325 MG tablet  Commonly known as:  NORCO  Take 1-2 tablets by mouth every 6 (six) hours as needed for moderate pain.     metoprolol tartrate 25 MG tablet  Commonly known as:  LOPRESSOR  Take 25 mg by mouth 2 (two) times daily.     mirtazapine 30 MG tablet  Commonly known as:  REMERON  Take 30 mg by mouth at bedtime.     nitroGLYCERIN 0.4 MG SL tablet  Commonly known as:   NITROSTAT  Place 1 tablet (0.4 mg total) under the tongue every 5 (five) minutes as needed for chest pain.     ondansetron 4 MG tablet  Commonly known as:  ZOFRAN  Take 1 tablet (4 mg total)  by mouth every 8 (eight) hours as needed for nausea or vomiting.     OXYGEN  Inhale 3 L into the lungs daily. 2 liters     pantoprazole 40 MG tablet  Commonly known as:  PROTONIX  Take 40 mg by mouth 2 (two) times daily.     senna-docusate 8.6-50 MG tablet  Commonly known as:  Senokot-S  Take 1 tablet by mouth at bedtime as needed for mild constipation.     simethicone 80 MG chewable tablet  Commonly known as:  MYLICON  Chew 1 tablet (80 mg total) by mouth every 6 (six) hours as needed for flatulence.     sucralfate 1 GM/10ML suspension  Commonly known as:  CARAFATE  Take 10 mLs (1 g total) by mouth 4 (four) times daily -  with meals and at bedtime.     tamsulosin 0.4 MG Caps capsule  Commonly known as:  FLOMAX  Take 1 capsule (0.4 mg total) by mouth daily after supper.     temazepam 7.5 MG capsule  Commonly known as:  RESTORIL  Take 1 capsule (7.5 mg total) by mouth at bedtime as needed for sleep. for sleep     tiotropium 18 MCG inhalation capsule  Commonly known as:  SPIRIVA HANDIHALER  Place 1 capsule (18 mcg total) into inhaler and inhale daily.     Vitamin D3 2000 UNITS Tabs  Take 2,000 mg by mouth at bedtime.       Follow-up Information    Follow up with MURPHY, TIMOTHY D, MD In 10 days.   Specialty:  Orthopedic Surgery   Contact information:   [address] [place] [address] [phone]        The results of significant diagnostics from this hospitalization (including imaging, microbiology, ancillary and laboratory) are listed below for reference.    Significant Diagnostic Studies: Dg Chest 1 View  05/29/2015  CLINICAL DATA:  Right hip pain after a fall. EXAM: CHEST 1 VIEW COMPARISON:  05/21/2015 FINDINGS: Normal heart size and pulmonary  vascularity. Chronic emphysematous changes with scattered fibrosis in the lungs, likely due to chronic bronchitis. Lower lung opacities likely due to atelectasis. No blunting of the right costophrenic angle. Left costophrenic angle is not included within the field of view. No pneumothorax. Degenerative changes in the spine and shoulders. Surgical clips around the GE junction. Calcified and tortuous aorta. IMPRESSION: Emphysematous and chronic bronchitic changes in the lungs with scattered fibrosis. Atelectasis in the lung bases. Electronically Signed   By: Burman Nieves M.D.   On: 05/29/2015 02:43   Dg Chest 2 View  05/04/2015  CLINICAL DATA:  Patient complains of nausea vomiting and weakness. EXAM: CHEST  2 VIEW COMPARISON:  01/25/2015 FINDINGS: Heart size is normal. No pleural effusion or edema. Aortic atherosclerosis. Marked coarsened interstitial markings are noted throughout both lungs compatible with chronic lung disease. Scar noted in the left base. IMPRESSION: No acute cardiopulmonary abnormalities. Diffuse chronic interstitial coarsening noted. Aortic atherosclerosis and left base scarring. Electronically Signed   By: Signa Kell M.D.   On: 05/04/2015 08:20   Ct Head Wo Contrast  05/29/2015  CLINICAL DATA:  Patient lost his balance and fell at home this evening. No loss of consciousness. EXAM: CT HEAD WITHOUT CONTRAST CT CERVICAL SPINE WITHOUT CONTRAST TECHNIQUE: Multidetector CT imaging of the head and cervical spine was performed following the standard protocol without intravenous contrast. Multiplanar CT image reconstructions of the cervical spine were also generated. COMPARISON:  CT  head 09/04/2012 FINDINGS: CT HEAD FINDINGS Diffuse cerebral atrophy. Patchy low-attenuation changes throughout the deep white matter consistent with small vessel ischemia. Mild ventricular dilatation consistent with central atrophy. No mass effect or midline shift. No abnormal extra-axial fluid collections.  Gray-white matter junctions are distinct. Basal cisterns are not effaced. No evidence of acute intracranial hemorrhage. No depressed skull fractures. Visualized paranasal sinuses and mastoid air cells are not opacified. Vascular calcifications. CT CERVICAL SPINE FINDINGS Diffuse bone demineralization. Normal alignment of the cervical spine. Diffuse degenerative changes with narrowed cervical interspaces and endplate hypertrophic changes. Degenerative changes throughout the facet joints. Prominent disc osteophyte complexes at C3-4, C4-5, and C5-6 levels. Bony encroachment upon the neural foramina at multiple levels bilaterally. No vertebral compression deformities. No prevertebral soft tissue swelling. C1-2 articulation appears intact. No focal bone lesion or bone destruction. Vascular calcifications. Scarring in the lung apices with suggestion of a cavitary lesion in the right apex. This is incompletely evaluated on the current study but neoplasm is not excluded. Suggest follow-up with elective CT chest for further evaluation. Small left pleural effusion versus pleural thickening. IMPRESSION: No acute intracranial abnormalities. Chronic atrophy and small vessel ischemic changes. Diffuse bone demineralization. Normal alignment of the cervical spine. Diffuse degenerative changes. No acute displaced fractures identified. Incidental note of a possible lesion in the right lung apex with irregular margins and cavitation. CT chest is suggested to exclude pulmonary neoplasm. Small left pleural effusion versus pleural thickening. Electronically Signed   By: Burman Nieves M.D.   On: 05/29/2015 04:56   Ct Chest Wo Contrast  05/29/2015  CLINICAL DATA:  History of COPD. Recent hip fracture with "Pulmonary nodule" . EXAM: CT CHEST WITHOUT CONTRAST TECHNIQUE: Multidetector CT imaging of the chest was performed following the standard protocol without IV contrast. COMPARISON:  Chest radiograph of 05/29/2015. Chest CT of  09/22/2012. FINDINGS: Mediastinum/Nodes: Bilateral advanced carotid atherosclerosis. advanced aortic and branch vessel atherosclerosis. Mild cardiomegaly with coronary artery atherosclerosis. AP window nodes measure up to 11 mm versus 10 mm on the prior. Hilar regions poorly evaluated without intravenous contrast. Surgical changes at the gastroesophageal junction. Fluid level in the upper esophagus, on image/series 13/201 The lung apices are partially excluded. Lungs/Pleura: Small bilateral pleural effusions. The left-sided pleural effusion has mild loculation, including superiorly and laterally on image/series 25/201. Left lower lobe endobronchial narrowing is likely due to compression or aspiration, including on image/series 34/203. Moderate centrilobular emphysema. Bibasilar dependent airspace disease. Patchy opacities within the right upper and both lower lobes are favored to represent areas of scarring. right upper lobe opacities have a somewhat nodular component, including image 15/203. Also, nodularity at up to 11 mm on image/series 7/203. Within the left upper lobe, peribronchovascular reticular nodular opacities. Mild motion degradation. Upper abdomen: Normal imaged portions of the liver, spleen, stomach, adrenal glands. Musculoskeletal: Osteopenia. A mild to moderate T4 compression deformity is similar. Mild compression deformity at L1 is similar. IMPRESSION: 1. Left upper lobe pulmonary opacities are suspicious for atypical infection or aspiration. 2. Left larger than right pleural effusions with loculation involving the left-sided effusion. 3. Centrilobular emphysema. Bibasilar airspace opacities which are likely atelectasis. Probable scarring involving the right upper lobe. Recommend follow-up CT at 3 months to confirm stability of this appearance. 4. Esophageal air fluid level suggests dysmotility or gastroesophageal reflux. 5. Chronic thoracolumbar compression deformities. 6. Mild motion degradation.  Electronically Signed   By: Jeronimo Greaves M.D.   On: 05/29/2015 12:06   Ct Cervical Spine Wo Contrast  05/29/2015  CLINICAL DATA:  Patient lost his balance and fell at home this evening. No loss of consciousness. EXAM: CT HEAD WITHOUT CONTRAST CT CERVICAL SPINE WITHOUT CONTRAST TECHNIQUE: Multidetector CT imaging of the head and cervical spine was performed following the standard protocol without intravenous contrast. Multiplanar CT image reconstructions of the cervical spine were also generated. COMPARISON:  CT head 09/04/2012 FINDINGS: CT HEAD FINDINGS Diffuse cerebral atrophy. Patchy low-attenuation changes throughout the deep white matter consistent with small vessel ischemia. Mild ventricular dilatation consistent with central atrophy. No mass effect or midline shift. No abnormal extra-axial fluid collections. Gray-white matter junctions are distinct. Basal cisterns are not effaced. No evidence of acute intracranial hemorrhage. No depressed skull fractures. Visualized paranasal sinuses and mastoid air cells are not opacified. Vascular calcifications. CT CERVICAL SPINE FINDINGS Diffuse bone demineralization. Normal alignment of the cervical spine. Diffuse degenerative changes with narrowed cervical interspaces and endplate hypertrophic changes. Degenerative changes throughout the facet joints. Prominent disc osteophyte complexes at C3-4, C4-5, and C5-6 levels. Bony encroachment upon the neural foramina at multiple levels bilaterally. No vertebral compression deformities. No prevertebral soft tissue swelling. C1-2 articulation appears intact. No focal bone lesion or bone destruction. Vascular calcifications. Scarring in the lung apices with suggestion of a cavitary lesion in the right apex. This is incompletely evaluated on the current study but neoplasm is not excluded. Suggest follow-up with elective CT chest for further evaluation. Small left pleural effusion versus pleural thickening. IMPRESSION: No acute  intracranial abnormalities. Chronic atrophy and small vessel ischemic changes. Diffuse bone demineralization. Normal alignment of the cervical spine. Diffuse degenerative changes. No acute displaced fractures identified. Incidental note of a possible lesion in the right lung apex with irregular margins and cavitation. CT chest is suggested to exclude pulmonary neoplasm. Small left pleural effusion versus pleural thickening. Electronically Signed   By: Burman Nieves M.D.   On: 05/29/2015 04:56   Pelvis Portable  05/30/2015  CLINICAL DATA:  Right hip fracture status post internal fixation. EXAM: PORTABLE PELVIS AND RIGHT FEMUR- 2 VIEWS COMPARISON:  05/30/2015 FINDINGS: Intramedullary rod and screw fixation of intertrochanteric right hip fracture is seen in anatomic alignment. Distal fixation screw is seen across the intramedullary rod in the distal femur. No other fracture or complication identified. Peripheral vascular calcification noted as well as generalized osteopenia. IMPRESSION: Intramedullary rod and screw fixation of intertrochanteric right hip fracture in anatomic alignment. Electronically Signed   By: Myles Rosenthal M.D.   On: 05/30/2015 18:07   Dg Chest Port 1 View  05/21/2015  CLINICAL DATA:  Acute onset of cough.  Initial encounter. EXAM: PORTABLE CHEST 1 VIEW COMPARISON:  Chest radiograph performed earlier today at 7:37 p.m. FINDINGS: Increased interstitial markings appear to be chronic in nature, with underlying scarring and emphysematous change. No pleural effusion or pneumothorax is seen. The cardiomediastinal silhouette is normal in size. No acute osseous abnormalities are identified. IMPRESSION: Chronic lung changes noted, with scarring and emphysematous change. No definite acute airspace consolidation seen. Electronically Signed   By: Roanna Raider M.D.   On: 05/21/2015 22:49   Dg Abd Acute W/chest  05/21/2015  CLINICAL DATA:  Nausea and vomiting and dizziness. EXAM: DG ABDOMEN ACUTE W/  1V CHEST COMPARISON:  05/17/2015, 05/03/2015 FINDINGS: Hyperinflation evident with mild interstitial prominence, suspect background COPD/emphysema. Normal heart size and vascularity. Bibasilar atelectasis versus scarring as before. No new superimposed pneumonia, collapse or consolidation. No effusion or pneumothorax. No significant edema pattern. Stable mild left hilar prominence, suspect vascular markings. Atherosclerosis of the  aorta. Bones are osteopenic. Degenerative changes of the spine. Large amount retained barium throughout the entire colon. No obstruction pattern. No significant ileus. Aortoiliac extensive atherosclerosis. No free air on the decubitus view. IMPRESSION: Stable chronic COPD/ emphysema and parenchymal scarring. Large volume of retained barium throughout the colon. Negative for obstruction or free air. Aortoiliac atherosclerosis Osteopenia and degenerative changes of the spine. Electronically Signed   By: Judie Petit.  Shick M.D.   On: 05/21/2015 20:03   Dg Ugi  W/kub  05/17/2015  CLINICAL DATA:  40 lb weight loss. Regurgitation of food. Severe gastroesophageal reflux. EXAM: UPPER GI SERIES WITH KUB TECHNIQUE: After obtaining a scout radiograph a routine upper GI series was performed using thick and thin barium FLUOROSCOPY TIME:  Radiation Exposure Index (as provided by the fluoroscopic device): If the device does not provide the exposure index: Fluoroscopy Time (in minutes and seconds):  2 minutes Number of Acquired Images:  12 COMPARISON:  09/25/2014 FINDINGS: The esophagus has a normal morphology. No evidence for esophagitis, ulceration, stricture or mass. No evidence for hiatal hernia. The patient ingested a 13 mm barium tablet which promptly passed through the esophagus and into the stomach. Evidence of previous gastrectomy with the gastroenteric anastomosis. No evidence for gastric ulceration or stricture. Gastric motility and emptying was unremarkable. No small bowel dilatation. IMPRESSION: 1.  No stricture or mass identified to account for the patient's weight loss and regurgitation. Electronically Signed   By: Signa Kell M.D.   On: 05/17/2015 13:25   Dg C-arm 1-60 Min  05/30/2015  CLINICAL DATA:  Right femur surgery EXAM: RIGHT FEMUR 2 VIEWS; DG C-ARM 61-120 MIN COMPARISON:  None FINDINGS: Four intraoperative views of the right femur submitted. Intra medullary rod and fixation locking screw is noted along right femoral neck. There is anatomic alignment. Fluoroscopy time was 41 seconds.  Please see the operative report. IMPRESSION: Intra medullary rod and metallic nail in right proximal femur with anatomic alignment. Please see the operative report. Electronically Signed   By: Natasha Mead M.D.   On: 05/30/2015 14:36   Dg Hip Unilat With Pelvis 2-3 Views Right  05/29/2015  CLINICAL DATA:  Right hip pain after a fall. EXAM: DG HIP (WITH OR WITHOUT PELVIS) 2-3V RIGHT COMPARISON:  None. FINDINGS: Comminuted inter trochanteric fracture of the right hip with varus angulation of the fracture fragments and displaced lesser trochanteric fragment. No evidence of dislocation of the right hip. Degenerative changes in the lower lumbar spine and both hips. Diffuse bone demineralization. Pelvis appears intact. SI joints and symphysis pubis are not displaced. Vascular calcifications. IMPRESSION: Acute comminuted inter trochanteric fracture of the right hip with varus angulation and displacement of lesser trochanteric fragment. Electronically Signed   By: Burman Nieves M.D.   On: 05/29/2015 03:27   Dg Femur, Min 2 Views Right  05/30/2015  CLINICAL DATA:  Right femur surgery EXAM: RIGHT FEMUR 2 VIEWS; DG C-ARM 61-120 MIN COMPARISON:  None FINDINGS: Four intraoperative views of the right femur submitted. Intra medullary rod and fixation locking screw is noted along right femoral neck. There is anatomic alignment. Fluoroscopy time was 41 seconds.  Please see the operative report. IMPRESSION: Intra medullary  rod and metallic nail in right proximal femur with anatomic alignment. Please see the operative report. Electronically Signed   By: Natasha Mead M.D.   On: 05/30/2015 14:36    Microbiology: No results found for this or any previous visit (from the past 240 hour(s)).   Labs: Basic Metabolic Panel:  Recent Labs Lab 05/29/15 0205 05/30/15 0655 05/31/15 0521  NA 138 142 138  K 3.3* 3.6 3.7  CL 104 108 108  CO2 27 25 25   GLUCOSE 142* 85 99  BUN 12 8 9   CREATININE 0.96 0.94 0.95  CALCIUM 8.1* 7.7* 7.5*   Liver Function Tests:  Recent Labs Lab 05/29/15 0205  AST 14*  ALT 11*  ALKPHOS 92  BILITOT 0.2*  PROT 4.6*  ALBUMIN 1.8*   No results for input(s): LIPASE, AMYLASE in the last 168 hours. No results for input(s): AMMONIA in the last 168 hours. CBC:  Recent Labs Lab 05/29/15 0205 05/30/15 0655 05/31/15 0521 06/01/15 0423 06/02/15 0635  WBC 9.3 9.8 9.8 9.1 6.9  NEUTROABS 7.2  --   --   --   --   HGB 9.0* 6.8* 9.6* 8.8* 8.7*  HCT 30.1* 22.0* 30.0* 27.0* 27.6*  MCV 89.9 89.4 87.5 88.8 88.2  PLT 331 234 239 216 266   Cardiac Enzymes: No results for input(s): CKTOTAL, CKMB, CKMBINDEX, TROPONINI in the last 168 hours. BNP: BNP (last 3 results)  Recent Labs  01/25/15 1707 05/22/15 0412  BNP 214.1* 286.4*    ProBNP (last 3 results)  Recent Labs  06/10/14 1156  PROBNP 398.9    CBG: No results for input(s): GLUCAP in the last 168 hours.    Signed:  Marcellus ScottHONGALGI,Glady Ouderkirk, MD, FACP, FHM. [facility] Pager [phone]  If 7PM-7AM, please contact night-coverage [email or web address] Password [email or web address] 06/02/2015, 11:13 AM

## 2015-06-02 NOTE — Progress Notes (Signed)
Subjective: 3 Days Post-Op Procedure(s) (LRB): INTRAMEDULLARY (IM) NAIL INTERTROCHANTRIC (Right) Patient reports pain as 4 on 0-10 scale.    Objective: Vital signs in last 24 hours: Temp:  [97.9 F (36.6 C)-98.3 F (36.8 C)] 98.2 F (36.8 C) (10/15 0654) Pulse Rate:  [75-79] 75 (10/15 0654) Resp:  [17-20] 17 (10/15 0654) BP: (104-135)/(54-75) 109/58 mmHg (10/15 0654) SpO2:  [96 %-99 %] 98 % (10/15 0654)  Intake/Output from previous day: 10/14 0701 - 10/15 0700 In: 240 [P.O.:240] Out: 400 [Urine:400] Intake/Output this shift: Total I/O In: 240 [P.O.:240] Out: -    Recent Labs  05/31/15 0521 06/01/15 0423 06/02/15 0635  HGB 9.6* 8.8* 8.7*    Recent Labs  06/01/15 0423 06/02/15 0635  WBC 9.1 6.9  RBC 3.04* 3.13*  HCT 27.0* 27.6*  PLT 216 266    Recent Labs  05/31/15 0521  NA 138  K 3.7  CL 108  CO2 25  BUN 9  CREATININE 0.95  GLUCOSE 99  CALCIUM 7.5*   No results for input(s): LABPT, INR in the last 72 hours.  ABD soft Neurovascular intact Sensation intact distally Incision: scant drainage  Assessment/Plan: 3 Days Post-Op Procedure(s) (LRB): INTRAMEDULLARY (IM) NAIL INTERTROCHANTRIC (Right)  Principal Problem:   Fracture, intertrochanteric, right femur (HCC) Active Problems:   COPD (chronic obstructive pulmonary disease) (HCC)   CAD (coronary artery disease)   HTN (hypertension)   PVD (peripheral vascular disease) (HCC)   CKD (chronic kidney disease), stage III   Severe protein-calorie malnutrition [name](Gomez: less than 60% of standard weight) (HCC)   Chronic respiratory failure with hypoxia (HCC)   Pulmonary nodule   Acute blood loss anemia   Urinary retention   Pleural effusion  Up with therapy Discharge to SNF today.  Patient is doing well    Wounds look great    New dressing to be applied by nursing staff prior to discharge today  Pascal LuxSHEPPERSON,Eamon Tantillo J 06/02/2015, 10:33 AM

## 2015-06-02 NOTE — Clinical Social Work Placement (Signed)
   CLINICAL SOCIAL WORK PLACEMENT  NOTE  Date:  06/02/2015  Patient Details  Name: Alec HorsfallJohnie E Mirante MRN: [id number] Date of Birth: 1926-10-18  Clinical Social Work is seeking post-discharge placement for this patient at the Skilled  Nursing Facility level of care (*CSW will initial, date and re-position this form in  chart as items are completed):  Yes   Patient/family provided with [place] Clinical Social Work Department's list of facilities offering this level of care within the geographic area requested by the patient (or if unable, by the patient's family).  Yes   Patient/family informed of their freedom to choose among providers that offer the needed level of care, that participate in Medicare, Medicaid or managed care program needed by the patient, have an available bed and are willing to accept the patient.  Yes   Patient/family informed of [facility]'s ownership interest in [facility] and [facility], as well as of the fact that they are under no obligation to receive care at these facilities.  PASRR submitted to EDS on  (n/a)     PASRR number received on  (n/a)     Existing PASRR number confirmed on 06/01/15     FL2 transmitted to all facilities in geographic area requested by pt/family on 06/01/15     FL2 transmitted to all facilities within larger geographic area on  (n/a)     Patient informed that his/her managed care company has contracts with or will negotiate with certain facilities, including the following:   (yes, [facility]     Yes   Patient/family informed of bed offers received.  [facility]     Physician recommends and patient chooses bed at  (n/a)    [facility] 06[facility]02/15.  Patient to be transferred to facility by [facility]     Patient family notified on 06/02/15 of transfer.  Name of family member notified:   (Pt's dtr, Aggie Cosierheresa via telephone )      PHYSICIAN       Additional Comment:    _______________________________________________ Derenda FennelBashira Dora Simeone, MSW, LCSWA [phone] 06/02/2015 12:14 PM

## 2015-06-02 NOTE — Clinical Social Work Note (Signed)
Clinical Social Worker facilitated patient discharge including contacting patient family and facility to confirm patient discharge plans.  Clinical information faxed to facility and family agreeable with plan.  CSW arranged [facility].  RN to call report prior to discharge.  DC packet prepared and on chart with number for report.  Clinical Social Worker will sign off for now as social work intervention is no longer needed. Please consult us again if new need arises.  Derenda FennelBashira Thurston Brendlinger, MSW, LCSWA [phone] 06/02/2015 12:14 PM

## 2015-06-06 ENCOUNTER — Telehealth: Payer: Self-pay

## 2015-06-06 NOTE — Telephone Encounter (Signed)
Called preferred number, is pt's daughter Aggie Cosierheresa.  [place] and [facility].  Advised I call pt's wife to schedule appt.   Atc, went straight to vm.  Wcb.

## 2015-06-06 NOTE — Telephone Encounter (Signed)
-----   Message from Lupita Leashouglas B McQuaid, MD sent at 05/31/2015 10:42 AM EDT ----- Hi A,  This gentleman will need a follow-up with either myself or Tammy in 6 weeks with a chest x-ray. Reason aspiration pneumonia with pleural effusion and pulmonary nodule.  He will probably be in the hospital for a few more days may need  Wait a few days before making the appointment  Thanks, Kipp BroodBrent

## 2015-06-15 NOTE — Telephone Encounter (Signed)
I called spoke with wife. She reports pt still in [facility] and not sure when he will be released. She did not want to schedule an appt at this time. FYI to Dr. Kendrick FriesMcQuaid

## 2015-06-21 ENCOUNTER — Other Ambulatory Visit: Payer: Self-pay | Admitting: Internal Medicine

## 2015-06-21 DIAGNOSIS — R221 Localized swelling, mass and lump, neck: Secondary | ICD-10-CM

## 2015-06-22 ENCOUNTER — Other Ambulatory Visit: Payer: Medicare Other

## 2015-07-11 ENCOUNTER — Telehealth: Payer: Self-pay | Admitting: Cardiovascular Disease

## 2015-07-11 NOTE — Telephone Encounter (Signed)
Will make dr berry aware 

## 2015-07-11 NOTE — Telephone Encounter (Signed)
Pt expired on 07/18/2015. She just wanted Dr Allyson SabalBerry to know.

## 2015-07-15 NOTE — Telephone Encounter (Signed)
Cala BradfordKimberly, let's send a condolence card next time I'm in the office

## 2015-07-19 DEATH — deceased

## 2015-08-19 DEATH — deceased

## 2016-05-15 IMAGING — DX DG CHEST 2V
2 series · 2 of 2 positions shown · non-contrast
Comparison: 06/13/2014 and 11/07/2013

CLINICAL DATA: Intermittent chest pain for a few weeks with recent
worsening.

EXAM:
CHEST  2 VIEW

[chest pa]
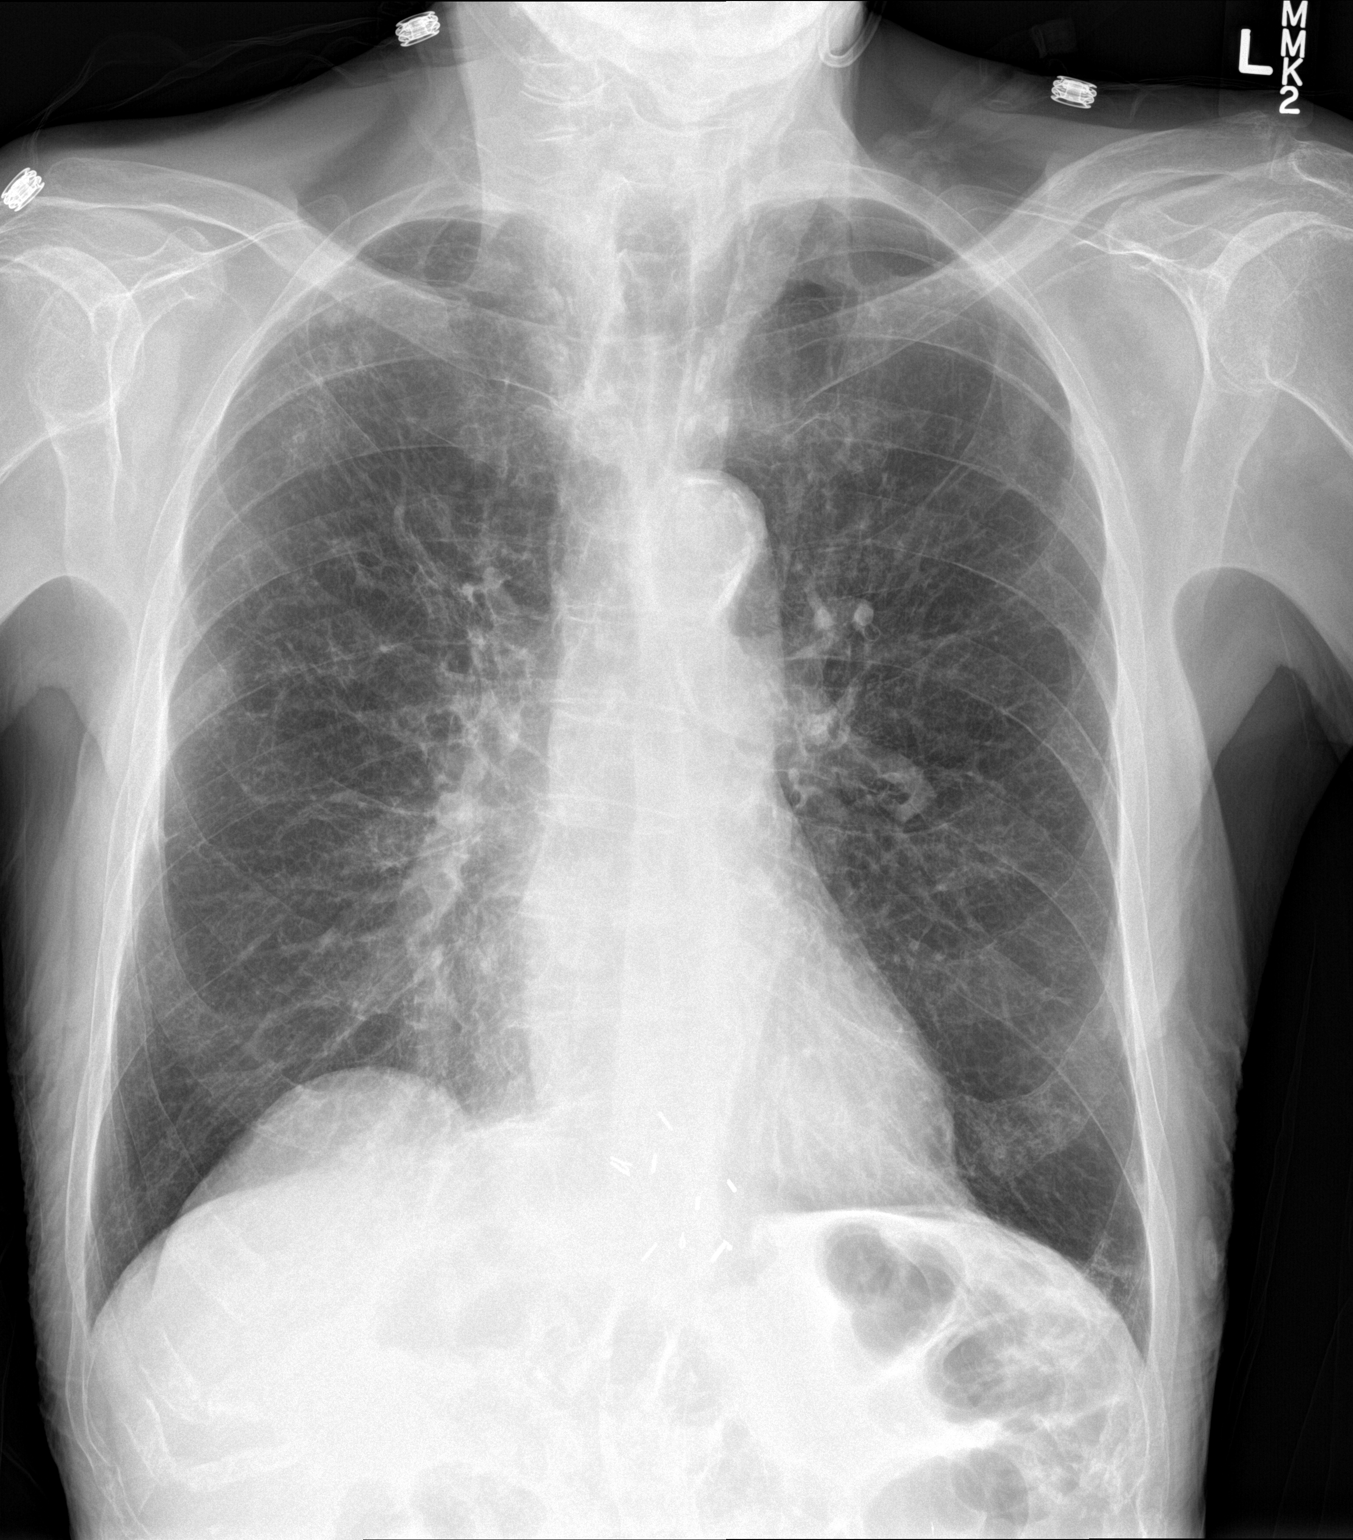

[chest lat]
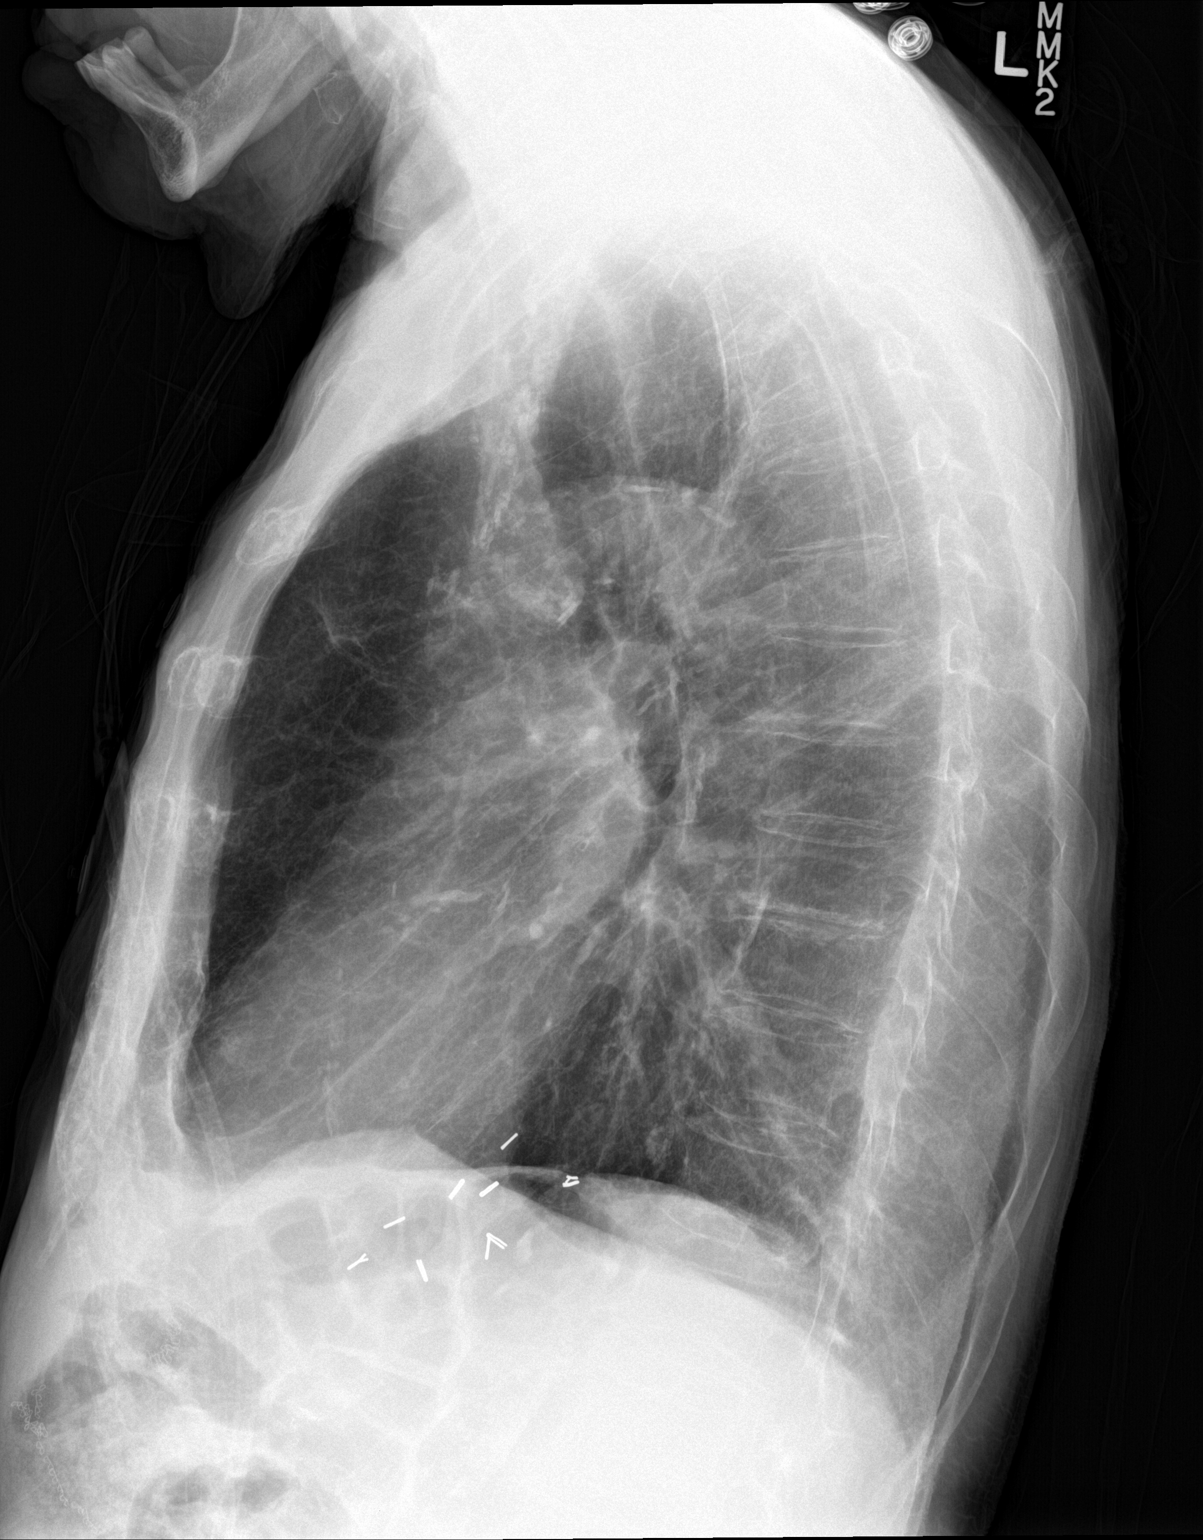

[2 of 2 positions shown; findings below may reference images not displayed]

FINDINGS: Lungs are hyperexpanded without focal consolidation or effusion.
There is flattening hemidiaphragms with diffuse emphysematous
disease. Cardiomediastinal silhouette is within normal. There is
calcified plaque over the thoracic aorta. There are mild
degenerative changes of the spine.
IMPRESSION: No acute cardiopulmonary disease.

COPD.
